# Patient Record
Sex: Female | Born: 1935 | Race: Black or African American | Hispanic: No | Smoking: Never smoker
Health system: Southern US, Community
[De-identification: ages and names within clinical notes are randomized; demographics above are authoritative.]

## PROBLEM LIST (undated history)

## (undated) DIAGNOSIS — I495 Sick sinus syndrome: Secondary | ICD-10-CM

## (undated) DIAGNOSIS — I2510 Atherosclerotic heart disease of native coronary artery without angina pectoris: Secondary | ICD-10-CM

## (undated) DIAGNOSIS — R7303 Prediabetes: Secondary | ICD-10-CM

## (undated) DIAGNOSIS — Z950 Presence of cardiac pacemaker: Secondary | ICD-10-CM

## (undated) DIAGNOSIS — K219 Gastro-esophageal reflux disease without esophagitis: Secondary | ICD-10-CM

## (undated) DIAGNOSIS — E785 Hyperlipidemia, unspecified: Secondary | ICD-10-CM

## (undated) DIAGNOSIS — K225 Diverticulum of esophagus, acquired: Secondary | ICD-10-CM

## (undated) DIAGNOSIS — I10 Essential (primary) hypertension: Secondary | ICD-10-CM

## (undated) DIAGNOSIS — F419 Anxiety disorder, unspecified: Secondary | ICD-10-CM

## (undated) DIAGNOSIS — M1990 Unspecified osteoarthritis, unspecified site: Secondary | ICD-10-CM

## (undated) HISTORY — DX: Sick sinus syndrome: I49.5

## (undated) HISTORY — DX: Essential (primary) hypertension: I10

## (undated) HISTORY — PX: CHOLECYSTECTOMY: SHX55

## (undated) HISTORY — DX: Anxiety disorder, unspecified: F41.9

## (undated) HISTORY — DX: Presence of cardiac pacemaker: Z95.0

## (undated) HISTORY — DX: Unspecified osteoarthritis, unspecified site: M19.90

## (undated) HISTORY — DX: Gastro-esophageal reflux disease without esophagitis: K21.9

## (undated) HISTORY — DX: Atherosclerotic heart disease of native coronary artery without angina pectoris: I25.10

## (undated) HISTORY — DX: Prediabetes: R73.03

## (undated) HISTORY — DX: Diverticulum of esophagus, acquired: K22.5

## (undated) HISTORY — PX: CYSTECTOMY: SUR359

## (undated) HISTORY — DX: Hyperlipidemia, unspecified: E78.5

---

## 2000-10-05 DIAGNOSIS — Z950 Presence of cardiac pacemaker: Secondary | ICD-10-CM

## 2000-10-05 HISTORY — PX: TOTAL ABDOMINAL HYSTERECTOMY: SHX209

## 2000-10-05 HISTORY — PX: PACEMAKER INSERTION: SHX728

## 2000-10-05 HISTORY — DX: Presence of cardiac pacemaker: Z95.0

## 2001-03-30 ENCOUNTER — Encounter: Payer: Self-pay | Admitting: Family Medicine

## 2001-03-30 ENCOUNTER — Ambulatory Visit (HOSPITAL_COMMUNITY): Admission: RE | Admit: 2001-03-30 | Discharge: 2001-03-30 | Payer: Self-pay | Admitting: Family Medicine

## 2001-04-01 ENCOUNTER — Ambulatory Visit (HOSPITAL_COMMUNITY): Admission: RE | Admit: 2001-04-01 | Discharge: 2001-04-01 | Payer: Self-pay | Admitting: Family Medicine

## 2001-04-01 ENCOUNTER — Encounter: Payer: Self-pay | Admitting: Family Medicine

## 2001-05-04 ENCOUNTER — Ambulatory Visit (HOSPITAL_COMMUNITY): Admission: RE | Admit: 2001-05-04 | Discharge: 2001-05-04 | Payer: Self-pay | Admitting: General Surgery

## 2001-06-16 ENCOUNTER — Ambulatory Visit (HOSPITAL_COMMUNITY): Admission: RE | Admit: 2001-06-16 | Discharge: 2001-06-16 | Payer: Self-pay | Admitting: Family Medicine

## 2001-06-16 ENCOUNTER — Encounter: Payer: Self-pay | Admitting: Family Medicine

## 2001-07-05 ENCOUNTER — Ambulatory Visit (HOSPITAL_COMMUNITY): Admission: RE | Admit: 2001-07-05 | Discharge: 2001-07-06 | Payer: Self-pay | Admitting: Family Medicine

## 2001-07-06 ENCOUNTER — Encounter: Payer: Self-pay | Admitting: Family Medicine

## 2001-08-09 ENCOUNTER — Other Ambulatory Visit: Admission: RE | Admit: 2001-08-09 | Discharge: 2001-08-09 | Payer: Self-pay | Admitting: Family Medicine

## 2001-10-05 HISTORY — PX: COLONOSCOPY: SHX174

## 2002-02-24 ENCOUNTER — Encounter: Payer: Self-pay | Admitting: Orthopaedic Surgery

## 2002-02-24 ENCOUNTER — Inpatient Hospital Stay (HOSPITAL_COMMUNITY): Admission: EM | Admit: 2002-02-24 | Discharge: 2002-02-28 | Payer: Self-pay | Admitting: Emergency Medicine

## 2002-02-24 ENCOUNTER — Encounter: Payer: Self-pay | Admitting: Emergency Medicine

## 2002-02-27 ENCOUNTER — Encounter: Payer: Self-pay | Admitting: Family Medicine

## 2002-07-18 ENCOUNTER — Encounter: Payer: Self-pay | Admitting: Family Medicine

## 2002-07-18 ENCOUNTER — Ambulatory Visit (HOSPITAL_COMMUNITY): Admission: RE | Admit: 2002-07-18 | Discharge: 2002-07-18 | Payer: Self-pay | Admitting: Family Medicine

## 2002-07-26 ENCOUNTER — Ambulatory Visit (HOSPITAL_COMMUNITY): Admission: RE | Admit: 2002-07-26 | Discharge: 2002-07-26 | Payer: Self-pay | Admitting: General Surgery

## 2003-02-06 ENCOUNTER — Encounter: Payer: Self-pay | Admitting: Family Medicine

## 2003-02-06 ENCOUNTER — Ambulatory Visit (HOSPITAL_COMMUNITY): Admission: RE | Admit: 2003-02-06 | Discharge: 2003-02-06 | Payer: Self-pay | Admitting: Family Medicine

## 2003-02-28 ENCOUNTER — Ambulatory Visit (HOSPITAL_COMMUNITY): Admission: RE | Admit: 2003-02-28 | Discharge: 2003-02-28 | Payer: Self-pay | Admitting: Family Medicine

## 2003-02-28 ENCOUNTER — Encounter: Payer: Self-pay | Admitting: Family Medicine

## 2003-07-20 ENCOUNTER — Encounter: Payer: Self-pay | Admitting: Family Medicine

## 2003-07-20 ENCOUNTER — Ambulatory Visit (HOSPITAL_COMMUNITY): Admission: RE | Admit: 2003-07-20 | Discharge: 2003-07-20 | Payer: Self-pay | Admitting: Family Medicine

## 2003-10-06 DIAGNOSIS — I2510 Atherosclerotic heart disease of native coronary artery without angina pectoris: Secondary | ICD-10-CM

## 2003-10-06 HISTORY — DX: Atherosclerotic heart disease of native coronary artery without angina pectoris: I25.10

## 2004-01-08 ENCOUNTER — Ambulatory Visit (HOSPITAL_COMMUNITY): Admission: RE | Admit: 2004-01-08 | Discharge: 2004-01-08 | Payer: Self-pay | Admitting: *Deleted

## 2004-06-24 ENCOUNTER — Ambulatory Visit (HOSPITAL_COMMUNITY): Admission: RE | Admit: 2004-06-24 | Discharge: 2004-06-24 | Payer: Self-pay | Admitting: Family Medicine

## 2004-08-13 ENCOUNTER — Inpatient Hospital Stay (HOSPITAL_COMMUNITY): Admission: EM | Admit: 2004-08-13 | Discharge: 2004-08-15 | Payer: Self-pay | Admitting: Emergency Medicine

## 2004-08-13 ENCOUNTER — Ambulatory Visit: Payer: Self-pay | Admitting: *Deleted

## 2004-08-14 ENCOUNTER — Ambulatory Visit: Payer: Self-pay | Admitting: *Deleted

## 2004-08-15 ENCOUNTER — Ambulatory Visit: Payer: Self-pay | Admitting: *Deleted

## 2004-08-15 ENCOUNTER — Ambulatory Visit: Payer: Self-pay | Admitting: Cardiology

## 2004-08-18 ENCOUNTER — Inpatient Hospital Stay (HOSPITAL_BASED_OUTPATIENT_CLINIC_OR_DEPARTMENT_OTHER): Admission: RE | Admit: 2004-08-18 | Discharge: 2004-08-18 | Payer: Self-pay | Admitting: Cardiology

## 2004-08-18 ENCOUNTER — Ambulatory Visit: Payer: Self-pay | Admitting: Cardiovascular Disease

## 2004-08-22 ENCOUNTER — Ambulatory Visit (HOSPITAL_COMMUNITY): Admission: RE | Admit: 2004-08-22 | Discharge: 2004-08-22 | Payer: Self-pay | Admitting: Family Medicine

## 2004-08-22 ENCOUNTER — Ambulatory Visit: Payer: Self-pay | Admitting: Cardiology

## 2004-10-17 ENCOUNTER — Ambulatory Visit: Payer: Self-pay | Admitting: Family Medicine

## 2004-10-25 ENCOUNTER — Ambulatory Visit: Payer: Self-pay | Admitting: Internal Medicine

## 2004-11-20 ENCOUNTER — Ambulatory Visit (HOSPITAL_COMMUNITY): Admission: RE | Admit: 2004-11-20 | Discharge: 2004-11-20 | Payer: Self-pay | Admitting: General Surgery

## 2004-11-20 LAB — HM COLONOSCOPY

## 2004-11-25 ENCOUNTER — Ambulatory Visit: Payer: Self-pay | Admitting: Internal Medicine

## 2004-12-29 ENCOUNTER — Ambulatory Visit: Payer: Self-pay | Admitting: Internal Medicine

## 2005-01-01 ENCOUNTER — Ambulatory Visit: Payer: Self-pay | Admitting: Family Medicine

## 2005-01-27 ENCOUNTER — Ambulatory Visit: Payer: Self-pay | Admitting: Internal Medicine

## 2005-03-06 ENCOUNTER — Ambulatory Visit: Payer: Self-pay | Admitting: Internal Medicine

## 2005-05-01 ENCOUNTER — Ambulatory Visit: Payer: Self-pay | Admitting: Family Medicine

## 2005-05-21 ENCOUNTER — Ambulatory Visit: Payer: Self-pay | Admitting: *Deleted

## 2005-06-24 ENCOUNTER — Ambulatory Visit: Payer: Self-pay | Admitting: Internal Medicine

## 2005-07-21 ENCOUNTER — Ambulatory Visit: Payer: Self-pay | Admitting: Family Medicine

## 2005-07-24 ENCOUNTER — Ambulatory Visit: Payer: Self-pay | Admitting: Internal Medicine

## 2005-08-10 ENCOUNTER — Ambulatory Visit: Payer: Self-pay | Admitting: Family Medicine

## 2005-08-11 ENCOUNTER — Ambulatory Visit (HOSPITAL_COMMUNITY): Admission: RE | Admit: 2005-08-11 | Discharge: 2005-08-11 | Payer: Self-pay | Admitting: Family Medicine

## 2005-08-28 ENCOUNTER — Ambulatory Visit (HOSPITAL_COMMUNITY): Admission: RE | Admit: 2005-08-28 | Discharge: 2005-08-28 | Payer: Self-pay | Admitting: Family Medicine

## 2005-09-01 ENCOUNTER — Ambulatory Visit: Payer: Self-pay | Admitting: Internal Medicine

## 2005-10-02 ENCOUNTER — Ambulatory Visit: Payer: Self-pay | Admitting: Internal Medicine

## 2005-10-05 HISTORY — PX: PACEMAKER PLACEMENT: SHX43

## 2005-11-09 ENCOUNTER — Ambulatory Visit: Payer: Self-pay | Admitting: Internal Medicine

## 2005-12-07 ENCOUNTER — Ambulatory Visit: Payer: Self-pay | Admitting: Internal Medicine

## 2005-12-08 ENCOUNTER — Ambulatory Visit: Payer: Self-pay | Admitting: Family Medicine

## 2005-12-17 ENCOUNTER — Encounter (HOSPITAL_COMMUNITY): Admission: RE | Admit: 2005-12-17 | Discharge: 2006-01-16 | Payer: Self-pay | Admitting: Family Medicine

## 2006-01-07 ENCOUNTER — Ambulatory Visit: Payer: Self-pay | Admitting: Internal Medicine

## 2006-02-17 ENCOUNTER — Ambulatory Visit: Payer: Self-pay | Admitting: Internal Medicine

## 2006-03-10 ENCOUNTER — Ambulatory Visit: Payer: Self-pay | Admitting: Family Medicine

## 2006-03-24 ENCOUNTER — Ambulatory Visit: Payer: Self-pay | Admitting: Internal Medicine

## 2006-03-25 ENCOUNTER — Ambulatory Visit: Payer: Self-pay | Admitting: Cardiology

## 2006-04-21 ENCOUNTER — Ambulatory Visit: Payer: Self-pay | Admitting: Internal Medicine

## 2006-06-10 ENCOUNTER — Ambulatory Visit: Payer: Self-pay | Admitting: Family Medicine

## 2006-06-17 ENCOUNTER — Ambulatory Visit: Payer: Self-pay | Admitting: Cardiology

## 2006-06-30 ENCOUNTER — Ambulatory Visit (HOSPITAL_COMMUNITY): Admission: RE | Admit: 2006-06-30 | Discharge: 2006-06-30 | Payer: Self-pay | Admitting: Internal Medicine

## 2006-06-30 ENCOUNTER — Ambulatory Visit: Payer: Self-pay | Admitting: Internal Medicine

## 2006-07-13 ENCOUNTER — Ambulatory Visit: Payer: Self-pay | Admitting: Family Medicine

## 2006-07-14 ENCOUNTER — Ambulatory Visit: Payer: Self-pay

## 2006-08-30 ENCOUNTER — Ambulatory Visit (HOSPITAL_COMMUNITY): Admission: RE | Admit: 2006-08-30 | Discharge: 2006-08-30 | Payer: Self-pay | Admitting: Family Medicine

## 2006-10-05 HISTORY — PX: ESOPHAGOGASTRODUODENOSCOPY: SHX1529

## 2006-10-06 ENCOUNTER — Ambulatory Visit: Payer: Self-pay | Admitting: Family Medicine

## 2006-11-17 ENCOUNTER — Ambulatory Visit: Payer: Self-pay | Admitting: Family Medicine

## 2006-11-24 ENCOUNTER — Ambulatory Visit: Payer: Self-pay | Admitting: Internal Medicine

## 2006-12-06 ENCOUNTER — Encounter: Payer: Self-pay | Admitting: Family Medicine

## 2006-12-06 LAB — CONVERTED CEMR LAB: Pap Smear: NORMAL

## 2006-12-23 ENCOUNTER — Other Ambulatory Visit: Admission: RE | Admit: 2006-12-23 | Discharge: 2006-12-23 | Payer: Self-pay | Admitting: Family Medicine

## 2006-12-23 ENCOUNTER — Encounter: Payer: Self-pay | Admitting: Family Medicine

## 2006-12-23 ENCOUNTER — Ambulatory Visit: Payer: Self-pay | Admitting: Family Medicine

## 2007-01-19 ENCOUNTER — Ambulatory Visit (HOSPITAL_COMMUNITY): Admission: RE | Admit: 2007-01-19 | Discharge: 2007-01-19 | Payer: Self-pay | Admitting: Family Medicine

## 2007-03-29 ENCOUNTER — Encounter: Payer: Self-pay | Admitting: Family Medicine

## 2007-03-29 LAB — CONVERTED CEMR LAB
AST: 24 units/L (ref 0–37)
Albumin: 4.2 g/dL (ref 3.5–5.2)
Alkaline Phosphatase: 62 units/L (ref 39–117)
BUN: 18 mg/dL (ref 6–23)
CO2: 27 meq/L (ref 19–32)
Calcium: 9.7 mg/dL (ref 8.4–10.5)
Chloride: 106 meq/L (ref 96–112)
Creatinine, Ser: 1 mg/dL (ref 0.40–1.20)
HDL: 75 mg/dL (ref >39)
Indirect Bilirubin: 0.4 mg/dL (ref 0.0–0.9)
LDL Cholesterol: 105 mg/dL — ABNORMAL HIGH (ref 0–99)
Total Bilirubin: 0.5 mg/dL (ref 0.3–1.2)

## 2007-04-07 ENCOUNTER — Ambulatory Visit: Payer: Self-pay | Admitting: Family Medicine

## 2007-04-14 ENCOUNTER — Ambulatory Visit: Payer: Self-pay | Admitting: Cardiology

## 2007-06-30 ENCOUNTER — Ambulatory Visit: Payer: Self-pay | Admitting: Family Medicine

## 2007-07-12 ENCOUNTER — Ambulatory Visit: Payer: Self-pay | Admitting: Family Medicine

## 2007-07-20 ENCOUNTER — Ambulatory Visit: Payer: Self-pay | Admitting: Gastroenterology

## 2007-08-03 ENCOUNTER — Encounter: Payer: Self-pay | Admitting: Family Medicine

## 2007-08-03 LAB — CONVERTED CEMR LAB
ALT: 17 units/L (ref 0–35)
AST: 23 units/L (ref 0–37)
Albumin: 4.3 g/dL (ref 3.5–5.2)
Alkaline Phosphatase: 64 units/L (ref 39–117)
Basophils Absolute: 0 10*3/uL (ref 0.0–0.1)
Basophils Relative: 0 % (ref 0–1)
CO2: 27 meq/L (ref 19–32)
Calcium: 10 mg/dL (ref 8.4–10.5)
Cholesterol: 223 mg/dL — ABNORMAL HIGH (ref 0–200)
Creatinine, Ser: 1 mg/dL (ref 0.40–1.20)
Eosinophils Absolute: 0.1 10*3/uL (ref 0.0–0.7)
Eosinophils Relative: 1 % (ref 0–5)
HCT: 39 % (ref 36.0–46.0)
HDL: 72 mg/dL (ref >39)
Hemoglobin: 12.8 g/dL (ref 12.0–15.0)
MCHC: 32.8 g/dL (ref 30.0–36.0)
MCV: 94.4 fL (ref 78.0–100.0)
Monocytes Absolute: 0.3 10*3/uL (ref 0.2–0.7)
Platelets: 198 10*3/uL (ref 150–400)
RDW: 13.9 % (ref 11.5–14.0)
Sodium: 144 meq/L (ref 135–145)
Total Bilirubin: 0.6 mg/dL (ref 0.3–1.2)
Total CHOL/HDL Ratio: 3.1
Triglycerides: 124 mg/dL (ref <150)

## 2007-08-04 ENCOUNTER — Ambulatory Visit (HOSPITAL_COMMUNITY): Admission: RE | Admit: 2007-08-04 | Discharge: 2007-08-04 | Payer: Self-pay | Admitting: Gastroenterology

## 2007-08-04 ENCOUNTER — Encounter: Payer: Self-pay | Admitting: Gastroenterology

## 2007-08-04 ENCOUNTER — Ambulatory Visit: Payer: Self-pay | Admitting: Gastroenterology

## 2007-08-05 ENCOUNTER — Ambulatory Visit (HOSPITAL_COMMUNITY): Admission: RE | Admit: 2007-08-05 | Discharge: 2007-08-05 | Payer: Self-pay | Admitting: Gastroenterology

## 2007-08-23 ENCOUNTER — Ambulatory Visit: Payer: Self-pay | Admitting: Gastroenterology

## 2007-11-30 ENCOUNTER — Ambulatory Visit: Payer: Self-pay | Admitting: Family Medicine

## 2007-11-30 LAB — CONVERTED CEMR LAB
AST: 21 units/L (ref 0–37)
Alkaline Phosphatase: 72 units/L (ref 39–117)
BUN: 18 mg/dL (ref 6–23)
CO2: 27 meq/L (ref 19–32)
Calcium: 10.4 mg/dL (ref 8.4–10.5)
Chloride: 104 meq/L (ref 96–112)
Creatinine, Ser: 0.98 mg/dL (ref 0.40–1.20)
Indirect Bilirubin: 0.4 mg/dL (ref 0.0–0.9)
LDL Cholesterol: 96 mg/dL (ref 0–99)
Total Bilirubin: 0.5 mg/dL (ref 0.3–1.2)
Triglycerides: 76 mg/dL (ref <150)

## 2008-01-24 ENCOUNTER — Ambulatory Visit (HOSPITAL_COMMUNITY): Admission: RE | Admit: 2008-01-24 | Discharge: 2008-01-24 | Payer: Self-pay | Admitting: Family Medicine

## 2008-01-31 ENCOUNTER — Ambulatory Visit: Payer: Self-pay | Admitting: Cardiology

## 2008-01-31 ENCOUNTER — Observation Stay (HOSPITAL_COMMUNITY): Admission: EM | Admit: 2008-01-31 | Discharge: 2008-02-02 | Payer: Self-pay | Admitting: Emergency Medicine

## 2008-02-09 ENCOUNTER — Ambulatory Visit: Payer: Self-pay | Admitting: Family Medicine

## 2008-02-15 ENCOUNTER — Encounter: Payer: Self-pay | Admitting: Family Medicine

## 2008-02-15 DIAGNOSIS — I10 Essential (primary) hypertension: Secondary | ICD-10-CM | POA: Insufficient documentation

## 2008-02-15 DIAGNOSIS — M1990 Unspecified osteoarthritis, unspecified site: Secondary | ICD-10-CM | POA: Insufficient documentation

## 2008-02-15 DIAGNOSIS — E785 Hyperlipidemia, unspecified: Secondary | ICD-10-CM | POA: Insufficient documentation

## 2008-02-15 DIAGNOSIS — F411 Generalized anxiety disorder: Secondary | ICD-10-CM | POA: Insufficient documentation

## 2008-02-16 ENCOUNTER — Ambulatory Visit: Payer: Self-pay | Admitting: Cardiovascular Disease

## 2008-03-14 ENCOUNTER — Ambulatory Visit: Payer: Self-pay | Admitting: Cardiology

## 2008-05-24 ENCOUNTER — Ambulatory Visit: Payer: Self-pay | Admitting: Family Medicine

## 2008-05-24 ENCOUNTER — Telehealth: Payer: Self-pay | Admitting: Family Medicine

## 2008-06-14 DIAGNOSIS — K219 Gastro-esophageal reflux disease without esophagitis: Secondary | ICD-10-CM | POA: Insufficient documentation

## 2008-06-14 DIAGNOSIS — R1310 Dysphagia, unspecified: Secondary | ICD-10-CM | POA: Insufficient documentation

## 2008-06-14 DIAGNOSIS — K225 Diverticulum of esophagus, acquired: Secondary | ICD-10-CM | POA: Insufficient documentation

## 2008-06-18 ENCOUNTER — Encounter: Payer: Self-pay | Admitting: Family Medicine

## 2008-06-19 ENCOUNTER — Encounter: Payer: Self-pay | Admitting: Family Medicine

## 2008-06-20 ENCOUNTER — Ambulatory Visit: Payer: Self-pay | Admitting: Family Medicine

## 2008-07-18 ENCOUNTER — Ambulatory Visit: Payer: Self-pay | Admitting: Family Medicine

## 2008-07-18 DIAGNOSIS — B379 Candidiasis, unspecified: Secondary | ICD-10-CM | POA: Insufficient documentation

## 2008-07-31 ENCOUNTER — Encounter: Payer: Self-pay | Admitting: Family Medicine

## 2008-08-23 ENCOUNTER — Ambulatory Visit: Payer: Self-pay | Admitting: Cardiology

## 2008-08-31 ENCOUNTER — Ambulatory Visit: Payer: Self-pay | Admitting: Cardiology

## 2008-08-31 ENCOUNTER — Encounter: Payer: Self-pay | Admitting: Family Medicine

## 2008-09-03 ENCOUNTER — Encounter: Payer: Self-pay | Admitting: Cardiology

## 2008-09-03 ENCOUNTER — Ambulatory Visit: Payer: Self-pay | Admitting: Cardiology

## 2008-09-03 ENCOUNTER — Ambulatory Visit (HOSPITAL_COMMUNITY): Admission: RE | Admit: 2008-09-03 | Discharge: 2008-09-03 | Payer: Self-pay | Admitting: Cardiology

## 2008-09-17 ENCOUNTER — Encounter: Payer: Self-pay | Admitting: Family Medicine

## 2008-09-18 LAB — CONVERTED CEMR LAB
Albumin: 4.5 g/dL (ref 3.5–5.2)
CO2: 26 meq/L (ref 19–32)
Chloride: 105 meq/L (ref 96–112)
HDL: 79 mg/dL (ref >39)
LDL Cholesterol: 124 mg/dL — ABNORMAL HIGH (ref 0–99)
Sodium: 141 meq/L (ref 135–145)
Total Bilirubin: 0.5 mg/dL (ref 0.3–1.2)
Total CHOL/HDL Ratio: 2.8
VLDL: 15 mg/dL (ref 0–40)

## 2008-09-20 ENCOUNTER — Ambulatory Visit: Payer: Self-pay | Admitting: Family Medicine

## 2008-09-20 DIAGNOSIS — M1610 Unilateral primary osteoarthritis, unspecified hip: Secondary | ICD-10-CM | POA: Insufficient documentation

## 2008-09-20 DIAGNOSIS — J309 Allergic rhinitis, unspecified: Secondary | ICD-10-CM | POA: Insufficient documentation

## 2008-09-25 ENCOUNTER — Ambulatory Visit: Payer: Self-pay | Admitting: Cardiology

## 2008-10-04 ENCOUNTER — Ambulatory Visit: Payer: Self-pay | Admitting: Cardiology

## 2008-11-01 ENCOUNTER — Ambulatory Visit: Payer: Self-pay | Admitting: Cardiology

## 2008-12-06 LAB — CONVERTED CEMR LAB
ALT: 15 units/L (ref 0–35)
AST: 19 units/L (ref 0–37)
Albumin: 4.4 g/dL (ref 3.5–5.2)
Bilirubin, Direct: 0.1 mg/dL (ref 0.0–0.3)
Cholesterol: 193 mg/dL (ref 0–200)
HDL: 88 mg/dL (ref >39)
Total CHOL/HDL Ratio: 2.2
Triglycerides: 83 mg/dL (ref <150)

## 2008-12-10 ENCOUNTER — Encounter: Payer: Self-pay | Admitting: Family Medicine

## 2008-12-10 ENCOUNTER — Ambulatory Visit: Payer: Self-pay | Admitting: Family Medicine

## 2008-12-10 ENCOUNTER — Other Ambulatory Visit: Admission: RE | Admit: 2008-12-10 | Discharge: 2008-12-10 | Payer: Self-pay | Admitting: Family Medicine

## 2008-12-10 DIAGNOSIS — E049 Nontoxic goiter, unspecified: Secondary | ICD-10-CM | POA: Insufficient documentation

## 2008-12-10 LAB — CONVERTED CEMR LAB: OCCULT 1: NEGATIVE

## 2008-12-12 ENCOUNTER — Ambulatory Visit (HOSPITAL_COMMUNITY): Admission: RE | Admit: 2008-12-12 | Discharge: 2008-12-12 | Payer: Self-pay | Admitting: Family Medicine

## 2008-12-13 ENCOUNTER — Encounter: Payer: Self-pay | Admitting: Family Medicine

## 2008-12-15 DIAGNOSIS — G3184 Mild cognitive impairment, so stated: Secondary | ICD-10-CM | POA: Insufficient documentation

## 2008-12-15 DIAGNOSIS — R413 Other amnesia: Secondary | ICD-10-CM | POA: Insufficient documentation

## 2009-01-18 ENCOUNTER — Encounter (INDEPENDENT_AMBULATORY_CARE_PROVIDER_SITE_OTHER): Payer: Self-pay | Admitting: *Deleted

## 2009-01-25 ENCOUNTER — Ambulatory Visit (HOSPITAL_COMMUNITY): Admission: RE | Admit: 2009-01-25 | Discharge: 2009-01-25 | Payer: Self-pay | Admitting: Family Medicine

## 2009-01-25 ENCOUNTER — Encounter: Payer: Self-pay | Admitting: Family Medicine

## 2009-01-28 ENCOUNTER — Encounter: Payer: Self-pay | Admitting: Family Medicine

## 2009-01-29 ENCOUNTER — Encounter: Payer: Self-pay | Admitting: Family Medicine

## 2009-01-30 ENCOUNTER — Encounter: Payer: Self-pay | Admitting: Family Medicine

## 2009-01-31 ENCOUNTER — Ambulatory Visit (HOSPITAL_COMMUNITY): Admission: RE | Admit: 2009-01-31 | Discharge: 2009-01-31 | Payer: Self-pay | Admitting: Family Medicine

## 2009-02-12 ENCOUNTER — Ambulatory Visit: Payer: Self-pay | Admitting: Family Medicine

## 2009-03-29 ENCOUNTER — Encounter: Payer: Self-pay | Admitting: Family Medicine

## 2009-04-02 ENCOUNTER — Ambulatory Visit: Payer: Self-pay | Admitting: Internal Medicine

## 2009-04-02 ENCOUNTER — Encounter: Payer: Self-pay | Admitting: Internal Medicine

## 2009-04-24 ENCOUNTER — Encounter: Payer: Self-pay | Admitting: Family Medicine

## 2009-04-24 ENCOUNTER — Observation Stay (HOSPITAL_COMMUNITY): Admission: EM | Admit: 2009-04-24 | Discharge: 2009-04-25 | Payer: Self-pay | Admitting: Emergency Medicine

## 2009-05-15 ENCOUNTER — Encounter (INDEPENDENT_AMBULATORY_CARE_PROVIDER_SITE_OTHER): Payer: Self-pay | Admitting: *Deleted

## 2009-05-21 ENCOUNTER — Ambulatory Visit: Payer: Self-pay | Admitting: Cardiology

## 2009-05-21 ENCOUNTER — Encounter (INDEPENDENT_AMBULATORY_CARE_PROVIDER_SITE_OTHER): Payer: Self-pay | Admitting: *Deleted

## 2009-05-21 LAB — CONVERTED CEMR LAB
ALT: 16 units/L
ALT: 16 units/L (ref 0–35)
Albumin: 4.4 g/dL
Alkaline Phosphatase: 58 units/L
CO2: 27 meq/L
CO2: 27 meq/L (ref 19–32)
Creatinine, Ser: 1.02 mg/dL (ref 0.40–1.20)
Magnesium: 2 mg/dL (ref 1.5–2.5)
Sodium: 142 meq/L
Total Bilirubin: 0.4 mg/dL (ref 0.3–1.2)
Total Protein: 7.6 g/dL

## 2009-06-18 ENCOUNTER — Telehealth (INDEPENDENT_AMBULATORY_CARE_PROVIDER_SITE_OTHER): Payer: Self-pay | Admitting: *Deleted

## 2009-07-03 ENCOUNTER — Ambulatory Visit: Payer: Self-pay | Admitting: Gastroenterology

## 2009-07-05 ENCOUNTER — Ambulatory Visit: Payer: Self-pay | Admitting: Family Medicine

## 2009-07-05 DIAGNOSIS — R5381 Other malaise: Secondary | ICD-10-CM | POA: Insufficient documentation

## 2009-07-05 DIAGNOSIS — R5383 Other fatigue: Secondary | ICD-10-CM

## 2009-07-05 DIAGNOSIS — M7580 Other shoulder lesions, unspecified shoulder: Secondary | ICD-10-CM

## 2009-07-05 DIAGNOSIS — M25819 Other specified joint disorders, unspecified shoulder: Secondary | ICD-10-CM | POA: Insufficient documentation

## 2009-07-05 DIAGNOSIS — H547 Unspecified visual loss: Secondary | ICD-10-CM | POA: Insufficient documentation

## 2009-07-05 DIAGNOSIS — M533 Sacrococcygeal disorders, not elsewhere classified: Secondary | ICD-10-CM | POA: Insufficient documentation

## 2009-07-10 ENCOUNTER — Encounter: Payer: Self-pay | Admitting: Family Medicine

## 2009-07-17 ENCOUNTER — Ambulatory Visit (HOSPITAL_COMMUNITY): Admission: RE | Admit: 2009-07-17 | Discharge: 2009-07-17 | Payer: Self-pay | Admitting: Gastroenterology

## 2009-07-18 ENCOUNTER — Encounter: Payer: Self-pay | Admitting: Gastroenterology

## 2009-09-12 ENCOUNTER — Ambulatory Visit: Payer: Self-pay | Admitting: Cardiology

## 2009-10-08 ENCOUNTER — Ambulatory Visit: Payer: Self-pay | Admitting: Family Medicine

## 2009-10-08 DIAGNOSIS — D179 Benign lipomatous neoplasm, unspecified: Secondary | ICD-10-CM | POA: Insufficient documentation

## 2009-10-10 LAB — CONVERTED CEMR LAB
Albumin: 4.3 g/dL (ref 3.5–5.2)
CO2: 28 meq/L (ref 19–32)
Chloride: 102 meq/L (ref 96–112)
Creatinine, Ser: 0.92 mg/dL (ref 0.40–1.20)
HDL: 71 mg/dL (ref >39)
LDL Cholesterol: 117 mg/dL — ABNORMAL HIGH (ref 0–99)
TSH: 2.917 microintl units/mL (ref 0.350–4.500)
Total Bilirubin: 0.4 mg/dL (ref 0.3–1.2)
Total CHOL/HDL Ratio: 2.8
VLDL: 14 mg/dL (ref 0–40)

## 2009-10-17 ENCOUNTER — Ambulatory Visit (HOSPITAL_COMMUNITY): Admission: RE | Admit: 2009-10-17 | Discharge: 2009-10-17 | Payer: Self-pay | Admitting: General Surgery

## 2009-10-17 ENCOUNTER — Telehealth: Payer: Self-pay | Admitting: Family Medicine

## 2009-10-17 ENCOUNTER — Encounter: Payer: Self-pay | Admitting: Family Medicine

## 2009-10-29 ENCOUNTER — Telehealth: Payer: Self-pay | Admitting: Family Medicine

## 2009-11-08 ENCOUNTER — Encounter: Payer: Self-pay | Admitting: Gastroenterology

## 2009-12-09 ENCOUNTER — Encounter: Payer: Self-pay | Admitting: Family Medicine

## 2010-01-02 ENCOUNTER — Ambulatory Visit: Payer: Self-pay | Admitting: Gastroenterology

## 2010-01-02 DIAGNOSIS — R197 Diarrhea, unspecified: Secondary | ICD-10-CM | POA: Insufficient documentation

## 2010-01-06 ENCOUNTER — Telehealth (INDEPENDENT_AMBULATORY_CARE_PROVIDER_SITE_OTHER): Payer: Self-pay

## 2010-01-08 ENCOUNTER — Encounter (INDEPENDENT_AMBULATORY_CARE_PROVIDER_SITE_OTHER): Payer: Self-pay

## 2010-02-03 ENCOUNTER — Ambulatory Visit (HOSPITAL_COMMUNITY): Admission: RE | Admit: 2010-02-03 | Discharge: 2010-02-03 | Payer: Self-pay | Admitting: Family Medicine

## 2010-02-05 ENCOUNTER — Ambulatory Visit: Payer: Self-pay | Admitting: Family Medicine

## 2010-02-05 DIAGNOSIS — M25519 Pain in unspecified shoulder: Secondary | ICD-10-CM | POA: Insufficient documentation

## 2010-02-19 ENCOUNTER — Encounter (INDEPENDENT_AMBULATORY_CARE_PROVIDER_SITE_OTHER): Payer: Self-pay | Admitting: *Deleted

## 2010-02-19 LAB — CONVERTED CEMR LAB
ALT: 14 units/L
AST: 18 units/L
BUN: 18 mg/dL
Basophils Absolute: 0 10*3/uL
Basophils Relative: 0 %
Bilirubin, Direct: 0.1 mg/dL
Calcium: 9.6 mg/dL
Cholesterol: 200 mg/dL
Creatinine, Ser: 1 mg/dL
HCT: 37.5 %
HDL: 99 mg/dL
LDL Cholesterol: 89 mg/dL
Lymphocytes Relative: 2.8 %
MCHC: 32 g/dL
MCV: 91.7 fL
Platelets: 216 10*3/uL
RDW: 14 %
Triglycerides: 62 mg/dL
WBC: 5.1 10*3/uL

## 2010-02-21 LAB — CONVERTED CEMR LAB
ALT: 14 units/L (ref 0–35)
Albumin: 4.5 g/dL (ref 3.5–5.2)
Bilirubin, Direct: 0.1 mg/dL (ref 0.0–0.3)
CO2: 28 meq/L (ref 19–32)
Cholesterol: 200 mg/dL (ref 0–200)
Glucose, Bld: 93 mg/dL (ref 70–99)
HDL: 99 mg/dL (ref >39)
Hemoglobin: 12 g/dL (ref 12.0–15.0)
Lymphocytes Relative: 38 % (ref 12–46)
Monocytes Absolute: 0.3 10*3/uL (ref 0.1–1.0)
Monocytes Relative: 5 % (ref 3–12)
Neutro Abs: 2.8 10*3/uL (ref 1.7–7.7)
Potassium: 3.9 meq/L (ref 3.5–5.3)
RBC: 4.09 M/uL (ref 3.87–5.11)
Sodium: 141 meq/L (ref 135–145)
Total Bilirubin: 0.5 mg/dL (ref 0.3–1.2)
Total CHOL/HDL Ratio: 2
VLDL: 12 mg/dL (ref 0–40)
Vit D, 25-Hydroxy: 37 ng/mL (ref 30–89)
WBC: 5.1 10*3/uL (ref 4.0–10.5)

## 2010-06-04 ENCOUNTER — Encounter (INDEPENDENT_AMBULATORY_CARE_PROVIDER_SITE_OTHER): Payer: Self-pay | Admitting: *Deleted

## 2010-06-11 ENCOUNTER — Ambulatory Visit: Payer: Self-pay | Admitting: Cardiology

## 2010-06-11 ENCOUNTER — Encounter: Payer: Self-pay | Admitting: Internal Medicine

## 2010-07-09 ENCOUNTER — Ambulatory Visit: Payer: Self-pay | Admitting: Family Medicine

## 2010-07-14 ENCOUNTER — Telehealth: Payer: Self-pay | Admitting: Cardiology

## 2010-07-14 ENCOUNTER — Observation Stay (HOSPITAL_COMMUNITY): Admission: EM | Admit: 2010-07-14 | Discharge: 2010-07-15 | Payer: Self-pay | Admitting: Emergency Medicine

## 2010-07-14 ENCOUNTER — Ambulatory Visit: Payer: Self-pay | Admitting: Cardiology

## 2010-07-14 ENCOUNTER — Encounter (INDEPENDENT_AMBULATORY_CARE_PROVIDER_SITE_OTHER): Payer: Self-pay | Admitting: *Deleted

## 2010-07-14 LAB — CONVERTED CEMR LAB
CO2: 32 meq/L (ref 19–32)
Calcium: 10.4 mg/dL (ref 8.4–10.5)
Chloride: 102 meq/L (ref 96–112)
Creatinine, Ser: 1.04 mg/dL (ref 0.40–1.20)
Glucose, Bld: 109 mg/dL — ABNORMAL HIGH (ref 70–99)

## 2010-07-25 ENCOUNTER — Ambulatory Visit: Payer: Self-pay | Admitting: Cardiology

## 2010-07-25 ENCOUNTER — Encounter (HOSPITAL_COMMUNITY)
Admission: RE | Admit: 2010-07-25 | Discharge: 2010-08-24 | Payer: Self-pay | Source: Home / Self Care | Admitting: Cardiology

## 2010-09-30 LAB — CONVERTED CEMR LAB
BUN: 19 mg/dL (ref 6–23)
CO2: 29 meq/L (ref 19–32)
Calcium: 9.5 mg/dL (ref 8.4–10.5)
Chloride: 107 meq/L (ref 96–112)
Creatinine, Ser: 0.96 mg/dL (ref 0.40–1.20)

## 2010-10-25 ENCOUNTER — Encounter: Payer: Self-pay | Admitting: Family Medicine

## 2010-10-25 ENCOUNTER — Encounter: Payer: Self-pay | Admitting: Cardiology

## 2010-10-26 ENCOUNTER — Encounter: Payer: Self-pay | Admitting: General Surgery

## 2010-11-04 NOTE — Progress Notes (Signed)
Summary: CALL  Phone Note Call from Patient   Summary of Call: WENT TO DR. Lovell Sheehan AND HE SEND HER TO DR Laural Benes AND THEN HE SENT HER TO DR Manson Passey AND HE ACTED LIKE HE DID NOT WANT TO TACKLE IT SO SHE HAS CALLED HERE AND GOT DR, Renette Butters # JUST WANTED YOU TO KNOW Initial call taken by: Lind Guest,  October 29, 2009 10:32 AM  Follow-up for Phone Call        noted, tell heri hope that she gets some help and relief, I believe she will if it is bothering her Follow-up by: [name] MD,  October 29, 2009 12:42 PM

## 2010-11-04 NOTE — Letter (Signed)
Summary: [facility], [facility] at [employer]  [address], [place] [address]   Phone: [phone]  Fax: [phone]     June 11, 2010 MRN: [id number]   Candace Cervantes [address] [place], [place]  [address]      YOUR LAB WORK IS DUE  July 14, 2010 _________________________________________  Please go to [facility], located across the street from [facility] on the second floor.  Hours are Monday - Friday 7am until 7:30pm         Saturday 8am until 12noon    __  DO NOT EAT OR DRINK AFTER MIDNIGHT EVENING PRIOR TO LABWORK  _X_ YOUR LABWORK IS NOT FASTING --YOU MAY EAT PRIOR TO LABWORK

## 2010-11-04 NOTE — Progress Notes (Signed)
Summary: phone note/per lab pt didnot complete order  Phone Note Other Incoming   Caller: Verlon Au @ [place] lab Summary of Call: Verlon Au called from the lab. Said pt dropped off stool specimens. But she got goen before Verlon Au could let her know there was no sample for Giardia. She tried to call pt and could not get her. I have tried, no answer. ( Per Verlon Au, pt can come by the lab for another container to do the specimen.) Initial call taken by: Cloria Spring LPN,  January 06, 2010 2:48 PM     Appended Document: phone note/per lab pt didnot complete order Pt informed. She will try to get specimen bottle from lab today or tomorrow so she can complete.

## 2010-11-04 NOTE — Progress Notes (Signed)
Summary: Pt. reports Chest Pain  Phone Note Call from Patient   Caller: Lawerance Bach Reason for Call: Talk to Doctor Summary of Call: Dorothyann Gibbs called to ask what the MD would like her to do if she was having active chest pains.  I instructed Vendea to go call 911 or go to the Ed.  She said she would go to the ED. Initial call taken by: Alexis Goodell  Follow-up for Phone Call        Noted. Follow-up by: Kathlen Brunswick, MD, [facility],  July 14, 2010 4:03 PM

## 2010-11-04 NOTE — Assessment & Plan Note (Signed)
Summary: DYSPHAGIA, LOOSE STOOLS   Visit Type:  Follow-up Visit Primary Care Provider:  Lodema Hong, M.D.  Chief Complaint:  difficulty swallowing and "DYSENTERY".  History of Present Illness: Here about her throat. Felt couldn't swallow food and she stopped her ASA and she got better. Has a knot behind left ear. Dx: arthritis in shoulders and jaw. Can't take big pills. One of the pills says don't crush it. Has a OTC pill and can't crush. Everything she is taking is OTC. Feels like pills get in esophagus and stops.   Having loose stools for a week. No blood in stool. No abd pain. No fever or chills. Well water. Eats ice cream every night for 6-8 mos. No travel. No NH or hospital visits.  Current Medications (verified): 1)  Klor-Con M10 10 Meq  Tbcr (Potassium Chloride Crys Cr) .... Take 1 Tablet By Mouth Once A Day 2)  Meclizine Hcl 25 Mg  Tabs (Meclizine Hcl) .... As Needed 3)  Oscal 500/200 D-3 500-200 Mg-Unit  Tabs (Calcium-Vitamin D) .... Take 1 Tablet By Mouth Three Times A Day 4)  Aspirin 81 Mg  Tbec (Aspirin) .... Once Weekly 5)  Centrum   Liqd (Multiple Vitamins-Minerals) .... One Tablespoon Daily 6)  Triamterene-Hctz 37.5-25 Mg  Caps (Triamterene-Hctz) .... Take One and One Half Tablet By Mouth Once Daily 7)  Omeprazole 20 Mg  Tbec (Omeprazole) .... Take 1 Tablet By Mouth Once A Day 8)  Lovastatin 40 Mg Tabs (Lovastatin) .... Take 1 Tab By Mouth At Bedtime 9)  Nitroglycerin 0.4 Mg  Subl (Nitroglycerin) .... One Tablet Under Tounge At Onset of Chest Pains, Repeat Every Five Min As Needed 10)  Alendronate Sodium 70 Mg Tabs (Alendronate Sodium) .... Take One Tab Once Weekly 11)  Exelon 9.5 Mg/24hr Pt24 (Rivastigmine) .... 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  Past Surgical History: Last updated: 05/21/2009 TAH 2002 Cholecystectomy in 1987  Cyst removed from back of the neck  Permanent pacemaker placement in 2002, replaced in 2007  Review of Systems       2008: 185 lbs  Vital Signs:  Patient profile:   77 year old female Menstrual status:  hysterectomy Height:      70 inches Weight:      184.50 pounds BMI:     26.57 Temp:     98.0 degrees F oral Pulse rate:   64 / minute BP sitting:   140 / 62  (left arm) Cuff size:   regular  Vitals Entered By: Cloria Spring LPN (January 02, 2010 8:55 AM)  Physical Exam  General:  Well developed, well nourished, no acute distress. Head:  Normocephalic and atraumatic. Eyes:  PERRLA, no icterus. Mouth:  No deformity or lesions. Neck:  Supple; no masses. Lungs:  Clear throughout to auscultation. Heart:  Regular rate and rhythm; no murmurs. Abdomen:  Soft, nontender and nondistended.  Normal bowel sounds. Extremities:  No edema or deformities noted. Neurologic:  Alert and  oriented x4;  grossly normal neurologically.  Impression & Recommendations:  Problem # 1:  DYSPHAGIA UNSPECIFIED (ICD-787.20) Assessment Unchanged  Pt having difficulty with pills only. Concerned about controlling head and neck  pain. TAKE iBUPROFEN TWICE DAILY FOR 14 DAYS then as needed. CRUSH AND PUT IN APPLE SAUCE OR ICE CREAM. Use REG Tylenol 2 three times a day for 14 days then as needed. USE HEATING PAD TO NECK THREE TIMES A DAY. Return visit in 3 months.  Orders: Est. Patient Level V (95188)  Problem # 2:  DIARRHEA (ICD-787.91) likely 2o to lactose intolerance. Differential diagnosis includes giardiasis, or CDIFF colitis. MINIMIZE YOUR DAIRY INTAKE FOR 7 DAYS. Get  stool studies. Use IMODIUM as needed loose stools. OPV in 3 mos.  cc: pcp AND DR. Lazarus Salines  Orders: T-Stool Giardia / Crypto- EIA (41660) T-Fecal WBC (63016-01093) T-Culture, C-Diff Toxin A/B (23557-32202) T-Culture, C-Diff Toxin A/B (54270-62376) T-Culture, C-Diff Toxin A/B (28315-17616) Est. Patient Level V ([address]371)  Patient Instructions: 1)  TAKE iBUPROFEN TWICE DAILY FOR 14 DAYS then as needed. 2)  Use REG Tylenol 2 three times a day for 14 days then as needed. 3)  USE HEATING PAD TO NECK THREE TIMES A DAY. 4)  MINIMIZE YOUR DAIRY INTAKE FOR 7 DAYS. 5)  Use IMODIUM as needed loose stools. 6)  Submit stool studies. 7)  Return visit in 3 months. 8)  The medication list was reviewed and reconciled.  All changed / newly prescribed medications were explained.  A complete medication list was provided to the patient / caregiver. Prescriptions: IBUPROFEN 600 MG TABS (IBUPROFEN) 1 by mouth two times a day for 14 days THEN PRN. CRUSH PILL AND PUT IN APPLE SAUCE OR ICE CREAM  #60 x 5   Entered and Authorized by:   West Bali MD   Signed by:   West Bali MD on 01/02/2010   Method used:   Electronically to        [facility] [phone]* (retail)       [address] [place], [place]  [address]       Ph: [phone]       Fax: [phone]   RxID:   [id number]       Appended Document: DYSPHAGIA, LOOSE STOOLS Seen and evaluated by Dr. Lazarus Salines FEB 2011 and Dentist-Cervical arthritis, fluid collection over L TMJ.

## 2010-11-04 NOTE — Cardiovascular Report (Signed)
Summary: Office Visit   Office Visit   Imported By: Roderic Ovens 07/01/2010 15:24:06  _____________________________________________________________________  External Attachment:    Type:   Image     Comment:   External Document

## 2010-11-04 NOTE — Letter (Signed)
Summary: [place] [facility], agricultural at [employer]  [address] [place] [address]   Phone: [phone]  Fax: [phone]     July 14, 2010 MRN: [id number]   Candace Cervantes [address] [place], [place]  [address]      YOUR LAB WORK IS DUE   September 12, 2010  Please go to [facility], located across the street from [facility] on the second floor.  Hours are Monday - Friday 7am until 7:30pm         Saturday 8am until 12noon      __ YOUR LABWORK IS NOT FASTING --YOU MAY EAT PRIOR TO LABWORK

## 2010-11-04 NOTE — Procedures (Signed)
Summary: Cardiology Device Clinic   Allergies: No Known Drug Allergies  PPM Specifications Following MD:  Lewayne Bunting, MD     PPM Vendor:  [employer]     PPM Model Number:  [phone]     PPM Serial Number:  [id number] DOI:  06/30/2006     PPM Implanting MD:  Lewayne Bunting, MD  Lead 1    Location: RA     DOI: 08/30/2001     Model #: 1342T     Serial #: [id number]     Status: active Lead 2    Location: RV     DOI: 08/30/2001     Model #: 1346T     Serial #: [id number]     Status: active   Indications:  BRADY WITH PAF    PPM Follow Up Remote Check?  No Battery Voltage:  2.81 V     Battery Est. Longevity:  6 years     Pacer Dependent:  No       PPM Device Measurements Atrium  Amplitude: 2.0 mV, Impedance: 405 ohms, Threshold: 0.5 V at 0.5 msec Right Ventricle  Amplitude: 6.8 mV, Impedance: 489 ohms, Threshold: 1.0 V at 0.5 msec  Episodes MS Episodes:  8     Percent Mode Switch:  <1%     Coumadin:  No Atrial Pacing:  62%     Ventricular Pacing:  <1%  Parameters Mode:  DDIR     Lower Rate Limit:  60     Upper Rate Limit:  105 Paced AV Delay:  350     Next Cardiology Appt Due:  12/11/2010 Tech Comments:  Ms. Waldo was checked today during her visit with Dr. Dietrich Pates.  No parameter changes.  Device function normal.  She has had minimal A-fib with the longest episode lasting 7 minutes.  ROV 6 months with [name] in [place]. Altha Harm, LPN  June 11, 2010 2:01 PM

## 2010-11-04 NOTE — Miscellaneous (Signed)
Summary: LABS CMP,MAGNESIUM,05/21/2009  Clinical Lists Changes  Observations: Added new observation of MAGNESIUM: 2.0 mg/dL (96/29/5284 13:24) Added new observation of CALCIUM: 10.3 mg/dL (40/07/2724 36:64) Added new observation of ALBUMIN: 4.4 g/dL (40/34/7425 95:63) Added new observation of PROTEIN, TOT: 7.6 g/dL (87/56/4332 95:18) Added new observation of SGPT (ALT): 16 units/L (05/21/2009 15:00) Added new observation of SGOT (AST): 23 units/L (05/21/2009 15:00) Added new observation of ALK PHOS: 58 units/L (05/21/2009 15:00) Added new observation of CREATININE: 1.02 mg/dL (84/16/6063 01:60) Added new observation of BUN: 17 mg/dL (10/93/2355 73:22) Added new observation of BG RANDOM: 98 mg/dL (02/54/2706 23:76) Added new observation of CO2 PLSM/SER: 27 meq/L (05/21/2009 15:00) Added new observation of CL SERUM: 104 meq/L (05/21/2009 15:00) Added new observation of K SERUM: 4.4 meq/L (05/21/2009 15:00) Added new observation of NA: 142 meq/L (05/21/2009 15:00)

## 2010-11-04 NOTE — Assessment & Plan Note (Signed)
Summary: f1y   Visit Type:  Follow-up Primary Candace Cervantes:  Candace Overman MD   History of Present Illness: [name] returns to the office as scheduled for continued assessment and treatment of conduction system disease, hypertension, hyperlipidemia and nonobstructive coronary disease in 2006 following stent placement to the circumflex a year earlier.  She is doing generally well with no chest discomfort, no dyspnea on exertion, no orthopnea and no PND.  She has had no significant edema.  Treatment has been initiated for dementia, but her functional status continues to be fairly good.  She is having difficulty swallowing her potassium tablets due to long-standing dysphagia.  She has had some decline in her sense of well-being and exercise tolerance, which she attributes to inadequate potassium replacement.      Current Medications (verified): 1)  Potassium Chloride 20 Meq/64ml (10%) Liqd (Potassium Chloride) .... Take 20 Meq. By Mouth Once Daily 2)  Meclizine Hcl 25 Mg  Tabs (Meclizine Hcl) .... As Needed 3)  Oscal 500/200 D-3 500-200 Mg-Unit  Tabs (Calcium-Vitamin D) .... Take 1 Tablet By Mouth Three Times A Day 4)  Centrum   Liqd (Multiple Vitamins-Minerals) .... One Tablespoon Daily 5)  Triamterene-Hctz 37.5-25 Mg  Caps (Triamterene-Hctz) .... Take One and One Half Tablet By Mouth Once Daily 6)  Omeprazole 20 Mg  Tbec (Omeprazole) .... Take 1 Tablet By Mouth Once A Day 7)  Lovastatin 40 Mg Tabs (Lovastatin) .... Take 1 Tab By Mouth At Bedtime 8)  Nitroglycerin 0.4 Mg  Subl (Nitroglycerin) .... One Tablet Under Tounge At Onset of Chest Pains, Repeat Every Five Min As Needed 9)  Alendronate Sodium 70 Mg Tabs (Alendronate Sodium) .... Take One Tab Once Weekly 10)  Exelon 9.5 Mg/24hr Pt24 (Rivastigmine) .... Apply One Patch Every Day  Allergies (verified): No Known Drug Allergies  Past History:  PMH, FH, and Social History reviewed and updated.  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 Osteoporosis ANXIETY DISORDER, GENERALIZED (ICD-300.02) OSTEOARTHRITIS (ICD-715.90)  Review of Systems       See history of present illness  Vital Signs:  Patient profile:   77 year old female Menstrual status:  hysterectomy Weight:      179 pounds BMI:     25.78 Pulse rate:   68 / minute BP sitting:   126 / 69  (right arm)  Vitals Entered By: Candace Saa, CNA (June 11, 2010 1:18 PM)  Physical Exam  General:  Proportionate weight and height; well developed; no acute distress:   Neck-No JVD; no carotid bruits: Lungs-No tachypnea, no rales; no rhonchi; no wheezes: Cardiovascular-normal PMI; normal S1 and S2; minimal systolic murmur: Abdomen-BS normal; soft and non-tender without masses or organomegaly:  Musculoskeletal-No deformities, no cyanosis or clubbing: Neurologic-Normal cranial nerves; symmetric strength and tone:  Skin-Warm, no significant lesions: Extremities-Nl distal pulses; trace edema:     PPM Specifications Following MD:  Candace Bunting, MD     PPM Vendor:  [employer]     PPM Model Number:  [id number]     PPM Serial Number:  [id number] DOI:  06/30/2006     PPM Implanting MD:  Candace Bunting, MD  Lead 1    Location: RA     DOI: 08/30/2001     Model #: 1342T     Serial #:  [id number]     Status: active Lead 2    Location: RV     DOI: 08/30/2001     Model #: 1346T     Serial #: [id number]     Status: active   Indications:  BRADY WITH PAF    PPM Follow Up Pacer Dependent:  No      Episodes Coumadin:  No  Parameters Mode:  DDIR     Lower Rate Limit:  60     Upper Rate Limit:  105 Paced AV Delay:  350     Impression & Recommendations:  Problem # 1:   ATHEROSCLEROTIC CARDIOVASCULAR DISEASE-NONOBSTRUCTIVE (ICD-429.2) No symptoms at present to suggest myocardial ischemia; continued optimal control of cardiovascular risk factors is our goal.  Problem # 2:  SICK SINUS SYNDROME-PERMANENT PACEMAKER (ICD-427.81) Patient is 3 months overdue for pacemaker reassessment.  This service will be performed today.  Problem # 3:  HYPERTENSION (ICD-401.9) Blood pressure control is excellent; current medications will be continued.  KCL will be provided in liquid form at a dose of 20 mEq q.d. with a repeat chemistry profile in one month.  An alternative approach would be to discontinue diuretics and treat hypertension with non-diuretic  agents.  If she remains focused on serum potassium, this might be the way to go.  Problem # 4:  HYPERLIPIDEMIA (ICD-272.4) Lipid profile was slightly suboptimal; however, patient has difficulty affording her medications and would need to move to a much more expensive agent.  Considering her advanced age and the absence of symptomatic cardiovascular disease, I do not think this is warranted.  Other Orders: Future Orders: T-Basic Metabolic Panel [id number]) ... 07/14/2010  Patient Instructions: 1)  Your physician recommends that you schedule a follow-up appointment in: 1 year 2)  Your physician recommends that you return for lab work in: 1 month 3)  Your physician has recommended you make the following change in your medication: your Potassium has been changed to the liquid formand has beenn called in to [facility] [place]. Prescriptions: POTASSIUM CHLORIDE 20 MEQ/15ML (10%) LIQD (POTASSIUM CHLORIDE) Take 20 meq. by mouth once daily  #1 bottle x 11   Entered by:   Candace Cervantes   Authorized by:   Candace Brunswick, MD, [facility]   Signed by:   Candace Cervantes on 52/84/1324   Method used:   Historical   RxID:   [id number] POTASSIUM CHLORIDE 20 MEQ/15ML (10%) LIQD (POTASSIUM CHLORIDE) take 20 meq. by mouth once daily  #1  bottle x 11   Entered by:   Candace Cervantes   Authorized by:   Candace Brunswick, MD, [facility]   Signed by:   Candace Cervantes on 74/25/9563   Method used:   Electronically to        [facility] [address] [phone]* (retail)       [address], [place]  [address]       Ph: [phone]       Fax: [phone]   RxID:   [id number]

## 2010-11-04 NOTE — Letter (Signed)
Summary: Normal Results Letter  [facility]  [address]   [place], [place] [address]   Phone: [phone]  Fax: [phone]    January 08, 2010  Candace Cervantes [address] [place], [place]  [address] 05/17/1925   Dear Ms. Yetta Barre,   Our office has been trying to contact you.  We just wanted to inform you that  your tests were normal and you did not have C-Diff colitis. If you have any questions, please call the office at [phone].   Thank you,    Hendricks Limes, LPN Cloria Spring, LPN  [facility] Ph: [phone]   Fax: [phone]

## 2010-11-04 NOTE — Assessment & Plan Note (Signed)
Summary: OFFICE VISIT   Vital Signs:  Patient profile:   77 year old female Menstrual status:  hysterectomy Height:      70 inches Weight:      183.50 pounds BMI:     26.42 O2 Sat:      98 % Pulse rate:   71 / minute Pulse rhythm:   regular Resp:     16 per minute BP sitting:   120 / 74  (right arm) Cuff size:   regular  Vitals Entered By: Everitt Amber (October 08, 2009 11:03 AM)  Nutrition Counseling: Patient's BMI is greater than 25 and therefore counseled on weight management options. CC: Follow up chronic problems, has found a knot on her left temple and it has grown some. Wants it checked. Has noticed in the past that small knots will come up on different parts of her head but they go away   Primary Care Provider:  Lodema Hong  CC:  Follow up chronic problems and has found a knot on her left temple and it has grown some. Wants it checked. Has noticed in the past that small knots will come up on different parts of her head but they go away.  History of Present Illness: Reports  that she has been  doing fairly  well. Denies recent fever or chills. Denies sinus pressure, nasal congestion , ear pain or sore throat. Denies chest congestion, or cough productive of sputum. Denies chest pain, palpitations, PND, orthopnea or leg swelling. Denies abdominal pain, nausea, vomitting, diarrhea or constipation. Denies change in bowel movements or bloody stool. Denies dysuria , frequency, incontinence or hesitancy.  Denies headaches, vertigo, seizures. Denies depression, anxiety or insomnia. Denies  rash,  or itch.     Current Medications (verified): 1)  Klor-Con M10 10 Meq  Tbcr (Potassium Chloride Crys Cr) .... Take 1 Tablet By Mouth Once A Day 2)  Meclizine Hcl 25 Mg  Tabs (Meclizine Hcl) .... As Needed 3)  Oscal 500/200 D-3 500-200 Mg-Unit  Tabs (Calcium-Vitamin D) .... Take 1 Tablet By Mouth Three Times A Day 4)  Aspirin 81 Mg  Tbec (Aspirin) .... Take 1 Tablet By Mouth Once A  Day 5)  Centrum   Liqd (Multiple Vitamins-Minerals) .... One Tablespoon Daily 6)  Triamterene-Hctz 37.5-25 Mg  Caps (Triamterene-Hctz) .... Take One and One Half Tablet By Mouth Once Daily 7)  Omeprazole 20 Mg  Tbec (Omeprazole) .... Take 1 Tablet By Mouth Once A Day 8)  Lovastatin 40 Mg Tabs (Lovastatin) .... Take 1 Tab By Mouth At Bedtime 9)  Nitroglycerin 0.4 Mg  Subl (Nitroglycerin) .... One Tablet Under Tounge At Onset of Chest Pains, Repeat Every Five Min As Needed 10)  Citalopram Hydrobromide 20 Mg Tabs (Citalopram Hydrobromide) .... Take 1 Tablet By Mouth Once A Day 11)  Alendronate Sodium 70 Mg Tabs (Alendronate Sodium) .... Take One Tab Once Weekly 12)  Exelon 9.5 Mg/24hr Pt24 (Rivastigmine) .... Apply One Patch Every Day  Allergies (verified): No Known Drug Allergies  Review of Systems      See HPI Eyes:  Denies blurring, discharge, and red eye. MS:  Complains of joint pain, low back pain, mid back pain, and stiffness; chronic and unchanged, not debilitating. Derm:  Complains of lesion(s); swelling in front of left ear since November which has increased in size. Neuro:  Complains of memory loss; denies headaches, poor balance, seizures, and sensation of room spinning. Psych:  Complains of anxiety and depression; denies easily angered, easily tearful,  irritability, mental problems, suicidal thoughts/plans, thoughts of violence, and unusual visions or sounds. Endo:  Denies cold intolerance, excessive hunger, excessive thirst, excessive urination, heat intolerance, polyuria, and weight change. Heme:  Denies abnormal bruising and bleeding. Allergy:  Complains of seasonal allergies; mild.  Physical Exam  General:  Well-developed,well-nourished,in no acute distress; alert,appropriate and cooperative throughout examination HEENT: No facial asymmetry,  EOMI, No sinus tenderness, TM's Clear, oropharynx  pink and moist.   Chest: Clear to auscultation bilaterally.  CVS: S1, S2, No  murmurs, No S3.   Abd: Soft, Nontender.  MS: Adequate ROM spine, hips, shoulders and knees.  Ext: No edema.   CNS: CN 2-12 intact, power tone and sensation normal throughout.   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  Memory intact, not anxious or depressed appearing.     Impression & Recommendations:  Problem # 1:  LIPOMA (ICD-214.9) Assessment Comment Only  Orders: Surgical Referral (Surgery)  Problem # 2:  DEMENTIA (ICD-294.8) Assessment: Improved pt to continue exelon patches  Problem # 3:  ANXIETY DISORDER, GENERALIZED (ICD-300.02) Assessment: Improved  Her updated medication list for this problem includes:    Citalopram Hydrobromide 20 Mg Tabs (Citalopram hydrobromide) .Marland Kitchen... Take 1 tablet by mouth once a day  Problem # 4:  HYPERTENSION (ICD-401.9) Assessment: Unchanged  Her updated medication list for this problem includes:    Triamterene-hctz 37.5-25 Mg Caps (Triamterene-hctz) .Marland Kitchen... Take one and one half tablet by mouth once daily  BP today: 120/74 Prior BP: 124/78 (07/05/2009)  Labs Reviewed: K+: 4.4 (05/21/2009) Creat: : 1.02 (05/21/2009)   Chol: 193 (12/06/2008)   HDL: 88 (12/06/2008)   LDL: 88 (12/06/2008)   TG: 83 (12/06/2008)  Problem # 5:  HYPERLIPIDEMIA (ICD-272.4) Assessment: Comment Only  Her updated medication list for this problem includes:    Lovastatin 40 Mg Tabs (Lovastatin) .Marland Kitchen... Take 1 tab by mouth at bedtime  Labs Reviewed: SGOT: 23 (05/21/2009)   SGPT: 16 (05/21/2009)   HDL:88 (12/06/2008), 79 (09/17/2008)  LDL:88 (12/06/2008), 124 (93/23/5573)  Chol:193 (12/06/2008), 218 (09/17/2008)  Trig:83 (12/06/2008), 75 (09/17/2008) needs rept lipid panel  Problem # 6:  GERD (ICD-530.81) Assessment: Improved  Her updated medication list for this problem includes:    Omeprazole 20 Mg Tbec (Omeprazole) .Marland Kitchen... Take 1 tablet by mouth once a day  Complete Medication List: 1)   Klor-con M10 10 Meq Tbcr (Potassium chloride crys cr) .... Take 1 tablet by mouth once a day 2)  Meclizine Hcl 25 Mg Tabs (Meclizine hcl) .... As needed 3)  Oscal 500/200 D-3 500-200 Mg-unit Tabs (Calcium-vitamin d) .... Take 1 tablet by mouth three times a day 4)  Aspirin 81 Mg Tbec (Aspirin) .... Take 1 tablet by mouth once a day 5)  Centrum Liqd (Multiple vitamins-minerals) .... One tablespoon daily 6)  Triamterene-hctz 37.5-25 Mg Caps (Triamterene-hctz) .... Take one and one half tablet by mouth once daily 7)  Omeprazole 20 Mg Tbec (Omeprazole) .... Take 1 tablet by mouth once a day 8)  Lovastatin 40 Mg Tabs (Lovastatin) .... Take 1 tab by mouth at bedtime 9)  Nitroglycerin 0.4 Mg Subl (Nitroglycerin) .... One tablet under tounge at onset of chest pains, repeat every five min as needed 10)  Citalopram Hydrobromide 20 Mg Tabs (Citalopram hydrobromide) .... Take 1 tablet by mouth once a day 11)  Alendronate Sodium 70 Mg Tabs (Alendronate sodium) .... Take one tab once weekly 12)  Exelon 9.5  Mg/24hr Pt24 (Rivastigmine) .... Apply one patch every day  Patient Instructions: 1)  CPe in 4 months. 2)  no med changes at this time. 3)  Continue to keep active and follow a healthy diet. 4)  You will be referrd to Dr Lovell Sheehan about the swelling on ypour left jaw. 5)  All the best for 2011!!

## 2010-11-04 NOTE — Progress Notes (Signed)
Summary: ENT  ENT   Imported By: Lind Guest 12/09/2009 15:06:08  _____________________________________________________________________  External Attachment:    Type:   Image     Comment:   External Document

## 2010-11-04 NOTE — Progress Notes (Signed)
  Phone Note Other Incoming   Caller: dr Lovell Sheehan Summary of Call: let pt know from the scan she had done , the swelling is from fluid around her left jaw, TMJ, she needs to see a dentist or oral surgeon about this, he cannot help her, I recommend in the interim, alleve 1 twice daily for 1 week and cool compresses thismay help Initial call taken by: [name] MD,  October 17, 2009 6:01 PM  Follow-up for Phone Call        patient states she wants to wait and see how it goes before she sees anyone else Follow-up by: Worthy Keeler LPN,  October 18, 2009 10:05 AM  Additional Follow-up for Phone Call Additional follow up Details #1::        noted and agree Additional Follow-up by: [name] MD,  October 18, 2009 1:18 PM

## 2010-11-04 NOTE — Assessment & Plan Note (Signed)
Summary: physical   Vital Signs:  Patient profile:   77 year old female Menstrual status:  hysterectomy Height:      70 inches Weight:      182 pounds BMI:     26.21 O2 Sat:      97 % Pulse rate:   91 / minute Pulse rhythm:   regular Resp:     16 per minute BP sitting:   130 / 78  (left arm) Cuff size:   regular  Vitals Entered By: Everitt Amber LPN (Feb 05, 453 9:50 AM)  Nutrition Counseling: Patient's BMI is greater than 25 and therefore counseled on weight management options. CC: CPE, follow up   Primary Care Provider:  Lodema Hong, M.D.  CC:  CPE and follow up.  History of Present Illness: Reports  that she has gnerally been doing well. Denies recent fever or chills. Denies sinus pressure, nasal congestion , ear pain or sore throat. Denies chest congestion, or cough productive of sputum. Denies chest pain, palpitations, PND, orthopnea or leg swelling. Denies abdominal pain, nausea, vomitting, diarrhea or constipation. Denies change in bowel movements or bloody stool. Denies dysuria , frequency, incontinence or hesitancy. She does have a few wetting incidents if she "waits too long" c/o left shoulder stiffness and reducedmobility , does not want to see ortho now but wantsshots in the office. Denies headaches, vertigo, seizures Denies depression, anxiety or insomnia.Meds have improved these symptoms significantly Denies  rash, lesions, or itch.     Allergies: No Known Drug Allergies  Review of Systems      See HPI General:  Complains of fatigue. ENT:  bilateral TMJ left worse than right, dentures ill fitting needs new ones over 5 yrs old. GI:  Complains of abdominal pain; difficulty swallowing solids iin the past 1 mth seeing Gi. GU:  Complains of incontinence; occasional . MS:  Complains of joint pain and stiffness; left shopulder pain and stiffnes, worse in the past 1 mth, asking for celebrex easier to swallow and effective. Psych:  Denies anxiety and  depression. Endo:  Denies cold intolerance, excessive hunger, excessive thirst, excessive urination, heat intolerance, polyuria, and weight change. Heme:  Denies abnormal bruising and bleeding. Allergy:  Denies hives or rash, itching eyes, and seasonal allergies.  Physical Exam  General:  Well-developed,well-nourished,in no acute distress; alert,appropriate and cooperative throughout examination HEENT: No facial asymmetry,  EOMI, No sinus tenderness, TM's Clear, oropharynx  pink and moist.   Chest: Clear to auscultation bilaterally.  CVS: S1, S2, No murmurs, No S3.   Abd: Soft, Nontender. no organomegaly or masses, rectal exam, no mass and guaic neg stool. MS: Adequate ROM spine, hips, knees. Narkedly reduced ROM left shoulder. Ext: No edema.   CNS: CN 2-12 intact, power tone and sensation normal throughout.   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  Memory intact, not anxious or depressed appearing.     Impression & Recommendations:  Problem # 1:  SHOULDER PAIN, LEFT (ICD-719.41) Assessment Deteriorated  The following medications were removed from the medication list:    Ibuprofen 600 Mg Tabs (Ibuprofen) .Marland Kitchen... 1 by mouth two times a day for 14 days then prn. crush pill and put in apple sauce or ice cream Her updated medication list for this problem includes:    Aspirin 81 Mg Tbec (Aspirin) ..... Once weekly    Celebrex 200 Mg Caps (Celecoxib) .Marland Kitchen... Take 1 capsule by  mouth once a day  Orders: Depo- Medrol 80mg  (J1040) Ketorolac-Toradol 15mg  (Z6109) Admin of Therapeutic Inj  intramuscular or subcutaneous (60454)  Problem # 2:  GERD (ICD-530.81) Assessment: Deteriorated  Her updated medication list for this problem includes:    Omeprazole 20 Mg Tbec (Omeprazole) .Marland Kitchen... Take 1 tablet by mouth once a day  Problem # 3:  ANXIETY DISORDER, GENERALIZED (ICD-300.02) Assessment: Improved  Problem # 4:   HYPERTENSION (ICD-401.9) Assessment: Improved  Her updated medication list for this problem includes:    Triamterene-hctz 37.5-25 Mg Caps (Triamterene-hctz) .Marland Kitchen... Take one and one half tablet by mouth once daily  Orders: T-Basic Metabolic Panel [id number])  BP today: 130/78 Prior BP: 140/62 (01/02/2010)  Labs Reviewed: K+: 4.4 (10/08/2009) Creat: : 0.92 (10/08/2009)   Chol: 202 (10/08/2009)   HDL: 71 (10/08/2009)   LDL: 117 (10/08/2009)   TG: 70 (10/08/2009)  Problem # 5:  HYPERLIPIDEMIA (ICD-272.4) Assessment: Comment Only  Her updated medication list for this problem includes:    Lovastatin 40 Mg Tabs (Lovastatin) .Marland Kitchen... Take 1 tab by mouth at bedtime  Orders: T-Hepatic Function [id number]) T-Lipid Profile [phone])  Labs Reviewed: SGOT: 21 (10/08/2009)   SGPT: 17 (10/08/2009), low fat diet discussed and encouraged   HDL:71 (10/08/2009), 88 (12/06/2008)  LDL:117 (10/08/2009), 88 (28/41/3244)  Chol:202 (10/08/2009), 193 (12/06/2008)  Trig:70 (10/08/2009), 83 (12/06/2008)  Complete Medication List: 1)  Klor-con M10 10 Meq Tbcr (Potassium chloride crys cr) .... Take 1 tablet by mouth once a day 2)  Meclizine Hcl 25 Mg Tabs (Meclizine hcl) .... As needed 3)  Oscal 500/200 D-3 500-200 Mg-unit Tabs (Calcium-vitamin d) .... Take 1 tablet by mouth three times a day 4)  Aspirin 81 Mg Tbec (Aspirin) .... Once weekly 5)  Centrum Liqd (Multiple vitamins-minerals) .... One tablespoon daily 6)  Triamterene-hctz 37.5-25 Mg Caps (Triamterene-hctz) .... Take one and one half tablet by mouth once daily 7)  Omeprazole 20 Mg Tbec (Omeprazole) .... Take 1 tablet by mouth once a day 8)  Lovastatin 40 Mg Tabs (Lovastatin) .... Take 1 tab by mouth at bedtime 9)  Nitroglycerin 0.4 Mg Subl (Nitroglycerin) .... One tablet under tounge at onset of chest pains, repeat every five min as needed 10)  Alendronate Sodium 70 Mg Tabs (Alendronate sodium) .... Take one tab once weekly 11)  Exelon 9.5  Mg/24hr Pt24 (Rivastigmine) .... Apply one patch every day 12)  Celebrex 200 Mg Caps (Celecoxib) .... Take 1 capsule by mouth once a day  Other Orders: T-CBC w/Diff ([id number]) T-Vitamin D (25-Hydroxy) ([id number])  Patient Instructions: 1)  Please schedule a follow-up appointment in 4.5 months. 2)  It is important that you exercise regularly at least 20 minutes 5 times a week. If you develop chest pain, have severe difficulty breathing, or feel very tired , stop exercising immediately and seek medical attention. 3)  BMP prior to visit, ICD-9: 4)  Hepatic Panel prior to visit, ICD-9: 5)  Lipid Panel prior to visit, ICD-9:   fasting  in 2 weeks or after 6)  CBC w/ Diff prior to visit, ICD-9: 7)  Vit D 8)  you will get injections today for your left shoulder. 9)  be careful, no too much strenuous potentially dangerous activity. 10)  New med a s discussed for your arthritis Prescriptions: CELEBREX 200 MG CAPS (CELECOXIB) Take 1 capsule by mouth once a day  #30 x 5   Entered and Authorized by:   [name] MD   Signed by:   [name] MD  on 02/05/2010   Method used:   Electronically to        [facility]. [phone]* (retail)       [address] [place], [place]  [address]       Ph: [phone]       Fax: [phone]   RxID:   [id number]     Medication Administration  Injection # 1:    Medication: Depo- Medrol 80mg     Diagnosis: SHOULDER PAIN, LEFT (ICD-719.41)    Route: IM    Site: RUOQ gluteus    Exp Date: 09/2010    Lot #: [id number]    Mfr: Pharmacia    Comments: 80mg  given     Patient tolerated injection without complications    Given by: Everitt Amber LPN (Feb 05, 4741 10:38 AM)  Injection # 2:    Medication: Ketorolac-Toradol 15mg     Diagnosis: SHOULDER PAIN, LEFT (ICD-719.41)    Route: IM    Site: LUOQ gluteus    Exp Date: 09/2011    Lot #: [id number]     Mfr: [email or web address]    Comments: 60mg  given     Patient tolerated injection without  complications    Given by: Everitt Amber LPN (Feb 06, 5955 10:39 AM)  Orders Added: 1)  Est. Patient Level IV [38756] 2)  T-Basic Metabolic Panel [[id number]] 3)  T-Hepatic Function [80076-22960] 4)  T-Lipid Profile [80061-22930] 5)  T-CBC w/Diff [16606-30160] 6)  T-Vitamin D (25-Hydroxy) [10932-35573] 7)  Depo- Medrol 80mg  [J1040] 8)  Ketorolac-Toradol 15mg  [J1885] 9)  Admin of Therapeutic Inj  intramuscular or subcutaneous [22025]

## 2010-11-04 NOTE — Letter (Signed)
Summary: [facility]   Imported By: Diana Eves 11/08/2009 12:31:14  _____________________________________________________________________  External Attachment:    Type:   Image     Comment:   External Document

## 2010-11-04 NOTE — Assessment & Plan Note (Signed)
Summary: office visit   Vital Signs:  Patient profile:   77 year old female Menstrual status:  hysterectomy Height:      70 inches Weight:      180 pounds Pulse (ortho):   72 / minute Pulse rhythm:   regular Resp:     16 per minute BP sitting:   130 / 80  (right arm)  Vitals Entered By: Everitt Amber LPN (July 09, 2010 11:07 AM)  Primary Care Provider:  [name] MD   History of Present Illness: Reports  that she is doing fairly well. She c/o fatigue, but most of the visit os spent discussing the deterioration in her spouse's health and memory. she is now getting help from [facility] , and is happy about this Denies recent fever or chills. Denies sinus pressure, nasal congestion , ear pain or sore throat. Denies chest congestion, or cough productive of sputum. Denies chest pain, palpitations, PND, orthopnea or leg swelling. Denies abdominal pain, nausea, vomitting, diarrhea or constipation. Denies change in bowel movements or bloody stool. Denies dysuria , frequency, incontinence or hesitancy. Occasional  joint pain, and  reduced mobility.affecting back and shoulders. Denies headaches, vertigo, seizures. Denies depression, anxiety or insomnia. Denies  rash, lesions, or itch.     Allergies: No Known Drug Allergies  Review of Systems      See HPI General:  Complains of fatigue. Eyes:  Complains of vision loss-both eyes; denies blurring and discharge. MS:  Complains of joint pain, low back pain, mid back pain, and stiffness. Neuro:  Complains of memory loss. Psych:  Complains of anxiety; denies depression. Endo:  Denies excessive hunger and excessive thirst. Heme:  Denies abnormal bruising and bleeding. Allergy:  Denies hives or rash and itching eyes.  Physical Exam  General:  Well-developed,well-nourished,in no acute distress; alert,appropriate and cooperative throughout examination HEENT: No facial asymmetry,  EOMI, No sinus tenderness, TM's Clear,  oropharynx  pink and moist.   Chest: Clear to auscultation bilaterally.  CVS: S1, S2, No murmurs, No S3.   Abd: Soft, Nontender. no organomegaly or masses,  MS: Adequate ROM spine, hips, knees and shoulders.  Ext: No edema.   CNS: CN 2-12 intact, power tone and sensation normal throughout.   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  Memory loss, not anxious or depressed appearing.     Impression & Recommendations:  Problem # 1:  SICK SINUS SYNDROME-PERMANENT PACEMAKER (ICD-427.81) Assessment Comment Only followed by cardiology on a regular basis  Problem # 2:  HYPERTENSION (ICD-401.9) Assessment: Unchanged  Her updated medication list for this problem includes:    Triamterene-hctz 37.5-25 Mg Caps (Triamterene-hctz) .Marland Kitchen... Take one and one half tablet by mouth once daily  BP today: 130/80 Prior BP: 126/69 (06/11/2010)  Labs Reviewed: K+: 3.9 (02/19/2010) Creat: : 1.00 (02/19/2010)   Chol: 200 (02/19/2010)   HDL: 99 (02/19/2010)   LDL: 89 (02/19/2010)   TG: 62 (02/19/2010)  Problem # 3:  DEMENTIA (ICD-294.8) Assessment: Unchanged continue exelon patch  Problem # 4:  HYPERLIPIDEMIA (ICD-272.4) Assessment: Comment Only  Her updated medication list for this problem includes:    Lovastatin 40 Mg Tabs (Lovastatin) .Marland Kitchen... Take 1 tab by mouth at bedtime Low fat diet discussed and encouraged, and literature also given  Labs Reviewed: SGOT: 18 (02/19/2010)   SGPT: 14 (02/19/2010)   HDL:99 (02/19/2010), 99 (02/19/2010)  LDL:89 (02/19/2010), 89 (02/19/2010)  Chol:200 (02/19/2010), 200 (  02/19/2010)  Trig:62 (02/19/2010), 62 (02/19/2010)  Complete Medication List: 1)  Potassium Chloride 20 Meq/46ml (10%) Liqd (Potassium chloride) .... Take 20 meq. by mouth once daily 2)  Meclizine Hcl 25 Mg Tabs (Meclizine hcl) .... As needed 3)  Oscal 500/200 D-3 500-200 Mg-unit Tabs (Calcium-vitamin d) .... Take 1 tablet by  mouth three times a day 4)  Centrum Liqd (Multiple vitamins-minerals) .... One tablespoon daily 5)  Triamterene-hctz 37.5-25 Mg Caps (Triamterene-hctz) .... Take one and one half tablet by mouth once daily 6)  Omeprazole 20 Mg Tbec (Omeprazole) .... Take 1 tablet by mouth once a day 7)  Lovastatin 40 Mg Tabs (Lovastatin) .... Take 1 tab by mouth at bedtime 8)  Nitroglycerin 0.4 Mg Subl (Nitroglycerin) .... One tablet under tounge at onset of chest pains, repeat every five min as needed 9)  Exelon 9.5 Mg/24hr Pt24 (Rivastigmine) .... Apply one patch every day  Other Orders: Influenza Vaccine NON MCR ([id number])  Patient Instructions: 1)  Please schedule a follow-up appointment in 4 months. 2)  No med changes at this time.Except you need to stop alendronate. 3)  The medication list was reviewed and reconciled..All changed/newly prescribed medications were explained. A complete medication list was provided to the patient/caregiver.  4)   Pls call for additional help or if you have any problems Prescriptions: LOVASTATIN 40 MG TABS (LOVASTATIN) Take 1 tab by mouth at bedtime  #30.0 Each x 3   Entered by:   Adella Hare LPN   Authorized by:   [name] MD   Signed by:   Adella Hare LPN on 81/19/1478   Method used:   Electronically to        [facility] [address] [phone]* (retail)       [address] [place]  [address]       Ph: [phone]       Fax: [phone]   RxID:   [id number] OMEPRAZOLE 20 MG  TBEC (OMEPRAZOLE) Take 1 tablet by mouth once a day  #30 Each x 3   Entered by:   Adella Hare LPN   Authorized by:   [name] MD   Signed by:   Adella Hare LPN on 59/56/3875   Method used:   Electronically to        [facility] [address] [phone]* (retail)       [address] [place], [place]  [address]       Ph: [phone]       Fax: [phone]   RxID:   [id number]    Influenza Vaccine    Vaccine Type: Fluvax Non-MCR    Site: right  deltoid    Mfr: novartis    Dose: 0.5 ml    Route: IM    Given by: Mauricia Area    Exp. Date: 02/2011    Lot #: [id number]    VIS given: 04/29/10 version given July 09, 2010.

## 2010-11-04 NOTE — Miscellaneous (Signed)
Summary: CBCD,BMP,LIPIDS,LIVER,02/19/2010  Clinical Lists Changes  Observations: Added new observation of CALCIUM: 9.6 mg/dL (60/63/0160 10:93) Added new observation of ALBUMIN: 4.5 g/dL (23/55/7322 02:54) Added new observation of PROTEIN, TOT: 7.2 g/dL (27/03/2375 28:31) Added new observation of SGPT (ALT): 14 units/L (02/19/2010 15:04) Added new observation of SGOT (AST): 18 units/L (02/19/2010 15:04) Added new observation of ALK PHOS: 49 units/L (02/19/2010 15:04) Added new observation of BILI DIRECT: 0.1 mg/dL (51/76/1607 37:10) Added new observation of CREATININE: 1.00 mg/dL (62/69/4854 62:70) Added new observation of BUN: 18 mg/dL (35/00/9381 82:99) Added new observation of BG RANDOM: 93 mg/dL (37/16/9678 93:81) Added new observation of CO2 PLSM/SER: 28 meq/L (02/19/2010 15:04) Added new observation of CL SERUM: 103 meq/L (02/19/2010 15:04) Added new observation of K SERUM: 3.9 meq/L (02/19/2010 15:04) Added new observation of NA: 141 meq/L (02/19/2010 15:04) Added new observation of LDL: 89 mg/dL (01/75/1025 85:27) Added new observation of HDL: 99 mg/dL (78/24/2353 61:44) Added new observation of TRIGLYC TOT: 62 mg/dL (31/54/0086 76:19) Added new observation of CHOLESTEROL: 200 mg/dL (50/93/2671 24:58) Added new observation of ABSOLUTE BAS: 0.0 K/uL (02/19/2010 15:04) Added new observation of BASOPHIL %: 0 % (02/19/2010 15:04) Added new observation of EOS ABSLT: 0.1 K/uL (02/19/2010 15:04) Added new observation of % EOS AUTO: 1 % (02/19/2010 15:04) Added new observation of ABSOLUTE MON: 0.3 K/uL (02/19/2010 15:04) Added new observation of MONOCYTE %: 5 % (02/19/2010 15:04) Added new observation of ABS LYMPHOCY: 1.9 K/uL (02/19/2010 15:04) Added new observation of LYMPHS %: 2.8 % (02/19/2010 15:04) Added new observation of PLATELETK/UL: 216 K/uL (02/19/2010 15:04) Added new observation of RDW: 14.0 % (02/19/2010 15:04) Added new observation of MCHC RBC: 32.0 g/dL (09/98/3382  50:53) Added new observation of MCV: 91.7 fL (02/19/2010 15:04) Added new observation of HCT: 37.5 % (02/19/2010 15:04) Added new observation of HGB: 12.0 g/dL (97/67/3419 37:90) Added new observation of RBC M/UL: 4.09 M/uL (02/19/2010 15:04) Added new observation of WBC COUNT: 5.1 10*3/microliter (02/19/2010 15:04)

## 2010-11-11 ENCOUNTER — Ambulatory Visit: Payer: Self-pay | Admitting: Family Medicine

## 2010-11-26 ENCOUNTER — Encounter: Payer: Self-pay | Admitting: Family Medicine

## 2010-11-26 ENCOUNTER — Ambulatory Visit (INDEPENDENT_AMBULATORY_CARE_PROVIDER_SITE_OTHER): Payer: MEDICARE | Admitting: Family Medicine

## 2010-11-26 DIAGNOSIS — E785 Hyperlipidemia, unspecified: Secondary | ICD-10-CM

## 2010-11-26 DIAGNOSIS — F411 Generalized anxiety disorder: Secondary | ICD-10-CM

## 2010-11-26 DIAGNOSIS — I10 Essential (primary) hypertension: Secondary | ICD-10-CM

## 2010-12-02 NOTE — Assessment & Plan Note (Signed)
Summary: office visit   Vital Signs:  Patient profile:   77 year old female Menstrual status:  hysterectomy Height:      70 inches Weight:      180.75 pounds BMI:     26.03 O2 Sat:      98 % Pulse rate:   71 / minute Pulse rhythm:   regular Resp:     16 per minute BP sitting:   120 / 70  (left arm) Cuff size:   regular  Vitals Entered By: Everitt Amber LPN (November 26, 2010 9:22 AM) CC: Follow up chronic problems Comments didn't bring meds   Primary Care Provider:  [name] MD  CC:  Follow up chronic problems.  History of Present Illness: Reports  that she has been fair. Her spouse was recently dx with malignant melanoma, and she has been busy going back and forth to [facility], so is a bit exhauted. Denies recent fever or chills. Denies sinus pressure, nasal congestion , ear pain or sore throat. Denies chest congestion, or cough productive of sputum. Denies chest pain, palpitations, PND, orthopnea or leg swelling. Denies abdominal pain, nausea, vomitting, diarrhea or constipation. Denies change in bowel movements or bloody stool. Denies dysuria , frequency, incontinence or hesitancy.  Denies headaches, vertigo, seizures. Denies depression, anxiety or insomnia. Denies  rash, lesions, or itch.     Allergies: No Known Drug Allergies  Review of Systems      See HPI General:  Complains of fatigue and weakness. Eyes:  Denies discharge and eye pain. MS:  Complains of joint pain and stiffness; intermittent. Neuro:  Complains of memory loss. Psych:  Complains of mental problems; denies anxiety and depression. Endo:  Denies cold intolerance, excessive hunger, excessive thirst, and excessive urination. Heme:  Denies abnormal bruising and bleeding. Allergy:  Denies hives or rash and itching eyes.  Physical Exam  General:  Well-developed,well-nourished,in no acute distress; alert,appropriate and cooperative throughout examination HEENT: No facial asymmetry,    EOMI, No sinus tenderness, TM's Clear, oropharynx  pink and moist.   Chest: Clear to auscultation bilaterally.  CVS: S1, S2, No murmurs, No S3.   Abd: Soft, Nontender. no organomegaly or masses,  MS: Adequate ROM spine, hips, knees and shoulders.  Ext: No edema.   CNS: CN 2-12 intact, power tone and sensation normal throughout.   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  Memory loss, not anxious or depressed appearing.     Impression & Recommendations:  Problem # 1:  HYPERTENSION (ICD-401.9) Assessment Improved  Her updated medication list for this problem includes:    Triamterene-hctz 37.5-25 Mg Caps (Triamterene-hctz) .Marland Kitchen... Take one and one half tablet by mouth once daily  Orders: T-Basic Metabolic Panel [phone] [id number])  BP today: 120/70 Prior BP: 130/80 (07/09/2010)  Labs Reviewed: K+: 4.1 (09/30/2010) Creat: : 0.96 (09/30/2010)   Chol: 200 (02/19/2010)   HDL: 99 (02/19/2010)   LDL: 89 (02/19/2010)   TG: 62 (02/19/2010)  Problem # 2:  ANXIETY DISORDER, GENERALIZED (ICD-300.02) Assessment: Deteriorated  Her updated medication list for this problem includes:    Alprazolam 0.25 Mg Tabs (Alprazolam) ..... One tablet as needed for anxiety. 10 tablets to last 3 months  Orders: Medicare Electronic Prescription [id number])  Discussed medication use and relaxation techniques.   Problem # 3:  DEMENTIA (ICD-294.8) Assessment: Unchanged  Problem # 4:  HYPERLIPIDEMIA (ICD-272.4) Assessment: Comment Only  Her updated medication list for  this problem includes:    Lovastatin 40 Mg Tabs (Lovastatin) .Marland Kitchen... Take 1 tab by mouth at bedtime .Low fat dietdiscussed and encouraged  Orders: T-Hepatic Function [id number]) T-Lipid Profile [id number])  Labs Reviewed: SGOT: 18 (02/19/2010)   SGPT: 14 (02/19/2010)   HDL:99 (02/19/2010), 99 (02/19/2010)  LDL:89 (02/19/2010), 89 (02/19/2010)  Chol:200 (02/19/2010),  200 (02/19/2010)  Trig:62 (02/19/2010), 62 (02/19/2010)  Complete Medication List: 1)  Potassium Chloride 20 Meq/89ml (10%) Liqd (Potassium chloride) .... Take 20 meq. by mouth once daily 2)  Meclizine Hcl 25 Mg Tabs (Meclizine hcl) .... As needed 3)  Oscal 500/200 D-3 500-200 Mg-unit Tabs (Calcium-vitamin d) .... Take 1 tablet by mouth three times a day 4)  Centrum Liqd (Multiple vitamins-minerals) .... One tablespoon daily 5)  Triamterene-hctz 37.5-25 Mg Caps (Triamterene-hctz) .... Take one and one half tablet by mouth once daily 6)  Omeprazole 20 Mg Tbec (Omeprazole) .... Take 1 tablet by mouth once a day 7)  Lovastatin 40 Mg Tabs (Lovastatin) .... Take 1 tab by mouth at bedtime 8)  Nitroglycerin 0.4 Mg Subl (Nitroglycerin) .... One tablet under tounge at onset of chest pains, repeat every five min as needed 9)  Exelon 9.5 Mg/24hr Pt24 (Rivastigmine) .... Apply one patch every day 10)  Alprazolam 0.25 Mg Tabs (Alprazolam) .... One tablet as needed for anxiety. 10 tablets to last 3 months  Other Orders: T-CBC w/Diff ([id number]) T-TSH [id number])  Patient Instructions: 1)  CPE in may. 2)  BMP prior to visit, ICD-9: 3)  Hepatic Panel prior to visit, ICD-9: 4)  Lipid Panel prior to visit, ICD-9:  fasting in May 5)  TSH prior to visit, ICD-9: 6)  CBC w/ Diff prior to visit, ICD-9: 7)  your mamo is due may 2 or after, pls schedule in April. 8)  new med for anxiety. Prescriptions: ALPRAZOLAM 0.25 MG TABS (ALPRAZOLAM) one tablet as needed for anxiety. 10 tablets to last 3 months  #10 x 0   Entered and Authorized by:   [name] MD   Signed by:   [name] MD on 11/26/2010   Method used:   Printed then faxed to ...       [facility] [address] [phone]* (retail)       [address] [place], [place]  [address]       Ph: [phone]       Fax: [phone]   RxID:   [id number]    Orders Added: 1)  Est. Patient Level IV [66063] 2)  Medicare Electronic  Prescription [G8553] 3)  T-Basic Metabolic Panel [phone] 4)  T-Hepatic Function [[id number]] 5)  T-Lipid Profile [[id number]] 6)  T-CBC w/Diff [[id number]] 7)  T-TSH [[id number]]

## 2010-12-11 ENCOUNTER — Encounter (INDEPENDENT_AMBULATORY_CARE_PROVIDER_SITE_OTHER): Payer: Self-pay | Admitting: *Deleted

## 2010-12-16 NOTE — Letter (Signed)
Summary: Device-Delinquent Check  [facility], Main Office  [address]   [place], [place] [address]   Phone: [phone]  Fax: [phone]     December 11, 2010 MRN: [id number]   Candace Cervantes [address] [place], [place]  [address]   Dear Ms. KUCHERA,  According to our records, you have not had your implanted device checked in the recommended period of time.  We are unable to determine appropriate device function without checking your device on a regular basis.  Please call our office to schedule an appointment as soon as possible.  If you are having your device checked by another physician, please call us so that we may update our records.  Thank you,  Letta Moynahan, EMT  December 11, 2010 2:56 PM  [facility]

## 2010-12-17 LAB — TSH: TSH: 1.925 u[IU]/mL (ref 0.350–4.500)

## 2010-12-17 LAB — CBC
HCT: 33.7 % — ABNORMAL LOW (ref 36.0–46.0)
MCH: 31.8 pg (ref 26.0–34.0)
MCHC: 33.8 g/dL (ref 30.0–36.0)
MCHC: 34.3 g/dL (ref 30.0–36.0)
MCV: 92.6 fL (ref 78.0–100.0)
Platelets: 181 10*3/uL (ref 150–400)
RDW: 13.6 % (ref 11.5–15.5)
RDW: 13.7 % (ref 11.5–15.5)
WBC: 4.2 10*3/uL (ref 4.0–10.5)

## 2010-12-17 LAB — DIFFERENTIAL
Basophils Absolute: 0 10*3/uL (ref 0.0–0.1)
Basophils Absolute: 0 10*3/uL (ref 0.0–0.1)
Basophils Relative: 0 % (ref 0–1)
Eosinophils Absolute: 0.1 10*3/uL (ref 0.0–0.7)
Eosinophils Absolute: 0.1 10*3/uL (ref 0.0–0.7)
Eosinophils Relative: 1 % (ref 0–5)
Eosinophils Relative: 2 % (ref 0–5)
Lymphocytes Relative: 51 % — ABNORMAL HIGH (ref 12–46)
Monocytes Absolute: 0.3 10*3/uL (ref 0.1–1.0)
Monocytes Absolute: 0.4 10*3/uL (ref 0.1–1.0)

## 2010-12-17 LAB — TROPONIN I: Troponin I: 0.01 ng/mL (ref 0.00–0.06)

## 2010-12-17 LAB — CARDIAC PANEL(CRET KIN+CKTOT+MB+TROPI)
CK, MB: 3.6 ng/mL (ref 0.3–4.0)
Relative Index: 1.9 (ref 0.0–2.5)
Relative Index: 1.9 (ref 0.0–2.5)
Relative Index: 2 (ref 0.0–2.5)
Total CK: 183 U/L — ABNORMAL HIGH (ref 7–177)
Troponin I: 0.01 ng/mL (ref 0.00–0.06)
Troponin I: 0.02 ng/mL (ref 0.00–0.06)
Troponin I: <0.01 ng/mL (ref 0.00–0.06)

## 2010-12-17 LAB — BASIC METABOLIC PANEL
BUN: 14 mg/dL (ref 6–23)
BUN: 16 mg/dL (ref 6–23)
CO2: 30 mEq/L (ref 19–32)
Calcium: 9.8 mg/dL (ref 8.4–10.5)
Chloride: 104 mEq/L (ref 96–112)
Creatinine, Ser: 1.14 mg/dL (ref 0.4–1.2)
GFR calc non Af Amer: 50 mL/min — ABNORMAL LOW (ref >60)
Glucose, Bld: 102 mg/dL — ABNORMAL HIGH (ref 70–99)
Glucose, Bld: 104 mg/dL — ABNORMAL HIGH (ref 70–99)
Potassium: 3.4 mEq/L — ABNORMAL LOW (ref 3.5–5.1)
Potassium: 3.8 mEq/L (ref 3.5–5.1)
Sodium: 141 mEq/L (ref 135–145)

## 2010-12-17 LAB — PROTIME-INR: Prothrombin Time: 13.5 seconds (ref 11.6–15.2)

## 2010-12-17 LAB — POCT CARDIAC MARKERS
CKMB, poc: 3.2 ng/mL (ref 1.0–8.0)
Troponin i, poc: <0.05 ng/mL (ref 0.00–0.09)

## 2010-12-17 LAB — CK TOTAL AND CKMB (NOT AT ARMC)
CK, MB: 4.2 ng/mL — ABNORMAL HIGH (ref 0.3–4.0)
Relative Index: 2 (ref 0.0–2.5)
Total CK: 208 U/L — ABNORMAL HIGH (ref 7–177)

## 2011-01-11 LAB — CARDIAC PANEL(CRET KIN+CKTOT+MB+TROPI)
CK, MB: 4 ng/mL (ref 0.3–4.0)
CK, MB: 5.6 ng/mL — ABNORMAL HIGH (ref 0.3–4.0)
Relative Index: 2.1 (ref 0.0–2.5)
Relative Index: 2.3 (ref 0.0–2.5)
Total CK: 190 U/L — ABNORMAL HIGH (ref 7–177)
Total CK: 243 U/L — ABNORMAL HIGH (ref 7–177)
Troponin I: 0.02 ng/mL (ref 0.00–0.06)
Troponin I: 0.03 ng/mL (ref 0.00–0.06)

## 2011-01-11 LAB — LIPID PANEL
Cholesterol: 184 mg/dL (ref 0–200)
LDL Cholesterol: 92 mg/dL (ref 0–99)

## 2011-01-11 LAB — CBC
MCHC: 34.4 g/dL (ref 30.0–36.0)
MCV: 93.7 fL (ref 78.0–100.0)
Platelets: 182 10*3/uL (ref 150–400)
RBC: 3.8 MIL/uL — ABNORMAL LOW (ref 3.87–5.11)
WBC: 4.9 10*3/uL (ref 4.0–10.5)

## 2011-01-11 LAB — BASIC METABOLIC PANEL
BUN: 18 mg/dL (ref 6–23)
CO2: 30 mEq/L (ref 19–32)
CO2: 31 mEq/L (ref 19–32)
Calcium: 9.3 mg/dL (ref 8.4–10.5)
Calcium: 9.8 mg/dL (ref 8.4–10.5)
Chloride: 103 mEq/L (ref 96–112)
Creatinine, Ser: 0.98 mg/dL (ref 0.4–1.2)
Creatinine, Ser: 1.07 mg/dL (ref 0.4–1.2)
GFR calc Af Amer: 59 mL/min — ABNORMAL LOW (ref >60)
GFR calc Af Amer: >60 mL/min (ref >60)
GFR calc non Af Amer: 54 mL/min — ABNORMAL LOW (ref >60)

## 2011-01-11 LAB — POCT CARDIAC MARKERS: Myoglobin, poc: 156 ng/mL (ref 12–200)

## 2011-01-11 LAB — DIFFERENTIAL
Basophils Relative: 0 % (ref 0–1)
Eosinophils Absolute: 0 10*3/uL (ref 0.0–0.7)
Neutro Abs: 2.6 10*3/uL (ref 1.7–7.7)
Neutrophils Relative %: 53 % (ref 43–77)

## 2011-01-11 LAB — MAGNESIUM: Magnesium: 2 mg/dL (ref 1.5–2.5)

## 2011-01-16 ENCOUNTER — Other Ambulatory Visit: Payer: Self-pay | Admitting: Family Medicine

## 2011-01-16 DIAGNOSIS — F0390 Unspecified dementia without behavioral disturbance: Secondary | ICD-10-CM

## 2011-02-03 ENCOUNTER — Other Ambulatory Visit: Payer: Self-pay | Admitting: Family Medicine

## 2011-02-03 DIAGNOSIS — Z139 Encounter for screening, unspecified: Secondary | ICD-10-CM

## 2011-02-11 ENCOUNTER — Other Ambulatory Visit: Payer: Self-pay | Admitting: Family Medicine

## 2011-02-17 NOTE — Assessment & Plan Note (Signed)
[facility]                       [place] [facility] OFFICE NOTE   NAME:JONESAngeletta, Cervantes                       MRN:          [id number]  DATE:08/31/2008                            DOB:          1924-12-21    CARDIOLOGIST:  Candace Friends. Dietrich Pates, MD, [facility]   PRIMARY CARE PHYSICIAN:  Candace Cervantes. Candace Cervantes, M.D.   REASON FOR VISIT:  Routine followup.   HISTORY OF PRESENT ILLNESS:  Candace Cervantes is an 77 year old female patient  with a history of minimal nonobstructive coronary artery disease by  catheterization November 2005 and a recent nonischemic nuclear study in  April 2009.  She is status post pacemaker implantation secondary to  symptomatic bradycardia.  In the office today, she notes that she is  overall doing well without chest pain.  She does note some episodes of  lightheadedness from time to time.  She almost describes near syncopal  episodes.  She denies any frank syncope.  She also notes problems with  balance.  She denies any significant changes in shortness of breath with  exertion.  She describes NYHA class IIB symptoms.  She denies orthopnea  or PND.  She denies pedal edema.  She did see one of the EP nurses about  a week ago and was told that there was some type of abnormal rhythm  noted on interrogation of her device.   CURRENT MEDICATIONS:  Aspirin 81 mg daily, Vitamin daily, Os-Cal plus  vitamin D, Potassium 10 mEq daily, Lovastatin 20 mg daily,  Maxzide 37.5/25 mg one and half tablet daily, Meclizine p.r.n.,  Omeprazole 20 mg daily p.r.n., Nitroglycerin p.r.n.   PHYSICAL EXAMINATION:  GENERAL:  She is a well-nourished, well-developed  female, in no distress.  VITAL SIGNS:  Blood pressure is 149/76, pulse 71, and weight 183 pounds.  Orthostatic vital signs, blood pressure lying 150/80 with pulse of 68,  sitting 148/82 with pulse of 68, standing 142/80 with a pulse of 68  after 10 minutes 150/80 with pulse of 72, after 5 minutes 148/82  with  pulse of 64.  HEENT:  Normal.  NECK:  Without JVD.  CARDIAC:  Normal S1 and S2.  Regular rate and rhythm.  LUNGS:  Clear to auscultation bilaterally.  ABDOMEN:  Soft and nontender.  EXTREMITIES:  No edema.  NEUROLOGIC:  She is alert and oriented x3.  Cranial II through XII are  grossly intact.   ASSESSMENT AND PLAN:  1. Dizziness.  The patient is status post permanent pacemaker      implantation secondary to symptomatic bradycardia.  I did speak to      one of the Electrophysiology nurses from our [facility] and      there were high atrial rates noted on her device.  These were less      than 1% of the time.  It is uncertain whether or not this was      atrial fibrillation or just premature atrial contractions.  The      patient is certainly at risk for atrial fibrillation with a long      history of hypertension.  She  also has a CHADS2 score of 2 and      would need Coumadin therapy, if she did have atrial fibrillation.      Therefore, I have recommended proceeding with a CardioNet event      monitor.  I have also recommended proceeding with an      echocardiogram.  This has not be done since 2005.  She will also      have blood work to include a CBC, BMET, and a TSH.  She will      continue on aspirin for now and I will see her back in several      weeks to follow up on the above testing.  2. Difficulty with balance.  I have asked her to follow up Dr. Lodema Cervantes      to discuss this further.  She may need referral to Physical Therapy      to help with improving her balance.  3. Minimal nonobstructive coronary artery disease by catheterization      in the past and recent nonischemic nuclear study.  She has      preserved left ventricular function by her recent nuclear study.      She will continue on aspirin and no further workup is indicated at      this time.  4. Hypertension.  The patient would benefit from improved blood      pressure control.  I think a  beta-blocker would be a good choice as      she does have a pacemaker now.  However, I will wait until her      monitor is complete before we start her on any new medications  5. Dyslipidemia.  She will continue on lovastatin.   DISPOSITION:  The patient will follow up with Candace Cervantes or myself in  the next 4-6 weeks or sooner p.r.n.      Candace Newcomer, PA-C  Electronically Signed      Candace Friends. Dietrich Pates, MD, [facility]  Electronically Signed   SW/MedQ  DD: 08/31/2008  DT: 08/31/2008  Job #: [id number]   cc:   Candace Cervantes. Candace Cervantes, M.D.

## 2011-02-17 NOTE — H&P (Signed)
Candace Cervantes, Candace Cervantes                ACCOUNT NO.:  [id number]   MEDICAL RECORD NO.:  [id number]          PATIENT TYPE:  OBV   LOCATION:  A217                          FACILITY:  [facility]   PHYSICIAN:  Thomasenia Bottoms, MDDATE OF BIRTH:  [birth year]   DATE OF ADMISSION:  01/31/2008  DATE OF DISCHARGE:  LH                              HISTORY & PHYSICAL   CHIEF COMPLAINT:  Chest pain.   HISTORY OF PRESENT ILLNESS:  Candace Cervantes is a very independent functional  77 year old woman who says that she developed severe midsternal chest  pain when she was sitting out in her carport.  The patient had just  walked out to get the mail.  She was relaxing under the carport when  this severe pain hit her.  The pain was so severe, she said she felt  very weak.  Her legs felt weak and it really took something out of her.  She definitely was short of breath.  There was no nausea or radiation of  the pain.  She says the pain resolved in about 20 minutes or so, and she  went into house.  She also states that her husband had to help her in  the house but she felt fine and went about her business but later she  developed soreness that came into her chest and that worried her.  She  thought maybe the severe pain was coming back, so she had her husband  bring her into the hospital.  The severe pain has not come back, but she  did have still have the soreness when she presented into the emergency  department.  The patient says that over the last several weeks, she has  been having what she describes as weights  in the middle of her chest  but these pains go away with Rolaids.  She has had to take Rolaids  several times a week, sometimes several times a day, over the last  several weeks, and she describes the weights as indigestion but says  this is different from the heartburn that she had last year.   PAST MEDICAL HISTORY:  Significant for hypertension.  She had a  pacemaker placed in 2003.  She has  hyperlipidemia, history of Zenker  diverticulum, gastric polyps, and hiatal hernia.  The patient's last  cardiac catheterization that I see on the computer was November 2005,  and at that time, she had normal coronary artery.   SOCIAL HISTORY:  The patient does not smoke cigarettes or drink alcohol  nor does she use any illicit drugs.  She lives with her husband.   FAMILY HISTORY:  Significant for colon cancer and coronary artery  disease.   REVIEW OF SYSTEMS:  CONSTITUTIONAL:  No weight loss.  No night sweats.  No fever.  HEENT:  No significant headaches.  She has occasional mild headaches.  She says her blood pressure is up but that is not a chronic problem.  No  sore throat.  CARDIOVASCULAR:  Chest pain as mentioned above.  She occasionally will  have some lower extremity edema but she does  not have any at this time.  No orthopnea.  She has been sleeping on 2 pillows just because someone  told her she is supposed to and she says it does make her indigestion  feel better.  So, she will continue it.  RESPIRATORY:  No wheezing.  No hemoptysis.  She had a little bit of  shortness of breath with the chest pain earlier.  GASTROINTESTINAL:  Tells that she is having indigestion though she  denies that it is heartburn.  She does not have chronic constipation  though she was constipated today.  MUSCULOSKELETAL:  She has some occasional mild knee pain.  All other systems reviewed and are negative.   PHYSICAL EXAMINATION:  VITAL SIGNS:  In the emergency department on  arrival, the patient's blood pressure was 145/67, pulse 68, respiratory  rate 16, O2 sat 98% on room air, and temperature 98.9.  GENERAL:  The patient is no acute distress.  She is well nourished and  well developed.  HEENT:  Normocephalic and atraumatic.  Her pupils are equal and round.  Sclerae nonicteric.  Oral mucosa moist.  NECK:  Supple.  She has some fullness in the base of her left neck.  The  patient says she had a  fatty tumor removed from the back of her neck,  and she says this feels just exactly like the fatty tumor from before  though it is in a slightly different location.  No discrete lymph nodes  are palpable, yet the area is nontender.  CARDIOVASCULAR:  She is slightly bradycardic.  No murmurs are  appreciated.  LUNGS:  Clear to auscultation bilaterally.  No wheezes, rhonchi, or  rales.  EXTREMITY:  She has no lower extremity edema.  She has excellent DP  pulses.  SKIN:  Warm and dry with no open lesions or rashes.  Abdominal: nontender.  non distended.  normoactive bowel sounds.  no  hepatosplenomegaly  NEUROLOGIC:  She is alert and cooperative.  She is appropriate.  Her  cranial nerves II through XII are intact grossly.  She has 5/5 strength  in her upper and lower extremities.  Sensory exam is intact grossly in  her upper and lower extremity.  MUSCULOSKELETAL:  Reveals no evidence of effusion of her joints.  No  synovitis.   MEDICATIONS ON ARRIVAL:  1. Maxzide 37.5/25 one and a half tablets daily.  2. Potassium 10 mEq daily.  3. Lovastatin 20  mg at bedtime.  4. Omeprazole 20 mg daily, which she reports she only takes it as      needed and has not taken it recently.  5. Os-Cal with vitamin D 3 times a day.  6. Aspirin 81 mg daily.  7. Multivitamin 1 tablet daily.   ASSESSMENT AND PLAN:  1. Chest pain.  This patient did have a cardiac catheterization which      was normal just 4 years ago.  However, this pain was exertional,      and she has some other chest pains which are very atypical.  So, we      certainly will admit her to the hospital, and we will rule her out      for myocardial infarction.  The patient says she had a chemical      stress test in the past, and she absolutely refuses to have one      again, and also says, she does not want to go to [place] to have      any test,  which means that cardiac catheterization may be out of      the question.  So, we will just  rule her out for now, and we will      see if there are any other options for stress testing here if that      is the case or have her follow up with cardiology as an outpatient.      She does follow with [facility].  2. Gastroesophageal reflux disease.  It was recommended that the      patient take omeprazole twice a day by gastroenterology during her      last visit in November 2008, and she has not been taking it at all,      but is taking Rolaids.  Therefore, we will encourage her to take      either b.i.d. omeprazole or bump her up to a proton pump      inhibitor like Protonix.  3. Hypertension.  We will continue her Maxzide.  4. Hyperlipidemia.  We will continue her Lipitor.      Thomasenia Bottoms, MD  Electronically Signed     CVC/MEDQ  D:  01/31/2008  T:  02/01/2008  Job:  [id number]   cc:   Milus Mallick. Lodema Hong, M.D.  Fax: [phone]

## 2011-02-17 NOTE — Letter (Signed)
March 14, 2008    Milus Mallick. Lodema Hong, M.D.  [address]  [place], [place] [address]   RE:  Candace, Cervantes  MRN:  [id number]  /  DOB:  1925/08/29   Dear Claris Che,   Candace Cervantes returns to the office for continued assessment and treatment  of sick sinus syndrome, hypertension, and hyperlipidemia.  Since her  last visit, she has done generally well.  She reports some decline in  her exercise tolerance, but she attributes this to age.  She has had no  dyspnea.  She has had no recurrent chest discomfort.  Her pacemaker has  not been assessed by [name] for more than year by our records.   CURRENT MEDICATIONS:  1. Aspirin 81 mg daily.  2. Os-Cal with vitamin D.  3. Potassium chloride 10 mEq daily.  4. Lovastatin 40 mg daily.  5. Prilosec 20 mg b.i.d.  6. Maxzide one and one-half daily.   On exam, very pleasant woman in no acute distress.  The weight is 187,  unchanged.  Blood pressure 120/70, heart rate 65 and regular, and  respirations 16.  NECK:  No jugular venous distention.  Normal carotid upstrokes without  bruits.  LUNGS:  Clear.  CARDIAC:  Normal first heart sounds.  Increased intensity of second  heart sounds; fourth heart sound versus split first heart sound.  ABDOMEN:  Soft and nontender; no masses nor organomegaly.  EXTREMITIES:  Trace edema; distal pulses intact.   Recent laboratory indicates a normal CBC, a normal TSH, and a potassium  of 3.5 with normal renal function.   IMPRESSION:  Candace Cervantes is doing quite well overall.  We will have her  pacemaker evaluated by [name].  Her dose of potassium will be  increased to 20 mEq daily with a repeat chemistry profile and a lipid  profile in 1 month.   I will plan to see this nice woman again in 9 months.    Sincerely,      Gerrit Friends. Dietrich Pates, MD, [facility]  Electronically Signed    RMR/MedQ  DD: 03/14/2008  DT: 03/15/2008  Job #: [id number]

## 2011-02-17 NOTE — Consult Note (Signed)
Candace Cervantes, Candace Cervantes                ACCOUNT NO.:  [id number]   MEDICAL RECORD NO.:  [id number]          PATIENT TYPE:  AMB   LOCATION:  DAY                           FACILITY:  [facility]   PHYSICIAN:  Kassie Mends, M.D.      DATE OF BIRTH:  10-28-1924   DATE OF CONSULTATION:  DATE OF DISCHARGE:                                 CONSULTATION   REQUESTING PHYSICIAN:  Milus Mallick. Lodema Hong, M.D.   REASON FOR CONSULTATION:  Dysphagia. She tells me sometimes she has to  think hard, in order to initiate a swallow.  She denies any nausea,  vomiting, anorexia   HISTORY OF PRESENT ILLNESS:  Candace Cervantes is a 77 year old African-American  female with a 41-month history of dysphagia.  She says that this began  mostly with pills.  She has been dilated at least twice before by Dr.  Katrinka Blazing.  She believes her last dilatation was about 7 years ago.  She  feels as though solid food gets stuck in her upper to mid-esophagus.  She denies any odynophagia, denies any problems with liquids.  She does  have occasional indigestion about three times a week, for which she  takes Rolaids.  She has also been on Reglan 5 mg a.c. and h.s., which  seems to help with her acid reflux as well.  Her symptoms are worse  nocturnally, but since she has been on the Reglan,  they have improved.  She tells me sometimes she has to think hard in order to initiate a  swallow.  She denies any nausea, vomiting, anorexia or early satiety.  Her weight has remained stable.  She denies any abdominal pain, and her  bowel movements are normal, soft and brown, without rectal bleeding or  melena.   PAST MEDICAL AND SURGICAL HISTORY:  History of esophageal dilatations by  Dr. Katrinka Blazing.  Last one was about 7 years ago.  She tells me she had a  normal colonoscopy about 4 to 5 years and status post hysterectomy.  She  has a pacemaker.  She has history of hypertension, hypercholesterolemia,  GERD, status post cholecystectomy   CURRENT MEDICATIONS:  1.  Reglan 5 mg a.c. and h.s.  2. Lovastatin 10 mg q.h.s.  3. Triamterene.  4. HCTZ 37/25 mg, 1-1/2 half daily.  5. Aspirin 81 mg daily.  6. Multivitamin daily.  7. Os-Cal plus vitamin D t.i.d.   ALLERGIES:  NO KNOWN DRUG ALLERGIES.   FAMILY HISTORY:  There is a family history of a brother with colon  cancer in his 9s.   SOCIAL HISTORY:  Candace Cervantes is married.  She is retired from [employer].  She denies any tobacco, alcohol or drug use.   REVIEW OF SYSTEMS:  See HPI, otherwise negative.   PHYSICAL EXAM:  VITAL SIGNS:  Weight 108 pounds, height 70 inches, temp  97.6, blood pressure 130/78, pulse 60.  GENERAL:  Candace Cervantes is a well-developed, well-nourished elderly African-  American female in no acute distress.  HEENT:  Sclerae non-icteric, conjunctivae pink.  Oropharynx pink and  moist without lesions.  NECK:  Supple without mass or thyromegaly.  CHEST:  Heart regular rhythm, normal S1, S2.  No murmur, clicks, rubs or  gallops.  LUNGS:  Clear to auscultation bilaterally.  ABDOMEN:  Positive bowel sounds x4.  No bruits auscultated.  Soft,  nontender, nondistended without palpable mass or hepatosplenomegaly.  No  rebound tenderness or guarding.  EXTREMITIES:  Without clubbing or edema bilaterally.  SKIN:  Warm, dry without rash or jaundice.   IMPRESSION:  Candace Cervantes is an 77 year old female with recurrent  dysphagia, mostly with pills and occasionally with solid foods.  She  also has a history of chronic third a couple of times a week has just  been taking p.r.n. Rolaids and Reglan p.r.n.  Her symptoms are worse  nocturnally.  She is going to need further evaluation via EGD with  possible esophageal dilatation, if she has developed web ring or  stricture.   There is a family history of colon cancer.  She believes the last colon  endoscopy was approximately 4 to 5 years ago and will request records to  be sure she is not due for colonoscopy at this time.   PLAN:  1.  Begin omeprazole 20 mg daily.  I have given her a prescription for      31 with two refills as well as 10 days worth of Prilosec, 20 mg be      taken in the morning.  2. EGD with Dr. Cira Servant the near future.  I have discussed the      procedure, risks, benefits included but not limited to bleeding,      perforation.  She expressed understanding.  Consent will be      obtained.  Will request colonoscopy report to determine if Ms.      Cervantes is due for colonoscopy.  She does have a family history of      colon cancer.   We would like to thank Dr. Lodema Hong for allowing [place] to participate in the  care of Candace Cervantes.      Lorenza Burton, N.P.      Kassie Mends, M.D.  Electronically Signed    KJ/MEDQ  D:  07/20/2007  T:  07/21/2007  Job:  [id number]   cc:   Milus Mallick. Lodema Hong, M.D.  Fax: [phone]

## 2011-02-17 NOTE — Group Therapy Note (Signed)
NAMEMARKETA, Candace Cervantes                ACCOUNT NO.:  [id number]   MEDICAL RECORD NO.:  [id number]          PATIENT TYPE:  OBV   LOCATION:  A217                          FACILITY:  [facility]   PHYSICIAN:  Dorris Singh, DO    DATE OF BIRTH:  1924-12-12   DATE OF PROCEDURE:  02/02/2008  DATE OF DISCHARGE:                                 PROGRESS NOTE   The patient was seen today after stress test, await results from  cardiology.  She states this morning she had had an episode of chest  pain of which I was notified.  She had her stress test done this  afternoon. We will see what they recommended and we will possibly  discharge her tonight.   VITAL SIGNS:  Temperature 98.3, pulse 66, respirations 18, blood  pressure 121/68.  GENERAL:  The patient is a well-developed, well-nourished woman who is  in no acute distress.  HEART:  Heart is regular rate and rhythm.  LUNGS:  Clear to auscultation bilaterally.  ABDOMEN:  Soft, nontender, nondistended.  EXTREMITIES:  Positive pulses.  No edema, ecchymosis or cyanosis.   LABS:  Today her troponin was 0.4.  Her CK-MB was 4.1 and her CK was  196.   ASSESSMENT AND PLAN:  1. Chest pain.  Await further recommendations from stress test.  2. Hypertension.  Will continue to monitor which is controlled.  3. Increased cholesterol.  4. GERD (gastroesophageal reflux disease).   PLAN:  Will be as soon as cardiology lets [place] know if she can go home we  will go ahead and institute discharge planning.      Dorris Singh, DO  Electronically Signed     CB/MEDQ  D:  02/02/2008  T:  02/02/2008  Job:  [id number]

## 2011-02-17 NOTE — Letter (Signed)
April 14, 2007    Milus Mallick. Lodema Hong, M.D.  [address]  [place], [place] [address]   RE:  JISSELL, TRAFTON  MRN:  [id number]  /  DOB:  May 16, 1925   Dear Claris Che:   Ms. Lesage returns to the office as scheduled for continued assessment  and treatment of sinus node disease, hypertension, and hyperlipidemia.  Since her last visit nearly 1 year ago she has done quite well.  She was  seen in device clinic in February 2008 with normal parameters on her new  generator.  She reports increased energy in recent months with no  significant medical issues.  Blood pressure control has been good.  Laboratory studies done in June were excellent.   CURRENT MEDICATIONS:  1. Atorvastatin 10 mg daily.  2. Maxzide 37.5/25 mg daily.  3. Lisinopril 25 mg daily.  4. Aspirin 81 mg daily.  5. Metoclopramide 5 mg q.i.d.  6. KCl 10 mEq daily.   PHYSICAL EXAMINATION:  A pleasant woman in no acute distress.  The weight is 187, 1 pound less than in February.  Heart rate is 65 and  regular, blood pressure 120/70, respirations 18.  NECK:  No jugular venous distension, normal carotid upstrokes without  bruits.  LUNGS:  Clear.  CARDIAC:  Normal 1st and 2nd heart sounds, 4th heart sound present.  ABDOMEN:  Soft and nontender, no bruits, no organomegaly.  EXTREMITIES:  1/2+ ankle edema, normal distal pulses.   IMPRESSION:  Ms. Hentges is doing quite well.  Current medications are  appropriate and effective and will be continued.  I will reassess this  nice woman again in 1 year.  Vaccinations are up to date.    Sincerely,      Gerrit Friends. Dietrich Pates, MD, [facility]  Electronically Signed    RMR/MedQ  DD: 04/14/2007  DT: 04/15/2007  Job #: [id number]

## 2011-02-17 NOTE — Consult Note (Signed)
Candace Cervantes, Candace Cervantes                ACCOUNT NO.:  [id number]   MEDICAL RECORD NO.:  [id number]          PATIENT TYPE:  OBV   LOCATION:  A217                          FACILITY:  [facility]   PHYSICIAN:  Gerrit Friends. Dietrich Pates, MD, FACCDATE OF BIRTH:   DATE OF CONSULTATION:  02/01/2008  DATE OF DISCHARGE:  02/02/2008                                 CONSULTATION   REFERRING PHYSICIAN:  Dr. Rito Ehrlich.   PRIMARY CARE PHYSICIAN:  Dr. Lodema Hong.   PRIMARY CARDIOLOGIST:  Gerrit Friends. Dietrich Pates, MD, [facility].   HISTORY OF PRESENT ILLNESS:  An 77 year old woman without known  significant coronary disease referred for an evaluation of chest  discomfort.  Candace Cervantes' cardiac history dates to approximately 8 years  ago when she underwent a pacemaker placement for sick sinus syndrome  performed elsewhere.  She was subsequently followed by Dr. Dorethea Clan for  management of her conduction system disease as well as hypertension and  hyperlipidemia.  She experienced some chest discomfort leading to an  adenosine nuclear study in 2005 that was nonspecifically abnormal with  some decreased apical perfusion.  She subsequently underwent a stress  echocardiogram that suggested ischemia in the distribution of the right  coronary artery.  A cardiac catheterization was then performed, which  revealed no significant coronary disease.  She subsequently has done  well and has been seen in our office only for reevaluation of her  pacemaker.  On the day of admission, she developed moderate to severe  mid substernal chest pressure while seated at home.  This immediately  followed a walk to her mailbox that itself did not bother her.  She  experienced associated weakness without palpitations.  She noted  dyspnea, but no nausea nor lightheadedness.  There was no radiation of  the discomfort.  It persisted for a number of minutes and then resolved  spontaneously.  She was left with some residual soreness that worried  her enough that she  decided to come to the emergency department from  whence she was admitted.   She does have a history of GERD, but has not had pain of this type in  the past.  Those symptoms are typically relieved with antacids.  Hypertension and hyperlipidemia have been under good control.  Pacemaker  function was last assessed in 2008 and was normal.  Prior surgeries  include cholecystectomy, hysterectomy, a cosmetic procedure to tighten  the skin around her neck.   SOCIAL HISTORY:  Lives locally with her husband; no use of tobacco  products or alcohol.   FAMILY HISTORY:  Positive for neoplastic disease and coronary artery  disease.   REVIEW OF SYSTEMS:  Notable for intermittent headaches, no dietary  restrictions, use of corrective lenses for near and far vision, upper  and lower dentures.  All other systems reviewed and are negative.   PHYSICAL EXAMINATION:  GENERAL:  A pleasant and well-appearing woman in  no acute distress.  VITAL SIGNS:  The temperature is 98.2, heart rate 70 and regular, blood  pressure 125/85, respirations 16, and O2 saturation 95% on room air.  HEENT:  Anicteric sclerae; normal  lids and conjunctivae.  Normal oral  mucosa.  NECK:  No jugular venous distention; no carotid bruits.  LUNGS:  Minimal bibasilar rales.  CARDIAC:  Normal first and second heart sounds.  ABDOMEN:  Soft and nontender; no organomegaly.  EXTREMITIES:  Trace edema.  NEUROLOGIC:  Symmetric strength and tone; normal cranial nerves.  PSYCHIATRIC:  Alert and oriented; normal affect.  SKIN:  No significant lesions.   DIAGNOSTIC IMAGING:  EKG:  Left anterior fascicular block; delayed R-  wave progression; nonspecific ST-T wave abnormalities.   LABORATORY DATA:  Otherwise notable for hemoglobin 11.2 with a normal  MCV, a normal BMET except for mild hypokalemia.  Cardiac markers show a  slight elevation of total CK with a negative MB and negative troponin.   IMPRESSION:  Candace Cervantes presents with symptoms  that are quite consistent  with myocardial ischemia; however, she is unlikely to have developed  significant coronary disease between the ages of 17 and 77 when none was  present in 2005.  A stress test should be adequate to exclude high-risk  coronary disease.  Candace Cervantes has had an adverse experienced in the past  with adenosine.  As long as she does not require activation of her  pacemaker, an exercise stress test or dobutamine stress test should be  feasible.  We will attempt to proceed with one of those in the morning  after myocardial infarction is ruled out.      Gerrit Friends. Dietrich Pates, MD, [facility]  Electronically Signed     RMR/MEDQ  D:  02/02/2008  T:  02/03/2008  Job:  [id number]

## 2011-02-17 NOTE — Letter (Signed)
October 04, 2008    Milus Mallick. Lodema Hong, MD  [address]  [place], [place] [address]   RE:  COZY, VEALE  MRN:  [id number]  /  DOB:  1924-12-19   Dear Claris Che,   Ms. Babson returns to the office for continued assessment and treatment  of dizziness.  She was evaluated by Mr. Alben Spittle 6 weeks ago and was  complaining of lightheadedness.  Orthostatic vital signs were  unremarkable.  We were concerned about a possible arrhythmia.  She has  carried an event recorder for 3 weeks without any notable abnormalities.  Most of the record shows atrial pacing.  There is occasional ventricular  pacing and sinus rhythm.  One 6-beat run of a ventricular tachycardia  was detected.  Ms. Back reported no symptoms throughout that interval.  She indicates that she is taking meclizine with good resolution of her  problem.   MEDICATIONS:  Unchanged from her last visit.   PHYSICAL EXAMINATION:  GENERAL:  Very pleasant woman, in no acute  distress.  VITAL SIGNS:  The weight is 184, unchanged.  Blood pressure 160/90,  heart rate 70 and regular, and respirations 14.  NECK:  No jugular venous distention; no carotid bruits.  CARDIAC:  Normal first and second heart sounds; fourth heart sound  present.  LUNGS:  Clear.  ABDOMEN:  Soft and nontender.  EXTREMITIES:  No edema.   LABORATORY DATA:  Laboratory obtained 2 weeks ago includes a normal  chemistry profile, normal hepatic profile, and somewhat suboptimal lipid  profile with total cholesterol of 218, triglycerides of 75, HDL of 79,  and LDL of 124.   IMPRESSION:  Ms. Bowe appears to be stable from a cardiovascular  standpoint except for suboptimal control of hypertension.  She reports  that she did not take her blood pressure medicine this morning and has  been somewhat anxious related to doing errands, the holiday season and  her event recorder.  We have given her a card to record blood pressures  at home.  She will return in 1 month for a blood  pressure check by the  cardiology nurses.  I will plan to see her again in 1 year.  Her  pacemaker was assessed by the Electrophysiology Service in November and  was functioning normally.    Sincerely,      Gerrit Friends. Dietrich Pates, MD, [facility]  Electronically Signed    RMR/MedQ  DD: 10/04/2008  DT: 10/05/2008  Job #: [id number]

## 2011-02-17 NOTE — Assessment & Plan Note (Signed)
Candace Cervantes, Candace Cervantes                 CHART#:  [id number]   DATE:  08/23/2007                       DOB:  1925-02-05   Follow-up of procedure.   SUBJECTIVE:  Ms. Howald is here for a follow-up visit.  She is an 77-year-  old [place] American female with 5 month history of dysphagia.  She has  required diltation in the past.  She recently had an EGD by Dr. Kassie Mends which revealed difficulty passing the gastroscope through the  upper esophageal sphincter due to suspected Zenker's  diverticulum. This  was confirmed on a barium swallow.  She had a small Zenker's  diverticulum posteriorly at the cervical esophagus above a hypertrophied  cricopharyngeus muscle.  She was sent to see Dr.  Lazarus Salines.  She states  that there is no intervention being done at this time.  However, her  omeprazole was increased to b.i.d. and she is to follow-up with him next  month.  She was not clear on her prescription and is still taking  omeprazole once daily. She also had on EGD an incomplete fibrous ring at  the GE junction. A 2 cm hiatal hernia and multiple gastric polyps which  were negative and were benign.  She also had a negative stain for h.  pylori.  The patient states that she continues to have problems  swallowing mostly pills, although is not really that bothersome at this  time.  She does have some breakthrough heartburn especially in the  mornings before she takes her medications.  Denies any nausea, vomiting,  abdominal pain, constipation, diarrhea.  She has a family history of  colorectal cancer in a brother in his 78s.  We received records from the  hospital where she had a prior colonoscopy with Dr. Katrinka Blazing and that was  in 07/2002.  Therefore, she is due for her 5 year follow-up at this  time.   CURRENT MEDICATIONS:  See updated list.   ALLERGIES:  No known drug allergies.   PHYSICAL EXAMINATION:  VITALS:  Weight 185, temperature 98.2, blood  pressure 126/78, pulse 76  GENERAL:  Pleasant,  well-nourished, well-developed female in no acute  distress.  SKIN:  Warm and dry, no jaundice.  EXAM:  Limited.   IMPRESSION:  Dysphagia with small Zenker's diverticulum being followed  by Dr. Lazarus Salines .  She did have an incomplete Schatzki ring on EGD but  this was not seen on recent esophagram.  Dysphagia is stable.  She has  some breakthrough GERD symptoms and is going to increase her omeprazole  to 20 mg b.i.d. which should help.  She is due for colonoscopy given  family history of colorectal cancer but she would like to postpone this  to after the first of the year.   PLAN:  1. Omeprazole 20 mg b.i.d.  2. Follow-up with Dr. Lazarus Salines as planned.  3. Will send her a reminder letter in February to schedule      colonoscopy.  4. Office visit on a p.r.n. basis.   ADDENDUM:  Unless Ms. Rouillard has a personal history of polyps. she does  not needs a colonoscopy until 2013. Her first degree relative was  diagnosed with colon cancer after the age of 36.       Tana Coast, P.A.  Electronically Signed     Duncan Dull  Cira Servant, M.D.  Electronically Signed    LL/MEDQ  D:  08/23/2007  T:  08/24/2007  Job:  [id number]   cc:   Milus Mallick. Lodema Hong, M.D.

## 2011-02-17 NOTE — Op Note (Signed)
Candace Cervantes, Candace Cervantes                ACCOUNT NO.:  [id number]   MEDICAL RECORD NO.:  [id number]          PATIENT TYPE:  AMB   LOCATION:  DAY                           FACILITY:  [facility]   PHYSICIAN:  Kassie Mends, M.D.      DATE OF BIRTH:  01-31-1925   DATE OF PROCEDURE:  08/04/2007  DATE OF DISCHARGE:                               OPERATIVE REPORT   PROCEDURE:  Esophagogastroduodenoscopy with cold forceps biopsy.   INDICATION FOR EXAM:  Candace Cervantes is an 77 year old female who has had  difficulty swallowing her pills.   FINDINGS:  1. Difficulty passing the diagnostic gastroscope through the upper      esophageal sphincter due to probable Zenker's diverticulum.  2. The esophagus was successively intubated and was normal in      appearance except an incomplete fibrous ring at the GE junction.      No evidence of Barrett's, mass or stricture.  3. 2 cm hiatal hernia.  Multiple gastric polyps seen in the body of      the stomach.  Biopsies obtained via cold forceps to evaluate for      fundal polyps.  The biopsies will also evaluate other etiologies      for the gastric polyps.  4. Normal duodenal bulb and second portion of the duodenum.   DIAGNOSIS:  1. Candace Cervantes' difficulty swallowing is likely secondary to the      Zenker's diverticulum.  2. Hiatal hernia, small.  3. Incomplete Schatzki's ring.  4. Gastric polyps, biopsies pending.   RECOMMENDATIONS:  1. Schedule barium swallow as soon as possible to confirm the presence      of a Zenker's diverticulum.  2. No aspirin, NSAIDs or anticoagulation for five days.  3. The patient should crush all pills.  4. Follow up appointment in two weeks with Lorenza Burton, N.P., and if      the presence of a Zenker's diverticulum is confirmed and the      patient wishes to pursue further options to definitively address      it, then she needs a referral to ENT.   MEDICATIONS:  1. Demerol 50 mg IV.  2. Versed 2 mg IV.   PROCEDURE TECHNIQUE:   Physical exam was performed.  Informed consent was  obtained from the patient after explaining the benefits, risks and  alternatives to the procedure.  The patient was connected to the monitor  and placed in the left lateral position.  Continuous oxygen was provided  by nasal cannula and IV medicines administered through an indwelling  cannula.  After administration of sedation, the patient's esophagus was  intubated.  The scope  was advanced under direct visualization to the second portion of the  duodenum.  The scope was removed slowly by carefully examining the  color, texture, anatomy and integrity of the mucosa on the way out.  The  patient was recovered in endoscopy and discharged home in satisfactory  condition.      Kassie Mends, M.D.  Electronically Signed     SM/MEDQ  D:  08/04/2007  T:  08/04/2007  Job:  [id number]   cc:   Milus Mallick. Lodema Hong, M.D.  Fax: [phone]

## 2011-02-17 NOTE — Assessment & Plan Note (Signed)
[facility]  HEALTHCARE                       [place] CARDIOLOGY OFFICE NOTE   NAME:[name], Candace                       MRN:          [id number]  DATE:02/16/2008                            DOB:          June 24, 1925    CARDIOLOGIST:  Dr. Lewayne Bunting.   PRIMARY CARE PHYSICIAN:  Dr. [name].   REASON FOR VISIT:  Post hospitalization followup.   HISTORY OF PRESENT ILLNESS:  [name] is an 77 year old female patient  with a history of minimal nonobstructive coronary disease by cardiac  catheterization, November 2005, who recently was evaluated at [facility] for chest pain.  She is also status post permanent pacemaker  implantation secondary to sick sinus syndrome.  She ruled out for  myocardial infarction by enzymes.  She underwent a stress Myoview study  that revealed no significant stress-induced EKG abnormalities.  Her EF  was normal.  She had no evidence of myocardial ischemia.  There was a  question of breast attenuation versus mild inferolateral scarring.  It  was recommended that the patient be discharged to home on a proton pump  inhibitor and follow up in the office today.   The patient notes continued chest discomfort since discharge from the  hospital.  However, she is not taking her proton pump inhibitor as  scheduled.  She was taking it on a regular basis initially and she  noticed her symptoms were resolving.  She notes chest pressure with  eating at times.  This is relieved by antacids p.r.n.  She also notes  discomfort with emotional stress.  She also sometimes notes discomfort  with exertion.  She notes associated shortness of breath.  She also  notes some discomfort in her neck.  She denies syncope, near-syncope,  orthopnea, PND or pedal edema.   CURRENT MEDICATIONS:  1. Maxzide 37.5/25 mg daily.  2. Aspirin mg 81 mg daily.  3. Vitamins daily.  4. Os-Cal plus vitamin D.  5. Klor-Con 10 mEq daily.  6. Lovastatin 20 mg  daily.  7. Prilosec 20 mg daily.  8. Nitroglycerin p.r.n. chest pain.   PHYSICAL EXAM:  She is a well-nourished well-developed female.  Blood pressure is 117/70, pulse 67, weight 187 pounds.  HEENT:  Normal.  NECK: Without JVD.  CARDIAC:  S1 and S2.  Regular rate and rhythm without murmur.  LUNGS:  Clear to auscultation bilaterally.  ABDOMEN:  Soft and nontender.  EXTREMITIES:  Without edema.  Calves soft, nontender.  SKIN: Warm and dry.  NEUROLOGIC:  She is alert and oriented x3.  Cranial II through XII  grossly intact.  VASCULAR:  No carotid bruits noted bilaterally.   IMPRESSION:  1. Chest pain.  2. Minimal nonobstructive coronary disease by cardiac catheterization,      November 2005 (obtuse marginal #1 proximal 30% to 40%).      a.     Stress Myoview study without ischemia, February 02, 2008.  3. Good left ventricular function.  4. Sick sinus syndrome, status post permanent pacemaker implantation.  5. Hypertension.  6. Hyperlipidemia.  7. Gastroesophageal reflux disease.   PLAN:  1. The  patient presents for followup post hospitalization.  She      continues to have symptoms of chest discomfort.  However, these      seem to be more consistent with gastroesophageal reflux disease      than coronary ischemia.  She does have some exertional chest pain,      but most of her chest discomfort comes with meals as well as      emotional stress.  Her symptoms seem to be improving while on      proton pump inhibitor therapy on a regular basis.  She has now      gotten away from taking this on a regular basis.  2. I have recommended she get back on Prilosec 20 mg b.i.d.  3. We will check a CBC.  She complains of some fatigue.  However, this      seems to be mild and chronic without much change.  As she is having      symptoms of gastroesophageal reflux esophagus, I think it is      important to rule out significant anemia.  4. I do not think that she requires cardiac catheterization  at this      point in time.  She had fairly normal looking coronaries at age 62.      It has only been 4 years since that time and she had a negative      Myoview recently.  I discussed her case further with Dr. Eden Emms      today, who agreed.  5. We will see her back in 4 weeks in followup.  I have explained to      her to take her Prilosec on      a daily basis, even if she has no symptoms.  We will reassess her      symptoms when she returns in followup with Dr. Dietrich Pates.     Candace Newcomer, PA-C  Electronically Signed      Candace Cervantes. Eden Emms, MD, [facility]  Electronically Signed   SW/MedQ  DD: 02/16/2008  DT: 02/16/2008  Job #: [id number]   cc:   Milus Mallick. Lodema Hong, M.D.

## 2011-02-17 NOTE — H&P (Signed)
NAMEMILITZA, DEVERY NO.:  [id number]   MEDICAL RECORD NO.:  [id number]          PATIENT TYPE:  EMS   LOCATION:  ED                            FACILITY:  [facility]   PHYSICIAN:  Osvaldo Shipper, MD     DATE OF BIRTH:  [birth year]   DATE OF ADMISSION:  04/24/2009  DATE OF DISCHARGE:  LH                              HISTORY & PHYSICAL   PRIMARY MEDICAL DOCTOR:  Dr. [name].   CARDIOLOGIST:  Gerrit Friends. Dietrich Pates, MD, [facility].   ADMITTING DIAGNOSES:  1. Chest pain, likely angina.  2. History of pacemaker placement for tachybrady syndrome.  3. Hypertension.  4. Dementia.  5. Dyslipidemia.   CHIEF COMPLAINT:  Chest pain since yesterday.   HISTORY OF PRESENT ILLNESS:  The patient is an 77 year old African  American female who was in her usual state of health until yesterday  morning when she woke up and experienced heaviness in the center of her  chest.  She took 1 nitroglycerin.  The pain improved and then after a  couple of hours she took another nitroglycerin and the pain kind of  resolved.  Then this morning, again, she woke up and experienced  heaviness.  She took nitroglycerin, felt better, however, decided to  come back into the hospital for further evaluation.  She also felt weak  and tired.  She denies any leg swelling.  The heaviness in the chest was  8/10 in intensity, improving to 2/10 after the nitroglycerin.  Experienced shortness of breath, dizziness, nausea, but no vomiting.  Denies any cough or fever or chills.  She does say that lifting heavy  weights causes chest pain, though it was not the case today.  She had  some dysentery yesterday that she describes but has not had any such  symptoms today.   She admits to having had stress testing recently and cardiac  catheterization within the last 5 years.   MEDICATIONS AT HOME:  Include  Klor-Con 10 mEq daily,  meclizine 25 mg daily,  aspirin 81 mg daily,  triamterene HCTZ 37.5/25 one and a half  tablets daily,  omeprazole 20 mg daily,  lovastatin 40 mg at bedtime,  citalopram 20 mg daily but she has not been taking this for 6 months,  Reglan 5 mg q.a.c. and h.s.  She is not sure if she has taken this  medication.  Exelon patch 9.5 mg every day,  Os-Cal plus D t.i.d.,  Centrum Silver 1 tablet daily.   ALLERGIES:  NO KNOWN DRUG ALLERGIES.   PAST MEDICAL HISTORY:  Positive for hypertension, dyslipidemia,  pacemaker placement 8 years ago in [place], [place] for tachybrady  syndrome.  She has had cholecystectomy and a hysterectomy.  She has  never had coronary artery disease that required intervention.   She actually had a cardiac cath in it looks like in November of 2005, 4-  1/2 years ago, which did not show any significant coronary artery  disease.  She did have 30-40% proximal branch of the first marginal.  The rest of the arteries are pretty unremarkable.  Then  she had a  nuclear stress test in April of 2009 which was read as a low-risk test,  although the possibility of mild inferolateral scarring could not be  unequivocally excluded.  EF was 65%.   SOCIAL HISTORY:  Lives in [place] with her husband.  No smoking  alcohol or illicit drug use.  Is independent with daily activities,  however, does sometimes need to use a cane.   FAMILY HISTORY:  Positive for colon cancer and diabetes.   REVIEW OF SYSTEMS:  GENERAL SYSTEM:  Positive for weakness, malaise.  HEENT:  Unremarkable.  CARDIOVASCULAR:  Unremarkable.  RESPIRATORY:  Unremarkable except as in HPI.  GI: Unremarkable.  GI:  Positive for  occasional difficulty swallowing.  She tells that she has had stretching  of her esophagus in the past.  GU: Unremarkable.  NEUROLOGIC:  Unremarkable.  PSYCHIATRIC:  Unremarkable.   PHYSICAL EXAMINATION:  VITAL SIGNS:  Temperature 97.8, blood pressure  128/69, heart rate 65, respiratory rate 16, saturating 100% on room air.  GENERAL EXAM:  This is an elderly African American female  in no  distress.  Occasionally appears to be slightly confused.  HEENT:  There is no pallor, no icterus.  Head is normocephalic,  atraumatic.  Oral mucous membranes are moist.  No oral lesions are  noted.  NECK:  Soft and supple.  No thyromegaly is appreciated.  No  lymphadenopathy is present.  LUNGS:  Reveal a few crackles at the bases which clear with deep  inspiration.  Otherwise no wheezing or crackles are present.  No  dullness to percussion.  CARDIOVASCULAR:  S1, S2 normal, regular.  No S3, S4.  No rubs.  No  bruits.  No pedal edema.  Peripheral pulses are palpable.  No JVD.  ABDOMEN:  Soft, nontender, nondistended.  Bowel sounds are present.  No  masses or organomegaly are present.  GU:  Examination deferred.  MUSCULOSKELETAL:  Exam unremarkable.  NEUROLOGICALLY:  She is alert and oriented x3.  No focal neurological  deficits are present.   LABS:  CBC is unremarkable.  Potassium is 3.4, glucose 126.  Renal  function normal, GFR is 59.  Cardiac enzymes negative x1.  Chest x-ray  shows no definite cardiopulmonary process.   EKG was done, which shows a sinus rhythm with a rate of 65.  These are  paced, so further interpretation is limited.   ASSESSMENT:  This is an 77 year old African American female who presents  with heaviness across her chest which actually radiated down both her  arms and she had a repeat episode today and she got concerned and came  into the hospital.  She does have risk factors for coronary artery  disease which include hypertension and dyslipidemia, however, she had a  negative catheterization 4-1/2 years ago and what appears to be a low-  risk stress test in April of 2009.  It is quite unlikely that she has  developed significant enough coronary artery disease in this duration.  However, she does merit observation in the hospital to rule out for  acute coronary syndrome.   PLAN:  1. Chest pain, possible angina.  We will admit her to telemetry,       actually observe her, and rule her out for acute coronary syndrome      by serial cardiac enzymes.  Electrocardiogram will be repeated.      Aspirin will be continued.  Unless she her troponins become      positive, I will withhold beta-blockers.  Her heart rate is already      in the 60s and could further drop.  Only deep vein thrombosis      prophylactic doses of Lovenox will be given at this time.  Lipid      panel will be rechecked.  Continue with lovastatin.  2. Hypertension.  Continue with triamterene HCTZ.  3. Occasional dysphagia.  I have encouraged the patient to discuss      this with her primary medical doctor and maybe she needs to go back      to see Dr. Cira Servant.  Reviewing previous esophagogastroduodenoscopies,      it appears that the patient has a Zenker's diverticulum which could      be causing dysphagia and she was actually referred to an ear, nose      and throat specialist.  4. Dementia.  Continue with Exelon patch.  5. Deep vein thrombosis prophylaxis.  6. She is a full code.   The patient reports feeling unsteady at times and so we will obtain a  physical therapy evaluation.  She does not have any focal neurological  deficits.   Further management decisions will depend on the results of further tests  and the patient's response to treatment.      Osvaldo Shipper, MD  Electronically Signed     GK/MEDQ  D:  04/24/2009  T:  04/24/2009  Job:  [id number]   cc:   Milus Mallick. Lodema Hong, M.D.  Fax: [phone]   Gerrit Friends. Dietrich Pates, MD, [facility]  [address]  [place], [place] [address]

## 2011-02-17 NOTE — Discharge Summary (Signed)
Candace Cervantes, Candace Cervantes                ACCOUNT NO.:  [id number]   MEDICAL RECORD NO.:  [id number]          PATIENT TYPE:  OBV   LOCATION:  A334                          FACILITY:  [facility]   PHYSICIAN:  Osvaldo Shipper, MD     DATE OF BIRTH:  1924/12/11   DATE OF ADMISSION:  04/24/2009  DATE OF DISCHARGE:  07/22/2010LH                               DISCHARGE SUMMARY   The patient's PMD is Dr. [name].   CARDIOLOGIST:  Gerrit Friends. Dietrich Pates, MD, [facility].  She has been seen by Dr.  Kassie Mends as an outpatient.   Please review H and P dictated at the time of admission for details  regarding the patient's presenting illness.   DISCHARGE DIAGNOSES:  1. Chest pain resolved, etiology unclear.  2. History of hypertension.  3. History of dyslipidemia.  4. Early dementia.  5. Pacemaker placement in the past for tachybrady syndrome.  6. Dysphagia.   BRIEF HOSPITAL COURSE:  Briefly, this is an 77 year old, African  American female, who presented to the hospital with a 2-day history of  chest pressure and heaviness.  This heaviness was relieved with  nitroglycerin.  The patient was admitted to the hospital.  EKG showed  paced rhythm.  She has ruled out for acute coronary syndrome by serial  cardiac enzymes.  She has remained chest pain-free overnight and this  morning is feeling quite well.  She denies any shortness of breath,  dizziness, nausea, vomiting.  Since, she has ruled out and since she has  had a negative stress test within the last year and half, I feel  comfortable in sending her home with close followup with Cardiology.  Of  note, she also had an unremarkable cardiac catheterization 4-1/2 years  ago.  The etiology for her pain is not very clear.  Obviously angina is  on the top of the list.  However, I also feel that this could have been  esophageal spasms.   The patient is also complaining some dysphagia on and off for the past  many months.  She has been having these symptoms  chronically and has  seen GI in the past.  She was found to have a Zenker's diverticulum and  actually was referred to ENT, but it is unclear what transpired.  I have  requested the PA with GI, Mr. Melvyn Neth to schedule an appointment for this  patient with Dr. Cira Servant.   Rest of her medical issues remained stable.  She was asked to continue  with all of her home medications as will be described below.   On the day of discharge, otherwise, she feels well.  No other complaints  offered.   PHYSICAL EXAMINATION:  VITAL SIGNS:  Show temperature is 98.3, heart  rate is 66, respiratory rate 20, blood pressure 122/66, saturation 97%  on room air.  LUNGS:  Clear to auscultation.  CARDIOVASCULAR:  S1 and S2 is normal regular.  No S3, S4, no rubs, no  bruits.  ABDOMEN:  Soft, nontender, nondistended.  LOWER EXTREMITIES:  Edema is present.   Overall, she is stable for discharge.  DISCHARGE MEDICATIONS:  1. Klor-Con 10 mEq once daily.  2. Meclizine 25 mg once daily.  3. Aspirin 81 mg daily.  4. Triamterene HCTZ 37.5 x 25 one and a half tablets daily.  5. Omeprazole 20 mg daily.  6. Lovastatin 40 mg at bedtime.  7. Exelon patch 9.5 mg once daily.  8. Os-Cal Plus D 3 times a day.  9. Centrum Silver once daily.  10.Citalopram, she has not taken this in 6 months, so she needs to      talk to her doctor about this.  11.Reglan 5 mg before meals and at bedtime.  She is also not sure if      she is taking this as well and again she needs to talk to her      doctor about this.   1. Follow up with Dr. Kassie Mends appointment made for May 17, 2009      at 11:15.  2. With Dr. Dietrich Pates in 1 week.  3. PMD as needed.   DIET:  Heart-healthy.   PHYSICAL ACTIVITY:  As before.   No consultations during this admission.  Imaging studies includes chest  x-ray which did not show any active disease.   This morning her potassium is 3.2 which will be repleted.  Her BNP was  less than 30.   Total time on  this encounter 35 minutes.      Osvaldo Shipper, MD  Electronically Signed     GK/MEDQ  D:  04/25/2009  T:  04/25/2009  Job:  [id number]   cc:   Gerrit Friends. Dietrich Pates, MD, [facility]  [address]  [place], [place] [address]   Milus Mallick. Lodema Hong, M.D.  Fax: [phone]   Kassie Mends, M.D.  [address]  [place] , [place] [address]

## 2011-02-17 NOTE — Discharge Summary (Signed)
NAMEBRENLYN, Candace Cervantes                ACCOUNT NO.:  [id number]   MEDICAL RECORD NO.:  [id number]          PATIENT TYPE:  OBV   LOCATION:  A217                          FACILITY:  [facility]   PHYSICIAN:  Dorris Singh, DO    DATE OF BIRTH:  1925/02/13   DATE OF ADMISSION:  01/31/2008  DATE OF DISCHARGE:  04/30/2009LH                               DISCHARGE SUMMARY   ADMISSION DIAGNOSES:  1. Chest pain.  2. Gastroesophageal reflux.   DISCHARGE DIAGNOSES:  1. Atypical chest pain.  2. Gastroesophageal reflux.   Her H&P was done by Dr. Gasper Sells, please refer to that.  Her primary  care physician is Dr. [name].   HOSPITAL COURSE:  Patient is an 77 year old woman who presented to the  emergency room with mid substernal chest pain.  While she was here her  enzymes were examined, they were negative, but due to her increased risk  factors she was admitted to the hospital.  She also admits to having  cardiac catheterization about 4 years ago which was normal.  She was  seen by Dr. Dietrich Pates, of [facility], who scheduled her for a  stress test on April 30th.  She had a stress test today which was  concluded to be normal.  Her follow up instructions with him are to  follow up with nitroglycerin sublingual to take as needed and PPI and  she is requested to follow up in his office in 2 weeks.  Also requested  patient follows up with Dr. Lodema Hong in 1 week and there are changes to  her medication.  She should take her PPI twice a day, her omeprazole  should be b.i.d. and her potassium will be increased to 2 tablets daily  and she will get nitroglycerin sublingual 0.4 mg, take one at the onset  of chest pain, repeat every 5 minutes with a max of 3.   CONDITION:  Stable.   DISPOSITION:  To home with the above recommendations.      Dorris Singh, DO  Electronically Signed     CB/MEDQ  D:  02/02/2008  T:  02/02/2008  Job:  [id number]   cc:   Milus Mallick. Lodema Hong, M.D.  Fax:  [phone]

## 2011-02-19 ENCOUNTER — Encounter: Payer: Self-pay | Admitting: Family Medicine
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
## 2011-02-20 NOTE — Procedures (Signed)
Candace Cervantes, Candace Cervantes                ACCOUNT NO.:  [id number]   MEDICAL RECORD NO.:  [id number]          PATIENT TYPE:  INP   LOCATION:  A204                          FACILITY:  [facility]   PHYSICIAN:  [place] Bing, M.D.  DATE OF BIRTH:  1925/09/19   DATE OF PROCEDURE:  08/14/2004  DATE OF DISCHARGE:                                  ECHOCARDIOGRAM   REFERRING PHYSICIAN:  1.  Dr. Orvan Falconer.  2.  Dr. Dietrich Pates.   CLINICAL DATA:  A 77 year old woman with acute coronary syndrome and prior  pacemaker insertion.   M-MODE:  Aorta 2.5, left atrium 3.6, septum 1.3, posterior wall 1.2, LV  diastole 3.5, LV systole 2.1.   RESULTS:  1.  Technically adequate echocardiographic study.  2.  Normal left atrium, right atrium, and right ventricle.  Pacing wire in      the right heart.  3.  Mild mitral valve thickening;  mild to moderate annular calcification.  4.  Normal tricuspid valve;  mild regurgitation;  normal RV pressure.  5.  Pulmonic valve not well seen, probably normal.  6.  Minimal AV sclerosis;  mild calcification of the ascending aorta;      trivial AI.  7.  Normal left ventricular size;  mild left ventricular hypertrophy.      Normal left ventricular systolic function.     Robe   RR/MEDQ  D:  08/14/2004  T:  08/14/2004  Job:  [id number]
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
## 2011-02-20 NOTE — Op Note (Signed)
Candace Cervantes, Candace Cervantes NO.:  [id number]   MEDICAL RECORD NO.:  [id number]          PATIENT TYPE:  OIB   LOCATION:  2899                         FACILITY:  [facility]   PHYSICIAN:  Doylene Canning. Ladona Ridgel, MD    DATE OF BIRTH:  [birth year]   DATE OF PROCEDURE:  06/30/2006  DATE OF DISCHARGE:                                 OPERATIVE REPORT   PROCEDURE PERFORMED:  Removal of a previously implanted dual-chamber  pacemaker and insertion of a new dual-chamber pacemaker.   SURGEON:  Doylene Canning. Ladona Ridgel, MD   INDICATIONS:  Symptomatic bradycardia and paroxysmal atrial fibrillation.   I. INTRODUCTION:  The patient is very pleasant elderly woman with a history  of symptomatic bradycardia and she was subsequently found to be at pacemaker  generator ERI.   II. PROCEDURE:  After informed consent was obtained, the patient was taken  to the diagnostic EP lab in the fasting state.  After the usual preparation  and draping, intravenous fentanyl and midazolam were given for sedation.  Thirty milliliters of lidocaine were infiltrated over the old  infraclavicular pacemaker insertion site and a 5-cm incision was carried out  over this region and electrocautery utilized to dissect down to the fascial  plane.  The generator was removed with gentle traction.  The pocket was  inspected and irrigated with kanamycin.  Electrocautery was utilized to  assure hemostasis.  The pocket was expanded to accommodate the new larger  pacemaker.  The old St. Jude dual-chamber pacemaker was removed and a new  St. Jude Victory XL DR model 5816 dual-chamber pacemaker, serial number  [id number], was connected to the atrial (model 1342, serial number [id number])  pacing lead and the old ventricular pacing lead (St. Jude model 1346, serial  number [id number]).  Device was placed back in the subcutaneous pocket.  Additional kanamycin was utilized to irrigate the pocket and the incision  was then closed with a layer of 2-0 Vicryl  followed by a layer of 3-0  Vicryl, followed by a layer of 4-0 Vicryl.  Of note, the patient, at the  time of her interrogation, was in atrial fibrillation.  This was of unclear  clinical significance.   III. COMPLICATIONS:  There were no immediate procedure complications.   IV. RESULTS:  This demonstrated successful removal of a previous implanted  dual-chamber pacemaker at [facility] and insertion of a new dual-chamber pacemaker  (St. Jude Victory XL DR) without immediate procedure complications.           ______________________________  Doylene Canning. Ladona Ridgel, MD     GWT/MEDQ  D:  06/30/2006  T:  07/02/2006  Job:  [id number]   cc:   Gerrit Friends. Dietrich Pates, MD, [facility]  Milus Mallick. Lodema Hong, M.D.

## 2011-02-20 NOTE — Procedures (Signed)
Candace Cervantes, RUDDEN NO.:  [id number]   MEDICAL RECORD NO.:  [id number]          PATIENT TYPE:  INP   LOCATION:  A204                          FACILITY:  [facility]   PHYSICIAN:  Vida Roller, M.D.   DATE OF BIRTH:  11/15/1924   DATE OF PROCEDURE:  DATE OF DISCHARGE:                                  ECHOCARDIOGRAM   PRIMARY CARE PHYSICIAN:  Dr. Vania Rea.   TAPE NUMBER:  [id number], tape count [id number].   INDICATION:  A 77 year old woman who was admitted for chest discomfort,  ruled out by myocardial infarction, who was evaluated with an echocardiogram  that showed normal left ventricular systolic function.  This is an  evaluation for ischemia. Of note, she had a Cardiolite in April of this year  which showed no evidence of ischemia and normal ejection fraction.  She has  a pacemaker in place, and is atrially paced.   DETAILS OF PROCEDURE:  The patient was brought to the echocardiographic  laboratory where images were obtained in the apical 4, apical 2, parasternal  long, and parasternal short axis.  Then the patient was exercised on Bruce  protocol.  Then immediately after the completion of that when she had  reached at least 85% of her maximum predicted heart rate for her age, she  was immediately moved back to the echocardiographic laboratory where she was  evaluated in the same views.  These views were used as a comparison with the  rest views to assess whether there was any stress induced wall motion  abnormality.   STRESS TEST:  The patient exercised 5 minutes and 18 seconds over Bruce  protocol, attaining 5 mets of exercise.  Her heart rate increased from 78  beats a minute to 122 beats a minute which is 85% of her maximum predicted  heart rate for her age.  Her blood pressure increased from 140/62, to  178/72, giving a double product of 21.7 thousand.  During that time, she had  no symptoms other than significant fatigue.  Her electrocardiogram  revealed  an atrially paced rhythm.  She did have some anterolateral ST segment  depression which occurred in recovery, and resolved approximately 6 minutes  out.  Rest images revealed normal systolic function with no significant  ventricular wall motion abnormalities.  The patient is atrially paced, but  ventricularly, she uses her normal conduction system. Her ejection fraction  is estimated at about 65% with no wall-motion abnormalities.   Stress images reveal appropriate augmentation of left ventricular systolic  function.  There is generally significant augmentation to systolic function.  There is, however, some evidence of inferior apical hypokinesis seen most  easily seen in the apical two-chamber view.   INTERPRETATION:  This is an evaluation concerning for ischemia both  electrically and echocardiographically, and invasive assessment is  recommended.    [name]  JH/MEDQ  D:  08/15/2004  T:  08/15/2004  Job:  [id number]

## 2011-02-20 NOTE — H&P (Signed)
   NAMEDONA, WALBY                          ACCOUNT NO.:  [id number]   MEDICAL RECORD NO.:  [id number]                   PATIENT TYPE:  AMB   LOCATION:  DAY                                  FACILITY:  [facility]   PHYSICIAN:  Jerolyn Shin C. Katrinka Blazing, M.D.                DATE OF BIRTH:  June 01, 1925   DATE OF ADMISSION:  DATE OF DISCHARGE:                                HISTORY & PHYSICAL   HISTORY OF PRESENT ILLNESS:  [age]-year-old female referred for  screening colonoscopy.  She has had recent onset of fecal incontinence.  She  has not had abdominal pain.  She denies diarrhea.  She has a brother with a  history of colon cancer.  She is scheduled for colonoscopy.   PAST MEDICAL HISTORY:  Past history positive for hypertension,  hyperlipidemia, symptomatic bradycardia, status post permanent pacemaker  placement, gastroesophageal reflux disease, depression with anxiety.   PAST SURGICAL HISTORY:  Cholecystectomy, hysterectomy, bilateral salpingo-  oophorectomy.   MEDICATIONS:  1. Kay Ciel 10 mEq every day.  2. Norvasc 5 mg every day.  3. Xanax 0.25 mg q.h.s. p.r.n.  4. Lipitor 20 mg q.h.s.  5. Os-Cal one every day.  6. Dyazide 37.5/25 mg every day.   PHYSICAL EXAMINATION:  GENERAL:  On examination, she is a pleasant, elderly  female in no acute distress.  VITAL SIGNS:  Blood pressure 118/76, pulse 80, respirations 20.  Weight 185  pounds.  HEENT:  Unremarkable.  NECK:  Neck is supple without JVD or bruit.  CHEST:  Chest is clear to auscultation.  No rales, rubs, rhonchi or wheezes.  HEART:  Regular rate and rhythm without murmur, gallop or rub.  ABDOMEN:  Abdomen is soft, nontender.  No masses.  EXTREMITIES:  No cyanosis, clubbing or edema.  NEUROLOGIC:  No focal neurologic deficits.   IMPRESSION:  1. Need for screening colonoscopy.  2. Family history of colon cancer.  3. Fecal incontinence.  4. Hypertension.  5.     Gastroesophageal reflux disease.  6. Depression with  anxiety.   PLAN:  Screening colonoscopy.                                               Dirk Dress. Katrinka Blazing, M.D.    LCS/MEDQ  D:  07/25/2002  T:  07/26/2002  Job:  [id number]

## 2011-02-20 NOTE — Discharge Summary (Signed)
NAMEJAIAH, WEIGEL NO.:  [id number]   MEDICAL RECORD NO.:  [id number]          PATIENT TYPE:  OIB   LOCATION:  2899                         FACILITY:  [facility]   PHYSICIAN:  Doylene Canning. Ladona Ridgel, MD    DATE OF BIRTH:  1925-04-06   DATE OF ADMISSION:  06/30/2006  DATE OF DISCHARGE:  06/30/2006                                 DISCHARGE SUMMARY   PRINCIPAL DIAGNOSIS:  1. 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  2. Explant of existing generator with implant of St. Jude Victory XL DR      dual-chamber generator for device at elective replacement indicator.   SECONDARY DIAGNOSES:  1. History of atrial fibrillation.  2. Symptomatic bradycardia.  3. Hypertension.  4. Dyslipidemia.   PROCEDURE:  June 30, 2006 explant of existing St. Jude permanent  pacemaker with implant of St. Jude Victory XL DR dual-chamber pacemaker  generator by Dr. Lewayne Bunting.   BRIEF HISTORY:  Mr. Sprecher is a 77 year old female.  She has a history of  symptomatic tachybrady syndrome.  She is status post permanent pacemaker  insertion 2002.  She is now admitted for pacemaker generator change. The  patient on admission denies chest pain, shortness of breath. She has a  history of underlying atrial fibrillation currently in sinus rhythm. She has  hypertension and dyslipidemia.   HOSPITAL COURSE:  The patient presented electively June 30, 2006.  She  underwent explantation of her existing pacemaker generator with implantation  of a new [employer].  There is no hematoma at the pacer  pocket at that time of discharge. She has only mild discomfort at the  generator site.  She is asked to remove the bandage in the morning,  Thursday, September 27, and leave the incision open to the air.  She is not  to drive for the next week.  She is not to lift anything heavier than 10  pounds for the next 4 weeks.  She is to keep her incision dry for next 7  days to sponge bathe until Wednesday, October 3.  She has follow-up with  [facility] [address] at the [facility] Wednesday,  October 10 at 10:20 the morning.   DISCHARGE MEDICATIONS:  New medication antibiotic Keflex 500 mg 1 tablet 1/2  hour before breakfast, lunch, dinner, and bedtime.  She will also resume her  regular medications which are:  1. Lipitor 10 mg daily at bedtime.  2. Maxzide 37.5/25 1 tablet daily.  3. Lisinopril 20 mg daily.  4. K-Dur for potassium 10 mEq daily.  5. Enteric-coated aspirin 81 mg daily.  6. Metoclopramide 5 mg four times daily.  7. Multivitamin daily.   LABS PERTINENT TO THIS ADMISSION:  Were drawn on June 08, 2006. CBC was  white cells 4.6, hemoglobin 12.4, hematocrit 38.1, platelets 214. Serum  electrolytes; sodium 143, potassium 4.1, chloride 105,  carbonate 26, glucose is 96, BUN 16, creatinine 0.98.  Cholesterol was 189,  triglycerides 75, HDL cholesterol 76, LDL cholesterol 98, alkaline  phosphatase 60, SGOT was 20,  SGPT 15.   20-minute dictation.     ______________________________  Maple Mirza, PA    ______________________________  Doylene Canning. Ladona Ridgel, MD    GM/MEDQ  D:  06/30/2006  T:  07/02/2006  Job:  [id number]   cc:   Milus Mallick. Lodema Hong, M.D.  Gerrit Friends. Dietrich Pates, MD, [facility]  Doylene Canning. Ladona Ridgel, MD
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
## 2011-02-20 NOTE — Assessment & Plan Note (Signed)
[facility]                         ELECTROPHYSIOLOGY OFFICE NOTE   NAME:JONESZehava, Candace Cervantes                       MRN:          [id number]  DATE:11/24/2006                            DOB:          [birth year]    Candace Cervantes returns today for follow-up.   She is a very pleasant elderly woman with hypertension and symptomatic  bradycardia as well as paroxysmal atrial fibrillation, who is status  post insertion of a St. Jude Victory dual chamber pacemaker. She returns  today for follow-up. She does note some itching at the site of her  pacemaker incision but otherwise had no complaints and has no syncope.  She notes that if she ambulates for long periods of time she becomes  dyspneic but otherwise has been stable.   PHYSICAL EXAMINATION:  GENERAL: On exam today she is a pleasant elderly  woman in no distress. Blood pressure was 120/70, pulse was 70 and  regular, respirations 18 and weight 188 pounds.  NECK: Revealed no jugular venous distension.  LUNGS: Clear bilaterally to auscultation without wheezes, rales or  rhonchi.  CARDIOVASCULAR EXAM: Revealed a regular rate and rhythm with normal S1  and S2.  EXTREMITIES: Demonstrated no cyanosis, clubbing or edema.   MEDICATIONS:  1. Lipitor.  2. Maxzide.  3. Lisinopril.  4. Potassium.  5. Aspirin.   INTERROGATION OF PACEMAKER:  Demonstrates a St. Jude Victory with P  waves of 3 and R waves of 60 and impedance 400 in the atrium, 468 in the  ventricle, the threshold is 0.5 with 0.5 in the atrium, 1.2 at 0.5 in  the right ventricle. Battery voltage 2.8 volts. The patient's programmed  DVIR with an AV delay of 350 milliseconds.   IMPRESSION:  1. Symptomatic bradycardia.  2. Paroxysmal atrial fibrillation.  3. Status post pacemaker insertion.   DISCUSSION:  Overall, Candace Cervantes is stable. She has had no atrial  fibrillation since her pacemaker was placed. I gave her some  hydrocortisone cream for her  pacemaker incision.  We will see her back  in 9 months.    Candace Cervantes. Ladona Ridgel, MD  Electronically Signed   GWT/MedQ  DD: 11/24/2006  DT: 11/24/2006  Job #: [id number]

## 2011-02-20 NOTE — H&P (Signed)
NAMEROBEN, TATSCH                ACCOUNT NO.:  [id number]   MEDICAL RECORD NO.:  [id number]          PATIENT TYPE:  AMB   LOCATION:  DAY                           FACILITY:  [facility]   PHYSICIAN:  Jerolyn Shin C. Katrinka Blazing, M.D.   DATE OF BIRTH:  November 18, 1924   DATE OF ADMISSION:  DATE OF DISCHARGE:  LH                                HISTORY & PHYSICAL   HISTORY OF PRESENT ILLNESS:  A 77 year old female with a prior history of  rectal bleeding.  She had a colonoscopy in 2003 with no abnormal findings.  She has had stool guaiacs that were positive x2, and she has had 3 separate  fecal occult blood tests that were positive x2.  She denies constipation or  diarrhea.  There is a family history of colon cancer.  She denies pain with  bowel movements.  There has been no weight loss, and no change in appetite.  Because of the recurrent episodes of diarrhea, she is scheduled for  colonoscopy and upper endoscopy.  The patient does have a history of mild  dysphagia.   PAST HISTORY:  1.  She has a history of gastroesophageal reflux.  2.  Hypertension.  3.  Hyperlipidemia.  4.  Depression with anxiety.   SURGICAL HISTORY:  1.  Cholecystectomy.  2.  Hysterectomy.  3.  Bilateral salpingo-oophorectomy.  4.  Permanent pacemaker placement.   MEDICATIONS:  1.  Reglan 5 mg a.c. and h.s.  2.  Lipitor 10 mg q.h.s.  3.  Maxzide 37.5/25 daily.  4.  Xanax 0.25 mg q.h.s.  5.  Os-Cal plus D one daily.  6.  Klor-Con 10 mEq daily.  7.  Centrum liquid daily.   PHYSICAL EXAMINATION:  VITAL SIGNS:  Blood pressure 145/90, pulse 72,  respirations 18, weight 180 pounds.  HEENT:  Unremarkable.  NECK:  Supple.  No JVD, bruit, adenopathy, or thyromegaly.  CHEST:  Clear to auscultation.  No rales, rubs, rhonchi, or wheezes.  HEART:  Regular rate and rhythm without murmur, gallop, or rub.  ABDOMEN:  Soft, nontender.  No masses.  Normoactive bowel sounds.  EXTREMITIES:  No deformity.  No cyanosis, clubbing, or edema.   Good  peripheral pulses.  NEUROLOGIC:  No motor, sensory, or cerebellar deficits.   IMPRESSION:  1.  Recurrent rectal bleeding.  2.  Progressive dysphagia.  3.  Family history of colon cancer.  4.  Hypertension.  5.  Depression.  6.  Gastroesophageal reflux disease.   PLAN:  Upper endoscopy and total colonoscopy.      LCS/MEDQ  D:  11/19/2004  T:  11/20/2004  Job:  [id number]

## 2011-02-20 NOTE — Assessment & Plan Note (Signed)
[facility]                           ELECTROPHYSIOLOGY OFFICE NOTE   NAME:JONESBrigida, Scotti                       MRN:          [id number]  DATE:07/14/2006                            DOB:          03-05-1925    Ms. Konecny is seen today in the clinic on 14 July 2006 for a wound check  of her newly changed out St. Jude, [employer] Number [phone] [id number].  Date of  implant was June 30, 2006, for bradycardia with PAF.  On interrogation  of her device today, her batter voltage was 2.79.  P waves measured 3.4-3.7  mV with an atrial capture threshold 0.5 volts at 0.5 msec and atrial lead  impedance of 397 ohms.  R waves measured 7.8-8.2 mV with a ventricular  pacing threshold 1 volt at 0.5 msec and ventricular lead impedance of 505  ohms.  No changes were made in her parameters today.  Her Steri-Strips are  removed.  Her wound is without redness or edema, and she will follow up with  [name] in our [place] office in 3 months.      ______________________________  Altha Harm, LPN    ______________________________  Doylene Canning. Ladona Ridgel, MD    PO/MedQ  DD:  07/14/2006  DT:  07/15/2006  Job #:  [id number]

## 2011-02-20 NOTE — Procedures (Signed)
NAMEALFRETTA, PINCH NO.:  [id number]   MEDICAL RECORD NO.:  [id number]          PATIENT TYPE:  INP   LOCATION:  A204                          FACILITY:  [facility]   PHYSICIAN:  Vida Roller, M.D.   DATE OF BIRTH:  [birth year]   DATE OF PROCEDURE:  DATE OF DISCHARGE:                                    STRESS TEST   INDICATION:  Ms. Hurlock is a 77 year old female who was admitted to [facility] with complaints of chest discomfort.  She has no known  coronary artery disease.  She had an adenosine Cardiolite in April of 2005  which revealed no ischemia and an EF of 73%.  She has had three sets of  cardiac enzymes which are not consistent with acute myocardial infarction.   BASELINE DATA:  EKG revealed sinus rhythm at 60 beats per minute with some  nonspecific ST abnormalities and poor R wave progression.  Blood pressure  140/62.   DESCRIPTION OF PROCEDURE:  The patient exercised for a total of 5 minutes  and 18 seconds to 5.7 mets.  The second stage of Bruce protocol grade was  decreased secondary to fatigue.  The patient denied any chest discomfort or  shortness of breath with exercise.   EKG revealed an occasional paced rhythm.  She had some mildly-depressed ST  segments in the lateral leads and recovery.  These were very minimal and  nondiagnostic.   Final results and echocardiographic images are pending M.D. review.     Amy   AB/MEDQ  D:  08/15/2004  T:  08/15/2004  Job:  [id number]
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
## 2011-02-20 NOTE — Cardiovascular Report (Signed)
NAMENAFISA, OLDS                ACCOUNT NO.:  [id number]   MEDICAL RECORD NO.:  [id number]          PATIENT TYPE:  OIB   LOCATION:  6501                         FACILITY:  [facility]   PHYSICIAN:  Charlton Haws, M.D.     DATE OF BIRTH:  [birth year]   DATE OF PROCEDURE:  08/18/2004  DATE OF DISCHARGE:                              CARDIAC CATHETERIZATION   PROCEDURE:  Coronary arteriography.   INDICATION:  Recurrent chest pain.   CINE CATHETERIZATION:  Cine catheterization was done with 4-French catheters  from right femoral artery.   Left main coronary artery was normal.   Left anterior descending artery was normal.   Intermediate branch and the first 2 diagonal branches were normal.   Circumflex coronary artery was normal.  There were 2 serpiginous diffuse  marginal branches.  There was a 30% to 40% stenosis in the proximal branch  of the first marginal; the distal marginal was small without significant  disease.   The right coronary artery was dominant.  There was a single PDA and 1  posterolateral branch, but it was otherwise normal.   RIGHT ANTERIOR OBLIQUE VENTRICULOGRAPHY:  RAO ventriculography was normal.  EF was 65%.  There was no gradient across the aortic valve and no MR.  LV  pressure was 149/13.  Aortic pressure was 148/95.   IMPRESSION:  Patient has no significant coronary artery disease.  Her chest  pain would appear to be non-cardiac in etiology.  She will follow up with  Vida Roller for further risk factor modification.  She apparently sees  one of our pacemaker doctors for followup of her pacemaker which was placed  3 years ago in [place].  She tolerated the procedure well.       PN/MEDQ  D:  08/18/2004  T:  08/18/2004  Job:  [id number]   cc:   Vida Roller, M.D.  Fax: [phone]

## 2011-02-20 NOTE — Group Therapy Note (Signed)
NAMEROSILYN, COACHMAN NO.:  [id number]   MEDICAL RECORD NO.:  [id number]          PATIENT TYPE:  INP   LOCATION:  A204                          FACILITY:  [facility]   PHYSICIAN:  Vania Rea, M.D. DATE OF BIRTH:  1925-09-29   DATE OF PROCEDURE:  08/14/2004  DATE OF DISCHARGE:                                   PROGRESS NOTE   SUBJECTIVE:  She feels fine. Feels as if she could get up and about.   OBJECTIVE:  VITAL SIGNS:  Temperature 97.1, pulse 61, respiratory rate 20,  blood pressure 113/73.  CHEST:  Clear to auscultation bilaterally.  CARDIOVASCULAR:  Regular rhythm.  ABDOMEN:  Obese. Soft and non-tender.  EXTREMITIES:  Are without edema and 2+ pulses bilaterally.   LABORATORY DATA:  CBC has remained without significant change since  admission though a mild anemia. Her serum chemistry is entirely normal with  a creatinine of 0.9. Her hemoglobin A1C was 6.1. Her 3 sets of cardiac  enzymes are completely negative with no evidence of acute myocardial injury.  Her TSH is 2.3. Urinalysis showed no evidence of infection.   The patient has been seen by the cardiologist, who has recommended a cardiac  catheterization as the best option to further evaluate, however, the patient  and her husband have reportedly declined at this time. As a secondary  recommendation, the patient will be assessed for the value of a Cardiolite  study after reviewing her 2-D echocardiogram.   ASSESSMENT:  1.  Unstable angina by history. No evidence of myocardial infarction.  2.  Hypertension, controlled.  3.  History of hyperlipidemia, fasting lipid panel pending.   PLAN:  Await the results of the patient's 2-D echocardiogram and plan for a  stress Cardiolite in the morning.     Leop   LC/MEDQ  D:  08/14/2004  T:  08/14/2004  Job:  [id number]

## 2011-02-20 NOTE — Assessment & Plan Note (Signed)
[facility]                           ELECTROPHYSIOLOGY OFFICE NOTE   NAME:JONESRendi, Mapel                       MRN:          [id number]  DATE:06/17/2006                            DOB:          1925-09-21    Ms. Brazzel was seen today in the [place] clinic on June 17, 2006 for  followup of her [place]. Jude, model #1914, Identity.  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   On interrogation of her device today, her battery voltage is 2.48 which is  elective replacement.  P waves measured 3 millivolts with an atrial pacing  threshold of 0.75 volts at 0.4 milliseconds and an atrial lead impedance of  425 ohms.  R wave measured 7 millivolts with a ventricular pacing threshold  of 1 volt at 0.6 milliseconds and a ventricular lead impedance of 359.  No  changes were made in her parameters today.  She will be set up for a change  out.                                   Altha Harm, LPN                                Doylene Canning. Ladona Ridgel, MD   PO/MedQ  DD:  06/17/2006  DT:  06/18/2006  Job #:  [id number]

## 2011-02-24 ENCOUNTER — Other Ambulatory Visit: Payer: Self-pay | Admitting: Family Medicine

## 2011-02-24 ENCOUNTER — Encounter: Payer: MEDICARE | Admitting: Internal Medicine

## 2011-02-24 ENCOUNTER — Ambulatory Visit (HOSPITAL_COMMUNITY)
Admission: RE | Admit: 2011-02-24 | Discharge: 2011-02-24 | Disposition: A | Discharge status: Home / Self Care | Payer: Medicare Other | Source: Ambulatory Visit | Attending: Family Medicine | Admitting: Family Medicine

## 2011-02-24 DIAGNOSIS — Z139 Encounter for screening, unspecified: Secondary | ICD-10-CM

## 2011-02-24 DIAGNOSIS — Z1231 Encounter for screening mammogram for malignant neoplasm of breast: Secondary | ICD-10-CM | POA: Insufficient documentation

## 2011-02-24 LAB — CBC WITH DIFFERENTIAL/PLATELET
Basophils Relative: 0 % (ref 0–1)
HCT: 37 % (ref 36.0–46.0)
Hemoglobin: 12.5 g/dL (ref 12.0–15.0)
Lymphocytes Relative: 43 % (ref 12–46)
MCHC: 33.8 g/dL (ref 30.0–36.0)
Monocytes Relative: 7 % (ref 3–12)
Neutro Abs: 2.3 10*3/uL (ref 1.7–7.7)
Neutrophils Relative %: 49 % (ref 43–77)
RBC: 3.97 MIL/uL (ref 3.87–5.11)
WBC: 4.6 10*3/uL (ref 4.0–10.5)

## 2011-02-24 LAB — LIPID PANEL
Cholesterol: 177 mg/dL (ref 0–200)
LDL Cholesterol: 88 mg/dL (ref 0–99)
Total CHOL/HDL Ratio: 2.4 Ratio
Triglycerides: 73 mg/dL (ref <150)
VLDL: 15 mg/dL (ref 0–40)

## 2011-02-24 LAB — BASIC METABOLIC PANEL
Chloride: 104 mEq/L (ref 96–112)
Glucose, Bld: 97 mg/dL (ref 70–99)
Potassium: 3.5 mEq/L (ref 3.5–5.3)
Sodium: 140 mEq/L (ref 135–145)

## 2011-02-24 LAB — HEPATIC FUNCTION PANEL
ALT: 19 U/L (ref 0–35)
Total Protein: 7.2 g/dL (ref 6.0–8.3)

## 2011-02-24 LAB — TSH: TSH: 2.832 u[IU]/mL (ref 0.350–4.500)

## 2011-02-25 ENCOUNTER — Other Ambulatory Visit: Payer: Self-pay | Admitting: Family Medicine

## 2011-02-26 ENCOUNTER — Encounter: Payer: Self-pay | Admitting: Family Medicine

## 2011-02-26 ENCOUNTER — Other Ambulatory Visit (HOSPITAL_COMMUNITY)
Admission: RE | Admit: 2011-02-26 | Discharge: 2011-02-26 | Disposition: A | Discharge status: Home / Self Care | Payer: Medicare Other | Source: Ambulatory Visit | Attending: Family Medicine | Admitting: Family Medicine

## 2011-02-26 ENCOUNTER — Ambulatory Visit (INDEPENDENT_AMBULATORY_CARE_PROVIDER_SITE_OTHER): Payer: Medicare Other | Admitting: Family Medicine

## 2011-02-26 VITALS — BP 140/80 | HR 65 | Resp 16 | Ht 69.0 in | Wt 177.0 lb

## 2011-02-26 DIAGNOSIS — F068 Other specified mental disorders due to known physiological condition: Secondary | ICD-10-CM

## 2011-02-26 DIAGNOSIS — Z124 Encounter for screening for malignant neoplasm of cervix: Secondary | ICD-10-CM | POA: Insufficient documentation

## 2011-02-26 DIAGNOSIS — K219 Gastro-esophageal reflux disease without esophagitis: Secondary | ICD-10-CM

## 2011-02-26 DIAGNOSIS — I10 Essential (primary) hypertension: Secondary | ICD-10-CM

## 2011-02-26 DIAGNOSIS — Z0000 Encounter for general adult medical examination without abnormal findings: Secondary | ICD-10-CM

## 2011-02-26 DIAGNOSIS — Z1211 Encounter for screening for malignant neoplasm of colon: Secondary | ICD-10-CM

## 2011-02-26 DIAGNOSIS — E785 Hyperlipidemia, unspecified: Secondary | ICD-10-CM

## 2011-02-26 DIAGNOSIS — Z01419 Encounter for gynecological examination (general) (routine) without abnormal findings: Secondary | ICD-10-CM

## 2011-02-26 LAB — HEMOCCULT GUIAC POC 1CARD (OFFICE)

## 2011-02-26 NOTE — Patient Instructions (Signed)
F/u in 4 months.  Fasting labs are past due, we e cannot find them but will check the lab.  No med changes at this time  Labs, fasting chem7, lipids, hepatic

## 2011-03-03 ENCOUNTER — Encounter: Payer: Self-pay | Admitting: Family Medicine

## 2011-03-03 NOTE — Assessment & Plan Note (Signed)
Controlled, no change in medication  

## 2011-03-03 NOTE — Assessment & Plan Note (Signed)
Controlled on med at this time
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
## 2011-03-03 NOTE — Assessment & Plan Note (Signed)
Stable, no change in medication at this time

## 2011-03-06 ENCOUNTER — Encounter: Payer: Self-pay | Admitting: *Deleted

## 2011-03-13 ENCOUNTER — Other Ambulatory Visit: Payer: Self-pay | Admitting: Family Medicine

## 2011-03-24 ENCOUNTER — Ambulatory Visit (INDEPENDENT_AMBULATORY_CARE_PROVIDER_SITE_OTHER): Payer: Medicare Other | Admitting: Internal Medicine

## 2011-03-24 ENCOUNTER — Encounter: Payer: Self-pay | Admitting: Internal Medicine

## 2011-03-24 DIAGNOSIS — Z950 Presence of cardiac pacemaker: Secondary | ICD-10-CM

## 2011-03-24 DIAGNOSIS — I10 Essential (primary) hypertension: Secondary | ICD-10-CM

## 2011-03-24 DIAGNOSIS — I495 Sick sinus syndrome: Secondary | ICD-10-CM

## 2011-03-24 NOTE — Patient Instructions (Signed)
Your physician recommends that you schedule a follow-up appointment in: 1 year  

## 2011-03-25 ENCOUNTER — Encounter: Payer: Self-pay | Admitting: Internal Medicine

## 2011-03-25 DIAGNOSIS — Z950 Presence of cardiac pacemaker: Secondary | ICD-10-CM | POA: Insufficient documentation

## 2011-03-25 DIAGNOSIS — I10 Essential (primary) hypertension: Secondary | ICD-10-CM | POA: Insufficient documentation

## 2011-03-25 NOTE — Progress Notes (Signed)
HPI Candace Cervantes returns today for followup. She is a very pleasant 77 year old woman with a history of hypertension, symptomatic bradycardia, dyslipidemia, status post pacemaker insertion. She has nonobstructive coronary disease and preserved left ventricular systolic function. The patient denies chest pain, shortness of breath, or peripheral edema. She complains of mild arthritis in her shoulder and arm. No relationship to exertion. No syncope. No Known Allergies   Current Outpatient Prescriptions  Medication Sig Dispense Refill  . calcium-vitamin D (OSCAL WITH D 500-200) 500-200 MG-UNIT per tablet Take 1 tablet by mouth daily.        . CELEBREX 200 MG capsule TAKE ONE CAPSULE BY MOUTH EVERY DAY  30 capsule  1  . EXELON 9.5 MG/24HR APPLY 1 PATCH TO SKIN EVERY DAY  30 each  1  . lovastatin (MEVACOR) 40 MG tablet TAKE 1 TABLET BY MOUTH EVERY NIGHT AT BEDTIME  30 tablet  2  . meclizine (ANTIVERT) 25 MG tablet Take 25 mg by mouth as needed.        . nitroGLYCERIN (NITROSTAT) 0.4 MG SL tablet Place 0.4 mg under the tongue. One tablet under the tongue at onset of chest pains, repeat every five minutes as needed       . omeprazole (PRILOSEC) 20 MG capsule TAKE 1 CAPSULE BY MOUTH EVERY DAY  30 capsule  2  . potassium chloride SA (K-DUR,KLOR-CON) 20 MEQ tablet Take 20 mEq by mouth daily.        Marland Kitchen triamterene-hydrochlorothiazide (DYAZIDE) 37.5-25 MG per capsule Take 1 capsule by mouth daily. Take one and one half tablet by mouth once daily          Past Medical History  Diagnosis Date  . Pacemaker 2002    Permanent Placement  . SSS (sick sinus syndrome)   . Pacemaker 2007    Generator Changed   . Hyperlipidemia   . Hypertension   . CAD (coronary artery disease) 2005    Nonobstructive 40% RCA lesion and normal ejection fraction at cath   . Gastroesophageal reflux disease   . Osteoporosis   . Anxiety disorder   . Osteoarthritis     ROS:   All systems reviewed and negative except as noted in  the HPI.   Past Surgical History  Procedure Date  . Total abdominal hysterectomy 2002  . Cholecystectomy   . Cystectomy     from back of the neck  . Pacemaker insertion 2002  . Pacemaker placement 2007    replacement      Family History  Problem Relation Age of Onset  . Lung disease Sister   . Cancer Brother     prostate  . Heart disease Mother      History   Social History  . Marital Status: Married    Spouse Name: N/A    Number of Children: 2  . Years of Education: N/A   Occupational History  . Not on file.   Social History Main Topics  . Smoking status: Never Smoker   . Smokeless tobacco: Not on file  . Alcohol Use: No  . Drug Use: No  . Sexually Active: Not on file   Other Topics Concern  . Not on file   Social History Narrative  . No narrative on file     BP 131/75  Pulse 80  Wt 177 lb (80.287 kg)  Physical Exam:  Elderly, Well appearing NAD HEENT: Unremarkable Neck:  No JVD, no thyromegally Lymphatics:  No adenopathy Back:  No CVA  tenderness Lungs:  Clear. Well-healed pacemaker incision. HEART:  Regular rate rhythm, no murmurs, no rubs, no clicks Abd:  Flat, positive bowel sounds, no organomegally, no rebound, no guarding Ext:  2 plus pulses, no edema, no cyanosis, no clubbing Skin:  No rashes no nodules Neuro:  CN II through XII intact, motor grossly intact  DEVICE  Normal device function.  See PaceArt for details.   Assess/Plan:

## 2011-03-25 NOTE — Assessment & Plan Note (Signed)
Her blood pressure is well-controlled. I have asked the patient to continue a low-sodium diet and her current medications.

## 2011-03-25 NOTE — Assessment & Plan Note (Signed)
Her device is working normally. We will recheck the device in several months.

## 2011-04-07 ENCOUNTER — Telehealth: Payer: Self-pay | Admitting: Family Medicine

## 2011-04-07 NOTE — Telephone Encounter (Signed)
Results given.

## 2011-04-18 ENCOUNTER — Other Ambulatory Visit: Payer: Self-pay | Admitting: Family Medicine

## 2011-06-03 ENCOUNTER — Other Ambulatory Visit: Payer: Self-pay | Admitting: Family Medicine

## 2011-06-20 ENCOUNTER — Other Ambulatory Visit: Payer: Self-pay | Admitting: Cardiology

## 2011-06-20 ENCOUNTER — Other Ambulatory Visit: Payer: Self-pay | Admitting: Family Medicine

## 2011-06-22 ENCOUNTER — Other Ambulatory Visit: Payer: Self-pay

## 2011-06-22 MED ORDER — POTASSIUM CHLORIDE CRYS ER 20 MEQ PO TBCR: 20.0000 meq | EXTENDED_RELEASE_TABLET | Freq: Every day | ORAL | Status: DC

## 2011-06-24 ENCOUNTER — Other Ambulatory Visit: Payer: Self-pay

## 2011-06-24 ENCOUNTER — Telehealth: Payer: Self-pay | Admitting: Cardiology

## 2011-06-24 MED ORDER — POTASSIUM CHLORIDE 20 MEQ/15ML (10%) PO LIQD: 20.0000 meq | Freq: Every day | ORAL | Status: DC

## 2011-06-24 NOTE — Telephone Encounter (Signed)
PT NEEDS TO BE ON LIQUID FORM OF POTASSIUM SHE CAN NOT TAKE THE PILLS SHE STATES SHE HAS BEEN ON LIQUID FOR OVER A YEAR. PLEASE CALL NEW RX IN TO [facility]

## 2011-06-29 ENCOUNTER — Encounter: Payer: Self-pay | Admitting: Family Medicine

## 2011-06-30 ENCOUNTER — Ambulatory Visit: Payer: Medicare Other | Admitting: Family Medicine

## 2011-06-30 LAB — CBC
HCT: 32.4 — ABNORMAL LOW
Hemoglobin: 11.2 — ABNORMAL LOW
MCHC: 34.5
MCV: 92.7
Platelets: 177
Platelets: 186
RBC: 3.5 — ABNORMAL LOW
RDW: 14
WBC: 5.4
WBC: 6.8

## 2011-06-30 LAB — BASIC METABOLIC PANEL
BUN: 13
CO2: 28
Calcium: 9.4
Chloride: 107
Creatinine, Ser: 1.03
GFR calc Af Amer: >60
GFR calc non Af Amer: 52 — ABNORMAL LOW
Glucose, Bld: 109 — ABNORMAL HIGH
Potassium: 3.5
Sodium: 141

## 2011-06-30 LAB — DIFFERENTIAL
Basophils Absolute: 0
Basophils Absolute: 0
Basophils Relative: 0
Basophils Relative: 0
Eosinophils Absolute: 0.1
Eosinophils Absolute: 0.1
Eosinophils Relative: 1
Eosinophils Relative: 1
Lymphocytes Relative: 45
Lymphocytes Relative: 46
Lymphs Abs: 2.5
Monocytes Absolute: 0.4
Monocytes Absolute: 0.5
Monocytes Relative: 7
Neutro Abs: 2.5
Neutrophils Relative %: 46

## 2011-06-30 LAB — CARDIAC PANEL(CRET KIN+CKTOT+MB+TROPI)
CK, MB: 4.1 — ABNORMAL HIGH
Relative Index: 2.1

## 2011-06-30 LAB — COMPREHENSIVE METABOLIC PANEL
ALT: 20
AST: 25
Albumin: 3.9
Alkaline Phosphatase: 55
Chloride: 102
GFR calc Af Amer: 55 — ABNORMAL LOW
Potassium: 3.3 — ABNORMAL LOW
Sodium: 137
Total Bilirubin: 0.1 — ABNORMAL LOW

## 2011-06-30 LAB — CK TOTAL AND CKMB (NOT AT ARMC)
CK, MB: 4.3 — ABNORMAL HIGH
CK, MB: 4.4 — ABNORMAL HIGH
CK, MB: 5.5 — ABNORMAL HIGH
Relative Index: 1.9
Relative Index: 2.1
Relative Index: 2.2
Total CK: 202 — ABNORMAL HIGH
Total CK: 223 — ABNORMAL HIGH
Total CK: 257 — ABNORMAL HIGH

## 2011-06-30 LAB — POCT CARDIAC MARKERS
CKMB, poc: 2.6
Troponin i, poc: <0.05
Troponin i, poc: <0.05

## 2011-06-30 LAB — TSH: TSH: 3.337

## 2011-06-30 LAB — MAGNESIUM: Magnesium: 2.2

## 2011-07-13 ENCOUNTER — Telehealth: Payer: Self-pay

## 2011-07-13 NOTE — Telephone Encounter (Signed)
Check with pharmacy pls , if she has been getting this continuaklll;y, refill x 3 pls and let her know

## 2011-07-14 NOTE — Telephone Encounter (Signed)
The med was filled on 10/4, patient has not picked up yet

## 2011-07-16 ENCOUNTER — Other Ambulatory Visit: Payer: Self-pay | Admitting: Family Medicine

## 2011-07-16 MED ORDER — CITALOPRAM HYDROBROMIDE 10 MG PO TABS: 10.0000 mg | ORAL_TABLET | Freq: Every day | ORAL | Status: DC

## 2011-08-10 ENCOUNTER — Encounter: Payer: Self-pay | Admitting: Adult Health

## 2011-08-10 ENCOUNTER — Ambulatory Visit (INDEPENDENT_AMBULATORY_CARE_PROVIDER_SITE_OTHER): Payer: Medicare Other | Admitting: Adult Health

## 2011-08-10 DIAGNOSIS — I10 Essential (primary) hypertension: Secondary | ICD-10-CM

## 2011-08-10 DIAGNOSIS — I2510 Atherosclerotic heart disease of native coronary artery without angina pectoris: Secondary | ICD-10-CM

## 2011-08-10 DIAGNOSIS — R0789 Other chest pain: Secondary | ICD-10-CM

## 2011-08-10 DIAGNOSIS — Z950 Presence of cardiac pacemaker: Secondary | ICD-10-CM

## 2011-08-10 DIAGNOSIS — F411 Generalized anxiety disorder: Secondary | ICD-10-CM

## 2011-08-10 MED ORDER — NITROGLYCERIN 0.4 MG SL SUBL: 0.4000 mg | SUBLINGUAL_TABLET | SUBLINGUAL | Status: DC | PRN

## 2011-08-10 MED ORDER — ALPRAZOLAM 0.25 MG PO TABS: 0.2500 mg | ORAL_TABLET | Freq: Three times a day (TID) | ORAL | Status: DC | PRN

## 2011-08-10 NOTE — Patient Instructions (Addendum)
**Note De-identified  Obfuscation** Your physician recommends that you continue on your current medications as directed. Please refer to the Current Medication list given to you today.  Your physician recommends that you schedule a follow-up appointment in: 1 year  

## 2011-08-10 NOTE — Progress Notes (Signed)
HPI: Candace Cervantes is an anxious 77 y/o patient of doctors, Candace Cervantes and Candace Cervantes we are following for continued assessment and treatment of chest discomfort, symptomatic conduction disease s/p pacemaker St. Jude Victory XL DR model 5816 dual-chamber pacemaker, serial number [id number], placed by Candace Cervantes. He saw her last in June of 2012 for pacemaker check.  She comes today with recurrent chest discomfort which she relates to stress and anxiety in the care of her husband with dementia and family issues. She uses NTG prn and has, in the past, used Xanax which has also been helpful.  She missed her appointment with Dr. Lodema Cervantes last month but has a follow-up in a few weeks.    She denies DOE, diaphoresis, dizziness or weakness associated with the chest discomfort. Pain occurs with emotional upset and frustration only.   No Known Allergies  Current Outpatient Prescriptions  Medication Sig Dispense Refill  . ALPRAZolam (XANAX) 0.25 MG tablet Take 1 tablet (0.25 mg total) by mouth 3 (three) times daily as needed.  20 tablet  0  . calcium-vitamin D (OSCAL WITH D 500-200) 500-200 MG-UNIT per tablet Take 1 tablet by mouth daily.        . CELEBREX 200 MG capsule TAKE ONE CAPSULE BY MOUTH EVERY DAY  30 capsule  1  . EXELON 9.5 MG/24HR APPLY 1 PATCH TO SKIN EVERY DAY  30 each  2  . lovastatin (MEVACOR) 40 MG tablet TAKE 1 TABLET BY MOUTH EVERY NIGHT AT BEDTIME  30 tablet  2  . meclizine (ANTIVERT) 25 MG tablet TAKE 1 TABLET BY MOUTH TWICE DAILY AS NEEDED  40 tablet  2  . nitroGLYCERIN (NITROSTAT) 0.4 MG SL tablet Place 1 tablet (0.4 mg total) under the tongue every 5 (five) minutes as needed for chest pain. One tablet under the tongue at onset of chest pains, repeat every five minutes as needed  25 tablet  3  . omeprazole (PRILOSEC) 20 MG capsule TAKE 1 CAPSULE BY MOUTH EVERY DAY  30 capsule  2  . potassium chloride SA (K-DUR,KLOR-CON) 20 MEQ tablet Take 1 tablet (20 mEq total) by mouth daily.  30 tablet  6  .  triamterene-hydrochlorothiazide (DYAZIDE) 37.5-25 MG per capsule Take 1 capsule by mouth daily. Take one and one half tablet by mouth once daily       . triamterene-hydrochlorothiazide (MAXZIDE-25) 37.5-25 MG per tablet TAKE 1 1/2 TABLETS BY MOUTH EVERY DAY  45 tablet  2  . DISCONTD: nitroGLYCERIN (NITROSTAT) 0.4 MG SL tablet Place 0.4 mg under the tongue. One tablet under the tongue at onset of chest pains, repeat every five minutes as needed       . citalopram (CELEXA) 10 MG tablet Take 1 tablet (10 mg total) by mouth daily.  30 tablet  3  . potassium chloride 20 MEQ/15ML (10%) solution Take 15 mLs (20 mEq total) by mouth daily.  450 mL  11    Past Medical History  Diagnosis Date  . Pacemaker 2002    Permanent Placement  . SSS (sick sinus syndrome)   . Pacemaker 2007    Generator Changed   . Hyperlipidemia   . Hypertension   . CAD (coronary artery disease) 2005    Nonobstructive 40% RCA lesion and normal ejection fraction at cath   . Gastroesophageal reflux disease   . Osteoporosis   . Anxiety disorder   . Osteoarthritis     Past Surgical History  Procedure Date  . Total abdominal hysterectomy 2002  . Cholecystectomy   .  Cystectomy     from back of the neck  . Pacemaker insertion 2002  . Pacemaker placement 2007    replacement     JXB:JYNWGN of systems complete and found to be negative unless listed above PHYSICAL EXAM BP 154/79  Pulse 79  Wt 179 lb (81.194 kg) General: Well developed, well nourished, in no acute distress Head: Eyes PERRLA, No xanthomas.   Normal cephalic and atramatic  Lungs: Clear bilaterally to auscultation and percussion. Heart: HRRR S1 S2, without MRG.  Pulses are 2+ & equal.            No carotid bruit. No JVD.  No abdominal bruits. No femoral bruits. Abdomen: Bowel sounds are positive, abdomen soft and non-tender without masses or                  Hernia's noted. Msk:  Back normal, normal gait. Normal strength and tone for age. Extremities: No  clubbing, cyanosis or edema.  DP +1 Neuro: Alert and oriented X 3. Psych:  Good affect, responds appropriately  FAO:ZHYQM rhythm.  ASSESSMENT AND PLAN

## 2011-08-10 NOTE — Assessment & Plan Note (Signed)
Do not think her chest discomfort is related to CAD with cath with minimal CAD,  and stress test negative one year ago. No further testing is planned for her. She will see [place] in one year, and keep appointments with [name] as scheduled for pacemaker checks.

## 2011-08-10 NOTE — Assessment & Plan Note (Signed)
I have given her a Rx for xanax 0.25 mg with no refills, # 20.  She is to see Dr. Lodema Hong for more refills if she feels this will be helpful to her. This anxiety is contributing to chest discomfort and I think it would be helpful to her to have prn use of this.  However, Dr.Simpson will make final decision about continuation.

## 2011-08-11 ENCOUNTER — Ambulatory Visit (INDEPENDENT_AMBULATORY_CARE_PROVIDER_SITE_OTHER): Payer: Medicare Other

## 2011-08-11 DIAGNOSIS — Z23 Encounter for immunization: Secondary | ICD-10-CM

## 2011-08-17 ENCOUNTER — Telehealth: Payer: Self-pay | Admitting: Family Medicine

## 2011-08-17 ENCOUNTER — Ambulatory Visit (INDEPENDENT_AMBULATORY_CARE_PROVIDER_SITE_OTHER): Payer: Medicare Other | Admitting: Family Medicine

## 2011-08-17 ENCOUNTER — Other Ambulatory Visit: Payer: Self-pay | Admitting: Family Medicine

## 2011-08-17 VITALS — BP 134/82 | Wt 174.0 lb

## 2011-08-17 DIAGNOSIS — M25569 Pain in unspecified knee: Secondary | ICD-10-CM

## 2011-08-17 DIAGNOSIS — M25561 Pain in right knee: Secondary | ICD-10-CM

## 2011-08-17 DIAGNOSIS — I10 Essential (primary) hypertension: Secondary | ICD-10-CM

## 2011-08-17 DIAGNOSIS — Z23 Encounter for immunization: Secondary | ICD-10-CM

## 2011-08-17 MED ORDER — MELOXICAM 15 MG PO TABS: 15.0000 mg | ORAL_TABLET | Freq: Every day | ORAL | Status: DC

## 2011-08-17 NOTE — Telephone Encounter (Signed)
Also tdap per dr Lodema Hong

## 2011-08-17 NOTE — Telephone Encounter (Signed)
Pt seen in office for NV

## 2011-08-17 NOTE — Telephone Encounter (Signed)
Administer toradol 60 mg In, and I will send in an anti inflammatory as well, pls ensure she can weight bear on the foot

## 2011-08-17 NOTE — Progress Notes (Signed)
  Subjective:    Patient ID: Candace Cervantes, female    DOB: November 07, 1924, 77 y.o.   MRN: [id number]  HPI    Review of Systems     Objective:   Physical Exam  Swollen  And red great toe, no visible skin breakdown, no point tenderness, needs TdaP      Assessment & Plan:

## 2011-08-17 NOTE — Telephone Encounter (Signed)
Noted, thank you

## 2011-08-18 MED ORDER — KETOROLAC TROMETHAMINE 60 MG/2ML IM SOLN
60.0000 mg | Freq: Once | INTRAMUSCULAR | Status: AC
  Administered 2011-08-18: 60 mg via INTRAMUSCULAR

## 2011-09-01 ENCOUNTER — Other Ambulatory Visit: Payer: Self-pay | Admitting: Family Medicine

## 2011-09-07 ENCOUNTER — Encounter: Payer: Self-pay | Admitting: Internal Medicine

## 2011-09-07 ENCOUNTER — Ambulatory Visit (INDEPENDENT_AMBULATORY_CARE_PROVIDER_SITE_OTHER): Payer: Medicare Other | Admitting: *Deleted

## 2011-09-07 DIAGNOSIS — I495 Sick sinus syndrome: Secondary | ICD-10-CM

## 2011-09-07 LAB — PACEMAKER DEVICE OBSERVATION
AL THRESHOLD: 0.5 V
BAMS-0001: 180 {beats}/min
DEVICE MODEL PM: 1794510
RV LEAD THRESHOLD: 0.75 V

## 2011-10-02 ENCOUNTER — Other Ambulatory Visit: Payer: Self-pay | Admitting: Family Medicine

## 2011-10-31 ENCOUNTER — Other Ambulatory Visit: Payer: Self-pay | Admitting: Family Medicine

## 2011-11-18 ENCOUNTER — Ambulatory Visit (INDEPENDENT_AMBULATORY_CARE_PROVIDER_SITE_OTHER): Payer: Medicare Other | Admitting: Family Medicine

## 2011-11-18 VITALS — BP 128/74 | HR 70 | Resp 16 | Ht 69.0 in | Wt 180.0 lb

## 2011-11-18 DIAGNOSIS — I10 Essential (primary) hypertension: Secondary | ICD-10-CM

## 2011-11-18 DIAGNOSIS — M899 Disorder of bone, unspecified: Secondary | ICD-10-CM

## 2011-11-18 DIAGNOSIS — G44209 Tension-type headache, unspecified, not intractable: Secondary | ICD-10-CM | POA: Insufficient documentation

## 2011-11-18 DIAGNOSIS — E785 Hyperlipidemia, unspecified: Secondary | ICD-10-CM

## 2011-11-18 DIAGNOSIS — Z0100 Encounter for examination of eyes and vision without abnormal findings: Secondary | ICD-10-CM

## 2011-11-18 DIAGNOSIS — R519 Headache, unspecified: Secondary | ICD-10-CM | POA: Insufficient documentation

## 2011-11-18 DIAGNOSIS — H6120 Impacted cerumen, unspecified ear: Secondary | ICD-10-CM

## 2011-11-18 DIAGNOSIS — F068 Other specified mental disorders due to known physiological condition: Secondary | ICD-10-CM

## 2011-11-18 DIAGNOSIS — M949 Disorder of cartilage, unspecified: Secondary | ICD-10-CM

## 2011-11-18 DIAGNOSIS — R0989 Other specified symptoms and signs involving the circulatory and respiratory systems: Secondary | ICD-10-CM

## 2011-11-18 DIAGNOSIS — R51 Headache: Secondary | ICD-10-CM

## 2011-11-18 MED ORDER — LOVASTATIN 40 MG PO TABS: ORAL_TABLET | ORAL | Status: DC

## 2011-11-18 MED ORDER — TRIAMTERENE-HCTZ 37.5-25 MG PO TABS: ORAL_TABLET | ORAL | Status: DC

## 2011-11-18 NOTE — Patient Instructions (Addendum)
F/u in 4 month   You will be referred for a brain scan, since you report heaviness in the head and dizziness and poor vision.  You are being referred to dr Dione Booze for eye exam  You are referred for an ultrasound of your neck arteries

## 2011-11-18 NOTE — Progress Notes (Signed)
Bilateral ear flush. No sign of trauma or complications. Was rechecked by Dr before leaving

## 2011-11-23 ENCOUNTER — Ambulatory Visit (HOSPITAL_COMMUNITY)
Admission: RE | Admit: 2011-11-23 | Discharge: 2011-11-23 | Disposition: A | Discharge status: Home / Self Care | Payer: Medicare Other | Source: Ambulatory Visit | Attending: Family Medicine | Admitting: Family Medicine

## 2011-11-23 DIAGNOSIS — H538 Other visual disturbances: Secondary | ICD-10-CM | POA: Insufficient documentation

## 2011-11-23 DIAGNOSIS — R42 Dizziness and giddiness: Secondary | ICD-10-CM | POA: Insufficient documentation

## 2011-11-23 DIAGNOSIS — I10 Essential (primary) hypertension: Secondary | ICD-10-CM | POA: Insufficient documentation

## 2011-11-23 DIAGNOSIS — R0989 Other specified symptoms and signs involving the circulatory and respiratory systems: Secondary | ICD-10-CM | POA: Insufficient documentation

## 2011-11-23 DIAGNOSIS — R51 Headache: Secondary | ICD-10-CM

## 2011-11-24 ENCOUNTER — Encounter: Payer: Self-pay | Admitting: Family Medicine

## 2011-11-24 LAB — COMPLETE METABOLIC PANEL WITH GFR
ALT: 16 U/L (ref 0–35)
BUN: 19 mg/dL (ref 6–23)
CO2: 29 mEq/L (ref 19–32)
Calcium: 10 mg/dL (ref 8.4–10.5)
Chloride: 100 mEq/L (ref 96–112)
Creat: 1.06 mg/dL (ref 0.50–1.10)
GFR, Est African American: 56 mL/min — ABNORMAL LOW
GFR, Est Non African American: 48 mL/min — ABNORMAL LOW
Glucose, Bld: 102 mg/dL — ABNORMAL HIGH (ref 70–99)

## 2011-11-24 LAB — VITAMIN D 25 HYDROXY (VIT D DEFICIENCY, FRACTURES): Vit D, 25-Hydroxy: 43 ng/mL (ref 30–89)

## 2011-11-24 LAB — LIPID PANEL
Cholesterol: 189 mg/dL (ref 0–200)
Triglycerides: 66 mg/dL (ref <150)

## 2011-11-24 NOTE — Assessment & Plan Note (Signed)
Increased headache and disturbance in balance following fall, will refer for a scan

## 2011-11-24 NOTE — Assessment & Plan Note (Addendum)
Stable, but pt becoming increasingly challenged with caring for herself and aging spouse. Has had help from dementia program in the past

## 2011-11-24 NOTE — Assessment & Plan Note (Signed)
reports neurologic symptoms and has bruit, needs doppler

## 2011-11-24 NOTE — Progress Notes (Signed)
  Subjective:    Patient ID: Candace Cervantes, female    DOB: 11/08/1924, 78 y.o.   MRN: [id number]  HPI 2 month h/o heaviness in the eye with gait disturbance and imbalance following a fall. Experiencing recurrent lightheadeness, just does not feel right. Pt also here for rioutine follow up of chronic conditions and update of immunization and cancer screening   Review of Systems See HPI Denies recent fever or chills. Denies sinus pressure, nasal congestion, ear pain or sore throat. Denies chest congestion, productive cough or wheezing. Denies chest pains, palpitations and leg swelling Denies abdominal pain, nausea, vomiting,diarrhea or constipation.   Denies dysuria, frequency, hesitancy or incontinence. Denies joint pain, swelling and limitation in mobility. Denies depression,does have mild anxiety and  insomnia. Denies skin break down or rash.        Objective:   Physical Exam Patient alert and oriented and in no cardiopulmonary distress.  HEENT: No facial asymmetry, EOMI, no sinus tenderness,  oropharynx pink and moist.  Neck supple no adenopathy.Carotid bruit bilateral  Chest: Clear to auscultation bilaterally.  CVS: S1, S2 no murmurs, no S3.  ABD: Soft non tender. Bowel sounds normal.  Ext: No edema  MS: decreased  ROM spine, shoulders, hips and knees.  Skin: Intact, no ulcerations or rash noted.  Psych: Good eye contact, normal affect. Memory mildly impaired, mildly  anxious not depressed appearing.  CNS: CN 2-12 intact, power, tone and sensation normal throughout.        Assessment & Plan:

## 2011-11-24 NOTE — Assessment & Plan Note (Signed)
Hyperlipidemia:Low fat diet discussed and encouraged.  Updated lab shows slight elevation in LDL, counseled to reduce fatty food

## 2011-12-01 ENCOUNTER — Other Ambulatory Visit: Payer: Self-pay | Admitting: Family Medicine

## 2011-12-10 ENCOUNTER — Other Ambulatory Visit: Payer: Self-pay | Admitting: Adult Health

## 2012-03-16 ENCOUNTER — Other Ambulatory Visit: Payer: Self-pay | Admitting: Family Medicine

## 2012-03-16 DIAGNOSIS — Z139 Encounter for screening, unspecified: Secondary | ICD-10-CM

## 2012-03-17 ENCOUNTER — Ambulatory Visit (INDEPENDENT_AMBULATORY_CARE_PROVIDER_SITE_OTHER): Payer: Medicare Other | Admitting: Family Medicine

## 2012-03-17 ENCOUNTER — Encounter: Payer: Self-pay | Admitting: Family Medicine

## 2012-03-17 VITALS — BP 130/74 | HR 78 | Resp 18 | Ht 69.0 in | Wt 174.1 lb

## 2012-03-17 DIAGNOSIS — K219 Gastro-esophageal reflux disease without esophagitis: Secondary | ICD-10-CM

## 2012-03-17 DIAGNOSIS — I10 Essential (primary) hypertension: Secondary | ICD-10-CM

## 2012-03-17 DIAGNOSIS — E049 Nontoxic goiter, unspecified: Secondary | ICD-10-CM

## 2012-03-17 DIAGNOSIS — E785 Hyperlipidemia, unspecified: Secondary | ICD-10-CM

## 2012-03-17 DIAGNOSIS — F068 Other specified mental disorders due to known physiological condition: Secondary | ICD-10-CM

## 2012-03-17 MED ORDER — TRIAMTERENE-HCTZ 37.5-25 MG PO TABS: ORAL_TABLET | ORAL | Status: DC

## 2012-03-17 NOTE — Progress Notes (Signed)
  Subjective:    Patient ID: Candace Cervantes, female    DOB: [birth year], 78 y.o.   MRN: [id number]  HPI The PT is here for follow up and re-evaluation of chronic medical conditions, medication management and review of any available recent lab and radiology data.  Preventive health is updated, specifically  Cancer screening and Immunization.   Questions or concerns regarding consultations or procedures which the PT has had in the interim are  addressed. The PT denies any adverse reactions to current medications since the last visit.  There are no new concerns.States overall she is doing much better, using citalopram only "as needed", not as fatigued, still getting a bit aggravated at her spouse's forgetfullness  There are no specific complaints       Review of Systems See HPI Denies recent fever or chills. Denies sinus pressure, nasal congestion, ear pain or sore throat. Denies chest congestion, productive cough or wheezing. Denies chest pains, palpitations and leg swelling Denies abdominal pain, nausea, vomiting,diarrhea or constipation.   Denies dysuria, frequency, hesitancy or incontinence. Denies uncontrolled  joint pain, swelling and limitation in mobility. Denies headaches, seizures, numbness, or tingling. Denies uncontrolled  depression, anxiety or insomnia. Denies skin break down or rash.        Objective:   Physical Exam Patient alert and oriented and in no cardiopulmonary distress.  HEENT: No facial asymmetry, EOMI, no sinus tenderness,  oropharynx pink and moist.  Neck supple no adenopathy.  Chest: Clear to auscultation bilaterally.  CVS: S1, S2 no murmurs, no S3.  ABD: Soft non tender. Bowel sounds normal.  Ext: No edema  WU:[id number] ROM spine, shoulders, hips and knees.  Skin: Intact, no ulcerations or rash noted.  Psych: Good eye contact, normal affect. Memory mildly impaired not anxious or depressed appearing.  CNS: CN 2-12 intact, power, tone and  sensation normal throughout.        Assessment & Plan:

## 2012-03-17 NOTE — Patient Instructions (Addendum)
F/u  In 3 month, I am glad that you are doing better   Stop the citalopram.  No xanax   Fasting lipid,cmp, cbc, tsh

## 2012-03-20 NOTE — Assessment & Plan Note (Signed)
Controlled, no change in medication  

## 2012-03-20 NOTE — Assessment & Plan Note (Signed)
Hyperlipidemia:Low fat diet discussed and encouraged.  Controlled, no change in medication   

## 2012-03-20 NOTE — Assessment & Plan Note (Signed)
Stable on current medication , continue same 

## 2012-03-22 ENCOUNTER — Ambulatory Visit (HOSPITAL_COMMUNITY)
Admission: RE | Admit: 2012-03-22 | Discharge: 2012-03-22 | Disposition: A | Discharge status: Home / Self Care | Payer: Medicare Other | Source: Ambulatory Visit | Attending: Family Medicine | Admitting: Family Medicine

## 2012-03-22 DIAGNOSIS — Z1231 Encounter for screening mammogram for malignant neoplasm of breast: Secondary | ICD-10-CM | POA: Insufficient documentation

## 2012-03-22 DIAGNOSIS — Z139 Encounter for screening, unspecified: Secondary | ICD-10-CM

## 2012-04-11 ENCOUNTER — Encounter: Payer: Self-pay | Admitting: Internal Medicine

## 2012-04-11 ENCOUNTER — Ambulatory Visit (INDEPENDENT_AMBULATORY_CARE_PROVIDER_SITE_OTHER): Payer: Medicare Other | Admitting: Internal Medicine

## 2012-04-11 VITALS — BP 129/72 | HR 76 | Ht 70.0 in | Wt 176.4 lb

## 2012-04-11 DIAGNOSIS — I495 Sick sinus syndrome: Secondary | ICD-10-CM | POA: Insufficient documentation

## 2012-04-11 DIAGNOSIS — Z950 Presence of cardiac pacemaker: Secondary | ICD-10-CM

## 2012-04-11 DIAGNOSIS — I10 Essential (primary) hypertension: Secondary | ICD-10-CM

## 2012-04-11 DIAGNOSIS — I2510 Atherosclerotic heart disease of native coronary artery without angina pectoris: Secondary | ICD-10-CM

## 2012-04-11 LAB — PACEMAKER DEVICE OBSERVATION
AL AMPLITUDE: 1.2 mv
AL IMPEDENCE PM: 379 Ohm
AL THRESHOLD: 0.5 V
RV LEAD AMPLITUDE: 6.2 mv
RV LEAD IMPEDENCE PM: 479 Ohm

## 2012-04-11 NOTE — Assessment & Plan Note (Signed)
She has had some symptoms which are not related to exertion. I have asked her to call me if she has exertional chest pain.

## 2012-04-11 NOTE — Assessment & Plan Note (Signed)
Her blood pressure is well controlled. Will continue her current meds.  

## 2012-04-11 NOTE — Assessment & Plan Note (Signed)
Her device is working normally. Will recheck in several months. 

## 2012-04-11 NOTE — Progress Notes (Signed)
HPI Mrs. Slaven returns today for followup. She is a pleasant 78 yo woman with a h/o symptomatic bradycardia, s/p PPM, HTN, and dyslipidemia. The patient has done well in the interim. She has a new problem of panic attacks. These can occur at any time, are not associated with chest pressure and are associated with dyspnea. No edema. No syncope. She has minimal palpitations, none with her anxiety spells. No Known Allergies   Current Outpatient Prescriptions  Medication Sig Dispense Refill  . calcium-vitamin D (OSCAL WITH D 500-200) 500-200 MG-UNIT per tablet Take 1 tablet by mouth 3 (three) times daily.       . CELEBREX 200 MG capsule TAKE ONE CAPSULE BY MOUTH EVERY DAY  30 capsule  1  . citalopram (CELEXA) 10 MG tablet Take 10 mg by mouth daily.      . EXELON 9.5 MG/24HR APPLY 1 PATCH TOPICALLY TO SKIN EVERY DAY  30 each  6  . lovastatin (MEVACOR) 40 MG tablet TAKE 1 TABLET BY MOUTH EVERY NIGHT AT BEDTIME  30 tablet  3  . meclizine (ANTIVERT) 25 MG tablet TAKE 1 TABLET BY MOUTH TWICE DAILY AS NEEDED  40 tablet  2  . Multiple Vitamin (MULTIVITAMIN) LIQD Take 5 mLs by mouth daily.      . nitroGLYCERIN (NITROSTAT) 0.4 MG SL tablet Place 1 tablet (0.4 mg total) under the tongue every 5 (five) minutes as needed for chest pain. One tablet under the tongue at onset of chest pains, repeat every five minutes as needed  25 tablet  3  . omeprazole (PRILOSEC) 20 MG capsule TAKE 1 CAPSULE BY MOUTH EVERY DAY  30 capsule  3  . potassium chloride 20 MEQ/15ML (10%) solution Take 20 mEq by mouth daily.      Marland Kitchen triamterene-hydrochlorothiazide (MAXZIDE-25) 37.5-25 MG per tablet TAKE 1 1/2 TABLETS BY MOUTH EVERY DAY  45 tablet  3     Past Medical History  Diagnosis Date  . Pacemaker 2002    Permanent Placement  . SSS (sick sinus syndrome)   . Pacemaker 2007    Generator Changed   . Hyperlipidemia   . Hypertension   . CAD (coronary artery disease) 2005    Nonobstructive 40% RCA lesion and normal ejection  fraction at cath   . Gastroesophageal reflux disease   . Osteoporosis   . Anxiety disorder   . Osteoarthritis   . Sinoatrial node dysfunction     ROS:   All systems reviewed and negative except as noted in the HPI.   Past Surgical History  Procedure Date  . Total abdominal hysterectomy 2002  . Cholecystectomy   . Cystectomy     from back of the neck  . Pacemaker insertion 2002  . Pacemaker placement 2007    replacement      Family History  Problem Relation Age of Onset  . Lung disease Sister   . Cancer Brother     prostate  . Heart disease Mother      History   Social History  . Marital Status: Married    Spouse Name: N/A    Number of Children: 2  . Years of Education: N/A   Occupational History  . Not on file.   Social History Main Topics  . Smoking status: Never Smoker   . Smokeless tobacco: Not on file  . Alcohol Use: No  . Drug Use: No  . Sexually Active: Not on file   Other Topics Concern  . Not on file  Social History Narrative  . No narrative on file     BP 129/72  Pulse 76  Ht 5\' 10"  (1.778 m)  Wt 176 lb 6.4 oz (80.015 kg)  BMI 25.31 kg/m2  Physical Exam:  Well appearing elderly woman, NAD HEENT: Unremarkable Neck:  No JVD, no thyromegally Lungs:  Clear with no wheezes, rales, or rhonchi. Well healed PPM incision. HEART:  Regular rate rhythm, no murmurs, no rubs, no clicks Abd:  soft, positive bowel sounds, no organomegally, no rebound, no guarding Ext:  2 plus pulses, no edema, no cyanosis, no clubbing Skin:  No rashes no nodules Neuro:  CN II through XII intact, motor grossly intact  DEVICE  Normal device function.  See PaceArt for details.   Assess/Plan:

## 2012-04-11 NOTE — Patient Instructions (Addendum)
Your physician recommends that you continue on your current medications as directed. Please refer to the Current Medication list given to you today.  Your physician recommends that you schedule a follow-up appointment in: 6 months for device check and in 1 year with Dr. Taylor  

## 2012-04-13 ENCOUNTER — Encounter: Payer: Self-pay | Admitting: Internal Medicine

## 2012-05-18 ENCOUNTER — Encounter: Payer: Self-pay | Admitting: Gastroenterology

## 2012-06-07 ENCOUNTER — Other Ambulatory Visit: Payer: Self-pay | Admitting: Family Medicine

## 2012-06-14 ENCOUNTER — Encounter: Payer: Self-pay | Admitting: *Deleted

## 2012-06-17 ENCOUNTER — Encounter: Payer: Self-pay | Admitting: Family Medicine

## 2012-06-17 ENCOUNTER — Ambulatory Visit: Payer: Medicare Other | Admitting: Family Medicine

## 2012-06-20 ENCOUNTER — Other Ambulatory Visit: Payer: Self-pay | Admitting: Family Medicine

## 2012-06-20 LAB — CBC
Hemoglobin: 12.6 g/dL (ref 12.0–15.0)
MCH: 30.7 pg (ref 26.0–34.0)
RBC: 4.11 MIL/uL (ref 3.87–5.11)

## 2012-06-20 LAB — COMPREHENSIVE METABOLIC PANEL
Albumin: 4.3 g/dL (ref 3.5–5.2)
CO2: 32 mEq/L (ref 19–32)
Glucose, Bld: 103 mg/dL — ABNORMAL HIGH (ref 70–99)
Sodium: 141 mEq/L (ref 135–145)
Total Bilirubin: 0.5 mg/dL (ref 0.3–1.2)
Total Protein: 7.2 g/dL (ref 6.0–8.3)

## 2012-06-20 LAB — TSH: TSH: 2.942 u[IU]/mL (ref 0.350–4.500)

## 2012-06-20 LAB — LIPID PANEL
Cholesterol: 199 mg/dL (ref 0–200)
VLDL: 13 mg/dL (ref 0–40)

## 2012-06-22 ENCOUNTER — Ambulatory Visit (INDEPENDENT_AMBULATORY_CARE_PROVIDER_SITE_OTHER): Payer: Medicare Other | Admitting: Family Medicine

## 2012-06-22 ENCOUNTER — Encounter: Payer: Self-pay | Admitting: Family Medicine

## 2012-06-22 VITALS — BP 138/78 | HR 77 | Resp 18 | Ht 69.0 in | Wt 175.1 lb

## 2012-06-22 DIAGNOSIS — Z23 Encounter for immunization: Secondary | ICD-10-CM

## 2012-06-22 DIAGNOSIS — K219 Gastro-esophageal reflux disease without esophagitis: Secondary | ICD-10-CM

## 2012-06-22 DIAGNOSIS — R7301 Impaired fasting glucose: Secondary | ICD-10-CM

## 2012-06-22 DIAGNOSIS — I10 Essential (primary) hypertension: Secondary | ICD-10-CM

## 2012-06-22 DIAGNOSIS — M1610 Unilateral primary osteoarthritis, unspecified hip: Secondary | ICD-10-CM

## 2012-06-22 DIAGNOSIS — E785 Hyperlipidemia, unspecified: Secondary | ICD-10-CM

## 2012-06-22 DIAGNOSIS — F068 Other specified mental disorders due to known physiological condition: Secondary | ICD-10-CM

## 2012-06-22 NOTE — Progress Notes (Signed)
  Subjective:    Patient ID: Candace Cervantes, female    DOB: 1925-06-16, 78 y.o.   MRN: [id number]  HPI The PT is here for follow up and re-evaluation of chronic medical conditions, medication management and review of any available recent lab and radiology data.  Preventive health is updated, specifically  Cancer screening and Immunization.   Questions or concerns regarding consultations or procedures which the PT has had in the interim are  addressed. The PT denies any adverse reactions to current medications since the last visit.  There are no new concerns.  There are no specific complaints       Review of Systems See HPI Denies recent fever or chills. Denies sinus pressure, nasal congestion, ear pain or sore throat. Denies chest congestion, productive cough or wheezing. Denies chest pains, palpitations and leg swelling Denies abdominal pain, nausea, vomiting,diarrhea or constipation.   Denies dysuria, frequency, hesitancy or incontinence. Denies uncontrolled  joint pain, swelling and at times she has  limitation in mobility. Denies headaches, seizures, numbness, or tingling. Denies depression, anxiety or insomnia. Denies skin break down or rash.        Objective:   Physical Exam  Patient alert and oriented and in no cardiopulmonary distress.  HEENT: No facial asymmetry, EOMI, no sinus tenderness,  oropharynx pink and moist.  Neck supple no adenopathy.  Chest: Clear to auscultation bilaterally.  CVS: S1, S2 no murmurs, no S3.  ABD: Soft non tender. Bowel sounds normal.  Ext: No edema  MS:  Though reduced ROM spine, shoulders, hips and knees.  Skin: Intact, no ulcerations or rash noted.  Psych: Good eye contact, normal affect. Memory intact not anxious or depressed appearing.  CNS: CN 2-12 intact, power, tone and sensation normal throughout.       Assessment & Plan:

## 2012-06-22 NOTE — Patient Instructions (Addendum)
Annual wellness in 4.month, please call if you need me before   Please cut back on bread, and sweet drinks since you do not want to be diabetic  Rectal exam today.  Flu vaccine today

## 2012-06-26 NOTE — Assessment & Plan Note (Signed)
Adequate pain management with anti inflammatory

## 2012-06-26 NOTE — Assessment & Plan Note (Addendum)
Hyperlipidemia:Low fat diet discussed and encouraged.  Uncontrolled, elevated LDL, no change in medication

## 2012-06-26 NOTE — Assessment & Plan Note (Signed)
Controlled, no change in medication  

## 2012-06-26 NOTE — Assessment & Plan Note (Signed)
Encouraged increased community involvement, continue exelon

## 2012-06-26 NOTE — Assessment & Plan Note (Signed)
Controlled, no change in medication DASH diet and commitment to daily physical activity for a minimum of 30 minutes discussed and encouraged, as a part of hypertension management. The importance of attaining a healthy weight is also discussed.  

## 2012-06-28 ENCOUNTER — Other Ambulatory Visit: Payer: Self-pay | Admitting: Cardiology

## 2012-07-09 ENCOUNTER — Other Ambulatory Visit: Payer: Self-pay | Admitting: Family Medicine

## 2012-07-16 ENCOUNTER — Emergency Department (HOSPITAL_COMMUNITY): Payer: Medicare Other

## 2012-07-16 ENCOUNTER — Observation Stay (HOSPITAL_COMMUNITY)
Admission: EM | Admit: 2012-07-16 | Discharge: 2012-07-17 | Disposition: A | Discharge status: Home / Self Care | Payer: Medicare Other | Attending: Internal Medicine | Admitting: Internal Medicine

## 2012-07-16 ENCOUNTER — Encounter (HOSPITAL_COMMUNITY): Payer: Self-pay | Admitting: *Deleted

## 2012-07-16 DIAGNOSIS — D179 Benign lipomatous neoplasm, unspecified: Secondary | ICD-10-CM

## 2012-07-16 DIAGNOSIS — Z79899 Other long term (current) drug therapy: Secondary | ICD-10-CM | POA: Insufficient documentation

## 2012-07-16 DIAGNOSIS — K225 Diverticulum of esophagus, acquired: Secondary | ICD-10-CM

## 2012-07-16 DIAGNOSIS — B379 Candidiasis, unspecified: Secondary | ICD-10-CM

## 2012-07-16 DIAGNOSIS — R0602 Shortness of breath: Secondary | ICD-10-CM | POA: Insufficient documentation

## 2012-07-16 DIAGNOSIS — R0989 Other specified symptoms and signs involving the circulatory and respiratory systems: Secondary | ICD-10-CM

## 2012-07-16 DIAGNOSIS — G3184 Mild cognitive impairment, so stated: Secondary | ICD-10-CM | POA: Diagnosis present

## 2012-07-16 DIAGNOSIS — M25519 Pain in unspecified shoulder: Secondary | ICD-10-CM

## 2012-07-16 DIAGNOSIS — Z950 Presence of cardiac pacemaker: Secondary | ICD-10-CM

## 2012-07-16 DIAGNOSIS — H547 Unspecified visual loss: Secondary | ICD-10-CM

## 2012-07-16 DIAGNOSIS — R51 Headache: Secondary | ICD-10-CM

## 2012-07-16 DIAGNOSIS — M1990 Unspecified osteoarthritis, unspecified site: Secondary | ICD-10-CM

## 2012-07-16 DIAGNOSIS — R9431 Abnormal electrocardiogram [ECG] [EKG]: Secondary | ICD-10-CM

## 2012-07-16 DIAGNOSIS — R197 Diarrhea, unspecified: Secondary | ICD-10-CM

## 2012-07-16 DIAGNOSIS — I10 Essential (primary) hypertension: Secondary | ICD-10-CM

## 2012-07-16 DIAGNOSIS — R5383 Other fatigue: Secondary | ICD-10-CM

## 2012-07-16 DIAGNOSIS — M533 Sacrococcygeal disorders, not elsewhere classified: Secondary | ICD-10-CM

## 2012-07-16 DIAGNOSIS — F411 Generalized anxiety disorder: Secondary | ICD-10-CM

## 2012-07-16 DIAGNOSIS — E876 Hypokalemia: Secondary | ICD-10-CM

## 2012-07-16 DIAGNOSIS — K219 Gastro-esophageal reflux disease without esophagitis: Secondary | ICD-10-CM

## 2012-07-16 DIAGNOSIS — E049 Nontoxic goiter, unspecified: Secondary | ICD-10-CM

## 2012-07-16 DIAGNOSIS — E785 Hyperlipidemia, unspecified: Secondary | ICD-10-CM

## 2012-07-16 DIAGNOSIS — M1610 Unilateral primary osteoarthritis, unspecified hip: Secondary | ICD-10-CM

## 2012-07-16 DIAGNOSIS — R1310 Dysphagia, unspecified: Secondary | ICD-10-CM

## 2012-07-16 DIAGNOSIS — M25819 Other specified joint disorders, unspecified shoulder: Secondary | ICD-10-CM

## 2012-07-16 DIAGNOSIS — R079 Chest pain, unspecified: Principal | ICD-10-CM

## 2012-07-16 DIAGNOSIS — F068 Other specified mental disorders due to known physiological condition: Secondary | ICD-10-CM

## 2012-07-16 DIAGNOSIS — I495 Sick sinus syndrome: Secondary | ICD-10-CM

## 2012-07-16 DIAGNOSIS — D649 Anemia, unspecified: Secondary | ICD-10-CM | POA: Insufficient documentation

## 2012-07-16 DIAGNOSIS — R5381 Other malaise: Secondary | ICD-10-CM

## 2012-07-16 DIAGNOSIS — R413 Other amnesia: Secondary | ICD-10-CM | POA: Diagnosis present

## 2012-07-16 DIAGNOSIS — I2510 Atherosclerotic heart disease of native coronary artery without angina pectoris: Secondary | ICD-10-CM

## 2012-07-16 DIAGNOSIS — J309 Allergic rhinitis, unspecified: Secondary | ICD-10-CM

## 2012-07-16 LAB — COMPREHENSIVE METABOLIC PANEL
ALT: 13 U/L (ref 0–35)
Albumin: 3.9 g/dL (ref 3.5–5.2)
Alkaline Phosphatase: 57 U/L (ref 39–117)
Chloride: 101 mEq/L (ref 96–112)
Glucose, Bld: 112 mg/dL — ABNORMAL HIGH (ref 70–99)
Potassium: 3.6 mEq/L (ref 3.5–5.1)
Sodium: 138 mEq/L (ref 135–145)
Total Protein: 7.2 g/dL (ref 6.0–8.3)

## 2012-07-16 LAB — LIPID PANEL
HDL: 95 mg/dL (ref >39)
LDL Cholesterol: 68 mg/dL (ref 0–99)
Total CHOL/HDL Ratio: 1.9 RATIO
Triglycerides: 70 mg/dL (ref <150)

## 2012-07-16 LAB — CBC WITH DIFFERENTIAL/PLATELET
Basophils Relative: 0 % (ref 0–1)
Eosinophils Absolute: 0.1 10*3/uL (ref 0.0–0.7)
Lymphs Abs: 1.8 10*3/uL (ref 0.7–4.0)
MCH: 31.3 pg (ref 26.0–34.0)
Neutro Abs: 2.4 10*3/uL (ref 1.7–7.7)
Neutrophils Relative %: 53 % (ref 43–77)
Platelets: 176 10*3/uL (ref 150–400)
RBC: 3.77 MIL/uL — ABNORMAL LOW (ref 3.87–5.11)

## 2012-07-16 LAB — PRO B NATRIURETIC PEPTIDE: Pro B Natriuretic peptide (BNP): 161.8 pg/mL (ref 0–450)

## 2012-07-16 LAB — TROPONIN I: Troponin I: <0.3 ng/mL (ref <0.30)

## 2012-07-16 MED ORDER — ALPRAZOLAM 0.25 MG PO TABS: 0.2500 mg | ORAL_TABLET | Freq: Three times a day (TID) | ORAL | Status: DC | PRN

## 2012-07-16 MED ORDER — SODIUM CHLORIDE 0.9 % IJ SOLN
3.0000 mL | Freq: Two times a day (BID) | INTRAMUSCULAR | Status: DC
  Administered 2012-07-16 (×2): 3 mL via INTRAVENOUS
  Filled 2012-07-16 (×2): qty 3

## 2012-07-16 MED ORDER — RIVASTIGMINE 9.5 MG/24HR TD PT24
9.5000 mg | MEDICATED_PATCH | Freq: Every day | TRANSDERMAL | Status: DC
  Administered 2012-07-16 – 2012-07-17 (×2): 9.5 mg via TRANSDERMAL
  Filled 2012-07-16 (×3): qty 1

## 2012-07-16 MED ORDER — ISOSORBIDE MONONITRATE ER 60 MG PO TB24
30.0000 mg | ORAL_TABLET | Freq: Every day | ORAL | Status: DC
  Administered 2012-07-16 – 2012-07-17 (×2): 30 mg via ORAL
  Filled 2012-07-16 (×2): qty 1

## 2012-07-16 MED ORDER — ASPIRIN 81 MG PO CHEW
324.0000 mg | CHEWABLE_TABLET | Freq: Once | ORAL | Status: AC
  Administered 2012-07-16: 324 mg via ORAL
  Filled 2012-07-16: qty 4

## 2012-07-16 MED ORDER — ONDANSETRON HCL 4 MG/2ML IJ SOLN: 4.0000 mg | Freq: Four times a day (QID) | INTRAMUSCULAR | Status: DC | PRN

## 2012-07-16 MED ORDER — POTASSIUM CHLORIDE IN NACL 20-0.45 MEQ/L-% IV SOLN
INTRAVENOUS | Status: DC
  Administered 2012-07-16 (×2): via INTRAVENOUS
  Filled 2012-07-16 (×4): qty 1000

## 2012-07-16 MED ORDER — ONDANSETRON HCL 4 MG PO TABS: 4.0000 mg | ORAL_TABLET | Freq: Four times a day (QID) | ORAL | Status: DC | PRN

## 2012-07-16 MED ORDER — ACETAMINOPHEN 650 MG RE SUPP: 650.0000 mg | Freq: Four times a day (QID) | RECTAL | Status: DC | PRN

## 2012-07-16 MED ORDER — ENOXAPARIN SODIUM 40 MG/0.4ML ~~LOC~~ SOLN
40.0000 mg | SUBCUTANEOUS | Status: DC
  Administered 2012-07-16 – 2012-07-17 (×2): 40 mg via SUBCUTANEOUS
  Filled 2012-07-16 (×2): qty 0.4

## 2012-07-16 MED ORDER — ASPIRIN EC 325 MG PO TBEC
325.0000 mg | DELAYED_RELEASE_TABLET | Freq: Every day | ORAL | Status: DC
  Administered 2012-07-17: 325 mg via ORAL
  Filled 2012-07-16: qty 1

## 2012-07-16 MED ORDER — HYDROCODONE-ACETAMINOPHEN 5-325 MG PO TABS: 1.0000 | ORAL_TABLET | ORAL | Status: DC | PRN

## 2012-07-16 MED ORDER — NITROGLYCERIN 0.4 MG SL SUBL: 0.4000 mg | SUBLINGUAL_TABLET | SUBLINGUAL | Status: DC | PRN

## 2012-07-16 MED ORDER — PANTOPRAZOLE SODIUM 40 MG PO TBEC
40.0000 mg | DELAYED_RELEASE_TABLET | Freq: Every day | ORAL | Status: DC
  Administered 2012-07-16 – 2012-07-17 (×2): 40 mg via ORAL
  Filled 2012-07-16 (×2): qty 1

## 2012-07-16 MED ORDER — SIMVASTATIN 20 MG PO TABS
20.0000 mg | ORAL_TABLET | Freq: Every day | ORAL | Status: DC
  Administered 2012-07-16: 20 mg via ORAL
  Filled 2012-07-16: qty 1

## 2012-07-16 MED ORDER — LISINOPRIL 10 MG PO TABS
10.0000 mg | ORAL_TABLET | Freq: Every day | ORAL | Status: DC
  Administered 2012-07-16 – 2012-07-17 (×2): 10 mg via ORAL
  Filled 2012-07-16 (×2): qty 1

## 2012-07-16 MED ORDER — ACETAMINOPHEN 325 MG PO TABS
650.0000 mg | ORAL_TABLET | Freq: Four times a day (QID) | ORAL | Status: DC | PRN
  Administered 2012-07-17: 650 mg via ORAL
  Filled 2012-07-16: qty 2

## 2012-07-16 NOTE — H&P (Signed)
[facility] History and Physical  TRICHELLE LEHAN ZOX:[id number] DOB: [birth year] DOA: 07/16/2012  Referring physician: Dr. Bernette Mayers PCP: [name], MD  Specialists: Dr. Dietrich Pates, cardiology [name], electrophysiology  Chief Complaint: chest pain  HPI: Candace Cervantes is a 78 y.o. female with history of nonobstructive coronary artery disease, hypertension, hyperlipidemia, acid reflux, anxiety disorder. Patient was in her usual state of health when she woke up this morning at around 5:30 AM with substernal chest pain. She describes as a pressure type pain, nonradiating, occurring at rest. She had associated diaphoresis, felt like she may be short of breath. She says it was worse when she was laying on her back. She also had associated nausea and had one episode of vomiting. She took some sublingual nitroglycerin which initially helped her pain. She presents to the ER for evaluation where she had another episode of pain which resolves well. Currently she is chest pain-free. She reports taking nitroglycerin intermittently over the past few weeks for discomfort which has resolved. She feels that her pain may be related to anxiety since she's had multiple stresses in her life lately, including caring for her husband who has dementia. She also has a history of GERD and felt that this may also be a possible etiology. She is unsure if her pain is worsened by any exertion. She is evaluated in the emergency room her EKG was found to be nonacute, cardiac markers were also found to be negative. Due to her significant risk factors, history of coronary artery disease and advanced age, the patient for further workup of chest pain was requested.   Review of Systems: The patient denies anorexia, fever, weight loss,, vision loss, decreased hearing, hoarseness,  syncope, dyspnea on exertion, peripheral edema, balance deficits, hemoptysis, abdominal pain, melena, hematochezia, severe indigestion/heartburn,  hematuria, incontinence, genital sores, muscle weakness, suspicious skin lesions, transient blindness, difficulty walking, depression, unusual weight change, abnormal bleeding, enlarged lymph nodes, angioedema, and breast masses.    Past Medical History  Diagnosis Date  . Pacemaker 2002    Permanent Placement  . SSS (sick sinus syndrome)   . Pacemaker 2007    Generator Changed   . Hyperlipidemia   . Hypertension   . CAD (coronary artery disease) 2005    Nonobstructive 40% RCA lesion and normal ejection fraction at cath   . Gastroesophageal reflux disease   . Osteoporosis   . Anxiety disorder   . Osteoarthritis   . Sinoatrial node dysfunction    Past Surgical History  Procedure Date  . Total abdominal hysterectomy 2002  . Cholecystectomy   . Cystectomy     from back of the neck  . Pacemaker insertion 2002  . Pacemaker placement 2007    replacement    Social History:  reports that she has never smoked. She does not have any smokeless tobacco history on file. She reports that she does not drink alcohol or use illicit drugs. Ambulates independently  No Known Allergies  Family History  Problem Relation Age of Onset  . Lung disease Sister   . Cancer Brother     prostate  . Heart disease Mother      Prior to Admission medications   Medication Sig Start Date End Date Taking? Authorizing Provider  calcium-vitamin D (OSCAL WITH D 500-200) 500-200 MG-UNIT per tablet Take 1 tablet by mouth 3 (three) times daily.     Historical Provider, MD  CELEBREX 200 MG capsule TAKE ONE CAPSULE BY MOUTH EVERY DAY 03/13/11   Claris Che  Diana Eves, MD  citalopram (CELEXA) 10 MG tablet TAKE 1 TABLET BY MOUTH EVERY DAY 06/07/12   Kerri Perches, MD  EXELON 9.5 MG/24HR APPLY 1 PATCH TOPICALLY TO SKIN EVERY DAY 12/01/11   Kerri Perches, MD  lovastatin (MEVACOR) 40 MG tablet TAKE 1 TABLET BY MOUTH EVERY NIGHT AT BEDTIME 11/18/11   Kerri Perches, MD  meclizine (ANTIVERT) 25 MG tablet TAKE 1  TABLET BY MOUTH TWICE DAILY AS NEEDED 10/31/11   Kerri Perches, MD  Multiple Vitamin (MULTIVITAMIN) LIQD Take 5 mLs by mouth daily.    Historical Provider, MD  nitroGLYCERIN (NITROSTAT) 0.4 MG SL tablet Place 1 tablet (0.4 mg total) under the tongue every 5 (five) minutes as needed for chest pain. One tablet under the tongue at onset of chest pains, repeat every five minutes as needed 08/10/11   Jodelle Gross, NP  omeprazole (PRILOSEC) 20 MG capsule TAKE 1 CAPSULE BY MOUTH EVERY DAY 07/09/12   Kerri Perches, MD  potassium chloride 20 MEQ/15ML (10%) solution Take 20 mEq by mouth daily. 06/24/11   Kathlen Brunswick, MD  potassium chloride 20 MEQ/15ML (10%) solution TAKE BY MOUTH EVERY DAY 06/28/12   Kathlen Brunswick, MD  triamterene-hydrochlorothiazide [facility]) 37.5-25 MG per tablet TAKE 1 1/2 TABLETS BY MOUTH EVERY DAY 03/17/12   Kerri Perches, MD   Physical Exam: Filed Vitals:   07/16/12 5409 07/16/12 0659 07/16/12 0859  BP: 182/85  150/64  Pulse: 64  61  Temp:  98.6 F (37 C)   Resp: 20  16  Height: 5\' 11"  (1.803 m)    Weight: 81.194 kg (179 lb)    SpO2: 98%  99%     General:  No acute distress  Eyes: Pupils are equal round react to light  ENT: Mucous membranes are moist, no pharyngeal erythema  Neck: Supple, no palpable adenopathy  Cardiovascular: S1, S2, regular rate and rhythm  Respiratory: Some crackles at bases  Abdomen: Soft, nontender, nondistended, bowel sounds are active  Skin: Normal  Musculoskeletal: Deferred  Psychiatric: Normal affect, cooperative with exam  Neurologic: Grossly intact, nonfocal  Labs on Admission:  Basic Metabolic Panel:  Lab 07/16/12 8119  NA 138  K 3.6  CL 101  CO2 28  GLUCOSE 112*  BUN 16  CREATININE 1.07  CALCIUM 10.2  MG --  PHOS --   Liver Function Tests:  Lab 07/16/12 0720  AST 19  ALT 13  ALKPHOS 57  BILITOT 0.3  PROT 7.2  ALBUMIN 3.9   No results found for this basename:  LIPASE:5,AMYLASE:5 in the last 168 hours No results found for this basename: AMMONIA:5 in the last 168 hours CBC:  Lab 07/16/12 0720  WBC 4.5  NEUTROABS 2.4  HGB 11.8*  HCT 34.8*  MCV 92.3  PLT 176   Cardiac Enzymes:  Lab 07/16/12 0720  CKTOTAL --  CKMB --  CKMBINDEX --  TROPONINI <0.30    BNP (last 3 results) No results found for this basename: PROBNP:3 in the last 8760 hours CBG: No results found for this basename: GLUCAP:5 in the last 168 hours  Radiological Exams on Admission: Dg Chest Port 1 View  07/16/2012  *RADIOLOGY REPORT*  Clinical Data: Pacemaker  PORTABLE CHEST - 1 VIEW  Comparison: 07/14/2010  Findings: Dual lead left subclavian pacemaker device stable.  Mild cardiomegaly.  Increased linear markings at the lung bases are chronic.  Upper lungs clear.  No pneumothorax.  No pleural effusion.  IMPRESSION:  Cardiomegaly without CHF.  Stable pacemaker device.   Original Report Authenticated By: Donavan Burnet, M.D.     EKG: Independently reviewed. Atrial paced, nonacute  Assessment/Plan Principal Problem:  *Chest pain Active Problems:  HYPERLIPIDEMIA  DEMENTIA  ANXIETY DISORDER, GENERALIZED  HYPERTENSION  Pacemaker  CAD (coronary artery disease)  Sinoatrial node dysfunction   1. Chest pain. Etiology remains unclear. With her multiple risk factors, history of nonobstructive coronary disease, it is possible that her pain is cardiac in origin. She'll be admitted for observation to a telemetry bed. We'll cycle her cardiac markers and repeat EKG in the morning. She'll be provided aspirin and as needed sublingual nitroglycerin. I will also start her on Imdur. To further stratify her we will check hemoglobin A1c and lipid panel. Will have to avoid beta blocker for now, since her heart rate is already in the 60s. We will start her on low-dose ACE inhibitor. We'll also check a d-dimer, if this is positive we may pursue a CT angio of the chest. Other possible etiologies  include GERD versus anxiety related. She will be provided Protonix as well as needed Xanax. If the patient does not have any recurrence of pain, it may be reasonable to discharge her tomorrow and have her followup with the cardiology service. 2. Hypertension. She'll be provided with low dose ACE inhibitor. Hold triamterene for now since she appears mildly dehydrated 3. Hyperlipidemia. Continue statin and check lipid panel 4. Coronary artery disease, patient had cardiac catheterization in 2005 which showed nonobstructive disease. Please see plan as above 5. Dementia. This appears to be mild. Continue with Exelon patch  Code Status: Full code, confirmed the patient Family Communication: Discussed with patient and her daughter at the bedside Disposition Plan: Patient will be admitted for observation, likely discharge home once improved  Time spent:   MEMON,JEHANZEB [facility] Pager [phone]  If 7PM-7AM, please contact night-coverage [email or web address] Password [email or web address] 07/16/2012, 9:07 AM

## 2012-07-16 NOTE — ED Provider Notes (Signed)
History  This chart was scribed for Charles B. Bernette Mayers, MD by Shari Heritage. The patient was seen in room APA04/APA04. Patient's care was started at 0659.     CSN: [id number]  Arrival date & time 07/16/12  3086   First MD Initiated Contact with Patient 07/16/12 [phone]      Chief Complaint  Patient presents with  . Chest Pain    The history is provided by the patient. No language interpreter was used.    Candace Cervantes is a 78 y.o. female who presents to the Emergency Department complaining of intermittent, pressure-like, moderate mid-chest pain onset 1 hour ago at 6:00am. Patient states that she is not currently experiencing pain, but feels weak. At time of chest pain episodes there was associated diaphoresis, emesis (x1) and SOB. Patient denies leg swelling and nausea. Patient has a pacemaker, but denies history of major heart blockages. Patient's last heart screening was about 1 year ago. Other medical history includes anxiety disorder with panic attacks, nonobstructive coronary artery disease, hyperlipidemia, HTN, GERD, osteoporosis, osteoarthritis and sinoatrial node dysfunction.  PCP - Lodema Hong Cardiologist - [facility]  Past Medical History  Diagnosis Date  . Pacemaker 2002    Permanent Placement  . SSS (sick sinus syndrome)   . Pacemaker 2007    Generator Changed   . Hyperlipidemia   . Hypertension   . CAD (coronary artery disease) 2005    Nonobstructive 40% RCA lesion and normal ejection fraction at cath   . Gastroesophageal reflux disease   . Osteoporosis   . Anxiety disorder   . Osteoarthritis   . Sinoatrial node dysfunction     Past Surgical History  Procedure Date  . Total abdominal hysterectomy 2002  . Cholecystectomy   . Cystectomy     from back of the neck  . Pacemaker insertion 2002  . Pacemaker placement 2007    replacement     Family History  Problem Relation Age of Onset  . Lung disease Sister   . Cancer Brother     prostate  . Heart disease  Mother     History  Substance Use Topics  . Smoking status: Never Smoker   . Smokeless tobacco: Not on file  . Alcohol Use: No    OB History    Grav Para Term Preterm Abortions TAB SAB Ect Mult Living                  Review of Systems A complete 10 system review of systems was obtained and all systems are negative except as noted in the HPI and PMH.   Allergies  Review of patient's allergies indicates no known allergies.  Home Medications   Current Outpatient Rx  Name Route Sig Dispense Refill  . CALCIUM CARBONATE-VITAMIN D 500-200 MG-UNIT PO TABS Oral Take 1 tablet by mouth 3 (three) times daily.     . CELEBREX 200 MG PO CAPS  TAKE ONE CAPSULE BY MOUTH EVERY DAY 30 capsule 1  . CITALOPRAM HYDROBROMIDE 10 MG PO TABS  TAKE 1 TABLET BY MOUTH EVERY DAY 30 tablet 2  . EXELON 9.5 MG/24HR TD PT24  APPLY 1 PATCH TOPICALLY TO SKIN EVERY DAY 30 each 6  . LOVASTATIN 40 MG PO TABS  TAKE 1 TABLET BY MOUTH EVERY NIGHT AT BEDTIME 30 tablet 3  . MECLIZINE HCL 25 MG PO TABS  TAKE 1 TABLET BY MOUTH TWICE DAILY AS NEEDED 40 tablet 2  . ADULT MULTIVITAMIN LIQUID CH Oral Take 5 mLs  by mouth daily.    Marland Kitchen NITROGLYCERIN 0.4 MG SL SUBL Sublingual Place 1 tablet (0.4 mg total) under the tongue every 5 (five) minutes as needed for chest pain. One tablet under the tongue at onset of chest pains, repeat every five minutes as needed 25 tablet 3  . OMEPRAZOLE 20 MG PO CPDR  TAKE 1 CAPSULE BY MOUTH EVERY DAY 30 capsule 3  . POTASSIUM CHLORIDE 20 MEQ/15ML (10%) PO LIQD Oral Take 20 mEq by mouth daily.    Marland Kitchen POTASSIUM CHLORIDE 20 MEQ/15ML (10%) PO LIQD  TAKE BY MOUTH EVERY DAY 450 mL 2  . TRIAMTERENE-HCTZ 37.5-25 MG PO TABS  TAKE 1 1/2 TABLETS BY MOUTH EVERY DAY 45 tablet 3    BP 182/85  Pulse 64  Temp 98.6 F (37 C)  Resp 20  Ht 5\' 11"  (1.803 m)  Wt 179 lb (81.194 kg)  BMI 24.97 kg/m2  SpO2 98%  Physical Exam  Nursing note and vitals reviewed. Constitutional: She is oriented to person, place,  and time. She appears well-developed and well-nourished.  HENT:  Head: Normocephalic and atraumatic.  Eyes: EOM are normal. Pupils are equal, round, and reactive to light.  Neck: Normal range of motion. Neck supple.  Cardiovascular: Normal rate, normal heart sounds and intact distal pulses.   Pulmonary/Chest: Effort normal and breath sounds normal.  Abdominal: Bowel sounds are normal. She exhibits no distension. There is no tenderness.  Musculoskeletal: Normal range of motion. She exhibits no edema and no tenderness.  Neurological: She is alert and oriented to person, place, and time. She has normal strength. No cranial nerve deficit or sensory deficit.  Skin: Skin is warm and dry. No rash noted.  Psychiatric: She has a normal mood and affect.    ED Course  Procedures (including critical care time) DIAGNOSTIC STUDIES: Oxygen Saturation is 98% on room air, normal by my interpretation.    COORDINATION OF CARE: 7:15am- Patient informed of current plan for treatment and evaluation and agrees with plan at this time.   8:12am- Updated patient on current lab results. They are unremarkable, but recommended to patient that she stay in hospital overnight for further tests and monitoring based on her medical history. Patient agrees. Patient states that she hasn't experienced any more pain.  8:37am- Consult with Crista Curb at [facility]. Patient to be admitted.  Results for orders placed during the hospital encounter of 07/16/12  CBC WITH DIFFERENTIAL      Component Value Range   WBC 4.5  4.0 - 10.5 K/uL   RBC 3.77 (*) 3.87 - 5.11 MIL/uL   Hemoglobin 11.8 (*) 12.0 - 15.0 g/dL   HCT 40.9 (*) 81.1 - 91.4 %   MCV 92.3  78.0 - 100.0 fL   MCH 31.3  26.0 - 34.0 pg   MCHC 33.9  30.0 - 36.0 g/dL   RDW 78.2  95.6 - 21.3 %   Platelets 176  150 - 400 K/uL   Neutrophils Relative 53  43 - 77 %   Neutro Abs 2.4  1.7 - 7.7 K/uL   Lymphocytes Relative 40  12 - 46 %   Lymphs Abs 1.8  0.7 -  4.0 K/uL   Monocytes Relative 6  3 - 12 %   Monocytes Absolute 0.3  0.1 - 1.0 K/uL   Eosinophils Relative 1  0 - 5 %   Eosinophils Absolute 0.1  0.0 - 0.7 K/uL   Basophils Relative 0  0 - 1 %  Basophils Absolute 0.0  0.0 - 0.1 K/uL  COMPREHENSIVE METABOLIC PANEL      Component Value Range   Sodium 138  135 - 145 mEq/L   Potassium 3.6  3.5 - 5.1 mEq/L   Chloride 101  96 - 112 mEq/L   CO2 28  19 - 32 mEq/L   Glucose, Bld 112 (*) 70 - 99 mg/dL   BUN 16  6 - 23 mg/dL   Creatinine, Ser 1.47  0.50 - 1.10 mg/dL   Calcium 82.9  8.4 - 56.2 mg/dL   Total Protein 7.2  6.0 - 8.3 g/dL   Albumin 3.9  3.5 - 5.2 g/dL   AST 19  0 - 37 U/L   ALT 13  0 - 35 U/L   Alkaline Phosphatase 57  39 - 117 U/L   Total Bilirubin 0.3  0.3 - 1.2 mg/dL   GFR calc non Af Amer 46 (*) >90 mL/min   GFR calc Af Amer 53 (*) >90 mL/min  TROPONIN I      Component Value Range   Troponin I <0.30  <0.30 ng/mL     Dg Chest Port 1 View  07/16/2012  *RADIOLOGY REPORT*  Clinical Data: Pacemaker  PORTABLE CHEST - 1 VIEW  Comparison: 07/14/2010  Findings: Dual lead left subclavian pacemaker device stable.  Mild cardiomegaly.  Increased linear markings at the lung bases are chronic.  Upper lungs clear.  No pneumothorax.  No pleural effusion.  IMPRESSION: Cardiomegaly without CHF.  Stable pacemaker device.   Original Report Authenticated By: Donavan Burnet, M.D.      1. Chest pain       MDM  Pt has remained pain free in the ED. Last cath was in 2005 showed non-obstructive disease. Will plan for admission for rule out MI. Pt and family amenable.    Date: 07/16/2012  Rate: 63  Rhythm: atrially paced  QRS Axis: left  Intervals: normal  ST/T Wave abnormalities: nonspecific T wave changes  Conduction Disutrbances:first-degree A-V block   Narrative Interpretation:   Old EKG Reviewed: unchanged     I personally performed the services described in the documentation, which were scribed in my presence. The recorded  information has been reviewed and considered.    Charles B. Bernette Mayers, MD 07/16/12 1308

## 2012-07-16 NOTE — ED Notes (Signed)
Pt reports waking up about an hour ago, states she has pressure in middle of chest.  Denies radiation of pain anywhere.  Reports vomiting x1

## 2012-07-17 ENCOUNTER — Observation Stay (HOSPITAL_COMMUNITY): Payer: Medicare Other

## 2012-07-17 DIAGNOSIS — R9431 Abnormal electrocardiogram [ECG] [EKG]: Secondary | ICD-10-CM | POA: Diagnosis present

## 2012-07-17 DIAGNOSIS — F411 Generalized anxiety disorder: Secondary | ICD-10-CM

## 2012-07-17 DIAGNOSIS — E876 Hypokalemia: Secondary | ICD-10-CM | POA: Diagnosis present

## 2012-07-17 LAB — CBC
HCT: 32.8 % — ABNORMAL LOW (ref 36.0–46.0)
Hemoglobin: 10.9 g/dL — ABNORMAL LOW (ref 12.0–15.0)
MCH: 30.9 pg (ref 26.0–34.0)
MCHC: 33.2 g/dL (ref 30.0–36.0)
RDW: 13.8 % (ref 11.5–15.5)

## 2012-07-17 LAB — BASIC METABOLIC PANEL
BUN: 18 mg/dL (ref 6–23)
CO2: 26 mEq/L (ref 19–32)
Calcium: 9.6 mg/dL (ref 8.4–10.5)
Chloride: 102 mEq/L (ref 96–112)
Creatinine, Ser: 1.1 mg/dL (ref 0.50–1.10)
GFR calc Af Amer: 52 mL/min — ABNORMAL LOW (ref >90)
GFR calc non Af Amer: 44 mL/min — ABNORMAL LOW (ref >90)
Glucose, Bld: 107 mg/dL — ABNORMAL HIGH (ref 70–99)
Potassium: 3.4 mEq/L — ABNORMAL LOW (ref 3.5–5.1)
Sodium: 136 mEq/L (ref 135–145)

## 2012-07-17 LAB — HEMOGLOBIN A1C
Hgb A1c MFr Bld: 6 % — ABNORMAL HIGH (ref <5.7)
Mean Plasma Glucose: 126 mg/dL — ABNORMAL HIGH (ref <117)

## 2012-07-17 MED ORDER — POTASSIUM CHLORIDE IN NACL 20-0.9 MEQ/L-% IV SOLN
INTRAVENOUS | Status: DC
  Administered 2012-07-17: 10:00:00 via INTRAVENOUS

## 2012-07-17 MED ORDER — NITROGLYCERIN 0.4 MG SL SUBL: 0.4000 mg | SUBLINGUAL_TABLET | SUBLINGUAL | Status: DC | PRN

## 2012-07-17 MED ORDER — ALPRAZOLAM 0.25 MG PO TABS: 0.2500 mg | ORAL_TABLET | Freq: Two times a day (BID) | ORAL | Status: DC | PRN

## 2012-07-17 MED ORDER — ISOSORBIDE MONONITRATE ER 30 MG PO TB24: 30.0000 mg | ORAL_TABLET | Freq: Every day | ORAL | Status: DC

## 2012-07-17 MED ORDER — POTASSIUM CHLORIDE 20 MEQ/15ML (10%) PO LIQD: 20.0000 meq | Freq: Every day | ORAL | Status: DC

## 2012-07-17 MED ORDER — IOHEXOL 350 MG/ML SOLN
100.0000 mL | Freq: Once | INTRAVENOUS | Status: AC | PRN
  Administered 2012-07-17: 100 mL via INTRAVENOUS

## 2012-07-17 MED ORDER — POTASSIUM CHLORIDE CRYS ER 20 MEQ PO TBCR: 20.0000 meq | EXTENDED_RELEASE_TABLET | Freq: Two times a day (BID) | ORAL | Status: DC

## 2012-07-17 NOTE — Progress Notes (Signed)
Subjective: The patient is sitting up in bed. She has no complaints of chest pain. She states that overnight, while she was laying flat, she did have some central chest pressure. This is now resolved. She was given Tylenol which relieved her pain. She denies pleurisy or shortness of breath.  Objective: Vital signs in last 24 hours: Filed Vitals:   07/16/12 1500 07/16/12 2037 07/17/12 0437 07/17/12 0542  BP: 106/22 100/53 105/47 119/57  Pulse: 61 64 61 61  Temp: 97.9 F (36.6 C) 98.2 F (36.8 C) 98.1 F (36.7 C) 97.2 F (36.2 C)  TempSrc:  Oral Oral Oral  Resp: 16 18 18 18   Height:      Weight:      SpO2: 98% 97% 98% 97%    Intake/Output Summary (Last 24 hours) at 07/17/12 0946 Last data filed at 07/17/12 1610  Gross per 24 hour  Intake 2669.75 ml  Output   1200 ml  Net 1469.75 ml    Weight change: -1.394 kg (-3 lb 1.2 oz)  Physical exam: General: Pleasant 78 year old African-American woman sitting in bed, in no acute distress. Lungs: Clear to auscultation bilaterally. Heart: S1, S2, with no murmurs rubs or gallops. Abdomen: Positive bowel sounds, soft, nontender, nondistended. Extremities: Trace of pedal edema, nonpitting.  Lab Results: Basic Metabolic Panel:  Basename 07/17/12 0833 07/17/12 0511 07/16/12 0720  NA -- 136 138  K -- 3.4* 3.6  CL -- 102 101  CO2 -- 26 28  GLUCOSE -- 107* 112*  BUN -- 18 16  CREATININE -- 1.10 1.07  CALCIUM -- 9.6 10.2  MG 2.0 -- --  PHOS -- -- --   Liver Function Tests:  Basename 07/16/12 0720  AST 19  ALT 13  ALKPHOS 57  BILITOT 0.3  PROT 7.2  ALBUMIN 3.9   No results found for this basename: LIPASE:2,AMYLASE:2 in the last 72 hours No results found for this basename: AMMONIA:2 in the last 72 hours CBC:  Basename 07/17/12 0511 07/16/12 0720  WBC 5.2 4.5  NEUTROABS -- 2.4  HGB 10.9* 11.8*  HCT 32.8* 34.8*  MCV 92.9 92.3  PLT 183 176   Cardiac Enzymes:  Basename 07/17/12 0511 07/16/12 1929 07/16/12 1327    CKTOTAL -- -- --  CKMB -- -- --  CKMBINDEX -- -- --  TROPONINI <0.30 <0.30 <0.30   BNP:  Basename 07/16/12 1050  PROBNP 161.8   D-Dimer:  Alvira Philips 07/16/12 1104  DDIMER 2.49*   CBG: No results found for this basename: GLUCAP:6 in the last 72 hours Hemoglobin A1C: No results found for this basename: HGBA1C in the last 72 hours Fasting Lipid Panel:  Basename 07/16/12 1104  CHOL 177  HDL 95  LDLCALC 68  TRIG 70  CHOLHDL 1.9  LDLDIRECT --   Thyroid Function Tests: No results found for this basename: TSH,T4TOTAL,FREET4,T3FREE,THYROIDAB in the last 72 hours Anemia Panel: No results found for this basename: VITAMINB12,FOLATE,FERRITIN,TIBC,IRON,RETICCTPCT in the last 72 hours Coagulation: No results found for this basename: LABPROT:2,INR:2 in the last 72 hours Urine Drug Screen: Drugs of Abuse  No results found for this basename: labopia,  cocainscrnur,  labbenz,  amphetmu,  thcu,  labbarb    Alcohol Level: No results found for this basename: ETH:2 in the last 72 hours Urinalysis: No results found for this basename: COLORURINE:2,APPERANCEUR:2,LABSPEC:2,PHURINE:2,GLUCOSEU:2,HGBUR:2,BILIRUBINUR:2,KETONESUR:2,PROTEINUR:2,UROBILINOGEN:2,NITRITE:2,LEUKOCYTESUR:2 in the last 72 hours Misc. Labs:   Micro: No results found for this or any previous visit (from the past 240 hour(s)).  Studies/Results: Dg Chest Port 1 [address]  07/16/2012  *RADIOLOGY REPORT*  Clinical Data: Pacemaker  PORTABLE CHEST - 1 VIEW  Comparison: 07/14/2010  Findings: Dual lead left subclavian pacemaker device stable.  Mild cardiomegaly.  Increased linear markings at the lung bases are chronic.  Upper lungs clear.  No pneumothorax.  No pleural effusion.  IMPRESSION: Cardiomegaly without CHF.  Stable pacemaker device.   Original Report Authenticated By: Donavan Burnet, M.D.     Medications:  Scheduled:   . aspirin EC  325 mg Oral Daily  . enoxaparin (LOVENOX) injection  40 mg Subcutaneous Q24H  .  isosorbide mononitrate  30 mg Oral Daily  . lisinopril  10 mg Oral Daily  . pantoprazole  40 mg Oral Q1200  . rivastigmine  9.5 mg Transdermal Daily  . simvastatin  20 mg Oral q1800  . sodium chloride  3 mL Intravenous Q12H   Continuous:   . 0.45 % NaCl with KCl 20 mEq / L 75 mL/hr at 07/16/12 2143   NWG:[id number], acetaminophen, ALPRAZolam, HYDROcodone-acetaminophen, nitroGLYCERIN, ondansetron (ZOFRAN) IV, ondansetron  Assessment: Principal Problem:  *Chest pain Active Problems:  HYPERLIPIDEMIA  DEMENTIA  ANXIETY DISORDER, GENERALIZED  HYPERTENSION  GERD  Pacemaker  CAD (coronary artery disease)  Sinoatrial node dysfunction  Prolonged Q-T interval on ECG  Hypokalemia   1. Chest pain. History of coronary artery disease. Myocardial infarction ruled out with negative troponin I's x3. However, given her elevated d-dimer, will assess for pulmonary embolus. We'll also consider GERD as a potential cause of chest pain. We'll continue Protonix.  Prolonged QT interval on EKG. Her EKG early this morning revealed a prolonged QT interval of 716. I do not have calipers to check this closely. EKG also reveals atrial paced rhythm with prolonged AV conduction. We'll order another EKG and check her magnesium level.  History of sinoatrial node dysfunction, status post pacemaker.  Hypertension. Currently stable and controlled.  Hyperlipidemia. Stable on lovastatin. Fasting lipid panel noted and the results are excellent.  Hypokalemia. The magnesium level just came back and it is within normal limits. It is likely that her hypokalemia secondary to hypotonic IV fluids. Next  Anemia. In part, her anemia is dilutional from IV fluids. Her hemoglobin was 11.8 on admission and 10.9 today. Further workup will be deferred to outpatient setting unless the patient needs to stay another day.   Plan: 1. In the setting of elevated d-dimer, we'll order a CT angiogram of the chest to assess for  pulmonary embolus. 2. We'll order another EKG for further evaluation. 3. Supplement potassium chloride. 4. If CT angiogram of the chest is negative, we'll discharge home today with close followup with cardiology.    LOS: 1 day   Candace Cervantes 07/17/2012, 9:46 AM

## 2012-07-17 NOTE — Discharge Summary (Signed)
Physician Discharge Summary  Candace Cervantes MRN: [id number] DOB/AGE: October 20, 1924 78 y.o.  PCP: [name], MD   Admit date: 07/16/2012 Discharge date: 07/17/2012  Discharge Diagnoses:  1. Chest pain. Myocardial infarction ruled out. CT angiogram negative for pulmonary embolus. 2. Coronary artery disease. 3. Sinoatrial node dysfunction, status post pacemaker in the past. 4. Hypertension. 5. Hyperlipidemia. The patient's fasting lipid profile revealed total cholesterol 177, triglycerides of 70, HDL cholesterol of 95, and LDL cholesterol 68. 6. Hypokalemia. 7. Generalized anxiety disorder. 8. Gastroesophageal reflux disease. 9. Mild anemia. Further evaluation will be deferred to primary care.     Medication List     As of 07/17/2012  1:16 PM    TAKE these medications         ALPRAZolam 0.25 MG tablet   Commonly known as: XANAX   Take 1 tablet (0.25 mg total) by mouth 2 (two) times daily as needed for anxiety.      aspirin EC 81 MG tablet   Take 81 mg by mouth every other day.      calcium-vitamin D 500-200 MG-UNIT per tablet   Commonly known as: OSCAL WITH D   Take 1 tablet by mouth 2 (two) times daily.      CELEBREX 200 MG capsule   Generic drug: celecoxib      citalopram 10 MG tablet   Commonly known as: CELEXA   TAKE 1 TABLET BY MOUTH EVERY DAY      EXELON 9.5 mg/24hr   Generic drug: rivastigmine   APPLY 1 PATCH TOPICALLY TO SKIN EVERY DAY      isosorbide mononitrate 30 MG 24 hr tablet   Commonly known as: IMDUR   Take 1 tablet (30 mg total) by mouth daily. Long acting nitroglycerin pill taken once a day for chest pain.      lovastatin 40 MG tablet   Commonly known as: MEVACOR   TAKE 1 TABLET BY MOUTH EVERY NIGHT AT BEDTIME      meclizine 25 MG tablet   Commonly known as: ANTIVERT      multivitamin Liqd   Take 5 mLs by mouth daily.      nitroGLYCERIN 0.4 MG SL tablet   Commonly known as: NITROSTAT   Place 1 tablet (0.4 mg total) under the  tongue every 5 (five) minutes as needed for chest pain. One tablet under the tongue at onset of chest pains, repeat every five minutes as needed      omeprazole 20 MG capsule   Commonly known as: PRILOSEC   TAKE 1 CAPSULE BY MOUTH EVERY DAY      potassium chloride 20 MEQ/15ML (10%) solution   Take 15 mLs (20 mEq total) by mouth daily.      triamterene-hydrochlorothiazide 37.5-25 MG per tablet   Commonly known as: MAXZIDE-25   Take 1.5 tablets by mouth every morning. TAKE 1 1/2 TABLETS BY MOUTH EVERY DAY        Discharge Condition: Improved.  Disposition:  The patient was discharged to home in improved condition. She was instructed to call Dr. Lodema Hong and Dr. Dietrich Pates for followup appointment in one to 2 weeks. This was discussed with her daughter and husband.   Consults: None   Significant Diagnostic Studies: Ct Angio Chest Pe W/cm &/or Wo Cm  07/17/2012  *RADIOLOGY REPORT*  Clinical Data: Chest pain.  Elevated D-dimer.  Evaluate for pulmonary embolism.  CT ANGIOGRAPHY CHEST  Technique:  Multidetector CT imaging of the chest using the standard  protocol during bolus administration of intravenous contrast. Multiplanar reconstructed images including MIPs were obtained and reviewed to evaluate the vascular anatomy.  Contrast: OMNIPAQUE IOHEXOL 350 MG/ML SOLN  Comparison: No priors.  Findings:  Mediastinum: There are no filling defects within the pulmonary arterial tree to suggest underlying pulmonary embolism. Heart size is mildly enlarged. There is no significant pericardial fluid, thickening or pericardial calcification. There is atherosclerosis of the thoracic aorta, the great vessels of the mediastinum and the coronary arteries, including calcified atherosclerotic plaque in the left anterior descending coronary artery. No pathologically enlarged mediastinal or hilar lymph nodes. There is a small hiatal hernia. Left-sided pacemaker device in place with lead tips terminating in the  right atrial appendage and right ventricle.  Lungs/Pleura: Dependent atelectasis in the lower lobes of the lungs bilaterally.  No acute consolidative airspace disease.  No pleural effusions.  No suspicious appearing pulmonary nodules or masses are identified.  Upper Abdomen: Unremarkable.  Musculoskeletal: There are no aggressive appearing lytic or blastic lesions noted in the visualized portions of the skeleton.  IMPRESSION: 1.  No evidence of pulmonary embolism. 2.  No acute findings in the thorax to account for patient's symptoms. 3.  Mild cardiomegaly. 4.  Small hiatal hernia. 5. Atherosclerosis, including left anterior descending coronary artery disease.   Original Report Authenticated By: Florencia Reasons, M.D.    Dg Chest Port 1 View  07/16/2012  *RADIOLOGY REPORT*  Clinical Data: Pacemaker  PORTABLE CHEST - 1 VIEW  Comparison: 07/14/2010  Findings: Dual lead left subclavian pacemaker device stable.  Mild cardiomegaly.  Increased linear markings at the lung bases are chronic.  Upper lungs clear.  No pneumothorax.  No pleural effusion.  IMPRESSION: Cardiomegaly without CHF.  Stable pacemaker device.   Original Report Authenticated By: Donavan Burnet, M.D.      Microbiology: No results found for this or any previous visit (from the past 240 hour(s)).   Labs: Results for orders placed during the hospital encounter of 07/16/12 (from the past 48 hour(s))  CBC WITH DIFFERENTIAL     Status: Abnormal   Collection Time   07/16/12  7:20 AM      Component Value Range Comment   WBC 4.5  4.0 - 10.5 K/uL    RBC 3.77 (*) 3.87 - 5.11 MIL/uL    Hemoglobin 11.8 (*) 12.0 - 15.0 g/dL    HCT 69.6 (*) 29.5 - 46.0 %    MCV 92.3  78.0 - 100.0 fL    MCH 31.3  26.0 - 34.0 pg    MCHC 33.9  30.0 - 36.0 g/dL    RDW 28.4  13.2 - 44.0 %    Platelets 176  150 - 400 K/uL    Neutrophils Relative 53  43 - 77 %    Neutro Abs 2.4  1.7 - 7.7 K/uL    Lymphocytes Relative 40  12 - 46 %    Lymphs Abs 1.8  0.7 - 4.0  K/uL    Monocytes Relative 6  3 - 12 %    Monocytes Absolute 0.3  0.1 - 1.0 K/uL    Eosinophils Relative 1  0 - 5 %    Eosinophils Absolute 0.1  0.0 - 0.7 K/uL    Basophils Relative 0  0 - 1 %    Basophils Absolute 0.0  0.0 - 0.1 K/uL   COMPREHENSIVE METABOLIC PANEL     Status: Abnormal   Collection Time   07/16/12  7:20 AM  Component Value Range Comment   Sodium 138  135 - 145 mEq/L    Potassium 3.6  3.5 - 5.1 mEq/L    Chloride 101  96 - 112 mEq/L    CO2 28  19 - 32 mEq/L    Glucose, Bld 112 (*) 70 - 99 mg/dL    BUN 16  6 - 23 mg/dL    Creatinine, Ser 3.08  0.50 - 1.10 mg/dL    Calcium 65.7  8.4 - 10.5 mg/dL    Total Protein 7.2  6.0 - 8.3 g/dL    Albumin 3.9  3.5 - 5.2 g/dL    AST 19  0 - 37 U/L    ALT 13  0 - 35 U/L    Alkaline Phosphatase 57  39 - 117 U/L    Total Bilirubin 0.3  0.3 - 1.2 mg/dL    GFR calc non Af Amer 46 (*) >90 mL/min    GFR calc Af Amer 53 (*) >90 mL/min   TROPONIN I     Status: Normal   Collection Time   07/16/12  7:20 AM      Component Value Range Comment   Troponin I <0.30  <0.30 ng/mL   PRO B NATRIURETIC PEPTIDE     Status: Normal   Collection Time   07/16/12 10:50 AM      Component Value Range Comment   Pro B Natriuretic peptide (BNP) 161.8  0 - 450 pg/mL   TSH     Status: Normal   Collection Time   07/16/12 10:50 AM      Component Value Range Comment   TSH 4.288  0.350 - 4.500 uIU/mL   HEMOGLOBIN A1C     Status: Abnormal   Collection Time   07/16/12 11:04 AM      Component Value Range Comment   Hemoglobin A1C 6.0 (*) <5.7 %    Mean Plasma Glucose 126 (*) <117 mg/dL   LIPID PANEL     Status: Normal   Collection Time   07/16/12 11:04 AM      Component Value Range Comment   Cholesterol 177  0 - 200 mg/dL    Triglycerides 70  <846 mg/dL    HDL 95  >96 mg/dL    Total CHOL/HDL Ratio 1.9      VLDL 14  0 - 40 mg/dL    LDL Cholesterol 68  0 - 99 mg/dL   D-DIMER, QUANTITATIVE     Status: Abnormal   Collection Time   07/16/12 11:04 AM        Component Value Range Comment   D-Dimer, Quant 2.49 (*) 0.00 - 0.48 ug/mL-FEU   TROPONIN I     Status: Normal   Collection Time   07/16/12  1:27 PM      Component Value Range Comment   Troponin I <0.30  <0.30 ng/mL   TROPONIN I     Status: Normal   Collection Time   07/16/12  7:29 PM      Component Value Range Comment   Troponin I <0.30  <0.30 ng/mL   BASIC METABOLIC PANEL     Status: Abnormal   Collection Time   07/17/12  5:11 AM      Component Value Range Comment   Sodium 136  135 - 145 mEq/L    Potassium 3.4 (*) 3.5 - 5.1 mEq/L    Chloride 102  96 - 112 mEq/L    CO2 26  19 - 32 mEq/L  Glucose, Bld 107 (*) 70 - 99 mg/dL    BUN 18  6 - 23 mg/dL    Creatinine, Ser 1.47  0.50 - 1.10 mg/dL    Calcium 9.6  8.4 - 82.9 mg/dL    GFR calc non Af Amer 44 (*) >90 mL/min    GFR calc Af Amer 52 (*) >90 mL/min   CBC     Status: Abnormal   Collection Time   07/17/12  5:11 AM      Component Value Range Comment   WBC 5.2  4.0 - 10.5 K/uL    RBC 3.53 (*) 3.87 - 5.11 MIL/uL    Hemoglobin 10.9 (*) 12.0 - 15.0 g/dL    HCT 56.2 (*) 13.0 - 46.0 %    MCV 92.9  78.0 - 100.0 fL    MCH 30.9  26.0 - 34.0 pg    MCHC 33.2  30.0 - 36.0 g/dL    RDW 86.5  78.4 - 69.6 %    Platelets 183  150 - 400 K/uL   TROPONIN I     Status: Normal   Collection Time   07/17/12  5:11 AM      Component Value Range Comment   Troponin I <0.30  <0.30 ng/mL   MAGNESIUM     Status: Normal   Collection Time   07/17/12  8:33 AM      Component Value Range Comment   Magnesium 2.0  1.5 - 2.5 mg/dL      HPI : The patient is an 77 year old woman with a history significant for coronary artery disease, hypertension, status post pacemaker, and generalized anxiety, who presented to the emergency department on 07/16/2012 with a chief complaint of chest pain. In the emergency department, she was afebrile and hemodynamically stable, though hypertensive with a blood pressure 182/85. Her troponin I was negative. Her EKG  revealed atrial paced rhythm, prolonged AV conduction, nonspecific ST abnormalities, and a heart rate of 63 beats per minute. Her chest x-ray revealed cardiomegaly. She was admitted for further evaluation and management.  HOSPITAL COURSE: The patient was continued on all of her chronic medications with exception of triamterene/hydrochlorothiazide. This was temporarily withheld and she was being hydrated. Lisinopril was started for treatment of hypertension. Sublingual nitroglycerin was continued as needed. Aspirin was continued. Imdur was started daily for treatment of chest pain in the setting of known coronary artery disease. As needed analgesics were added as well for pain. Proton pump inhibitor therapy was restarted with Protonix. For further evaluation, a number of studies were ordered. Her followup EKG revealed prolonged QT interval, but this was apparently read as a mistake. The subsequent EKG revealed an atrial paced rhythm with prolonged AV conduction and a heart rate of 63 beats per minute. Her troponin I. was negative x4. Her pro BNP was within normal limits at 162. The results of her fasting lipid profile were dictated above. Her potassium chloride fell to 3.4 following IV fluids. She was supplemented with potassium chloride orally. She was continued on long-term potassium chloride treatment. Her magnesium level was within normal limits at 2.0. Her hemoglobin fell to 10.9, owing to the dilutional effects of the IV fluids. Further evaluation of her anemia will be deferred to her primary care physician Dr. Lodema Hong. Her TSH was within normal limits at 4.28. Her hemoglobin A1c was within normal limits at 6.0. Her d-dimer was elevated at 2.49. Because of the elevation in her d-dimer, a CT angiogram of her chest was ordered. It  was negative for pulmonary embolism.  The patient became chest pain-free and remained chest pain-free. The etiology of her pain was not clear. Her blood pressure improved.  Lisinopril was ultimately discontinued in favor of continued treatment with triamterene/HCTZ. She was instructed to take omeprazole daily. She was instructed to take Celebrex only as needed. She was instructed to followup with her cardiologist in one to 2 weeks. She voiced understanding. This was discussed with her husband and daughter.    Discharge Exam: See physical exam dictated earlier on progress note. Blood pressure 119/57, pulse 61, temperature 97.2 F (36.2 C), temperature source Oral, resp. rate 18, height 5\' 11"  (1.803 m), weight 79.8 kg (175 lb 14.8 oz), SpO2 97.00%.         Discharge Orders    Future Appointments: Provider: Department: Dept Phone: Center:   10/24/2012 10:15 AM Kerri Perches, MD [facility] [phone] [facility]     Future Orders Please Complete By Expires   Diet - low sodium heart healthy      Increase activity slowly      Discharge instructions      Comments:   Followup with your primary care doctor Dr. Lodema Hong in your cardiologist Dr. Dietrich Pates in one to 2 weeks for hospital followup.      Follow-up Information    Follow up with [name], MD. Schedule an appointment as soon as possible for a visit in 1 week.   Contact information:   [address] [place] [address] [phone]       Follow up with [place] Bing, MD. Schedule an appointment as soon as possible for a visit in 2 weeks.   Contact information:   [address] [place] [address] [phone]          Total time for discharge: Less than 30 minutes.   Signed: Dannel Rafter 07/17/2012, 1:16 PM

## 2012-07-17 NOTE — Progress Notes (Signed)
Patient with orders to be discharge home. Discharge instructions given, patient verbalized understanding with teach back method. Patient in stable condition upon discharge. Patient left with spouse via private vehicle.

## 2012-07-20 ENCOUNTER — Encounter: Payer: Self-pay | Admitting: Family Medicine

## 2012-07-20 ENCOUNTER — Ambulatory Visit (INDEPENDENT_AMBULATORY_CARE_PROVIDER_SITE_OTHER): Payer: Medicare Other | Admitting: Family Medicine

## 2012-07-20 VITALS — BP 138/78 | HR 82 | Resp 16 | Ht 69.0 in | Wt 178.1 lb

## 2012-07-20 DIAGNOSIS — F068 Other specified mental disorders due to known physiological condition: Secondary | ICD-10-CM

## 2012-07-20 DIAGNOSIS — I10 Essential (primary) hypertension: Secondary | ICD-10-CM

## 2012-07-20 DIAGNOSIS — E785 Hyperlipidemia, unspecified: Secondary | ICD-10-CM

## 2012-07-20 DIAGNOSIS — R079 Chest pain, unspecified: Secondary | ICD-10-CM

## 2012-07-20 DIAGNOSIS — F411 Generalized anxiety disorder: Secondary | ICD-10-CM

## 2012-07-20 DIAGNOSIS — K219 Gastro-esophageal reflux disease without esophagitis: Secondary | ICD-10-CM

## 2012-07-20 MED ORDER — ALPRAZOLAM 0.25 MG PO TABS: ORAL_TABLET | ORAL | Status: DC

## 2012-07-20 NOTE — Progress Notes (Signed)
  Subjective:    Patient ID: Candace Cervantes, female    DOB: 1925-02-07, 78 y.o.   MRN: [id number]  HPI Pt had acute episode of chest pain, nausea, and light headedness and sweating this past Sunday she was hospitalized with this negative for acute mI, needs card f/u however. Aslo 3 weeks ago experienced light headedness and fell in the bathroom  Generally feels stressed and overwhelmed as her spouse's health deteriorates and she feels this is a large bit of her . Also has questions aout her medications that she should be taking   Review of Systems See HPI Denies recent fever or chills. Denies sinus pressure, nasal congestion, ear pain or sore throat. Denies chest congestion, productive cough or wheezing. Denies abdominal pain, nausea, vomiting,diarrhea or constipation.   Denies dysuria, frequency, hesitancy or incontinence.  Denies headaches, seizures, numbness, or tingling.  Denies skin break down or rash.        Objective:   Physical Exam Patient alert and oriented and in no cardiopulmonary distress.  HEENT: No facial asymmetry, EOMI, no sinus tenderness,  oropharynx pink and moist.  Neck supple no adenopathy.  Chest: Clear to auscultation bilaterally.  CVS: S1, S2 no murmurs, no S3.  ABD: Soft non tender. Bowel sounds normal.  Ext: No edema  MS: Adequate though reduced  ROM spine, shoulders, hips and knees.  Skin: Intact, no ulcerations or rash noted.  Psych: Good eye contact, normal affect. Memory mildly impaired , mildly  anxious not  depressed appearing.  CNS: CN 2-12 intact, power, tone and sensation normal throughout.         Assessment & Plan:

## 2012-07-20 NOTE — Patient Instructions (Addendum)
Annual wellness in early December.please call if you need me   Reduce anxiety medication , alprazolam to once daily at bedtime  Take isosorbide mononitrate ER  Once daily for chest pain    We will set an appointment with Dr. Dietrich Pates forhospital f/u before you leave

## 2012-07-22 NOTE — Assessment & Plan Note (Signed)
Continue current med, no significant change noted

## 2012-07-22 NOTE — Assessment & Plan Note (Signed)
Hyperlipidemia:Low fat diet discussed and encouraged.  Controlled, no change in medication   

## 2012-07-22 NOTE — Assessment & Plan Note (Signed)
Controlled, no change in medication  

## 2012-07-22 NOTE — Assessment & Plan Note (Signed)
Recently hospitalized with concerning symptoms, negative work up, has pacemaker but no significant CAD based on current knowledge, neeeds further cardiology eval baed on recent presentation

## 2012-07-22 NOTE — Assessment & Plan Note (Signed)
Reduce dose of xanax

## 2012-08-09 ENCOUNTER — Encounter: Payer: Self-pay | Admitting: Adult Health

## 2012-08-09 ENCOUNTER — Other Ambulatory Visit: Payer: Self-pay | Admitting: Family Medicine

## 2012-08-09 ENCOUNTER — Ambulatory Visit (INDEPENDENT_AMBULATORY_CARE_PROVIDER_SITE_OTHER): Payer: Medicare Other | Admitting: Adult Health

## 2012-08-09 ENCOUNTER — Telehealth: Payer: Self-pay

## 2012-08-09 VITALS — BP 140/76 | HR 66 | Wt 178.0 lb

## 2012-08-09 DIAGNOSIS — I10 Essential (primary) hypertension: Secondary | ICD-10-CM

## 2012-08-09 DIAGNOSIS — Z950 Presence of cardiac pacemaker: Secondary | ICD-10-CM

## 2012-08-09 MED ORDER — ALPRAZOLAM 0.25 MG PO TABS: ORAL_TABLET | ORAL | Status: DC

## 2012-08-09 NOTE — Progress Notes (Signed)
HPI: Candace Cervantes is a very pleasant 78 year old female patient of [name] who he follows for symptomatic bradycardia status post pacemaker implantation (last checked in January 2013), history of hypertension and dyslipidemia. The patient was recently admitted to [facility] on 07/17/2012 for complaints of recurrent chest pain. The patient was ruled out for myocardial infarction, the CT angiogram was negative for pulmonary embolus. The patient was placed on isosorbide mono 30 mg daily and continued on other medication prior to admission. She comes today without recurrent chest discomfort, but does feel woozy and lightheaded at times. She also states that on occasion she is very wobbly. She denies any syncope, or presyncope.  No Known Allergies  Current Outpatient Prescriptions  Medication Sig Dispense Refill  . ALPRAZolam (XANAX) 0.25 MG tablet One tablet once daily at bedtime for anxiety.  Dose reduction effective 07/20/2012  30 tablet  1  . aspirin EC 81 MG tablet Take 81 mg by mouth every other day.      . calcium-vitamin D (OSCAL WITH D 500-200) 500-200 MG-UNIT per tablet Take 1 tablet by mouth 2 (two) times daily.       . celecoxib (CELEBREX) 200 MG capsule       . citalopram (CELEXA) 10 MG tablet TAKE 1 TABLET BY MOUTH EVERY DAY  30 tablet  2  . EXELON 9.5 MG/24HR APPLY 1 PATCH TOPICALLY TO SKIN EVERY DAY  30 each  6  . isosorbide mononitrate (IMDUR) 30 MG 24 hr tablet Take 1 tablet (30 mg total) by mouth daily. Long acting nitroglycerin pill taken once a day for chest pain.  30 tablet  2  . lovastatin (MEVACOR) 40 MG tablet TAKE 1 TABLET BY MOUTH EVERY NIGHT AT BEDTIME  30 tablet  3  . meclizine (ANTIVERT) 25 MG tablet       . Multiple Vitamin (MULTIVITAMIN) LIQD Take 5 mLs by mouth daily.      . nitroGLYCERIN (NITROSTAT) 0.4 MG SL tablet Place 1 tablet (0.4 mg total) under the tongue every 5 (five) minutes as needed for chest pain. One tablet under the tongue at onset of chest  pains, repeat every five minutes as needed  25 tablet  3  . omeprazole (PRILOSEC) 20 MG capsule TAKE 1 CAPSULE BY MOUTH EVERY DAY  30 capsule  3  . potassium chloride 20 MEQ/15ML (10%) solution Take 15 mLs (20 mEq total) by mouth daily.  500 mL  2  . triamterene-hydrochlorothiazide (MAXZIDE-25) 37.5-25 MG per tablet Take 1.5 tablets by mouth every morning. TAKE 1 1/2 TABLETS BY MOUTH EVERY DAY        Past Medical History  Diagnosis Date  . Pacemaker 2002    Permanent Placement  . SSS (sick sinus syndrome)   . Pacemaker 2007    Generator Changed   . Hyperlipidemia   . Hypertension   . CAD (coronary artery disease) 2005    Nonobstructive 40% RCA lesion and normal ejection fraction at cath   . Gastroesophageal reflux disease   . Osteoporosis   . Anxiety disorder   . Osteoarthritis   . Sinoatrial node dysfunction     Past Surgical History  Procedure Date  . Total abdominal hysterectomy 2002  . Cholecystectomy   . Cystectomy     from back of the neck  . Pacemaker insertion 2002  . Pacemaker placement 2007    replacement     YNW:GNFAOZ of systems complete and found to be negative unless listed above  PHYSICAL  EXAM BP 100/48  Pulse 68  Wt 178 lb (80.74 kg)  General: Well developed, well nourished, in no acute distress Head: Eyes PERRLA, No xanthomas.   Normal cephalic and atramatic  Lungs: Clear bilaterally to auscultation and percussion. Heart: HRRR S1 S2, without MRG.  Pulses are 2+ & equal.            No carotid bruit. No JVD.  No abdominal bruits. No femoral bruits. Abdomen: Bowel sounds are positive, abdomen soft and non-tender without masses or                  Hernia's noted. Msk:  Back normal, normal gait. Normal strength and tone for age. Extremities: No clubbing, cyanosis or edema.  DP +1 Neuro: Alert and oriented X 3. Psych:  Good affect, responds appropriately  EKG: Atrial paced rhythm. With LAFB.:  ASSESSMENT AND PLAN

## 2012-08-09 NOTE — Telephone Encounter (Signed)
Called patient with no answer.  No option to leave message.  Rx faxed to [facility] as requested.

## 2012-08-09 NOTE — Telephone Encounter (Signed)
rx printed, pls fax and let her know

## 2012-08-09 NOTE — Patient Instructions (Addendum)
Your physician recommends that you schedule a follow-up appointment in: As planned with Dr Ladona Ridgel

## 2012-08-09 NOTE — Assessment & Plan Note (Signed)
Blood pressure is low today in the office. I had orthostatic blood pressures completed to evaluate for hypotension with use of isosorbide and HCTZ. She is only on triamterene-hydrochlorothiazide 37.5/25 mg daily for blood pressure. Blood pressures were actually better than initial assessment blood pressure of 100/40. Currently her blood pressure is 134/70 lying, sitting blood pressure 132/70, standing blood pressure 140/76. I will make no changes in her medication regimen at this time. She is to followup with Dr. Lodema Hong for ongoing assessment and treatment of other etiology for generalized fatigue. It is noted during hospitalization she was found to be mildly anemic with a hemoglobin of 11.. This will be followed up by her primary care physician.

## 2012-08-09 NOTE — Telephone Encounter (Signed)
Got rx for xanax 0.25 one at bedtime from Elliot Cousin at the hospital and wants a refill on it sent to [facility]

## 2012-08-09 NOTE — Assessment & Plan Note (Signed)
She was last checked by [name] in January of 2013. She will followup with him on a previously scheduled appointment for ongoing assessment of her cardiac needs.

## 2012-08-10 NOTE — Addendum Note (Signed)
Addended by: Reather Laurence A on: 08/10/2012 03:45 PM   Modules accepted: Orders

## 2012-09-22 ENCOUNTER — Other Ambulatory Visit: Payer: Self-pay | Admitting: Family Medicine

## 2012-10-03 NOTE — Progress Notes (Signed)
UR Chart Review Completed  

## 2012-10-21 ENCOUNTER — Other Ambulatory Visit: Payer: Self-pay | Admitting: Family Medicine

## 2012-10-22 LAB — LIPID PANEL
HDL: 75 mg/dL (ref >39)
Total CHOL/HDL Ratio: 2.8 Ratio
VLDL: 14 mg/dL (ref 0–40)

## 2012-10-22 LAB — COMPREHENSIVE METABOLIC PANEL
ALT: 12 U/L (ref 0–35)
AST: 19 U/L (ref 0–37)
Alkaline Phosphatase: 59 U/L (ref 39–117)
BUN: 16 mg/dL (ref 6–23)
Calcium: 10 mg/dL (ref 8.4–10.5)
Chloride: 104 mEq/L (ref 96–112)
Creat: 0.99 mg/dL (ref 0.50–1.10)
Total Bilirubin: 0.4 mg/dL (ref 0.3–1.2)

## 2012-10-22 LAB — HEMOGLOBIN A1C: Hgb A1c MFr Bld: 6.4 % — ABNORMAL HIGH (ref <5.7)

## 2012-10-24 ENCOUNTER — Ambulatory Visit (INDEPENDENT_AMBULATORY_CARE_PROVIDER_SITE_OTHER): Payer: Medicare Other | Admitting: Family Medicine

## 2012-10-24 ENCOUNTER — Encounter: Payer: Self-pay | Admitting: Family Medicine

## 2012-10-24 VITALS — BP 134/68 | HR 64 | Resp 18 | Ht 69.0 in | Wt 179.1 lb

## 2012-10-24 DIAGNOSIS — R7301 Impaired fasting glucose: Secondary | ICD-10-CM

## 2012-10-24 DIAGNOSIS — Z0000 Encounter for general adult medical examination without abnormal findings: Secondary | ICD-10-CM | POA: Insufficient documentation

## 2012-10-24 DIAGNOSIS — I10 Essential (primary) hypertension: Secondary | ICD-10-CM

## 2012-10-24 NOTE — Progress Notes (Signed)
Subjective:    Patient ID: Candace Cervantes, female    DOB: 08-24-1925, 79 y.o.   MRN: [id number]  HPI  Preventive Screening-Counseling & Management   Patient present here today for a Medicare annual wellness visit.   Current Problems (verified)   Medications Prior to Visit Allergies (verified)   PAST HISTORY  Family History  Social History Married twice , initial was 45 yrs , current is 16 years  Mom of 2 adult  Children ihoinvolved n her care. No tobacco or street drug use, no alcohol  Risk Factors  Current exercise habits:  Walking but limited dsue to weather , will start stationary bike  Dietary issues discussed:yes  Cardiac risk factors: Pacemeker inserted  8 years ago  Depression Screen  (Note: if answer to either of the following is "Yes", a more complete depression screening is indicated)   Over the past two weeks, have you felt down, depressed or hopeless? Yes, unable to keep house clean, unable to garden Over the past two weeks, have you felt little interest or pleasure in doing things? No  Have you lost interest or pleasure in daily life? No  Do you often feel hopeless? No  Do you cry easily over simple problems? No   Activities of Daily Living  In your present state of health, do you have any difficulty performing the following activities?  Driving?: No Managing money?: No Feeding yourself?:No Getting from bed to chair?:No Climbing a flight of stairs?:No Preparing food and eating?:No Bathing or showering?:No, difficulty getting out of the bath tub Getting dressed?:No Getting to the toilet?:No Using the toilet?:No Moving around from place to place?: No  Fall Risk Assessment In the past year have you fallen or had a near fall?:No, feels unsteady often most of the time, no vertigo Are you currently taking any medications that make you dizziness?:No   Hearing Difficulties: No Do you often ask people to speak up or repeat themselves?:No Do you experience  ringing or noises in your ears?:No Do you have difficulty understanding soft or whispered voices?:No  Cognitive Testing  Alert? Yes Normal Appearance?Yes  Oriented to person? Yes Place? Yes  Time? Yes  Displays appropriate judgment?Yes  Can read the correct time from a watch face? yes Are you having problems remembering things?at times , but improved with medication  Advanced Directives have been discussed with the patient?Yes    List the Names of Other Physician/Practitioners you currently use: dr Donnella Bi and Dr Susa Simmonds any recent Medical Services you may have received from other than [facility] providers in the past year (date may be approximate).   Assessment:    Annual Wellness Exam   Plan:    During the course of the visit the patient was educated and counseled about appropriate screening and preventive services including:  A healthy diet is rich in fruit, vegetables and whole grains. Poultry fish, nuts and beans are a healthy choice for protein rather then red meat. A low sodium diet and drinking 64 ounces of water daily is generally recommended. Oils and sweet should be limited. Carbohydrates especially for those who are diabetic or overweight, should be limited to 30-45 gram per meal. It is important to eat on a regular schedule, at least 3 times daily. Snacks should be primarily fruits, vegetables or nuts. It is important that you exercise regularly at least 30 minutes 5 times a week. If you develop chest pain, have severe difficulty breathing, or feel very tired, stop exercising immediately and  seek medical attention  Immunization reviewed and updated. Cancer screening reviewed and updated    Patient Instructions (the written plan) was given to the patient.  Medicare Attestation  I have personally reviewed:  The patient's medical and social history  Their use of alcohol, tobacco or illicit drugs  Their current medications and supplements  The patient's functional  ability including ADLs,fall risks, home safety risks, cognitive, and hearing and visual impairment  Diet and physical activities  Evidence for depression or mood disorders  The patient's weight, height, BMI, and visual acuity have been recorded in the chart. I have made referrals, counseling, and provided education to the patient based on review of the above and I have provided the patient with a written personalized care plan for preventive services.     Review of Systems     Objective:   Physical Exam        Assessment & Plan:

## 2012-10-24 NOTE — Assessment & Plan Note (Signed)
Annual wellness exam completed as documented Pt is experiencing some loneliness and loss as she is no longer able to do as she has in the past. She is encouraged to interact more with her children , who are involved in her care, as well as to accept the fact that  With aging limitations occur. nd of life issues need  To be documented and discussed with her family, she is a full code.

## 2012-10-24 NOTE — Patient Instructions (Addendum)
F/u in 4 month  Please cut back on carbs and sweet  Cholesterol is a bit high , blood sugar has increased, you need to be careful so you do not become diabetic.  No need for another pneumonia vacine  Vision screen is normal, call if you want to see eye specialist  Let your children know end of life wishes

## 2012-10-28 ENCOUNTER — Other Ambulatory Visit: Payer: Self-pay

## 2012-10-28 MED ORDER — LOVASTATIN 40 MG PO TABS: ORAL_TABLET | ORAL | Status: DC

## 2012-10-28 MED ORDER — ISOSORBIDE MONONITRATE ER 30 MG PO TB24: 30.0000 mg | ORAL_TABLET | Freq: Every day | ORAL | Status: DC

## 2012-10-28 MED ORDER — CITALOPRAM HYDROBROMIDE 10 MG PO TABS: 10.0000 mg | ORAL_TABLET | Freq: Every day | ORAL | Status: DC

## 2012-10-28 MED ORDER — TRIAMTERENE-HCTZ 37.5-25 MG PO TABS: 1.5000 | ORAL_TABLET | Freq: Every morning | ORAL | Status: DC

## 2012-11-02 ENCOUNTER — Telehealth: Payer: Self-pay | Admitting: Family Medicine

## 2012-11-02 NOTE — Telephone Encounter (Signed)
Needs PA. Waiting on form to be faxed

## 2012-11-04 ENCOUNTER — Telehealth: Payer: Self-pay | Admitting: Family Medicine

## 2012-11-04 ENCOUNTER — Other Ambulatory Visit: Payer: Self-pay | Admitting: Family Medicine

## 2012-11-04 NOTE — Telephone Encounter (Signed)
Called patient and left message for them to return call at the office   

## 2012-11-04 NOTE — Telephone Encounter (Signed)
Please let pt know she has to try/use tablet, aricept for her memory in place of exelon patch due to formulary coverage If that does not work well for her we can attempt to change her back to the patch Script entered historically, only 30 day supplies written if a mail order for 90 days pls change the quantity before faxing

## 2012-11-07 ENCOUNTER — Other Ambulatory Visit: Payer: Self-pay

## 2012-11-07 MED ORDER — DONEPEZIL HCL 10 MG PO TABS: 10.0000 mg | ORAL_TABLET | Freq: Every day | ORAL | Status: DC

## 2012-11-07 NOTE — Telephone Encounter (Signed)
Called pt no answer °

## 2012-11-07 NOTE — Telephone Encounter (Signed)
Patient aware.

## 2012-11-28 ENCOUNTER — Other Ambulatory Visit: Payer: Self-pay | Admitting: Internal Medicine

## 2012-11-28 ENCOUNTER — Ambulatory Visit (INDEPENDENT_AMBULATORY_CARE_PROVIDER_SITE_OTHER): Payer: Medicare Other | Admitting: *Deleted

## 2012-11-28 DIAGNOSIS — I495 Sick sinus syndrome: Secondary | ICD-10-CM

## 2012-11-28 LAB — PACEMAKER DEVICE OBSERVATION
AL IMPEDENCE PM: 397 Ohm
ATRIAL PACING PM: 80
BATTERY VOLTAGE: 2.8 V
RV LEAD IMPEDENCE PM: 474 Ohm

## 2012-11-28 NOTE — Progress Notes (Signed)
PPM check 

## 2012-12-14 ENCOUNTER — Encounter: Payer: Self-pay | Admitting: Internal Medicine

## 2012-12-26 ENCOUNTER — Telehealth: Payer: Self-pay | Admitting: Family Medicine

## 2013-01-02 ENCOUNTER — Other Ambulatory Visit: Payer: Self-pay | Admitting: Family Medicine

## 2013-01-02 ENCOUNTER — Other Ambulatory Visit: Payer: Self-pay

## 2013-01-02 MED ORDER — FENOFIBRATE 145 MG PO TABS: 145.0000 mg | ORAL_TABLET | Freq: Every day | ORAL | Status: DC

## 2013-01-02 NOTE — Telephone Encounter (Signed)
msg left for pt. Med sent

## 2013-01-02 NOTE — Telephone Encounter (Signed)
tricor script is written, pls send in after you spk to her, aloso notify pharmacy of med change

## 2013-01-10 ENCOUNTER — Other Ambulatory Visit: Payer: Self-pay | Admitting: Family Medicine

## 2013-01-16 ENCOUNTER — Other Ambulatory Visit: Payer: Self-pay | Admitting: Family Medicine

## 2013-01-17 ENCOUNTER — Other Ambulatory Visit: Payer: Self-pay

## 2013-01-17 MED ORDER — MECLIZINE HCL 25 MG PO TABS: ORAL_TABLET | ORAL | Status: DC

## 2013-02-17 LAB — BASIC METABOLIC PANEL
BUN: 18 mg/dL (ref 6–23)
CO2: 31 mEq/L (ref 19–32)
Calcium: 10.5 mg/dL (ref 8.4–10.5)
Chloride: 105 mEq/L (ref 96–112)
Creat: 1.17 mg/dL — ABNORMAL HIGH (ref 0.50–1.10)

## 2013-02-17 LAB — HEMOGLOBIN A1C: Mean Plasma Glucose: 117 mg/dL — ABNORMAL HIGH (ref <117)

## 2013-02-21 ENCOUNTER — Encounter: Payer: Self-pay | Admitting: Family Medicine

## 2013-02-21 ENCOUNTER — Ambulatory Visit (INDEPENDENT_AMBULATORY_CARE_PROVIDER_SITE_OTHER): Payer: Medicare Other | Admitting: Family Medicine

## 2013-02-21 VITALS — BP 140/72 | HR 64 | Resp 16 | Ht 69.0 in | Wt 176.0 lb

## 2013-02-21 DIAGNOSIS — F068 Other specified mental disorders due to known physiological condition: Secondary | ICD-10-CM

## 2013-02-21 DIAGNOSIS — I10 Essential (primary) hypertension: Secondary | ICD-10-CM

## 2013-02-21 DIAGNOSIS — F411 Generalized anxiety disorder: Secondary | ICD-10-CM

## 2013-02-21 DIAGNOSIS — E785 Hyperlipidemia, unspecified: Secondary | ICD-10-CM

## 2013-02-21 DIAGNOSIS — R51 Headache: Secondary | ICD-10-CM

## 2013-02-21 DIAGNOSIS — R5381 Other malaise: Secondary | ICD-10-CM

## 2013-02-21 DIAGNOSIS — D179 Benign lipomatous neoplasm, unspecified: Secondary | ICD-10-CM

## 2013-02-21 NOTE — Progress Notes (Signed)
  Subjective:    Patient ID: Candace Cervantes, female    DOB: December 21, 1924, 79 y.o.   MRN: [id number]  HPI The PT is here for follow up and re-evaluation of chronic medical conditions, medication management and review of any available recent lab and radiology data.  Preventive health is updated, specifically  Cancer screening and Immunization.   Questions or concerns regarding consultations or procedures which the PT has had in the interim are  addressed. The PT denies any adverse reactions to current medications since the last visit.  There are no new concerns.  There are no specific complaints , except for intermittent headaches, with no change in frequency otrr severity. Denies localized weakness or numbness, no associated aura      Review of Systems See HPI Denies recent fever or chills. Denies sinus pressure, nasal congestion, ear pain or sore throat. Denies chest congestion, productive cough or wheezing. Denies chest pains, palpitations and leg swelling Denies abdominal pain, nausea, vomiting,diarrhea or constipation.   Denies dysuria, frequency, hesitancy or incontinence. Chronic  joint pain,and limitation in mobility. Denies  seizures, numbness, or tingling. Denies uncontrolled  depression, anxiety or insomnia. Denies skin break down or rash.        Objective:   Physical Exam  Patient alert and oriented and in no cardiopulmonary distress.  HEENT: No facial asymmetry, EOMI, no sinus tenderness,  oropharynx pink and moist.  Neck supple no adenopathy.  Chest: Clear to auscultation bilaterally.  CVS: S1, S2 no murmurs, no S3.  ABD: Soft non tender. Bowel sounds normal.  Ext: No edema  MS: Adequate though reduced  ROM spine, shoulders, hips and knees.  Skin: Intact, no ulcerations or rash noted.  Psych: Good eye contact, normal affect. Memory mildly impaired not anxious or depressed appearing.  CNS: CN 2-12 intact, power, tone and sensation normal  throughout.       Assessment & Plan:

## 2013-02-21 NOTE — Patient Instructions (Addendum)
F/u in 5 month with rectal, call if you need me before.  Blood sugar is almost normal, congrats, keep it up!   No changes in medication .Marland Kitchen Use tylenol one for headache if needed, call back if headaches worsen  Fasting lipid, cmp , HBA1C, tSH and CBC in October before visit.   Follow low fat diet please

## 2013-02-27 NOTE — Assessment & Plan Note (Signed)
Unchanged, continue medication as before 

## 2013-02-27 NOTE — Assessment & Plan Note (Signed)
Intermittent, no change in frequency or severity, no red flag symptoms, normal CNS exam. Pt to use tylenol as needed

## 2013-02-27 NOTE — Assessment & Plan Note (Signed)
Hyperlipidemia:Low fat diet discussed and encouraged.  Updated lab for next visit 

## 2013-02-27 NOTE — Assessment & Plan Note (Signed)
Controlled, no change in medication  

## 2013-02-27 NOTE — Assessment & Plan Note (Signed)
Controlled, no change in medication DASH diet and commitment to daily physical activity for a minimum of 30 minutes discussed and encouraged, as a part of hypertension management. The importance of attaining a healthy weight is also discussed.  

## 2013-03-02 ENCOUNTER — Other Ambulatory Visit: Payer: Self-pay | Admitting: Family Medicine

## 2013-03-28 ENCOUNTER — Other Ambulatory Visit: Payer: Self-pay | Admitting: Family Medicine

## 2013-03-28 DIAGNOSIS — Z139 Encounter for screening, unspecified: Secondary | ICD-10-CM

## 2013-04-10 ENCOUNTER — Other Ambulatory Visit: Payer: Self-pay | Admitting: Family Medicine

## 2013-04-17 ENCOUNTER — Other Ambulatory Visit: Payer: Self-pay | Admitting: Family Medicine

## 2013-04-17 ENCOUNTER — Ambulatory Visit (HOSPITAL_COMMUNITY)
Admission: RE | Admit: 2013-04-17 | Discharge: 2013-04-17 | Disposition: A | Discharge status: Home / Self Care | Payer: Medicare Other | Source: Ambulatory Visit | Attending: Family Medicine | Admitting: Family Medicine

## 2013-04-17 DIAGNOSIS — Z1231 Encounter for screening mammogram for malignant neoplasm of breast: Secondary | ICD-10-CM | POA: Insufficient documentation

## 2013-04-17 DIAGNOSIS — Z139 Encounter for screening, unspecified: Secondary | ICD-10-CM

## 2013-05-01 ENCOUNTER — Other Ambulatory Visit: Payer: Self-pay | Admitting: Family Medicine

## 2013-05-16 ENCOUNTER — Ambulatory Visit (INDEPENDENT_AMBULATORY_CARE_PROVIDER_SITE_OTHER): Payer: Medicare Other | Admitting: Internal Medicine

## 2013-05-16 VITALS — BP 128/75 | HR 64 | Ht 69.0 in | Wt 171.0 lb

## 2013-05-16 DIAGNOSIS — I4891 Unspecified atrial fibrillation: Secondary | ICD-10-CM

## 2013-05-16 DIAGNOSIS — I495 Sick sinus syndrome: Secondary | ICD-10-CM

## 2013-05-16 DIAGNOSIS — I10 Essential (primary) hypertension: Secondary | ICD-10-CM

## 2013-05-16 DIAGNOSIS — Z950 Presence of cardiac pacemaker: Secondary | ICD-10-CM

## 2013-05-16 LAB — PACEMAKER DEVICE OBSERVATION
AL AMPLITUDE: 1.3 mv
AL THRESHOLD: 0.5 V
BAMS-0001: 180 {beats}/min
DEVICE MODEL PM: 1794510
RV LEAD AMPLITUDE: 6.4 mv
RV LEAD IMPEDENCE PM: 484 Ohm
RV LEAD THRESHOLD: 1 V

## 2013-05-16 NOTE — Assessment & Plan Note (Signed)
Her PPM is working normally. Will recheck in several months. 

## 2013-05-16 NOTE — Patient Instructions (Signed)
Your physician recommends that you schedule a follow-up appointment in: 6 months with Candace Cervantes for a device check  Your physician wants you to follow-up in: 1 year with [name].  You will receive a reminder letter in the mail two months in advance. If you don't receive a letter, please call our office to schedule the follow-up appointment.

## 2013-05-16 NOTE — Progress Notes (Signed)
HPI Candace Cervantes returns today for followup. She is a pleasant 79 yo woman with a h/o symptomatic bradycardia, s/p PPM, HTN, and CAD. In the interim, she has been stable. She admits to occaisional falls. No syncope or chest pain. No sob. No Known Allergies   Current Outpatient Prescriptions  Medication Sig Dispense Refill  . ALPRAZolam (XANAX) 0.25 MG tablet TAKE 1 TABLET BY MOUTH AT BEDTIME AS NEEDED FOR ANXIETY  30 tablet  1  . aspirin EC 81 MG tablet Take 81 mg by mouth every other day.      . calcium-vitamin D (OSCAL WITH D 500-200) 500-200 MG-UNIT per tablet Take 1 tablet by mouth 2 (two) times daily.       . celecoxib (CELEBREX) 200 MG capsule       . citalopram (CELEXA) 10 MG tablet TAKE 1 TABLET BY MOUTH EVERY DAY  30 tablet  2  . fenofibrate (TRICOR) 145 MG tablet Take 1 tablet (145 mg total) by mouth daily.  30 tablet  5  . meclizine (ANTIVERT) 25 MG tablet TAKE 1 TABLET BY MOUTH TWICE DAILY AS NEEDED  40 tablet  0  . Multiple Vitamin (MULTIVITAMIN) LIQD Take 5 mLs by mouth daily.      . nitroGLYCERIN (NITROSTAT) 0.4 MG SL tablet Place 1 tablet (0.4 mg total) under the tongue every 5 (five) minutes as needed for chest pain. One tablet under the tongue at onset of chest pains, repeat every five minutes as needed  25 tablet  3  . omeprazole (PRILOSEC) 20 MG capsule TAKE 1 CAPSULE BY MOUTH EVERY DAY  30 capsule  5  . potassium chloride 20 MEQ/15ML (10%) solution Take 15 mLs (20 mEq total) by mouth daily.  500 mL  2  . triamterene-hydrochlorothiazide (MAXZIDE-25) 37.5-25 MG per tablet TAKE 1 AND 1/2 TABLET BY MOUTH EVERY MORNING  45 tablet  2  . donepezil (ARICEPT) 10 MG tablet Take 1 tablet (10 mg total) by mouth at bedtime.  30 tablet  5  . isosorbide mononitrate (IMDUR) 30 MG 24 hr tablet TAKE 1 TABLET BY MOUTH EVERY DAY  30 tablet  2   No current facility-administered medications for this visit.     Past Medical History  Diagnosis Date  . Pacemaker 2002    Permanent Placement  .  SSS (sick sinus syndrome)   . Pacemaker 2007    Generator Changed   . Hyperlipidemia   . Hypertension   . CAD (coronary artery disease) 2005    Nonobstructive 40% RCA lesion and normal ejection fraction at cath   . Gastroesophageal reflux disease   . Osteoporosis   . Anxiety disorder   . Osteoarthritis   . Sinoatrial node dysfunction     ROS:   All systems reviewed and negative except as noted in the HPI.   Past Surgical History  Procedure Laterality Date  . Total abdominal hysterectomy  2002  . Cholecystectomy    . Cystectomy      from back of the neck  . Pacemaker insertion  2002  . Pacemaker placement  2007    replacement      Family History  Problem Relation Age of Onset  . Lung disease Sister   . Cancer Brother     prostate  . Heart disease Mother      History   Social History  . Marital Status: Married    Spouse Name: N/A    Number of Children: 2  . Years of Education:  N/A   Occupational History  . Not on file.   Social History Main Topics  . Smoking status: Never Smoker   . Smokeless tobacco: Not on file  . Alcohol Use: No  . Drug Use: No  . Sexually Active: Not on file   Other Topics Concern  . Not on file   Social History Narrative  . No narrative on file     BP 128/75  Pulse 64  Ht 5\' 9"  (1.753 m)  Wt 171 lb (77.565 kg)  BMI 25.24 kg/m2  Physical Exam:  Well appearing 79 yo woman, NAD HEENT: Unremarkable Neck:  7 cm JVD, no thyromegally Back:  No CVA tenderness Lungs:  Clear with no wheezes, rales, or rhonchi HEART:  Regular rate rhythm, no murmurs, no rubs, no clicks Abd:  soft, positive bowel sounds, no organomegally, no rebound, no guarding Ext:  2 plus pulses, no edema, no cyanosis, no clubbing Skin:  No rashes no nodules Neuro:  CN II through XII intact, motor grossly intact   DEVICE  Normal device function.  See PaceArt for details.   Assess/Plan:

## 2013-05-16 NOTE — Assessment & Plan Note (Signed)
She has had a few episodes of atrial fibrillation for which she is asymptomatic. She is not a candidate for systemic anti-coagulation. Will follow.

## 2013-05-16 NOTE — Assessment & Plan Note (Signed)
Her blood pressure is well controlled. Continue current meds and maintain a low sodium diet. 

## 2013-05-19 ENCOUNTER — Encounter: Payer: Medicare Other | Admitting: Internal Medicine

## 2013-05-19 ENCOUNTER — Encounter: Payer: Self-pay | Admitting: Internal Medicine

## 2013-06-20 ENCOUNTER — Ambulatory Visit (INDEPENDENT_AMBULATORY_CARE_PROVIDER_SITE_OTHER): Payer: Medicare Other

## 2013-06-20 DIAGNOSIS — Z23 Encounter for immunization: Secondary | ICD-10-CM

## 2013-07-06 ENCOUNTER — Other Ambulatory Visit: Payer: Self-pay | Admitting: Family Medicine

## 2013-07-21 ENCOUNTER — Other Ambulatory Visit: Payer: Self-pay | Admitting: Family Medicine

## 2013-07-22 LAB — COMPREHENSIVE METABOLIC PANEL
ALT: 13 U/L (ref 0–35)
AST: 22 U/L (ref 0–37)
Albumin: 4.2 g/dL (ref 3.5–5.2)
BUN: 15 mg/dL (ref 6–23)
CO2: 31 mEq/L (ref 19–32)
Calcium: 10.1 mg/dL (ref 8.4–10.5)
Chloride: 101 mEq/L (ref 96–112)
Creat: 1.02 mg/dL (ref 0.50–1.10)
Potassium: 4.1 mEq/L (ref 3.5–5.3)
Total Protein: 6.9 g/dL (ref 6.0–8.3)

## 2013-07-22 LAB — CBC
HCT: 33.2 % — ABNORMAL LOW (ref 36.0–46.0)
MCV: 88.5 fL (ref 78.0–100.0)
Platelets: 232 10*3/uL (ref 150–400)
RBC: 3.75 MIL/uL — ABNORMAL LOW (ref 3.87–5.11)
WBC: 4.2 10*3/uL (ref 4.0–10.5)

## 2013-07-22 LAB — TSH: TSH: 2.595 u[IU]/mL (ref 0.350–4.500)

## 2013-07-22 LAB — HEMOGLOBIN A1C
Hgb A1c MFr Bld: 6.1 % — ABNORMAL HIGH (ref <5.7)
Mean Plasma Glucose: 128 mg/dL — ABNORMAL HIGH (ref <117)

## 2013-07-26 ENCOUNTER — Ambulatory Visit (INDEPENDENT_AMBULATORY_CARE_PROVIDER_SITE_OTHER): Payer: Medicare Other | Admitting: Family Medicine

## 2013-07-26 ENCOUNTER — Encounter: Payer: Self-pay | Admitting: Family Medicine

## 2013-07-26 ENCOUNTER — Encounter (INDEPENDENT_AMBULATORY_CARE_PROVIDER_SITE_OTHER): Payer: Self-pay

## 2013-07-26 VITALS — BP 114/72 | HR 69 | Resp 16 | Ht 69.0 in | Wt 167.0 lb

## 2013-07-26 DIAGNOSIS — F411 Generalized anxiety disorder: Secondary | ICD-10-CM

## 2013-07-26 DIAGNOSIS — E785 Hyperlipidemia, unspecified: Secondary | ICD-10-CM

## 2013-07-26 DIAGNOSIS — Z139 Encounter for screening, unspecified: Secondary | ICD-10-CM

## 2013-07-26 DIAGNOSIS — F068 Other specified mental disorders due to known physiological condition: Secondary | ICD-10-CM

## 2013-07-26 DIAGNOSIS — I10 Essential (primary) hypertension: Secondary | ICD-10-CM

## 2013-07-26 DIAGNOSIS — R1310 Dysphagia, unspecified: Secondary | ICD-10-CM

## 2013-07-26 DIAGNOSIS — M1990 Unspecified osteoarthritis, unspecified site: Secondary | ICD-10-CM

## 2013-07-26 MED ORDER — POTASSIUM CHLORIDE 20 MEQ/15ML (10%) PO LIQD: 20.0000 meq | Freq: Every day | ORAL | Status: DC

## 2013-07-26 MED ORDER — OMEPRAZOLE 20 MG PO CPDR: DELAYED_RELEASE_CAPSULE | ORAL | Status: DC

## 2013-07-26 MED ORDER — TRIAMTERENE-HCTZ 37.5-25 MG PO TABS: ORAL_TABLET | ORAL | Status: DC

## 2013-07-26 MED ORDER — ISOSORBIDE MONONITRATE ER 30 MG PO TB24: ORAL_TABLET | ORAL | Status: DC

## 2013-07-26 MED ORDER — ALPRAZOLAM 0.25 MG PO TABS: ORAL_TABLET | ORAL | Status: DC
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
## 2013-07-26 NOTE — Assessment & Plan Note (Signed)
Worsening in dysphagia in past  3 month, refer back to Dr Darrick Penna for re eval, she has seen her in the past. During visit pt alluded to Dr Karilyn Cota, however dr Darrick Penna has seen her in the past for this

## 2013-07-26 NOTE — Assessment & Plan Note (Signed)
Updated lab needed Hyperlipidemia:Low fat diet discussed and encouraged.   
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
## 2013-07-26 NOTE — Assessment & Plan Note (Signed)
Controlled, no change in medication  

## 2013-07-26 NOTE — Assessment & Plan Note (Signed)
Needs objective re assesment next visit

## 2013-07-26 NOTE — Assessment & Plan Note (Signed)
Continue celebrex.  

## 2013-07-26 NOTE — Patient Instructions (Addendum)
F/u in 3 month with rectal, call if you need me before  You are referred to Dr Darrick Penna For difficulty swallowing.  No changes in medication   Fasting lipid, cmp, TSH and vit D in 3 month

## 2013-07-27 ENCOUNTER — Telehealth: Payer: Self-pay | Admitting: Family Medicine

## 2013-07-27 NOTE — Telephone Encounter (Signed)
We already have her scheduled for an office visit on Nov 26, do we need to cancel?

## 2013-07-28 LAB — FERRITIN: Ferritin: 110 ng/mL (ref 10–291)

## 2013-07-31 NOTE — Telephone Encounter (Signed)
pls cancel appt for pt, she has seen Dr Darrick Penna in the past, and I was unaware, till I reviewed the chart further, so I am referring her back to Dr Darrick Penna for follow up, sorry about that

## 2013-08-01 NOTE — Telephone Encounter (Signed)
appt has been canceled

## 2013-08-14 ENCOUNTER — Encounter: Payer: Self-pay | Admitting: Gastroenterology

## 2013-08-15 ENCOUNTER — Other Ambulatory Visit: Payer: Self-pay | Admitting: Family Medicine

## 2013-08-15 ENCOUNTER — Telehealth: Payer: Self-pay | Admitting: Family Medicine

## 2013-08-15 DIAGNOSIS — R1310 Dysphagia, unspecified: Secondary | ICD-10-CM

## 2013-08-15 DIAGNOSIS — K219 Gastro-esophageal reflux disease without esophagitis: Secondary | ICD-10-CM

## 2013-08-15 NOTE — Telephone Encounter (Signed)
pls refer to Dr Karilyn Cota referral entered

## 2013-08-28 ENCOUNTER — Other Ambulatory Visit: Payer: Self-pay | Admitting: Family Medicine

## 2013-08-30 ENCOUNTER — Ambulatory Visit (INDEPENDENT_AMBULATORY_CARE_PROVIDER_SITE_OTHER): Payer: Medicare Other | Admitting: Internal Medicine

## 2013-09-05 ENCOUNTER — Ambulatory Visit: Payer: Medicare Other | Admitting: Gastroenterology

## 2013-09-15 ENCOUNTER — Encounter: Payer: Self-pay | Admitting: Gastroenterology

## 2013-09-15 ENCOUNTER — Ambulatory Visit (INDEPENDENT_AMBULATORY_CARE_PROVIDER_SITE_OTHER): Payer: Medicare Other | Admitting: Gastroenterology

## 2013-09-15 VITALS — BP 140/75 | HR 74 | Temp 97.1°F | Ht 69.0 in | Wt 168.4 lb

## 2013-09-15 DIAGNOSIS — R1314 Dysphagia, pharyngoesophageal phase: Secondary | ICD-10-CM

## 2013-09-15 DIAGNOSIS — R1319 Other dysphagia: Secondary | ICD-10-CM | POA: Insufficient documentation

## 2013-09-15 DIAGNOSIS — K225 Diverticulum of esophagus, acquired: Secondary | ICD-10-CM

## 2013-09-15 DIAGNOSIS — D638 Anemia in other chronic diseases classified elsewhere: Secondary | ICD-10-CM

## 2013-09-15 DIAGNOSIS — R1310 Dysphagia, unspecified: Secondary | ICD-10-CM

## 2013-09-15 NOTE — Progress Notes (Signed)
Primary Care Physician:  [name], MD  Primary Gastroenterologist:  Jonette Eva, MD   Chief Complaint  Patient presents with  . Dysphagia    HPI:  Candace Cervantes is a 79 y.o. female here with concerns her swallowing is getting worse. She has h/o small Zenker's diverticulum discovered in 2008. Seen by Dr. Lazarus Salines (ENT) at that time but it was determined she did not need surgery due to insignificant symptoms. She is concerned that she still has difficulty swallowing large pills. Typically coats with margarine and then they go right down. Has always had difficulty swallowing large pills since she was young. No problems swallowing meat or bread. No problems with with liquids. No heartburn. No abdominal pain. No constipation, diarrhea. No melena, brbpr. Patient does not want to have a TCS. Has lost 5 pounds in the last few months. Relates to stress with husband with Alzheimer's. No N/V. Appetite ok.   Current Outpatient Prescriptions  Medication Sig Dispense Refill  . ALPRAZolam (XANAX) 0.25 MG tablet TAKE 1 TABLET BY MOUTH AT BEDTIME AS NEEDED FOR ANXIETY  30 tablet  2  . aspirin EC 81 MG tablet Take 81 mg by mouth every other day.      . calcium-vitamin D (OSCAL WITH D 500-200) 500-200 MG-UNIT per tablet Take 1 tablet by mouth 2 (two) times daily.       . celecoxib (CELEBREX) 200 MG capsule       . citalopram (CELEXA) 10 MG tablet TAKE 1 TABLET BY MOUTH EVERY DAY  30 tablet  2  . fenofibrate (TRICOR) 145 MG tablet Take 1 tablet (145 mg total) by mouth daily.  30 tablet  5  . isosorbide mononitrate (IMDUR) 30 MG 24 hr tablet TAKE 1 TABLET BY MOUTH EVERY DAY  30 tablet  4  . meclizine (ANTIVERT) 25 MG tablet TAKE 1 TABLET BY MOUTH TWICE DAILY AS NEEDED  40 tablet  0  . Multiple Vitamin (MULTIVITAMIN) LIQD Take 5 mLs by mouth daily.      . nitroGLYCERIN (NITROSTAT) 0.4 MG SL tablet Place 1 tablet (0.4 mg total) under the tongue every 5 (five) minutes as needed for chest pain. One  tablet under the tongue at onset of chest pains, repeat every five minutes as needed  25 tablet  3  . omeprazole (PRILOSEC) 20 MG capsule TAKE 1 CAPSULE BY MOUTH EVERY DAY  30 capsule  4  . potassium chloride 20 MEQ/15ML (10%) solution Take 15 mLs (20 mEq total) by mouth daily.  500 mL  4  . triamterene-hydrochlorothiazide (MAXZIDE-25) 37.5-25 MG per tablet TAKE 1 AND 1/2 TABLET BY MOUTH EVERY MORNING  45 tablet  4  . donepezil (ARICEPT) 10 MG tablet Take 1 tablet (10 mg total) by mouth at bedtime.  30 tablet  5   No current facility-administered medications for this visit.    Allergies as of 09/15/2013  . (No Known Allergies)    Past Medical History  Diagnosis Date  . Pacemaker 2002    Permanent Placement  . SSS (sick sinus syndrome)   . Pacemaker 2007    Generator Changed   . Hyperlipidemia   . Hypertension   . CAD (coronary artery disease) 2005    Nonobstructive 40% RCA lesion and normal ejection fraction at cath   . Gastroesophageal reflux disease   . Osteoporosis   . Anxiety disorder   . Osteoarthritis   . Sinoatrial node dysfunction     Past Surgical History  Procedure Laterality Date  . Total abdominal hysterectomy  2002  . Cholecystectomy    . Cystectomy      from back of the neck  . Pacemaker insertion  2002  . Pacemaker placement  2007    replacement     Family History  Problem Relation Age of Onset  . Lung disease Sister   . Cancer Brother     prostate  . Heart disease Mother     History   Social History  . Marital Status: Married    Spouse Name: N/A    Number of Children: 2  . Years of Education: N/A   Occupational History  . Not on file.   Social History Main Topics  . Smoking status: Never Smoker   . Smokeless tobacco: Not on file  . Alcohol Use: No  . Drug Use: No  . Sexual Activity: Not on file   Other Topics Concern  . Not on file   Social History Narrative  . No narrative on file      ROS:  General: Negative for anorexia,  weight loss, fever, chills, fatigue, weakness. Eyes: Negative for vision changes.  ENT: Negative for hoarseness, difficulty swallowing , nasal congestion. CV: Negative for chest pain, angina, palpitations, dyspnea on exertion, peripheral edema.  Respiratory: Negative for dyspnea at rest, dyspnea on exertion, cough, sputum, wheezing.  GI: See history of present illness. GU:  Negative for dysuria, hematuria, urinary incontinence, urinary frequency, nocturnal urination.  MS: Negative for joint pain, low back pain.  Derm: Negative for rash or itching.  Neuro: Negative for weakness, abnormal sensation, seizure, frequent headaches, memory loss, confusion.  Psych: Negative for anxiety, depression, suicidal ideation, hallucinations.  Endo: Negative for unusual weight change.  Heme: Negative for bruising or bleeding. Allergy: Negative for rash or hives.    Physical Examination:  BP 140/75  Pulse 74  Temp(Src) 97.1 F (36.2 C) (Oral)  Ht 5\' 9"  (1.753 m)  Wt 168 lb 6.4 oz (76.386 kg)  BMI 24.86 kg/m2   General: Well-nourished, well-developed in no acute distress.  Head: Normocephalic, atraumatic.   Eyes: Conjunctiva pink, no icterus. Mouth: Oropharyngeal mucosa moist and pink , no lesions erythema or exudate. Neck: Supple without thyromegaly, masses, or lymphadenopathy.  Lungs: Clear to auscultation bilaterally.  Heart: Regular rate and rhythm, no murmurs rubs or gallops.  Abdomen: Bowel sounds are normal, nontender, nondistended, no hepatosplenomegaly or masses, no abdominal bruits or    hernia , no rebound or guarding.   Rectal: not perfomred Extremities: No lower extremity edema. No clubbing or deformities.  Neuro: Alert and oriented x 4 , grossly normal neurologically.  Skin: Warm and dry, no rash or jaundice.   Psych: Alert and cooperative, normal mood and affect.  Labs: Lab Results  Component Value Date   WBC 4.2 07/21/2013   HGB 11.3* 07/21/2013   HCT 33.2* 07/21/2013   MCV  88.5 07/21/2013   PLT 232 07/21/2013   Component     Latest Ref Rng 07/14/2010 07/15/2010 02/24/2011 03/17/2012             Hemoglobin     12.0 - 15.0 g/dL 46.9 (L) 62.9 (L) 52.8 12.6  HCT     36.0 - 46.0 % 34.6 (L) 33.7 (L) 37.0 37.2  MCV     78.0 - 100.0 fL 93.6 92.6 93.2 90.5   Component     Latest Ref Rng 07/16/2012 07/17/2012 07/21/2013  Hemoglobin     12.0 - 15.0 g/dL 16.1 (L) 09.6 (L) 04.5 (L)  HCT     36.0 - 46.0 % 34.8 (L) 32.8 (L) 33.2 (L)  MCV     78.0 - 100.0 fL 92.3 92.9 88.5   Lab Results  Component Value Date   IRON 54 07/21/2013   FERRITIN 110 07/21/2013   Lab Results  Component Value Date   CREATININE 1.02 07/21/2013   BUN 15 07/21/2013   NA 138 07/21/2013   K 4.1 07/21/2013   CL 101 07/21/2013   CO2 31 07/21/2013   No results found for this basename: VITAMINB12   No results found for this basename: FOLATE   Lab Results  Component Value Date   ALT 13 07/21/2013   AST 22 07/21/2013   ALKPHOS 44 07/21/2013   BILITOT 0.3 07/21/2013   Lab Results  Component Value Date   TSH 2.595 07/21/2013     Imaging Studies: No results found.

## 2013-09-15 NOTE — Assessment & Plan Note (Signed)
Chronic anemia with normal MCV and anemia panel c/w anemia of chronic disease. Patient declines colonoscopy. Offer ifobt.

## 2013-09-15 NOTE — Progress Notes (Signed)
Dr. Darrick Penna, ?[name] vs EGD? Doris, please ask patient to collect ifobt for anemia.

## 2013-09-15 NOTE — Assessment & Plan Note (Signed)
Mild dysphagia to predominantly pills only. Denies difficulty with breads/meats. 5-7 pound weight loss likely unrelated, likely more stress related. She has had several dilations in the past with good clinical response. Will discuss further with Dr. Darrick Penna. ?EGD versus [name].

## 2013-09-15 NOTE — Patient Instructions (Signed)
1. I will discuss next step with Dr. Darrick Penna. She may prefer you having esophagus xray prior to an upper endoscopy.

## 2013-09-18 NOTE — Progress Notes (Signed)
cc'd to pcp 

## 2013-09-18 NOTE — Progress Notes (Signed)
Called and informed pt. She asked me to mail it to her and she will try to bring to the office later this week.

## 2013-09-22 NOTE — Progress Notes (Signed)
REVIEWED. DYSPHAGIA MOST LIKELY DUE TO ZENKER'S. [name] 2010 REVEALED A 13 MM BARIUM TABLET LODGED IN ORIFICE. [name] TO RE-ASSESS ESOPHAGUS.

## 2013-10-02 NOTE — Progress Notes (Signed)
Please schedule [name].

## 2013-10-03 ENCOUNTER — Other Ambulatory Visit: Payer: Self-pay | Admitting: Gastroenterology

## 2013-10-03 DIAGNOSIS — R1319 Other dysphagia: Secondary | ICD-10-CM

## 2013-10-03 NOTE — Progress Notes (Signed)
[name] Is scheduled for Friday Jan 2nd at 8:30 and Candace Cervantes is aware

## 2013-10-03 NOTE — Progress Notes (Signed)
Pt is aware that Soledad Gerlach will schedule [name]. She said she has not done the iFOBT yet, but will try to get it soon.

## 2013-10-06 ENCOUNTER — Ambulatory Visit (HOSPITAL_COMMUNITY)
Admission: RE | Admit: 2013-10-06 | Discharge: 2013-10-06 | Disposition: A | Discharge status: Home / Self Care | Payer: Medicare Other | Source: Ambulatory Visit | Attending: Gastroenterology | Admitting: Gastroenterology

## 2013-10-06 DIAGNOSIS — K222 Esophageal obstruction: Secondary | ICD-10-CM | POA: Insufficient documentation

## 2013-10-06 DIAGNOSIS — K449 Diaphragmatic hernia without obstruction or gangrene: Secondary | ICD-10-CM | POA: Insufficient documentation

## 2013-10-06 DIAGNOSIS — R1310 Dysphagia, unspecified: Secondary | ICD-10-CM | POA: Insufficient documentation

## 2013-10-06 DIAGNOSIS — R1319 Other dysphagia: Secondary | ICD-10-CM

## 2013-10-12 NOTE — Progress Notes (Signed)
REVIEWED [name]. EGD/?DILATION WITH PEDS GASTROSCOPE ASAP.

## 2013-10-12 NOTE — Progress Notes (Signed)
Please see [facility] recommendations. Patient needs EGD/possible dilation with PEDS gastroscope as soon as possible. See result note from [name].

## 2013-10-13 ENCOUNTER — Other Ambulatory Visit: Payer: Self-pay | Admitting: Gastroenterology

## 2013-10-13 ENCOUNTER — Encounter (HOSPITAL_COMMUNITY): Payer: Self-pay | Admitting: Pharmacy Technician

## 2013-10-13 DIAGNOSIS — R1319 Other dysphagia: Secondary | ICD-10-CM

## 2013-10-13 NOTE — Progress Notes (Signed)
EGD W/POSS ED Is scheduled w/[facility] Tuesday Jan 13th at 10:45 and Mrs. Neu is aware I have mailed her instructions

## 2013-10-13 NOTE — Progress Notes (Signed)
Quick Note:  Routing to [employer]. ______

## 2013-10-17 ENCOUNTER — Other Ambulatory Visit: Payer: Self-pay | Admitting: Gastroenterology

## 2013-10-17 ENCOUNTER — Encounter (HOSPITAL_COMMUNITY): Payer: Self-pay | Admitting: *Deleted

## 2013-10-17 ENCOUNTER — Ambulatory Visit (HOSPITAL_COMMUNITY)
Admission: RE | Admit: 2013-10-17 | Discharge: 2013-10-17 | Disposition: A | Discharge status: Home / Self Care | Payer: Medicare Other | Source: Ambulatory Visit | Attending: Gastroenterology | Admitting: Gastroenterology

## 2013-10-17 ENCOUNTER — Encounter (HOSPITAL_COMMUNITY)
Admission: RE | Disposition: A | Discharge status: Home / Self Care | Payer: Self-pay | Source: Ambulatory Visit | Attending: Gastroenterology

## 2013-10-17 DIAGNOSIS — R1310 Dysphagia, unspecified: Secondary | ICD-10-CM | POA: Insufficient documentation

## 2013-10-17 DIAGNOSIS — K2990 Gastroduodenitis, unspecified, without bleeding: Secondary | ICD-10-CM

## 2013-10-17 DIAGNOSIS — K319 Disease of stomach and duodenum, unspecified: Secondary | ICD-10-CM

## 2013-10-17 DIAGNOSIS — D131 Benign neoplasm of stomach: Secondary | ICD-10-CM | POA: Insufficient documentation

## 2013-10-17 DIAGNOSIS — K222 Esophageal obstruction: Secondary | ICD-10-CM

## 2013-10-17 DIAGNOSIS — I10 Essential (primary) hypertension: Secondary | ICD-10-CM | POA: Insufficient documentation

## 2013-10-17 DIAGNOSIS — K225 Diverticulum of esophagus, acquired: Secondary | ICD-10-CM

## 2013-10-17 DIAGNOSIS — R1319 Other dysphagia: Secondary | ICD-10-CM

## 2013-10-17 DIAGNOSIS — K2970 Gastritis, unspecified, without bleeding: Secondary | ICD-10-CM | POA: Insufficient documentation

## 2013-10-17 DIAGNOSIS — K2940 Chronic atrophic gastritis without bleeding: Secondary | ICD-10-CM | POA: Insufficient documentation

## 2013-10-17 HISTORY — PX: MALONEY DILATION: SHX5535

## 2013-10-17 HISTORY — PX: ESOPHAGOGASTRODUODENOSCOPY: SHX5428

## 2013-10-17 HISTORY — PX: SAVORY DILATION: SHX5439

## 2013-10-17 SURGERY — EGD (ESOPHAGOGASTRODUODENOSCOPY)
Anesthesia: Moderate Sedation

## 2013-10-17 MED ORDER — MIDAZOLAM HCL 5 MG/5ML IJ SOLN
INTRAMUSCULAR | Status: AC
  Filled 2013-10-17: qty 10

## 2013-10-17 MED ORDER — MIDAZOLAM HCL 5 MG/5ML IJ SOLN
INTRAMUSCULAR | Status: DC | PRN
  Administered 2013-10-17: 2 mg via INTRAVENOUS

## 2013-10-17 MED ORDER — SODIUM CHLORIDE 0.9 % IV SOLN
INTRAVENOUS | Status: DC
  Administered 2013-10-17: 1000 mL via INTRAVENOUS

## 2013-10-17 MED ORDER — MEPERIDINE HCL 100 MG/ML IJ SOLN
INTRAMUSCULAR | Status: AC
  Filled 2013-10-17: qty 2

## 2013-10-17 MED ORDER — STERILE WATER FOR IRRIGATION IR SOLN
Status: DC | PRN
  Administered 2013-10-17: 12:00:00

## 2013-10-17 MED ORDER — MEPERIDINE HCL 100 MG/ML IJ SOLN
INTRAMUSCULAR | Status: DC | PRN
  Administered 2013-10-17: 25 mg via INTRAVENOUS

## 2013-10-17 NOTE — Discharge Instructions (Signed)
YOU HAVE A POCKET AT THE TOP OF YOUR ESOPHAGUS. You have a stricture near the TOP of your esophagus. I dilated your esophagus. You have gastritis & GASTRIC POLYPS. I biopsied your stomach & POLYPS.   CRUSH MEDS IF POSSIBLE. I REVIEWED HER CURRENT MED LIST, SHE CAN CRUSH EVERYTHING EXCEPT CELEBREX AND IMDUR.  CONTINUE OMEPRAZOLE. TAKE EVERY MORNING.  FOLLOW A SOFT MECHANICAL DIET.  MEATS SHOULD BE CHOPPED OR GROUND ONLY. DO NOT EAT CHUNKS OF ANYTHING. SEE INFO BELOW.  YOUR BIOPSY WILL BE BACK IN 7 DAYS.  REPEAT ENDOSCOPY IN 2 WEEKS.  FOLLOW UP IN 4 MOS.   UPPER ENDOSCOPY AFTER CARE Read the instructions outlined below and refer to this sheet in the next week. These discharge instructions provide you with general information on caring for yourself after you leave the hospital. While your treatment has been planned according to the most current medical practices available, unavoidable complications occasionally occur. If you have any problems or questions after discharge, call DR. Anala Whisenant, [phone].  ACTIVITY  You may resume your regular activity, but move at a slower pace for the next 24 hours.   Take frequent rest periods for the next 24 hours.   Walking will help get rid of the air and reduce the bloated feeling in your belly (abdomen).   No driving for 24 hours (because of the medicine (anesthesia) used during the test).   You may shower.   Do not sign any important legal documents or operate any machinery for 24 hours (because of the anesthesia used during the test).    NUTRITION  Drink plenty of fluids.   You may resume your normal diet as instructed by your doctor.   Begin with a light meal and progress to your normal diet. Heavy or fried foods are harder to digest and may make you feel sick to your stomach (nauseated).   Avoid alcoholic beverages for 24 hours or as instructed.    MEDICATIONS  You may resume your normal medications.   WHAT YOU CAN EXPECT  TODAY  Some feelings of bloating in the abdomen.   Passage of more gas than usual.    IF YOU HAD A BIOPSY TAKEN DURING THE UPPER ENDOSCOPY:  Eat a soft diet IF YOU HAVE NAUSEA, BLOATING, ABDOMINAL PAIN, OR VOMITING.    FINDING OUT THE RESULTS OF YOUR TEST Not all test results are available during your visit. DR. Darrick PennaFIELDS WILL CALL YOU WITHIN 7 DAYS OF YOUR PROCEDUE WITH YOUR RESULTS. Do not assume everything is normal if you have not heard from DR. Joelie Schou IN ONE WEEK, CALL HER OFFICE AT [phone].  SEEK IMMEDIATE MEDICAL ATTENTION AND CALL THE OFFICE: [phone] IF:  You have more than a spotting of blood in your stool.   Your belly is swollen (abdominal distention).   You are nauseated or vomiting.   You have a temperature over 101F.   You have abdominal pain or discomfort that is severe or gets worse throughout the day.    SOFT MECHANICAL DIET This SOFT MECHANICAL DIET is restricted to:  Foods that are moist, soft-textured, and easy to chew and swallow.   Meats that are ground or are minced no larger than one-quarter inch pieces. Meats are moist with gravy or sauce added.   Foods that do not include bread or bread-like textures except soft pancakes, well-moistened with syrup or sauce.   Textures with some chewing ability required.   Casseroles without rice.   Cooked vegetables that are less  than half an inch in size and easily mashed with a fork. No cooked corn, peas, broccoli, cauliflower, cabbage, Brussels sprouts, asparagus, or other fibrous, non-tender or rubbery cooked vegetables.   Canned fruit except for pineapple. Fruit must be cut into pieces no larger than half an inch in size.   Foods that do not include nuts, seeds, coconut, or sticky textures.   FOOD TEXTURES FOR DYSPHAGIA DIET LEVEL 2 -SOFT MECHANICAL DIET (includes all foods on Dysphagia Diet Level 1 - Pureed, in addition to the foods listed below)  FOOD GROUP: Breads. RECOMMENDED: Soft  pancakes, well-moistened with syrup or sauce.  AVOID: All others.  FOOD GROUP: Cereals.  RECOMMENDED: Cooked cereals with little texture, including oatmeal. Unprocessed wheat bran stirred into cereals for bulk. Note: If thin liquids are restricted, it is important that all of the liquid is absorbed into the cereal.  AVOID: All dry cereals and any cooked cereals that may contain flax seeds or other seeds or nuts. Whole-grain, dry, or coarse cereals. Cereals with nuts, seeds, dried fruit, and/or coconut.  FOOD GROUP: Desserts. RECOMMENDED: Pudding, custard. Soft fruit pies with bottom crust only. Canned fruit (excluding pineapple). Soft, moist cakes with icing.Frozen malts, milk shakes, frozen yogurt, eggnog, nutritional supplements, ice cream, sherbet, regular or sugar-free gelatin, or any foods that become thin liquid at either room (70 F) or body temperature (98 F).  AVOID: Dry, coarse cakes and cookies. Anything with nuts, seeds, coconut, pineapple, or dried fruit. Breakfast yogurt with nuts. Rice or bread pudding.  FOOD GROUP: Fats. RECOMMENDED: Butter, margarine, cream for cereal (depending on liquid consistency recommendations), gravy, cream sauces, sour cream, sour cream dips with soft additives, mayonnaise, salad dressings, cream cheese, cream cheese spreads with soft additives, whipped toppings.  AVOID: All fats with coarse or chunky additives.  FOOD GROUP: Fruits. RECOMMENDED: Soft drained, canned, or cooked fruits without seeds or skin. Fresh soft and ripe banana. Fruit juices with a small amount of pulp. If thin liquids are restricted, fruit juices should be thickened to appropriate consistency.  AVOID: Fresh or frozen fruits. Cooked fruit with skin or seeds. Dried fruits. Fresh, canned, or cooked pineapple.  FOOD GROUP: Meats and Meat Substitutes. (Meat pieces should not exceed 1/4 of an inch cube and should be tender.) RECOMMENDED: Moistened ground or cooked meat, poultry,  or fish. Moist ground or tender meat may be served with gravy or sauce. Casseroles without rice. Moist macaroni and cheese, well-cooked pasta with meat sauce, tuna noodle casserole, soft, moist lasagna. Moist meatballs, meatloaf, or fish loaf. Protein salads, such as tuna or egg without large chunks, celery, or onion. Cottage cheese, smooth quiche without large chunks. Poached, scrambled, or soft-cooked eggs (egg yolks should not be runny but should be moist and able to be mashed with butter, margarine, or other moisture added to them). (Cook eggs to 160 F or use pasteurized eggs for safety.) Souffls may have small, soft chunks. Tofu. Well-cooked, slightly mashed, moist legumes, such as baked beans. All meats or protein substitutes should be served with sauces or moistened to help maintain cohesiveness in the oral cavity.  AVOID: Dry meats, tough meats (such as bacon, sausage, hot dogs, bratwurst). Dry casseroles or casseroles with rice or large chunks. Peanut butter. Cheese slices and cubes. Hard-cooked or crisp fried eggs. Sandwiches.Pizza.  FOOD GROUP: Potatoes and Starches. RECOMMENDED: Well-cooked, moistened, boiled, baked, or mashed potatoes. Well-cooked shredded hash brown potatoes that are not crisp. (All potatoes need to be moist and in sauces.)Well-cooked noodles in  sauce. Spaetzel or soft dumplings that have been moistened with butter or gravy.  AVOID: Potato skins and chips. Fried or French-fried potatoes. Rice.  FOOD GROUP: Soups. RECOMMENDED: Soups with easy-to-chew or easy-to-swallow meats or vegetables: Particle sizes in soups should be less than 1/2 inch. Soups will need to be thickened to appropriate consistency if soup is thinner than prescribed liquid consistency.  AVOID: Soups with large chunks of meat and vegetables. Soups with rice, corn, peas.  FOOD GROUP: Vegetables. RECOMMENDED: All soft, well-cooked vegetables. Vegetables should be less than a half inch. Should be  easily mashed with a fork.  AVOID: Cooked corn and peas. Broccoli, cabbage, Brussels sprouts, asparagus, or other fibrous, non-tender or rubbery cooked vegetables.  FOOD GROUP: Miscellaneous. RECOMMENDED: Jams and preserves without seeds, jelly. Sauces, salsas, etc., that may have small tender chunks less than 1/2 inch. Soft, smooth chocolate bars that are easily chewed.  AVOID: Seeds, nuts, coconut, or sticky foods. Chewy candies such as caramels or licorice.  Gastritis  Gastritis is an inflammation (the body's way of reacting to injury and/or infection) of the stomach. It is often caused by viral or bacterial (germ) infections. It can also be caused BY ASPIRIN, BC/GOODY POWDER'S, (IBUPROFEN) MOTRIN, OR ALEVE (NAPROXEN), chemicals (including alcohol), SPICY FOODS, and medications. This illness may be associated with generalized malaise (feeling tired, not well), UPPER ABDOMINAL STOMACH cramps, and fever. One common bacterial cause of gastritis is an organism known as H. Pylori. This can be treated with antibiotics.    ESOPHAGEAL STRICTURE  Esophageal strictures can be caused by stomach acid backing up into the tube that carries food from the mouth down to the stomach (lower esophagus).  TREATMENT There are a number of non-prescription medicines used to treat reflux/stricture, including: Antacids.  Proton-pump inhibitors: OMEPRAZOLE  HOME CARE INSTRUCTIONS Eat 2-3 hours before going to bed.  Try to reach and maintain a healthy weight.  Do not eat just a few very large meals. Instead, eat 4 TO 6 smaller meals throughout the day.  Try to identify foods and beverages that make your symptoms worse, and avoid these.  Avoid tight clothing.  Do not exercise right after eating.

## 2013-10-17 NOTE — Op Note (Signed)
[place], [address]   ENDOSCOPY PROCEDURE REPORT  PATIENT: Candace Cervantes, Candace Cervantes  MR#: [id number] BIRTHDATE: 11-14-1924 , 88  yrs. old GENDER: Female  ENDOSCOPIST: Barney Drain, MD REFFERED CL:EXNTZGYF Moshe Cipro, M.D.  PROCEDURE DATE:  10/17/2013 PROCEDURE:   EGD with biopsy and EGD with dilatation over guidewire  INDICATIONS:1.  dysphagia. LAST EGD 2008-ZENKERS DIVERTIRTICULUM. MEDICATIONS: Demerol 25 mg IV and Versed 3 mg IV TOPICAL ANESTHETIC: Cetacaine Spray  DESCRIPTION OF PROCEDURE:   After the risks benefits and alternatives of the procedure were thoroughly explained, informed consent was obtained.  The [id number] ([id number]) and [id number] ([id number])  endoscope was introduced through the mouth and advanced to the second portion of the duodenum. The instrument was slowly withdrawn as the mucosa was carefully examined.  Prior to withdrawal of the scope, the guidwire was placed.  The esophagus was dilated successfully.  The patient was recovered in endoscopy and discharged home in satisfactory condition.   ESOPHAGUS: A Zenker's diverticulum with a large opening was found at the cricopharyngeus.   A stricture was found at the cricopharyngeus.  The stenosis was traversable with the PEDIATRIC GASTROSCOPE.   STOMACH: Moderate non-erosive gastritis (inflammation) was found in the gastric antrum.  Multiple biopsies were performed.   An innumerable number of small medium sized smooth sessile polyps were found in the gastric body and gastric fundus.  Multiple biopsies was performed.   DUODENUM: The duodenal mucosa showed no abnormalities in the bulb and second portion of the duodenum.   Dilation was then performed at the proximal esophagus Dilator: Savary over guidewire Size(s): 10-12.8 MM Resistance: minimal TO MODERATE . FOLLOWING DILATION THE ADULT GASTROSCCOPE WAS PASSED AND A MUCOSAL TEAR IN PROXIMAL ESOPHAGUS WAS SEEN..  COMPLICATIONS: There  were no complications.  ENDOSCOPIC IMPRESSION: 1.   Zenker's diverticulum with a large opening at the cricopharyngeus 2.   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   MODERATE Non-erosive gastritis 4.   Innumerable GASTRIC Polyps  RECOMMENDATIONS: CRUSH MEDS IF POSSIBLE.  I REVIEWED HER CURRENT MED LIST WITH PHARMACY([facility]), SHE CAN CRUSH EVERYTHING EXCEPT CELEBREX AND IMDUR. CONTINUE OMEPRAZOLE.  TAKE EVERY MORNING. FOLLOW A SOFT MECHANICAL DIET.  MEATS SHOULD BE CHOPPED OR GROUND ONLY.  DO NOT EAT CHUNKS OF ANYTHING. BIOPSY WILL BE BACK IN 7 DAYS. REPEAT EGD/DIL IN 2 WEEKS. FOLLOW UP IN 4 MOS.      _______________________________ Lorrin MaisBarney Drain, MD 10/17/2013 2:10 PM      PATIENT NAME:  Candace Cervantes MR#: [id number]

## 2013-10-17 NOTE — H&P (Signed)
Primary Care Physician:  [name], MD Primary Gastroenterologist:  Dr. Oneida Alar  Pre-Procedure History & Physical: HPI:  Candace Cervantes is a 80 y.o. female here for [place].  Past Medical History  Diagnosis Date  . Pacemaker 2002    Permanent Placement  . SSS (sick sinus syndrome)   . Pacemaker 2007    Generator Changed   . Hyperlipidemia   . Hypertension   . CAD (coronary artery disease) 2005    Nonobstructive 40% RCA lesion and normal ejection fraction at cath   . Gastroesophageal reflux disease   . Osteoporosis   . Anxiety disorder   . Osteoarthritis   . Sinoatrial node dysfunction   . Zenker diverticulum     previously evaluated by Dr. Erik Obey in 2440.     Past Surgical History  Procedure Laterality Date  . Total abdominal hysterectomy  2002  . Cholecystectomy    . Cystectomy      from back of the neck  . Pacemaker insertion  2002  . Pacemaker placement  2007    replacement   . Esophagogastroduodenoscopy  2008    Dr. Oneida Alar, difficulty passing scope through UES, incomplete fibrous ring at GEJ, hh, multiple benign gastri polyps  . Colonoscopy  2003    no polyps.    Prior to Admission medications   Medication Sig Start Date End Date Taking? Authorizing Provider  ALPRAZolam Duanne Moron) 0.25 MG tablet Take 0.25 mg by mouth at bedtime as needed for anxiety. 07/26/13  Yes Fayrene Helper, MD  celecoxib (CELEBREX) 200 MG capsule Take 200 mg by mouth daily as needed for mild pain.  03/13/11  Yes Fayrene Helper, MD  citalopram (CELEXA) 10 MG tablet Take 10 mg by mouth daily.   Yes Historical Provider, MD  fenofibrate (TRICOR) 145 MG tablet Take 145 mg by mouth daily. 01/02/13  Yes Fayrene Helper, MD  isosorbide mononitrate (IMDUR) 30 MG 24 hr tablet Take 30 mg by mouth daily. 07/26/13  Yes Fayrene Helper, MD  Multiple Vitamin (MULTIVITAMIN WITH MINERALS) TABS tablet Take 1 tablet by mouth daily.   Yes Historical Provider, MD  omeprazole (PRILOSEC) 20 MG  capsule Take 20 mg by mouth daily. 07/26/13  Yes Fayrene Helper, MD  potassium chloride 20 MEQ/15ML (10%) solution Take 15 mLs (20 mEq total) by mouth daily. 07/26/13  Yes Fayrene Helper, MD  triamterene-hydrochlorothiazide (MAXZIDE-25) 37.5-25 MG per tablet Take 1.5 tablets by mouth daily. 07/26/13  Yes Fayrene Helper, MD  aspirin EC 81 MG tablet Take 81 mg by mouth every other day.    Historical Provider, MD  nitroGLYCERIN (NITROSTAT) 0.4 MG SL tablet Place 1 tablet (0.4 mg total) under the tongue every 5 (five) minutes as needed for chest pain. One tablet under the tongue at onset of chest pains, repeat every five minutes as needed 07/17/12   Rexene Alberts, MD    Allergies as of 10/13/2013  . (No Known Allergies)    Family History  Problem Relation Age of Onset  . Lung disease Sister   . Cancer Brother     prostate  . Heart disease Mother   . Colon cancer Other     sibling, age 78    History   Social History  . Marital Status: Married    Spouse Name: N/A    Number of Children: 2  . Years of Education: N/A   Occupational History  . Not on file.   Social History Main Topics  .  Smoking status: Never Smoker   . Smokeless tobacco: Not on file  . Alcohol Use: No  . Drug Use: No  . Sexual Activity: Not on file   Other Topics Concern  . Not on file   Social History Narrative  . No narrative on file    Review of Systems: See HPI, otherwise negative ROS   Physical Exam: BP 143/72  Temp(Src) 98.6 F (37 C) (Oral)  Resp 18  Ht 5\' 10"  (1.778 m)  Wt 161 lb (73.029 kg)  BMI 23.10 kg/m2  SpO2 95% General:   Alert,  pleasant and cooperative in NAD Head:  Normocephalic and atraumatic. Neck:  Supple; Lungs:  Clear throughout to auscultation.    Heart:  Regular rate and rhythm. Abdomen:  Soft, nontender and nondistended. Normal bowel sounds, without guarding, and without rebound.   Neurologic:  Alert and  oriented x4;  grossly normal  neurologically.  Impression/Plan:     DYSPHAGIA  PLAN:  EGD/DIL TODAY

## 2013-10-18 ENCOUNTER — Encounter (HOSPITAL_COMMUNITY): Payer: Self-pay | Admitting: Pharmacy Technician

## 2013-10-18 ENCOUNTER — Telehealth: Payer: Self-pay | Admitting: Gastroenterology

## 2013-10-18 NOTE — Telephone Encounter (Signed)
I have Candace Cervantes scheduled on Monday 1/26 for EGD/ED w/SLF I have mailed her instructions and I have also LMOM for her

## 2013-10-18 NOTE — Telephone Encounter (Signed)
Message copied by Rodney Langton on Wed Oct 18, 2013  8:03 AM ------      Message from: Danie Binder      Created: Tue Oct 17, 2013 12:40 PM       EGD/DIL IN 2 WEEKS DX: ESO STRICTURE ------

## 2013-10-19 ENCOUNTER — Encounter (HOSPITAL_COMMUNITY): Payer: Self-pay | Admitting: Gastroenterology

## 2013-10-26 ENCOUNTER — Ambulatory Visit (INDEPENDENT_AMBULATORY_CARE_PROVIDER_SITE_OTHER): Payer: Medicare Other | Admitting: Family Medicine

## 2013-10-26 ENCOUNTER — Encounter: Payer: Self-pay | Admitting: Family Medicine

## 2013-10-26 VITALS — BP 140/82 | HR 65 | Resp 16 | Ht 69.0 in | Wt 165.1 lb

## 2013-10-26 DIAGNOSIS — R5383 Other fatigue: Secondary | ICD-10-CM

## 2013-10-26 DIAGNOSIS — E785 Hyperlipidemia, unspecified: Secondary | ICD-10-CM

## 2013-10-26 DIAGNOSIS — R5381 Other malaise: Secondary | ICD-10-CM

## 2013-10-26 DIAGNOSIS — F411 Generalized anxiety disorder: Secondary | ICD-10-CM

## 2013-10-26 DIAGNOSIS — I4891 Unspecified atrial fibrillation: Secondary | ICD-10-CM

## 2013-10-26 DIAGNOSIS — I10 Essential (primary) hypertension: Secondary | ICD-10-CM

## 2013-10-26 DIAGNOSIS — R7301 Impaired fasting glucose: Secondary | ICD-10-CM

## 2013-10-26 DIAGNOSIS — F068 Other specified mental disorders due to known physiological condition: Secondary | ICD-10-CM

## 2013-10-26 DIAGNOSIS — Z1211 Encounter for screening for malignant neoplasm of colon: Secondary | ICD-10-CM

## 2013-10-26 DIAGNOSIS — R1310 Dysphagia, unspecified: Secondary | ICD-10-CM

## 2013-10-26 DIAGNOSIS — Z139 Encounter for screening, unspecified: Secondary | ICD-10-CM

## 2013-10-26 LAB — BASIC METABOLIC PANEL WITH GFR
BUN: 19 mg/dL (ref 6–23)
CALCIUM: 9.6 mg/dL (ref 8.4–10.5)
CO2: 32 mEq/L (ref 19–32)
CREATININE: 1.08 mg/dL (ref 0.50–1.10)
Chloride: 102 mEq/L (ref 96–112)
GFR, EST AFRICAN AMERICAN: 54 mL/min — AB
GFR, Est Non African American: 47 mL/min — ABNORMAL LOW
Glucose, Bld: 89 mg/dL (ref 70–99)
Potassium: 3.9 mEq/L (ref 3.5–5.3)
Sodium: 140 mEq/L (ref 135–145)

## 2013-10-26 LAB — POC HEMOCCULT BLD/STL (OFFICE/1-CARD/DIAGNOSTIC): Fecal Occult Blood, POC: NEGATIVE

## 2013-10-26 LAB — LIPID PANEL
Cholesterol: 239 mg/dL — ABNORMAL HIGH (ref 0–200)
HDL: 79 mg/dL (ref >39)
LDL Cholesterol: 140 mg/dL — ABNORMAL HIGH (ref 0–99)
TRIGLYCERIDES: 100 mg/dL (ref <150)
Total CHOL/HDL Ratio: 3 Ratio
VLDL: 20 mg/dL (ref 0–40)

## 2013-10-26 LAB — HEMOGLOBIN A1C
HEMOGLOBIN A1C: 5.6 % (ref <5.7)
Mean Plasma Glucose: 114 mg/dL (ref <117)

## 2013-10-26 MED ORDER — CITALOPRAM HYDROBROMIDE 10 MG PO TABS: 10.0000 mg | ORAL_TABLET | Freq: Every day | ORAL | Status: DC

## 2013-10-26 NOTE — Progress Notes (Signed)
   Subjective:    Patient ID: Candace Cervantes, female    DOB: 1925-05-09, 80 y.o.   MRN: [id number]  HPI The PT is here for follow up and re-evaluation of chronic medical conditions, medication management and review of any available recent lab and radiology data.  Preventive health is updated, specifically  Cancer screening and Immunization.   Questions or concerns regarding consultations or procedures which the PT has had in the interim are  Addressed.Needs rept upper endo for dysphagia The PT denies any adverse reactions to current medications since the last visit.  There are no new concerns.  There are no specific complaints       Review of Systems See HPI Denies recent fever or chills. Denies sinus pressure, nasal congestion, ear pain or sore throat. Denies chest congestion, productive cough or wheezing. Denies chest pains, palpitations and leg swelling Denies abdominal pain, nausea, vomiting,diarrhea or constipation.   Denies dysuria, frequency, hesitancy or incontinence. Denies uncontrolled  joint pain, swelling and limitation in mobility. Denies headaches, seizures, numbness, or tingling. Denies depression, uncontrolled anxiety or insomnia. Denies skin break down or rash.        Objective:   Physical Exam  Patient alert and oriented and in no cardiopulmonary distress.  HEENT: No facial asymetry, EOMI, neck decreased ROM. No sinus tenderness  Chest: Clear to auscultation bilaterally.  CVS: S1, S2 no murmurs, no S3.  ABD: Soft non tender. Bowel sounds normal. Rectal: no mass, heme negative stool Ext: No edema  MS: Adequate though reduced  ROM spine, shoulders, hips and knees.  Skin: Intact, no ulcerations or rash noted.  Psych: Good eye contact, normal affect. Memory impaired not anxious or depressed appearing.  CNS: CN 2-12 intact, power,  normal throughout.       Assessment & Plan:

## 2013-10-26 NOTE — Patient Instructions (Signed)
F/u with [facility] in early May , call if you need me before  Lipid, chem 7 and EGFR and hBA1C today and vit D  You are referred for a bone density test  All the best with rept endoscopy next week  Normal rectal exam today

## 2013-10-27 LAB — VITAMIN D 25 HYDROXY (VIT D DEFICIENCY, FRACTURES): Vit D, 25-Hydroxy: 35 ng/mL (ref 30–89)

## 2013-10-27 MED ORDER — PRAVASTATIN SODIUM 20 MG PO TABS: 20.0000 mg | ORAL_TABLET | Freq: Every evening | ORAL | Status: DC

## 2013-10-29 NOTE — Assessment & Plan Note (Signed)
Will re evaluate next visit, states she did not tolerate aricept, had been on exelon in the past

## 2013-10-29 NOTE — Assessment & Plan Note (Signed)
Unchanged, continue current medication, states her spouse is doing better with therapy so this is helpful to her

## 2013-10-29 NOTE — Assessment & Plan Note (Signed)
deteriorated and uncontrolled Hyperlipidemia:Low fat diet discussed and encouraged.  Start statin

## 2013-10-29 NOTE — Assessment & Plan Note (Signed)
Has pacemaker and rate is controlled

## 2013-10-29 NOTE — Assessment & Plan Note (Signed)
Significant pathology, has to rept upper endo within 4 weeks of recent one, no mention of cancer

## 2013-10-30 ENCOUNTER — Ambulatory Visit (HOSPITAL_COMMUNITY)
Admission: RE | Admit: 2013-10-30 | Discharge: 2013-10-30 | Disposition: A | Discharge status: Home / Self Care | Payer: Medicare Other | Source: Ambulatory Visit | Attending: Gastroenterology | Admitting: Gastroenterology

## 2013-10-30 ENCOUNTER — Encounter (HOSPITAL_COMMUNITY)
Admission: RE | Disposition: A | Discharge status: Home / Self Care | Payer: Self-pay | Source: Ambulatory Visit | Attending: Gastroenterology

## 2013-10-30 ENCOUNTER — Encounter (HOSPITAL_COMMUNITY): Payer: Self-pay | Admitting: *Deleted

## 2013-10-30 DIAGNOSIS — K225 Diverticulum of esophagus, acquired: Secondary | ICD-10-CM | POA: Insufficient documentation

## 2013-10-30 DIAGNOSIS — K222 Esophageal obstruction: Secondary | ICD-10-CM | POA: Insufficient documentation

## 2013-10-30 DIAGNOSIS — K2970 Gastritis, unspecified, without bleeding: Secondary | ICD-10-CM | POA: Insufficient documentation

## 2013-10-30 DIAGNOSIS — K2990 Gastroduodenitis, unspecified, without bleeding: Secondary | ICD-10-CM

## 2013-10-30 DIAGNOSIS — D131 Benign neoplasm of stomach: Secondary | ICD-10-CM | POA: Insufficient documentation

## 2013-10-30 DIAGNOSIS — K2960 Other gastritis without bleeding: Secondary | ICD-10-CM

## 2013-10-30 DIAGNOSIS — I10 Essential (primary) hypertension: Secondary | ICD-10-CM | POA: Insufficient documentation

## 2013-10-30 DIAGNOSIS — J392 Other diseases of pharynx: Secondary | ICD-10-CM

## 2013-10-30 DIAGNOSIS — R1310 Dysphagia, unspecified: Secondary | ICD-10-CM | POA: Insufficient documentation

## 2013-10-30 HISTORY — PX: ESOPHAGOGASTRODUODENOSCOPY (EGD) WITH ESOPHAGEAL DILATION: SHX5812

## 2013-10-30 SURGERY — ESOPHAGOGASTRODUODENOSCOPY (EGD) WITH ESOPHAGEAL DILATION
Anesthesia: Moderate Sedation

## 2013-10-30 MED ORDER — MEPERIDINE HCL 100 MG/ML IJ SOLN
INTRAMUSCULAR | Status: DC | PRN
  Administered 2013-10-30: 25 mg via INTRAVENOUS

## 2013-10-30 MED ORDER — MEPERIDINE HCL 100 MG/ML IJ SOLN
INTRAMUSCULAR | Status: AC
  Filled 2013-10-30: qty 2

## 2013-10-30 MED ORDER — MIDAZOLAM HCL 5 MG/5ML IJ SOLN
INTRAMUSCULAR | Status: DC | PRN
  Administered 2013-10-30: 2 mg via INTRAVENOUS
  Administered 2013-10-30: 1 mg via INTRAVENOUS

## 2013-10-30 MED ORDER — LIDOCAINE VISCOUS 2 % MT SOLN
OROMUCOSAL | Status: AC
  Filled 2013-10-30: qty 15

## 2013-10-30 MED ORDER — MIDAZOLAM HCL 5 MG/5ML IJ SOLN
INTRAMUSCULAR | Status: AC
  Filled 2013-10-30: qty 10

## 2013-10-30 MED ORDER — SODIUM CHLORIDE 0.9 % IV SOLN
INTRAVENOUS | Status: DC
  Administered 2013-10-30: 1000 mL via INTRAVENOUS

## 2013-10-30 NOTE — H&P (View-Only) (Signed)
Primary Care Physician:  [name], MD Primary Gastroenterologist:  Dr. Oneida Alar  Pre-Procedure History & Physical: HPI:  Candace Cervantes is a 80 y.o. female here for [place].  Past Medical History  Diagnosis Date  . Pacemaker 2002    Permanent Placement  . SSS (sick sinus syndrome)   . Pacemaker 2007    Generator Changed   . Hyperlipidemia   . Hypertension   . CAD (coronary artery disease) 2005    Nonobstructive 40% RCA lesion and normal ejection fraction at cath   . Gastroesophageal reflux disease   . Osteoporosis   . Anxiety disorder   . Osteoarthritis   . Sinoatrial node dysfunction   . Zenker diverticulum     previously evaluated by Dr. Erik Obey in 2440.     Past Surgical History  Procedure Laterality Date  . Total abdominal hysterectomy  2002  . Cholecystectomy    . Cystectomy      from back of the neck  . Pacemaker insertion  2002  . Pacemaker placement  2007    replacement   . Esophagogastroduodenoscopy  2008    Dr. Oneida Alar, difficulty passing scope through UES, incomplete fibrous ring at GEJ, hh, multiple benign gastri polyps  . Colonoscopy  2003    no polyps.    Prior to Admission medications   Medication Sig Start Date End Date Taking? Authorizing Provider  ALPRAZolam Duanne Moron) 0.25 MG tablet Take 0.25 mg by mouth at bedtime as needed for anxiety. 07/26/13  Yes Fayrene Helper, MD  celecoxib (CELEBREX) 200 MG capsule Take 200 mg by mouth daily as needed for mild pain.  03/13/11  Yes Fayrene Helper, MD  citalopram (CELEXA) 10 MG tablet Take 10 mg by mouth daily.   Yes Historical Provider, MD  fenofibrate (TRICOR) 145 MG tablet Take 145 mg by mouth daily. 01/02/13  Yes Fayrene Helper, MD  isosorbide mononitrate (IMDUR) 30 MG 24 hr tablet Take 30 mg by mouth daily. 07/26/13  Yes Fayrene Helper, MD  Multiple Vitamin (MULTIVITAMIN WITH MINERALS) TABS tablet Take 1 tablet by mouth daily.   Yes Historical Provider, MD  omeprazole (PRILOSEC) 20 MG  capsule Take 20 mg by mouth daily. 07/26/13  Yes Fayrene Helper, MD  potassium chloride 20 MEQ/15ML (10%) solution Take 15 mLs (20 mEq total) by mouth daily. 07/26/13  Yes Fayrene Helper, MD  triamterene-hydrochlorothiazide (MAXZIDE-25) 37.5-25 MG per tablet Take 1.5 tablets by mouth daily. 07/26/13  Yes Fayrene Helper, MD  aspirin EC 81 MG tablet Take 81 mg by mouth every other day.    Historical Provider, MD  nitroGLYCERIN (NITROSTAT) 0.4 MG SL tablet Place 1 tablet (0.4 mg total) under the tongue every 5 (five) minutes as needed for chest pain. One tablet under the tongue at onset of chest pains, repeat every five minutes as needed 07/17/12   Rexene Alberts, MD    Allergies as of 10/13/2013  . (No Known Allergies)    Family History  Problem Relation Age of Onset  . Lung disease Sister   . Cancer Brother     prostate  . Heart disease Mother   . Colon cancer Other     sibling, age 78    History   Social History  . Marital Status: Married    Spouse Name: N/A    Number of Children: 2  . Years of Education: N/A   Occupational History  . Not on file.   Social History Main Topics  .  Smoking status: Never Smoker   . Smokeless tobacco: Not on file  . Alcohol Use: No  . Drug Use: No  . Sexual Activity: Not on file   Other Topics Concern  . Not on file   Social History Narrative  . No narrative on file    Review of Systems: See HPI, otherwise negative ROS   Physical Exam: BP 143/72  Temp(Src) 98.6 F (37 C) (Oral)  Resp 18  Ht 5' 10" (1.778 m)  Wt 161 lb (73.029 kg)  BMI 23.10 kg/m2  SpO2 95% General:   Alert,  pleasant and cooperative in NAD Head:  Normocephalic and atraumatic. Neck:  Supple; Lungs:  Clear throughout to auscultation.    Heart:  Regular rate and rhythm. Abdomen:  Soft, nontender and nondistended. Normal bowel sounds, without guarding, and without rebound.   Neurologic:  Alert and  oriented x4;  grossly normal  neurologically.  Impression/Plan:     DYSPHAGIA  PLAN:  EGD/DIL TODAY  

## 2013-10-30 NOTE — Op Note (Signed)
[place], [address]   ENDOSCOPY PROCEDURE REPORT  PATIENT: [name], [name]  MR#: [id number] BIRTHDATE: 1925-08-16 , 88  yrs. old GENDER: Female  ENDOSCOPIST: Barney Drain, MD REFFERED KK:[id number] Moshe Cipro, M.D.  PROCEDURE DATE:  10/30/2013 PROCEDURE:   EGD with dilatation over guidewire  INDICATIONS:1.  dysphagia. MEDICATIONS: Demerol 25 mg IV and Versed 3 mg IV TOPICAL ANESTHETIC: Viscous Xylocaine  DESCRIPTION OF PROCEDURE:   After the risks benefits and alternatives of the procedure were thoroughly explained, informed consent was obtained.  The EG-[id number] ([id number])  endoscope was introduced through the mouth and advanced to the second portion of the duodenum. The instrument was slowly withdrawn as the mucosa was carefully examined.  Prior to withdrawal of the scope, the guidwire was placed.  The esophagus was dilated successfully.  The patient was recovered in endoscopy and discharged home in satisfactory condition.   ESOPHAGUS: A Zenker's diverticulum with a large opening was found at the cricopharyngeus.   A stricture was found at the cricopharyngeus.  The stenosis was traversable with the endoscope. STOMACH: An innumerable number of small sessile polyps were found in the gastric body and gastric fundus.   Mild non-erosive gastritis (inflammation) was found in the gastric antrum. DUODENUM: The duodenal mucosa showed no abnormalities in the bulb and second portion of the duodenum.   Dilation was then performed at the proximal esophagus Dilator: Savary over guidewire Size(s): 12.8-16 MM Resistance: minimal Heme: yes  COMPLICATIONS: There were no complications.  ENDOSCOPIC IMPRESSION: 1.   Zenker's diverticulum with a large opening at the cricopharyngeus 2.   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   Innumerable FUNDIC GLAND POLYPS 4.   MILD NSAID GASTRITIS  RECOMMENDATIONS: CRUSH MEDS IF POSSIBLE. CONTINUE OMEPRAZOLE.  TAKE  EVERY MORNING. FOLLOW A SOFT MECHANICAL DIET.  MEATS SHOULD BE CHOPPED OR GROUND ONLY.  DO NOT EAT CHUNKS OF ANYTHING.  MAY DEVIATE FROM SOFT DIET AS TOLERATED.  FOLLOW UP IN 4 MOS.      _______________________________ Lorrin MaisBarney Drain, MD 10/30/2013 9:18 AM      PATIENT NAME:  Candace Cervantes MR#: [id number]

## 2013-10-30 NOTE — Interval H&P Note (Signed)
History and Physical Interval Note:  10/30/2013 8:36 AM  Candace Cervantes  has presented today for surgery, with the diagnosis of ESOPHAGEAL STRICTURE  The various methods of treatment have been discussed with the patient and family. After consideration of risks, benefits and other options for treatment, the patient has consented to  Procedure(s) with comments: ESOPHAGOGASTRODUODENOSCOPY (EGD) WITH ESOPHAGEAL DILATION (N/A) - 830 as a surgical intervention .  The patient's history has been reviewed, patient examined, no change in status, stable for surgery.  I have reviewed the patient's chart and labs.  Questions were answered to the patient's satisfaction.     [employer]

## 2013-10-30 NOTE — Discharge Instructions (Signed)
You have a stricture near the TOP of your esophagus. I dilated your esophagus. YOU STILL HAVE A POCKET AT THE TOP OF YOUR ESOPHAGUS WHICH MAY MAKE FOOD AND PILLS NOT GO DOWN RIGHT.  You have gastritis DUE TO ASA/CELBREX USE & BENIGN GASTRIC POLYPS.    CRUSH MEDS IF POSSIBLE. I REVIEWED HER CURRENT MED LIST, SHE CAN CRUSH EVERYTHING EXCEPT CELEBREX AND IMDUR.  CONTINUE OMEPRAZOLE. TAKE EVERY MORNING.  FOLLOW A SOFT MECHANICAL DIET.  MEATS SHOULD BE CHOPPED OR GROUND ONLY. DO NOT EAT CHUNKS OF ANYTHING. SEE INFO BELOW.  FOLLOW UP IN 4 MOS.   UPPER ENDOSCOPY AFTER CARE Read the instructions outlined below and refer to this sheet in the next week. These discharge instructions provide you with general information on caring for yourself after you leave the hospital. While your treatment has been planned according to the most current medical practices available, unavoidable complications occasionally occur. If you have any problems or questions after discharge, call DR. Yuleidy Rappleye, [phone].  ACTIVITY  You may resume your regular activity, but move at a slower pace for the next 24 hours.   Take frequent rest periods for the next 24 hours.   Walking will help get rid of the air and reduce the bloated feeling in your belly (abdomen).   No driving for 24 hours (because of the medicine (anesthesia) used during the test).   You may shower.   Do not sign any important legal documents or operate any machinery for 24 hours (because of the anesthesia used during the test).    NUTRITION  Drink plenty of fluids.   You may resume your normal diet as instructed by your doctor.   Begin with a light meal and progress to your normal diet. Heavy or fried foods are harder to digest and may make you feel sick to your stomach (nauseated).   Avoid alcoholic beverages for 24 hours or as instructed.    MEDICATIONS  You may resume your normal medications.   WHAT YOU CAN EXPECT TODAY  Some feelings  of bloating in the abdomen.   Passage of more gas than usual.    IF YOU HAD A BIOPSY TAKEN DURING THE UPPER ENDOSCOPY:  Eat a soft diet IF YOU HAVE NAUSEA, BLOATING, ABDOMINAL PAIN, OR VOMITING.    FINDING OUT THE RESULTS OF YOUR TEST Not all test results are available during your visit. DR. Oneida Alar WILL CALL YOU WITHIN 7 DAYS OF YOUR PROCEDUE WITH YOUR RESULTS. Do not assume everything is normal if you have not heard from DR. Kapena Hamme IN ONE WEEK, CALL HER OFFICE AT [phone].  SEEK IMMEDIATE MEDICAL ATTENTION AND CALL THE OFFICE: [phone] IF:  You have more than a spotting of blood in your stool.   Your belly is swollen (abdominal distention).   You are nauseated or vomiting.   You have a temperature over 101F.   You have abdominal pain or discomfort that is severe or gets worse throughout the day.    SOFT MECHANICAL DIET This SOFT MECHANICAL DIET is restricted to:  Foods that are moist, soft-textured, and easy to chew and swallow.   Meats that are ground or are minced no larger than one-quarter inch pieces. Meats are moist with gravy or sauce added.   Foods that do not include bread or bread-like textures except soft pancakes, well-moistened with syrup or sauce.   Textures with some chewing ability required.   Casseroles without rice.   Cooked vegetables that are less than half an  inch in size and easily mashed with a fork. No cooked corn, peas, broccoli, cauliflower, cabbage, Brussels sprouts, asparagus, or other fibrous, non-tender or rubbery cooked vegetables.   Canned fruit except for pineapple. Fruit must be cut into pieces no larger than half an inch in size.   Foods that do not include nuts, seeds, coconut, or sticky textures.   FOOD TEXTURES FOR DYSPHAGIA DIET LEVEL 2 -SOFT MECHANICAL DIET (includes all foods on Dysphagia Diet Level 1 - Pureed, in addition to the foods listed below)  FOOD GROUP: Breads. RECOMMENDED: Soft pancakes, well-moistened with  syrup or sauce.  AVOID: All others.  FOOD GROUP: Cereals.  RECOMMENDED: Cooked cereals with little texture, including oatmeal. Unprocessed wheat bran stirred into cereals for bulk. Note: If thin liquids are restricted, it is important that all of the liquid is absorbed into the cereal.  AVOID: All dry cereals and any cooked cereals that may contain flax seeds or other seeds or nuts. Whole-grain, dry, or coarse cereals. Cereals with nuts, seeds, dried fruit, and/or coconut.  FOOD GROUP: Desserts. RECOMMENDED: Pudding, custard. Soft fruit pies with bottom crust only. Canned fruit (excluding pineapple). Soft, moist cakes with icing.Frozen malts, milk shakes, frozen yogurt, eggnog, nutritional supplements, ice cream, sherbet, regular or sugar-free gelatin, or any foods that become thin liquid at either room (70 F) or body temperature (98 F).  AVOID: Dry, coarse cakes and cookies. Anything with nuts, seeds, coconut, pineapple, or dried fruit. Breakfast yogurt with nuts. Rice or bread pudding.  FOOD GROUP: Fats. RECOMMENDED: Butter, margarine, cream for cereal (depending on liquid consistency recommendations), gravy, cream sauces, sour cream, sour cream dips with soft additives, mayonnaise, salad dressings, cream cheese, cream cheese spreads with soft additives, whipped toppings.  AVOID: All fats with coarse or chunky additives.  FOOD GROUP: Fruits. RECOMMENDED: Soft drained, canned, or cooked fruits without seeds or skin. Fresh soft and ripe banana. Fruit juices with a small amount of pulp. If thin liquids are restricted, fruit juices should be thickened to appropriate consistency.  AVOID: Fresh or frozen fruits. Cooked fruit with skin or seeds. Dried fruits. Fresh, canned, or cooked pineapple.  FOOD GROUP: Meats and Meat Substitutes. (Meat pieces should not exceed 1/4 of an inch cube and should be tender.) RECOMMENDED: Moistened ground or cooked meat, poultry, or fish. Moist ground or  tender meat may be served with gravy or sauce. Casseroles without rice. Moist macaroni and cheese, well-cooked pasta with meat sauce, tuna noodle casserole, soft, moist lasagna. Moist meatballs, meatloaf, or fish loaf. Protein salads, such as tuna or egg without large chunks, celery, or onion. Cottage cheese, smooth quiche without large chunks. Poached, scrambled, or soft-cooked eggs (egg yolks should not be runny but should be moist and able to be mashed with butter, margarine, or other moisture added to them). (Cook eggs to 160 F or use pasteurized eggs for safety.) Souffls may have small, soft chunks. Tofu. Well-cooked, slightly mashed, moist legumes, such as baked beans. All meats or protein substitutes should be served with sauces or moistened to help maintain cohesiveness in the oral cavity.  AVOID: Dry meats, tough meats (such as bacon, sausage, hot dogs, bratwurst). Dry casseroles or casseroles with rice or large chunks. Peanut butter. Cheese slices and cubes. Hard-cooked or crisp fried eggs. Sandwiches.Pizza.  FOOD GROUP: Potatoes and Starches. RECOMMENDED: Well-cooked, moistened, boiled, baked, or mashed potatoes. Well-cooked shredded hash brown potatoes that are not crisp. (All potatoes need to be moist and in sauces.)Well-cooked noodles in sauce. Spaetzel or  soft dumplings that have been moistened with butter or gravy.  AVOID: Potato skins and chips. Fried or French-fried potatoes. Rice.  FOOD GROUP: Soups. RECOMMENDED: Soups with easy-to-chew or easy-to-swallow meats or vegetables: Particle sizes in soups should be less than 1/2 inch. Soups will need to be thickened to appropriate consistency if soup is thinner than prescribed liquid consistency.  AVOID: Soups with large chunks of meat and vegetables. Soups with rice, corn, peas.  FOOD GROUP: Vegetables. RECOMMENDED: All soft, well-cooked vegetables. Vegetables should be less than a half inch. Should be easily mashed with a  fork.  AVOID: Cooked corn and peas. Broccoli, cabbage, Brussels sprouts, asparagus, or other fibrous, non-tender or rubbery cooked vegetables.  FOOD GROUP: Miscellaneous. RECOMMENDED: Jams and preserves without seeds, jelly. Sauces, salsas, etc., that may have small tender chunks less than 1/2 inch. Soft, smooth chocolate bars that are easily chewed.  AVOID: Seeds, nuts, coconut, or sticky foods. Chewy candies such as caramels or licorice.  Gastritis  Gastritis is an inflammation (the body's way of reacting to injury and/or infection) of the stomach. It is often caused by viral or bacterial (germ) infections. It can also be caused BY ASPIRIN, BC/GOODY POWDER'S, (IBUPROFEN) MOTRIN, OR ALEVE (NAPROXEN), chemicals (including alcohol), SPICY FOODS, and medications. This illness may be associated with generalized malaise (feeling tired, not well), UPPER ABDOMINAL STOMACH cramps, and fever. One common bacterial cause of gastritis is an organism known as H. Pylori. This can be treated with antibiotics.    ESOPHAGEAL STRICTURE  Esophageal strictures can be caused by stomach acid backing up into the tube that carries food from the mouth down to the stomach (lower esophagus).  TREATMENT There are a number of non-prescription medicines used to treat reflux/stricture, including: Antacids.  Proton-pump inhibitors: OMEPRAZOLE  HOME CARE INSTRUCTIONS Eat 2-3 hours before going to bed.  Try to reach and maintain a healthy weight.  Do not eat just a few very large meals. Instead, eat 4 TO 6 smaller meals throughout the day.  Try to identify foods and beverages that make your symptoms worse, and avoid these.  Avoid tight clothing.  Do not exercise right after eating.

## 2013-11-01 ENCOUNTER — Encounter (HOSPITAL_COMMUNITY): Payer: Self-pay | Admitting: Gastroenterology

## 2013-11-01 ENCOUNTER — Telehealth: Payer: Self-pay | Admitting: Gastroenterology

## 2013-11-01 NOTE — Telephone Encounter (Signed)
DISCUSSED WITH PT & FAMILY IN ENDO JAN 25. OPV IN 4 MOS E30 SLF DYSPHAGIA.

## 2013-11-02 NOTE — Telephone Encounter (Signed)
Reminder in epic °

## 2013-11-22 ENCOUNTER — Encounter: Payer: Self-pay | Admitting: *Deleted

## 2013-11-24 ENCOUNTER — Other Ambulatory Visit: Payer: Self-pay | Admitting: Family Medicine

## 2013-12-14 ENCOUNTER — Other Ambulatory Visit: Payer: Self-pay

## 2013-12-14 MED ORDER — MECLIZINE HCL 25 MG PO TABS: ORAL_TABLET | ORAL | Status: DC

## 2014-01-01 ENCOUNTER — Other Ambulatory Visit: Payer: Self-pay

## 2014-01-01 MED ORDER — NITROGLYCERIN 0.4 MG SL SUBL: 0.4000 mg | SUBLINGUAL_TABLET | SUBLINGUAL | Status: DC | PRN

## 2014-01-12 ENCOUNTER — Ambulatory Visit (INDEPENDENT_AMBULATORY_CARE_PROVIDER_SITE_OTHER): Payer: Medicare Other | Admitting: *Deleted

## 2014-01-12 DIAGNOSIS — I495 Sick sinus syndrome: Secondary | ICD-10-CM

## 2014-01-12 DIAGNOSIS — I4891 Unspecified atrial fibrillation: Secondary | ICD-10-CM

## 2014-01-12 LAB — MDC_IDC_ENUM_SESS_TYPE_INCLINIC
Battery Impedance: 1500 Ohm
Battery Voltage: 2.79 V
Brady Statistic RA Percent Paced: 84 %
Brady Statistic RV Percent Paced: 8.2 %
Date Time Interrogation Session: 20150410151907
Lead Channel Impedance Value: 387 Ohm
Lead Channel Pacing Threshold Amplitude: 0.5 V
Lead Channel Pacing Threshold Pulse Width: 0.5 ms
Lead Channel Sensing Intrinsic Amplitude: 1.5 mV
Lead Channel Setting Pacing Amplitude: 2.5 V
MDC IDC MSMT LEADCHNL RV IMPEDANCE VALUE: 474 Ohm
MDC IDC MSMT LEADCHNL RV PACING THRESHOLD AMPLITUDE: 0.75 V
MDC IDC MSMT LEADCHNL RV PACING THRESHOLD PULSEWIDTH: 0.5 ms
MDC IDC MSMT LEADCHNL RV SENSING INTR AMPL: 5.8 mV
MDC IDC PG SERIAL: 1794510
MDC IDC SET LEADCHNL RA PACING AMPLITUDE: 2 V
MDC IDC SET LEADCHNL RV PACING PULSEWIDTH: 0.5 ms
MDC IDC SET LEADCHNL RV SENSING SENSITIVITY: 2 mV

## 2014-01-12 NOTE — Progress Notes (Signed)
PPM check in office. 

## 2014-01-31 ENCOUNTER — Encounter: Payer: Self-pay | Admitting: Internal Medicine

## 2014-02-06 ENCOUNTER — Other Ambulatory Visit: Payer: Self-pay | Admitting: Family Medicine

## 2014-02-07 ENCOUNTER — Other Ambulatory Visit: Payer: Self-pay | Admitting: Family Medicine

## 2014-02-07 LAB — COMPREHENSIVE METABOLIC PANEL
ALBUMIN: 4.3 g/dL (ref 3.5–5.2)
ALT: 15 U/L (ref 0–35)
AST: 22 U/L (ref 0–37)
Alkaline Phosphatase: 40 U/L (ref 39–117)
BUN: 17 mg/dL (ref 6–23)
CALCIUM: 10.4 mg/dL (ref 8.4–10.5)
CHLORIDE: 101 meq/L (ref 96–112)
CO2: 30 meq/L (ref 19–32)
Creat: 1.15 mg/dL — ABNORMAL HIGH (ref 0.50–1.10)
GLUCOSE: 95 mg/dL (ref 70–99)
POTASSIUM: 4 meq/L (ref 3.5–5.3)
SODIUM: 140 meq/L (ref 135–145)
Total Bilirubin: 0.5 mg/dL (ref 0.2–1.2)
Total Protein: 7.2 g/dL (ref 6.0–8.3)

## 2014-02-07 LAB — LIPID PANEL
Cholesterol: 223 mg/dL — ABNORMAL HIGH (ref 0–200)
HDL: 82 mg/dL (ref >39)
LDL CALC: 130 mg/dL — AB (ref 0–99)
TRIGLYCERIDES: 54 mg/dL (ref <150)
Total CHOL/HDL Ratio: 2.7 Ratio
VLDL: 11 mg/dL (ref 0–40)

## 2014-02-07 LAB — VITAMIN D 25 HYDROXY (VIT D DEFICIENCY, FRACTURES): Vit D, 25-Hydroxy: 36 ng/mL (ref 30–89)

## 2014-02-07 LAB — TSH: TSH: 2.686 u[IU]/mL (ref 0.350–4.500)

## 2014-02-08 ENCOUNTER — Encounter: Payer: Self-pay | Admitting: Family Medicine

## 2014-02-08 ENCOUNTER — Ambulatory Visit (INDEPENDENT_AMBULATORY_CARE_PROVIDER_SITE_OTHER): Payer: Medicare Other | Admitting: Family Medicine

## 2014-02-08 VITALS — BP 158/74 | HR 69 | Resp 16 | Wt 163.1 lb

## 2014-02-08 DIAGNOSIS — Z13820 Encounter for screening for osteoporosis: Secondary | ICD-10-CM

## 2014-02-08 DIAGNOSIS — F411 Generalized anxiety disorder: Secondary | ICD-10-CM

## 2014-02-08 DIAGNOSIS — E785 Hyperlipidemia, unspecified: Secondary | ICD-10-CM

## 2014-02-08 DIAGNOSIS — K219 Gastro-esophageal reflux disease without esophagitis: Secondary | ICD-10-CM

## 2014-02-08 DIAGNOSIS — G3184 Mild cognitive impairment, so stated: Secondary | ICD-10-CM

## 2014-02-08 DIAGNOSIS — I10 Essential (primary) hypertension: Secondary | ICD-10-CM

## 2014-02-08 DIAGNOSIS — R7301 Impaired fasting glucose: Secondary | ICD-10-CM

## 2014-02-08 MED ORDER — BUSPIRONE HCL 5 MG PO TABS: 5.0000 mg | ORAL_TABLET | Freq: Two times a day (BID) | ORAL | Status: DC

## 2014-02-08 MED ORDER — ISOSORBIDE MONONITRATE ER 30 MG PO TB24
30.0000 mg | ORAL_TABLET | Freq: Every day | ORAL | Status: DC
Start: 2014-02-08 — End: 2014-11-14

## 2014-02-08 MED ORDER — OMEPRAZOLE 20 MG PO CPDR: 20.0000 mg | DELAYED_RELEASE_CAPSULE | Freq: Every day | ORAL | Status: DC

## 2014-02-08 MED ORDER — TRIAMTERENE-HCTZ 37.5-25 MG PO TABS: 1.5000 | ORAL_TABLET | Freq: Every day | ORAL | Status: DC

## 2014-02-08 NOTE — Patient Instructions (Addendum)
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

## 2014-02-09 ENCOUNTER — Other Ambulatory Visit: Payer: Self-pay

## 2014-02-09 DIAGNOSIS — E785 Hyperlipidemia, unspecified: Secondary | ICD-10-CM

## 2014-02-09 MED ORDER — PRAVASTATIN SODIUM 40 MG PO TABS: 40.0000 mg | ORAL_TABLET | Freq: Every day | ORAL | Status: DC

## 2014-02-09 NOTE — Assessment & Plan Note (Signed)
Uncontrolled Hyperlipidemia:Low fat diet discussed and encouraged.  Inc dose of pravchol
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
## 2014-02-11 NOTE — Assessment & Plan Note (Signed)
Controlled, no change in medication  

## 2014-02-11 NOTE — Assessment & Plan Note (Addendum)
Ucontroled, not taking maxzide consistently as prescribed, varies the dose, denies adverse s/e , she is encouraged and advised to  take med as prescribed DASH diet and commitment to daily physical activity for a minimum of 30 minutes discussed and encouraged, as a part of hypertension management. The importance of attaining a healthy weight is also discussed. F/u in 5 to 6 weeks

## 2014-02-11 NOTE — Assessment & Plan Note (Signed)
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

## 2014-02-11 NOTE — Assessment & Plan Note (Signed)
Doing well mentally, pt primarily challenged by ongoing responsibilities of living some of which she should pass on to her children who she states are both willing, also the fact that her spouse has severe dementia is an added stress which she is handling as well as can be expected Has had medication in th e[past for memory loss but no indicatio that this is indicated at this time

## 2014-02-19 ENCOUNTER — Ambulatory Visit (HOSPITAL_COMMUNITY)
Admission: RE | Admit: 2014-02-19 | Discharge: 2014-02-19 | Disposition: A | Discharge status: Home / Self Care | Payer: Medicare Other | Source: Ambulatory Visit | Attending: Family Medicine | Admitting: Family Medicine

## 2014-02-19 ENCOUNTER — Encounter: Payer: Self-pay | Admitting: Gastroenterology

## 2014-02-19 DIAGNOSIS — Z13820 Encounter for screening for osteoporosis: Secondary | ICD-10-CM

## 2014-02-20 ENCOUNTER — Encounter: Payer: Self-pay | Admitting: Family Medicine

## 2014-02-20 DIAGNOSIS — M8580 Other specified disorders of bone density and structure, unspecified site: Secondary | ICD-10-CM | POA: Insufficient documentation

## 2014-03-19 ENCOUNTER — Other Ambulatory Visit: Payer: Self-pay | Admitting: Family Medicine

## 2014-04-03 ENCOUNTER — Other Ambulatory Visit: Payer: Self-pay | Admitting: Family Medicine

## 2014-04-03 ENCOUNTER — Encounter: Payer: Self-pay | Admitting: Family Medicine

## 2014-04-03 ENCOUNTER — Ambulatory Visit (INDEPENDENT_AMBULATORY_CARE_PROVIDER_SITE_OTHER): Payer: Medicare Other | Admitting: Family Medicine

## 2014-04-03 VITALS — BP 138/82 | HR 64 | Resp 18 | Wt 163.0 lb

## 2014-04-03 DIAGNOSIS — Z1231 Encounter for screening mammogram for malignant neoplasm of breast: Secondary | ICD-10-CM

## 2014-04-03 DIAGNOSIS — I10 Essential (primary) hypertension: Secondary | ICD-10-CM

## 2014-04-03 DIAGNOSIS — Z1321 Encounter for screening for nutritional disorder: Secondary | ICD-10-CM

## 2014-04-03 DIAGNOSIS — M1990 Unspecified osteoarthritis, unspecified site: Secondary | ICD-10-CM

## 2014-04-03 DIAGNOSIS — Z23 Encounter for immunization: Secondary | ICD-10-CM

## 2014-04-03 DIAGNOSIS — E785 Hyperlipidemia, unspecified: Secondary | ICD-10-CM

## 2014-04-03 NOTE — Patient Instructions (Signed)
Annual physical exam in October , call if you need me before  BP is excellent , no change in medication  Prevnar today   Fasting lipid, cmp and vit D in October before visit  Mammogram is due in July, pls schedule

## 2014-04-20 ENCOUNTER — Ambulatory Visit (HOSPITAL_COMMUNITY)
Admission: RE | Admit: 2014-04-20 | Discharge: 2014-04-20 | Disposition: A | Discharge status: Home / Self Care | Payer: Medicare Other | Source: Ambulatory Visit | Attending: Family Medicine | Admitting: Family Medicine

## 2014-04-20 DIAGNOSIS — Z1231 Encounter for screening mammogram for malignant neoplasm of breast: Secondary | ICD-10-CM

## 2014-05-07 ENCOUNTER — Other Ambulatory Visit: Payer: Self-pay | Admitting: Family Medicine

## 2014-05-14 DIAGNOSIS — Z23 Encounter for immunization: Secondary | ICD-10-CM | POA: Insufficient documentation

## 2014-05-14 NOTE — Assessment & Plan Note (Signed)
Hyperlipidemia:Low fat diet discussed and encouraged.  Updated lab needed at/ before next visit.  

## 2014-05-14 NOTE — Progress Notes (Signed)
   Subjective:    Patient ID: Candace Cervantes, female    DOB: 1925-08-12, 80 y.o.   MRN: [id number]  HPI Pt in for re evaluation of uncontrolled blood pressure. Denies any adverse  S/e related to medication. Feels well with no concerns or complaints   Review of Systems See HPI Denies recent fever or chills. Denies sinus pressure, nasal congestion, ear pain or sore throat. Denies chest congestion, productive cough or wheezing. Denies chest pains, palpitations, PND , orthopnea, light headedness and leg swelling        Objective:   Physical Exam BP 138/82  Pulse 64  Resp 18  Wt 163 lb (73.936 kg)  SpO2 93% Patient alert and oriented and in no cardiopulmonary distress.  HEENT: No facial asymmetry, EOMI,   oropharynx pink and moist.  Neck supple no JVD, no mass.  Chest: Clear to auscultation bilaterally.  CVS: S1, S2 no murmurs, no S3.Regular rate.  ABD: Soft non tender.   Ext: No edema    Psych: Good eye contact, normal affect. Memory intact not anxious or depressed appearing.  CNS: CN 2-12 intact, power,  normal throughout.no focal deficits noted.        Assessment & Plan:  HYPERTENSION Controlled, no change in medication DASH diet and commitment to daily physical activity for a minimum of 30 minutes discussed and encouraged, as a part of hypertension management. The importance of attaining a healthy weight is also discussed.   Need for vaccination with 13-polyvalent pneumococcal conjugate vaccine Vaccine administered at visit  HYPERLIPIDEMIA Hyperlipidemia:Low fat diet discussed and encouraged.  Updated lab needed at/ before next visit.

## 2014-05-14 NOTE — Assessment & Plan Note (Signed)
Vaccine administered at visit.  

## 2014-05-14 NOTE — Assessment & Plan Note (Signed)
Controlled, no change in medication DASH diet and commitment to daily physical activity for a minimum of 30 minutes discussed and encouraged, as a part of hypertension management. The importance of attaining a healthy weight is also discussed.  

## 2014-05-21 ENCOUNTER — Telehealth: Payer: Self-pay | Admitting: Family Medicine

## 2014-05-21 DIAGNOSIS — M6530 Trigger finger, unspecified finger: Secondary | ICD-10-CM

## 2014-05-21 NOTE — Telephone Encounter (Signed)
C/o trigger finger ring , needs to see ortho, stiff cannot move it for 3 weeks pls refer to Dr Luna Glasgow

## 2014-05-28 ENCOUNTER — Encounter (INDEPENDENT_AMBULATORY_CARE_PROVIDER_SITE_OTHER): Payer: Self-pay

## 2014-05-28 ENCOUNTER — Ambulatory Visit (INDEPENDENT_AMBULATORY_CARE_PROVIDER_SITE_OTHER): Payer: Medicare Other

## 2014-05-28 DIAGNOSIS — Z23 Encounter for immunization: Secondary | ICD-10-CM

## 2014-05-31 ENCOUNTER — Other Ambulatory Visit: Payer: Self-pay | Admitting: Family Medicine

## 2014-06-07 ENCOUNTER — Other Ambulatory Visit: Payer: Self-pay | Admitting: Family Medicine

## 2014-07-06 ENCOUNTER — Ambulatory Visit (INDEPENDENT_AMBULATORY_CARE_PROVIDER_SITE_OTHER): Payer: Medicare Other | Admitting: Internal Medicine

## 2014-07-06 ENCOUNTER — Encounter: Payer: Self-pay | Admitting: Internal Medicine

## 2014-07-06 VITALS — BP 125/67 | HR 65 | Ht 71.0 in | Wt 157.1 lb

## 2014-07-06 DIAGNOSIS — Z950 Presence of cardiac pacemaker: Secondary | ICD-10-CM

## 2014-07-06 DIAGNOSIS — I495 Sick sinus syndrome: Secondary | ICD-10-CM

## 2014-07-06 DIAGNOSIS — I10 Essential (primary) hypertension: Secondary | ICD-10-CM

## 2014-07-06 LAB — MDC_IDC_ENUM_SESS_TYPE_INCLINIC
Battery Impedance: 1800 Ohm
Brady Statistic RV Percent Paced: 11 %
Implantable Pulse Generator Model: 5816
Implantable Pulse Generator Serial Number: 1794510
Lead Channel Pacing Threshold Amplitude: 0.5 V
Lead Channel Pacing Threshold Amplitude: 0.75 V
Lead Channel Pacing Threshold Pulse Width: 0.5 ms
Lead Channel Sensing Intrinsic Amplitude: 1 mV
Lead Channel Sensing Intrinsic Amplitude: 5.8 mV
Lead Channel Setting Pacing Amplitude: 2 V
Lead Channel Setting Pacing Amplitude: 2.5 V
Lead Channel Setting Pacing Pulse Width: 0.5 ms
Lead Channel Setting Sensing Sensitivity: 2 mV
MDC IDC MSMT BATTERY VOLTAGE: 2.79 V
MDC IDC MSMT LEADCHNL RA IMPEDANCE VALUE: 421 Ohm
MDC IDC MSMT LEADCHNL RA PACING THRESHOLD PULSEWIDTH: 0.5 ms
MDC IDC MSMT LEADCHNL RV IMPEDANCE VALUE: 483 Ohm
MDC IDC SESS DTM: 20151002095016
MDC IDC STAT BRADY RA PERCENT PACED: 84 %

## 2014-07-06 LAB — PACEMAKER DEVICE OBSERVATION

## 2014-07-06 NOTE — Progress Notes (Signed)
HPI Candace Cervantes returns today for followup. She is a pleasant 80 yo woman with a h/o symptomatic bradycardia, s/p PPM, HTN, and CAD. In the interim, she has been stable. She admits to occaisional falls. No syncope or chest pain. No sob. No Known Allergies   Current Outpatient Prescriptions  Medication Sig Dispense Refill  . ALPRAZolam (XANAX) 0.25 MG tablet TAKE 1 TABLET BY MOUTH EVERY NIGHT AT BEDTIME AS NEEDED FOR ANXIETY  30 tablet  1  . aspirin 81 MG tablet Take 81 mg by mouth daily.      . busPIRone (BUSPAR) 5 MG tablet Take 1 tablet (5 mg total) by mouth 2 (two) times daily.  60 tablet  5  . Calcium Carbonate-Vitamin D (CALCIUM 600+D) 600-400 MG-UNIT per tablet Take 1 tablet by mouth daily.      . citalopram (CELEXA) 10 MG tablet TAKE 1 TABLET BY MOUTH EVERY DAY  30 tablet  3  . fenofibrate (TRICOR) 145 MG tablet TAKE 1 TABLET BY MOUTH EVERY DAY  30 tablet  2  . isosorbide mononitrate (IMDUR) 30 MG 24 hr tablet Take 1 tablet (30 mg total) by mouth daily.  30 tablet  5  . meclizine (ANTIVERT) 25 MG tablet TAKE 1 TABLET BY MOUTH TWICE DAILY AS NEEDED  40 tablet  0  . Multiple Vitamin (MULTIVITAMIN WITH MINERALS) TABS tablet Take 1 tablet by mouth daily.      . nitroGLYCERIN (NITROSTAT) 0.4 MG SL tablet Place 1 tablet (0.4 mg total) under the tongue every 5 (five) minutes as needed for chest pain. One tablet under the tongue at onset of chest pains, repeat every five minutes as needed  25 tablet  0  . omeprazole (PRILOSEC) 20 MG capsule Take 1 capsule (20 mg total) by mouth daily.  30 capsule  5  . potassium chloride 20 MEQ/15ML (10%) solution Take 15 mLs (20 mEq total) by mouth daily.  500 mL  4  . pravastatin (PRAVACHOL) 40 MG tablet Take 1 tablet (40 mg total) by mouth daily.  30 tablet  5  . triamterene-hydrochlorothiazide (MAXZIDE-25) 37.5-25 MG per tablet Take 1.5 tablets by mouth daily.  45 tablet  5   No current facility-administered medications for this visit.     Past Medical  History  Diagnosis Date  . Pacemaker 2002    Permanent Placement  . SSS (sick sinus syndrome)   . Pacemaker 2007    Generator Changed   . Hyperlipidemia   . Hypertension   . CAD (coronary artery disease) 2005    Nonobstructive 40% RCA lesion and normal ejection fraction at cath   . Gastroesophageal reflux disease   . Osteoporosis   . Anxiety disorder   . Osteoarthritis   . Sinoatrial node dysfunction   . Zenker diverticulum     previously evaluated by Dr. Erik Obey in [id number].     ROS:   All systems reviewed and negative except as noted in the HPI.   Past Surgical History  Procedure Laterality Date  . Total abdominal hysterectomy  2002  . Cholecystectomy    . Cystectomy      from back of the neck  . Pacemaker insertion  2002  . Pacemaker placement  2007    replacement   . Esophagogastroduodenoscopy  2008    Dr. Oneida Alar, difficulty passing scope through UES, incomplete fibrous ring at GEJ, hh, multiple benign gastri polyps  . Colonoscopy  2003    no polyps.  . Esophagogastroduodenoscopy N/A 10/17/2013  ZENKER'S, STRICTURE: 10-12.8 MM,  NSAID GASTRITIS FG POLYPS  . Savory dilation N/A 10/17/2013    Procedure: SAVORY DILATION;  Surgeon: Danie Binder, MD;  Location: [facility];  Service: Endoscopy;  Laterality: N/A;  with PEDS GASTROSCOPE  . Maloney dilation N/A 10/17/2013    Procedure: Venia Minks DILATION;  Surgeon: Danie Binder, MD;  Location: [facility];  Service: Endoscopy;  Laterality: N/A;  with PEDS GASTROSCOPE  . Esophagogastroduodenoscopy (egd) with esophageal dilation N/A 10/30/2013    Procedure: ESOPHAGOGASTRODUODENOSCOPY (EGD) WITH ESOPHAGEAL DILATION;  Surgeon: Danie Binder, MD;  Location: [facility];  Service: Endoscopy;  Laterality: N/A;  97     Family History  Problem Relation Age of Onset  . Lung disease Sister   . Cancer Brother     prostate  . Heart disease Mother   . Colon cancer Other     sibling, age 63     History   Social History   . Marital Status: Married    Spouse Name: N/A    Number of Children: 2  . Years of Education: N/A   Occupational History  . Not on file.   Social History Main Topics  . Smoking status: Never Smoker   . Smokeless tobacco: Not on file  . Alcohol Use: No  . Drug Use: No  . Sexual Activity: Not on file   Other Topics Concern  . Not on file   Social History Narrative  . No narrative on file     BP 125/67  Pulse 65  Ht 5\' 11"  (1.803 m)  Wt 157 lb 1.9 oz (71.269 kg)  BMI 21.92 kg/m2  Physical Exam:  Well appearing 80 yo woman, NAD HEENT: Unremarkable Neck:  7 cm JVD, no thyromegally Back:  No CVA tenderness Lungs:  Clear with no wheezes, rales, or rhonchi HEART:  Regular rate rhythm, no murmurs, no rubs, no clicks Abd:  soft, positive bowel sounds, no organomegally, no rebound, no guarding Ext:  2 plus pulses, no edema, no cyanosis, no clubbing Skin:  No rashes no nodules Neuro:  CN II through XII intact, motor grossly intact   DEVICE  Normal device function.  See PaceArt for details.   Assess/Plan:

## 2014-07-06 NOTE — Assessment & Plan Note (Signed)
Her [employer] DDD PM is working normally. Will recheck in several months.  

## 2014-07-06 NOTE — Patient Instructions (Addendum)
Your physician wants you to follow-up in: 6 months with Paula and 1 year with Dr. Taylor  You will receive a reminder letter in the mail two months in advance. If you don't receive a letter, please call our office to schedule the follow-up appointment.  Your physician recommends that you continue on your current medications as directed. Please refer to the Current Medication list given to you today.  Thank you for choosing [facility]!!    

## 2014-07-06 NOTE — Assessment & Plan Note (Signed)
Her blood pressure is well controlled. No change in meds. 

## 2014-08-06 ENCOUNTER — Other Ambulatory Visit: Payer: Self-pay | Admitting: Family Medicine

## 2014-08-06 ENCOUNTER — Encounter: Payer: Self-pay | Admitting: Gastroenterology

## 2014-08-06 ENCOUNTER — Other Ambulatory Visit (HOSPITAL_COMMUNITY)
Admission: RE | Admit: 2014-08-06 | Discharge: 2014-08-06 | Disposition: A | Discharge status: Home / Self Care | Payer: Medicare Other | Source: Ambulatory Visit | Attending: Family Medicine | Admitting: Family Medicine

## 2014-08-06 ENCOUNTER — Ambulatory Visit (INDEPENDENT_AMBULATORY_CARE_PROVIDER_SITE_OTHER): Payer: Medicare Other | Admitting: Family Medicine

## 2014-08-06 ENCOUNTER — Encounter (INDEPENDENT_AMBULATORY_CARE_PROVIDER_SITE_OTHER): Payer: Self-pay

## 2014-08-06 VITALS — BP 136/78 | HR 72 | Resp 18 | Ht 73.0 in | Wt 151.0 lb

## 2014-08-06 DIAGNOSIS — D6489 Other specified anemias: Secondary | ICD-10-CM

## 2014-08-06 DIAGNOSIS — Z0001 Encounter for general adult medical examination with abnormal findings: Secondary | ICD-10-CM | POA: Insufficient documentation

## 2014-08-06 DIAGNOSIS — Z124 Encounter for screening for malignant neoplasm of cervix: Secondary | ICD-10-CM | POA: Insufficient documentation

## 2014-08-06 DIAGNOSIS — E785 Hyperlipidemia, unspecified: Secondary | ICD-10-CM

## 2014-08-06 DIAGNOSIS — I10 Essential (primary) hypertension: Secondary | ICD-10-CM

## 2014-08-06 DIAGNOSIS — Z1211 Encounter for screening for malignant neoplasm of colon: Secondary | ICD-10-CM

## 2014-08-06 DIAGNOSIS — Z0000 Encounter for general adult medical examination without abnormal findings: Secondary | ICD-10-CM

## 2014-08-06 DIAGNOSIS — Z01419 Encounter for gynecological examination (general) (routine) without abnormal findings: Secondary | ICD-10-CM

## 2014-08-06 DIAGNOSIS — R194 Change in bowel habit: Secondary | ICD-10-CM

## 2014-08-06 DIAGNOSIS — R7302 Impaired glucose tolerance (oral): Secondary | ICD-10-CM

## 2014-08-06 DIAGNOSIS — D638 Anemia in other chronic diseases classified elsewhere: Secondary | ICD-10-CM

## 2014-08-06 DIAGNOSIS — R634 Abnormal weight loss: Secondary | ICD-10-CM

## 2014-08-06 LAB — HEMOCCULT GUIAC POC 1CARD (OFFICE): Fecal Occult Blood, POC: NEGATIVE

## 2014-08-06 NOTE — Patient Instructions (Addendum)
Annual wellness in 2.5 month, call if you need me before  I am concerned about your weight loss, also the lower abdominal discomfort and change in bowel pattern that you report in the past 6 months.  Labs today, we will call with results.Lipid, cmp and EGFr, HBA1C and CBC  I am going to request that you see your stomach specialist  Also  Stool today has no hidden blood , which is good

## 2014-08-07 LAB — COMPLETE METABOLIC PANEL WITH GFR
ALBUMIN: 3.9 g/dL (ref 3.5–5.2)
ALK PHOS: 35 U/L — AB (ref 39–117)
ALT: 12 U/L (ref 0–35)
AST: 20 U/L (ref 0–37)
BILIRUBIN TOTAL: 0.3 mg/dL (ref 0.2–1.2)
BUN: 19 mg/dL (ref 6–23)
CO2: 28 mEq/L (ref 19–32)
CREATININE: 1.22 mg/dL — AB (ref 0.50–1.10)
Calcium: 9.7 mg/dL (ref 8.4–10.5)
Chloride: 99 mEq/L (ref 96–112)
GFR, EST NON AFRICAN AMERICAN: 40 mL/min — AB
GFR, Est African American: 46 mL/min — ABNORMAL LOW
Glucose, Bld: 83 mg/dL (ref 70–99)
POTASSIUM: 3.5 meq/L (ref 3.5–5.3)
Sodium: 137 mEq/L (ref 135–145)
Total Protein: 6.9 g/dL (ref 6.0–8.3)

## 2014-08-07 LAB — LIPID PANEL
Cholesterol: 151 mg/dL (ref 0–200)
HDL: 69 mg/dL (ref >39)
LDL CALC: 72 mg/dL (ref 0–99)
Total CHOL/HDL Ratio: 2.2 Ratio
Triglycerides: 51 mg/dL (ref <150)
VLDL: 10 mg/dL (ref 0–40)

## 2014-08-07 LAB — CBC
HEMATOCRIT: 31.7 % — AB (ref 36.0–46.0)
Hemoglobin: 10.8 g/dL — ABNORMAL LOW (ref 12.0–15.0)
MCH: 30.3 pg (ref 26.0–34.0)
MCHC: 34.1 g/dL (ref 30.0–36.0)
MCV: 89 fL (ref 78.0–100.0)
Platelets: 247 10*3/uL (ref 150–400)
RBC: 3.56 MIL/uL — AB (ref 3.87–5.11)
RDW: 14.2 % (ref 11.5–15.5)
WBC: 5 10*3/uL (ref 4.0–10.5)

## 2014-08-07 LAB — IRON: Iron: 60 ug/dL (ref 42–145)

## 2014-08-07 LAB — CYTOLOGY - PAP

## 2014-08-07 LAB — HEMOGLOBIN A1C
HEMOGLOBIN A1C: 6.1 % — AB (ref <5.7)
MEAN PLASMA GLUCOSE: 128 mg/dL — AB (ref <117)

## 2014-08-07 LAB — FERRITIN: Ferritin: 149 ng/mL (ref 10–291)

## 2014-08-15 ENCOUNTER — Other Ambulatory Visit: Payer: Self-pay | Admitting: Family Medicine

## 2014-08-16 ENCOUNTER — Ambulatory Visit: Payer: Medicare Other | Admitting: Gastroenterology

## 2014-08-21 ENCOUNTER — Other Ambulatory Visit: Payer: Self-pay | Admitting: Family Medicine

## 2014-09-12 ENCOUNTER — Ambulatory Visit: Payer: Medicare Other | Admitting: Gastroenterology

## 2014-09-17 ENCOUNTER — Other Ambulatory Visit: Payer: Self-pay | Admitting: Family Medicine

## 2014-09-27 ENCOUNTER — Encounter: Payer: Self-pay | Admitting: Family Medicine

## 2014-09-27 DIAGNOSIS — R7302 Impaired glucose tolerance (oral): Secondary | ICD-10-CM | POA: Insufficient documentation

## 2014-09-27 DIAGNOSIS — Z1211 Encounter for screening for malignant neoplasm of colon: Secondary | ICD-10-CM | POA: Insufficient documentation

## 2014-09-27 NOTE — Assessment & Plan Note (Signed)
Annual exam as documented. Counseling done  re healthy lifestyle involving commitment to regular physical activity, heart healthy diet, and improved nutrition.The importance of adequate sleep also discussed. Regular seat belt use and home safety, is also discussed.  Immunization and cancer screening needs are specifically addressed at this visit.

## 2014-09-27 NOTE — Assessment & Plan Note (Signed)
No mass on exam and heme negative stool

## 2014-09-27 NOTE — Progress Notes (Signed)
   Subjective:    Patient ID: Candace Cervantes, female    DOB: 19-Nov-1924, 80 y.o.   MRN: [id number]  HPI Patient is in for annual physical exam. She reports chang in bowel movements over past several months, and has lost 12 pounds in the past 11 months, unintentionally. She does have a lot of emotional and physical stress caring for her spouse who has dementia, and she is becoming increasingly challenged  Physically and emotionally. Denies abdominal pain, nausea or emesis  Review of Systems See HPI     Objective:   Physical Exam  BP 136/78 mmHg  Pulse 72  Resp 18  Ht 6\' 1"  (1.854 m)  Wt 151 lb (68.493 kg)  BMI 19.93 kg/m2  SpO2 97%   Pleasant elderly female, alert and oriented x 3, in no cardio-pulmonary distress. Afebrile. HEENT No facial trauma or asymetry. Sinuses non tender.  Extra occullar muscles intact, pupils equally reactive to light. External ears normal, tympanic membranes clear. Oropharynx moist, no exudate, dentures. Neck: decreased though adequate ROM, no adenopathy,JVD or thyromegaly.No bruits.  Chest: Clear to ascultation bilaterally.No crackles or wheezes. Non tender to palpation  Breast: No asymetry,no masses or lumps. No tenderness. No nipple discharge or inversion. No axillary or supraclavicular adenopathy  Cardiovascular system; Heart sounds normal,  S1 and  S2 ,no S3.  No murmur, or thrill. Apical beat not displaced Peripheral pulses normal.  Abdomen: Soft, non tender, no organomegaly or masses. No bruits. Bowel sounds normal. No guarding, tenderness or rebound.  Rectal:  Normal sphincter tone. No mass.No rectal masses.  Guaiac negative stool.  GU: External genitalia normal female genitalia , female distribution of hair. No lesions. Urethral meatus normal in size, no  Prolapse, no lesions visibly  Present. Bladder non tender. Vagina pink and moist , with no visible lesions , scant physiologic discharge present . Adequate pelvic  support no  cystocele or rectocele noted  Uterus absent, no adnexal masses, no  adnexal tenderness.   Musculoskeletal exam: Decreased though adequate  ROM of spine, hips , shoulders and knees. Mild  deformity ,and  crepitus noted. No muscle wasting or atrophy.   Neurologic: Cranial nerves 2 to 12 intact. Power, tone ,sensation and reflexes normal throughout. No disturbance in gait. No tremor.  Skin: Intact, no ulceration, erythema , scaling or rash noted. Pigmentation normal throughout  Psych; Normal mood and affect. Judgement and concentration normal       Assessment & Plan:  Annual physical exam Annual exam as documented. Counseling done  re healthy lifestyle involving commitment to regular physical activity, heart healthy diet, and improved nutrition.The importance of adequate sleep also discussed. Regular seat belt use and home safety, is also discussed.  Immunization and cancer screening needs are specifically addressed at this visit.   Special screening for malignant neoplasms, colon No mass on exam and heme negative stool  Unintended weight loss Twelve pound weight loss in the past 12 months, reports reduced appetite, she does have significant stress caring fo her spouse who has severe dementia. Reassuring that stool is heme negative, however She does have anemia and reports recent change in bowel habits.She is referred to GI for further eval Will f/u on labs

## 2014-09-27 NOTE — Assessment & Plan Note (Addendum)
Twelve pound weight loss in the past 12 months, reports reduced appetite, she does have significant stress caring fo her spouse who has severe dementia. Reassuring that stool is heme negative, however She does have anemia and reports recent change in bowel habits.She is referred to GI for further eval Will f/u on labs

## 2014-10-08 ENCOUNTER — Other Ambulatory Visit: Payer: Self-pay | Admitting: Family Medicine

## 2014-10-19 ENCOUNTER — Ambulatory Visit (INDEPENDENT_AMBULATORY_CARE_PROVIDER_SITE_OTHER): Payer: Medicare Other | Admitting: Nurse Practitioner

## 2014-10-19 ENCOUNTER — Encounter: Payer: Self-pay | Admitting: Nurse Practitioner

## 2014-10-19 VITALS — BP 113/57 | HR 84 | Temp 97.4°F | Ht 71.0 in | Wt 156.6 lb

## 2014-10-19 DIAGNOSIS — D649 Anemia, unspecified: Secondary | ICD-10-CM | POA: Diagnosis not present

## 2014-10-19 DIAGNOSIS — R634 Abnormal weight loss: Secondary | ICD-10-CM | POA: Diagnosis not present

## 2014-10-19 DIAGNOSIS — K5900 Constipation, unspecified: Secondary | ICD-10-CM

## 2014-10-19 LAB — CBC
HCT: 34.3 % — ABNORMAL LOW (ref 36.0–46.0)
Hemoglobin: 11.7 g/dL — ABNORMAL LOW (ref 12.0–15.0)
MCH: 30.5 pg (ref 26.0–34.0)
MCHC: 34.1 g/dL (ref 30.0–36.0)
MCV: 89.6 fL (ref 78.0–100.0)
MPV: 9.8 fL (ref 8.6–12.4)
Platelets: 215 10*3/uL (ref 150–400)
RBC: 3.83 MIL/uL — AB (ref 3.87–5.11)
RDW: 14.1 % (ref 11.5–15.5)
WBC: 4.9 10*3/uL (ref 4.0–10.5)

## 2014-10-19 NOTE — Patient Instructions (Addendum)
1. We added a probiotic once a day for one month 2. Increase the fiber in your diet 3. If you have any changes (blood in your stool, abdominal pain, worsening weight loss, etc) call and let us know. 4. Have your blood work checked at your convenience.

## 2014-10-19 NOTE — Assessment & Plan Note (Signed)
Bowel movement every 3 days with occasional straining. Suggest increased fiber and water in her diet for increased bulk and easy passage and promote complete emptying as well. Also suggested adding an over the counter probiotic. If any changes, patient encouraged to follow-up with [place].

## 2014-10-19 NOTE — Assessment & Plan Note (Signed)
Recent unintentional weight loss of 12lb in 12 months. 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

## 2014-10-19 NOTE — Progress Notes (Signed)
Referring Provider: Fayrene Helper, MD Primary Care Physician:  [name], MD  Primary GI: Dr. Oneida Alar  Chief Complaint  Patient presents with  . Anemia    HPI:   81 year old female referred back to [place] for unintentional weight loss of 12 pounds in 12 months, anemia, and change in bowel habits. Heme- stool at annual physical at that time. H/H 10.8/31.7 with baseline from 2-3 years ago 11.8-12.5/34.8-37.0.   Patient is a poor historian. Today she states she has lost about 12 pounds in the past year but thinks it's related to stress as she is primary caregiver to her husband with worsening dementia. Denies overt abdominal discomfort. Typically has a bowel movement every 3 days which has been her normal for many years. Then admits her bowel movements are changed and softer and "they float" which has been occurring for the past couple months. Admits occasional straining with bowel movements and consistency is usually a 3 on the Bristol stool scale. Denies hematochezia or melena. Denies N/V. Admits solid food dysphagia if food is dry or hard, soft foods with no issues. Admits pill dysphagia but usually crushes them up with apple sauce with no issues. No problems with liquids.  Last EGD 10/2013 showed Zenker's diverticulum with large opening at the cricophayngeus, stricture at the cricopharyngeus, innumerable fundic gland polyps, and mild NSAID gastritis. On Prilosec 20mg  daily which controls her symptoms pretty well. Appetite is good. Denies any other upper or lower GI symptoms.  Past Medical History  Diagnosis Date  . Pacemaker 2002    Permanent Placement  . SSS (sick sinus syndrome)   . Pacemaker 2007    Generator Changed   . Hyperlipidemia   . Hypertension   . CAD (coronary artery disease) 2005    Nonobstructive 40% RCA lesion and normal ejection fraction at cath   . Gastroesophageal reflux disease   . Osteoporosis   . Anxiety disorder   . Osteoarthritis   . Sinoatrial node  dysfunction   . Zenker diverticulum     previously evaluated by Dr. Erik Obey in [id number].     Past Surgical History  Procedure Laterality Date  . Total abdominal hysterectomy  2002  . Cholecystectomy    . Cystectomy      from back of the neck  . Pacemaker insertion  2002  . Pacemaker placement  2007    replacement   . Esophagogastroduodenoscopy  2008    Dr. Oneida Alar, difficulty passing scope through UES, incomplete fibrous ring at GEJ, hh, multiple benign gastri polyps  . Colonoscopy  2003    no polyps.  . Esophagogastroduodenoscopy N/A 10/17/2013    ZENKER'S, STRICTURE: 10-12.8 MM,  NSAID GASTRITIS FG POLYPS  . Savory dilation N/A 10/17/2013    Procedure: SAVORY DILATION;  Surgeon: Danie Binder, MD;  Location: [facility];  Service: Endoscopy;  Laterality: N/A;  with PEDS GASTROSCOPE  . Maloney dilation N/A 10/17/2013    Procedure: Venia Minks DILATION;  Surgeon: Danie Binder, MD;  Location: [facility];  Service: Endoscopy;  Laterality: N/A;  with PEDS GASTROSCOPE  . Esophagogastroduodenoscopy (egd) with esophageal dilation N/A 10/30/2013    Procedure: ESOPHAGOGASTRODUODENOSCOPY (EGD) WITH ESOPHAGEAL DILATION;  Surgeon: Danie Binder, MD;  Location: [facility];  Service: Endoscopy;  Laterality: N/A;  830    Current Outpatient Prescriptions  Medication Sig Dispense Refill  . ALPRAZolam (XANAX) 0.25 MG tablet TAKE 1 TABLET BY MOUTH EVERY NIGHT AT BEDTIME AS NEEDED FOR ANXIETY 30  tablet 1  . aspirin 81 MG tablet Take 81 mg by mouth daily.    . busPIRone (BUSPAR) 5 MG tablet Take 1 tablet (5 mg total) by mouth 2 (two) times daily. 60 tablet 5  . Calcium Carbonate-Vitamin D (CALCIUM 600+D) 600-400 MG-UNIT per tablet Take 1 tablet by mouth daily.    . citalopram (CELEXA) 10 MG tablet TAKE 1 TABLET BY MOUTH EVERY DAY 30 tablet 3  . fenofibrate (TRICOR) 145 MG tablet TAKE 1 TABLET BY MOUTH EVERY DAY 30 tablet 2  . isosorbide mononitrate (IMDUR) 30 MG 24 hr tablet Take 1 tablet (30 mg  total) by mouth daily. 30 tablet 5  . meclizine (ANTIVERT) 25 MG tablet TAKE 1 TABLET BY MOUTH TWICE DAILY AS NEEDED. 40 tablet 0  . Multiple Vitamin (MULTIVITAMIN WITH MINERALS) TABS tablet Take 1 tablet by mouth daily.    . nitroGLYCERIN (NITROSTAT) 0.4 MG SL tablet Place 1 tablet (0.4 mg total) under the tongue every 5 (five) minutes as needed for chest pain. One tablet under the tongue at onset of chest pains, repeat every five minutes as needed 25 tablet 0  . omeprazole (PRILOSEC) 20 MG capsule TAKE 1 CAPSULE BY MOUTH DAILY 30 capsule 3  . potassium chloride 20 MEQ/15ML (10%) SOLN TAKE 15ML BY MOUTH EVERY DAY 500 mL 0  . pravastatin (PRAVACHOL) 40 MG tablet Take 1 tablet (40 mg total) by mouth daily. 30 tablet 5  . triamterene-hydrochlorothiazide (MAXZIDE-25) 37.5-25 MG per tablet TAKE ONE AND ONE-HALF TABLETS BY MOUTH DAILY 45 tablet 0   No current facility-administered medications for this visit.    Allergies as of 10/19/2014  . (No Known Allergies)    Family History  Problem Relation Age of Onset  . Lung disease Sister   . Cancer Brother     prostate  . Heart disease Mother   . Colon cancer Other     sibling, age 50    History   Social History  . Marital Status: Married    Spouse Name: N/A    Number of Children: 2  . Years of Education: N/A   Social History Main Topics  . Smoking status: Never Smoker   . Smokeless tobacco: None  . Alcohol Use: No  . Drug Use: No  . Sexual Activity: None   Other Topics Concern  . None   Social History Narrative    Review of Systems: Gen: Denies fever, chills, anorexia. Denies fatigue, weakness.  CV: Denies chest pain, palpitations, syncope, peripheral edema, and claudication. Resp: Denies dyspnea at rest, cough, wheezing, coughing up blood, and pleurisy. GI: See HPI. Derm: Denies rash, itching, dry skin Psych: Denies depression, anxiety, memory loss, confusion.  Heme: Denies bruising, bleeding, and enlarged lymph  nodes.  Physical Exam: BP 113/57 mmHg  Pulse 84  Temp(Src) 97.4 F (36.3 C) (Oral)  Ht 5\' 11"  (1.803 m)  Wt 156 lb 9.6 oz (71.033 kg)  BMI 21.85 kg/m2  General:   Alert and oriented. No distress noted. Pleasant and cooperative.  Head:  Normocephalic and atraumatic. Eyes:  Conjuctiva clear without scleral icterus. Mouth:  Oral mucosa pink and moist. Good dentition. No lesions. Neck:  Supple, without mass or thyromegaly. Heart:  S1, S2 present without murmurs, rubs, or gallops. Regular rate and rhythm. Abdomen:  +BS, soft, non-tender and non-distended. No rebound or guarding. No HSM or masses noted. Msk:  Symmetrical without gross deformities. Normal posture. Pulses:  2+ DP noted bilaterally Extremities:  Without edema. Neurologic:  Alert and  oriented x4;  grossly normal neurologically. Skin:  Intact without significant lesions or rashes. Cervical Nodes:  No significant cervical adenopathy. Psych:  Alert and cooperative. Normal mood and affect.    10/19/2014 10:53 AM

## 2014-10-19 NOTE — Assessment & Plan Note (Signed)
Patient with mild anemia with normal Fe and ferratin recently. Her current H/H is within baseline for the past two years, but a bit lower when compared to 3+ years ago. Will recheck her CBC with ferratin and add TIBC as well to check for iron deficiency anema. No warning or reg flag signs to suggest occult GI bleed. Patient instructed if any change such as abdominal pain or GI bleed to please follow-up with [place] for further evaluation.

## 2014-10-20 LAB — IRON AND TIBC
%SAT: 9 % — AB (ref 20–55)
Iron: 31 ug/dL — ABNORMAL LOW (ref 42–145)
TIBC: 329 ug/dL (ref 250–470)
UIBC: 298 ug/dL (ref 125–400)

## 2014-10-20 LAB — FERRITIN: Ferritin: 144 ng/mL (ref 10–291)

## 2014-10-22 ENCOUNTER — Other Ambulatory Visit: Payer: Self-pay | Admitting: Family Medicine

## 2014-10-22 NOTE — Progress Notes (Signed)
cc'ed to pcp °

## 2014-10-31 ENCOUNTER — Other Ambulatory Visit: Payer: Self-pay

## 2014-10-31 ENCOUNTER — Other Ambulatory Visit: Payer: Self-pay | Admitting: Family Medicine

## 2014-10-31 MED ORDER — ALPRAZOLAM 0.25 MG PO TABS: ORAL_TABLET | ORAL | Status: DC

## 2014-11-14 ENCOUNTER — Ambulatory Visit (INDEPENDENT_AMBULATORY_CARE_PROVIDER_SITE_OTHER): Payer: Medicare Other | Admitting: Family Medicine

## 2014-11-14 ENCOUNTER — Encounter: Payer: Self-pay | Admitting: Family Medicine

## 2014-11-14 VITALS — BP 122/72 | HR 60 | Resp 18 | Ht 73.0 in | Wt 157.0 lb

## 2014-11-14 DIAGNOSIS — Z1211 Encounter for screening for malignant neoplasm of colon: Secondary | ICD-10-CM

## 2014-11-14 DIAGNOSIS — E785 Hyperlipidemia, unspecified: Secondary | ICD-10-CM

## 2014-11-14 DIAGNOSIS — Z0000 Encounter for general adult medical examination without abnormal findings: Secondary | ICD-10-CM | POA: Insufficient documentation

## 2014-11-14 DIAGNOSIS — D649 Anemia, unspecified: Secondary | ICD-10-CM

## 2014-11-14 DIAGNOSIS — I10 Essential (primary) hypertension: Secondary | ICD-10-CM

## 2014-11-14 NOTE — Patient Instructions (Addendum)
F/u in 4 month, call if you need me befoore  Continue to be careful about falls, and to involve your children as much as able so you get the help that you need  If you want to get hearing checked , call and let me know  Iron is low, start slow iron 65 mg one daily, this is OTC  CbC , irona ferritin, fasting lipid, cmp and TSH in 4 month  Fall Prevention and Home Safety Falls cause injuries and can affect all age groups. It is possible to prevent falls.  HOW TO PREVENT FALLS  Wear shoes with rubber soles that do not have an opening for your toes.  Keep the inside and outside of your house well lit.  Use night lights throughout your home.  Remove clutter from floors.  Clean up floor spills.  Remove throw rugs or fasten them to the floor with carpet tape.  Do not place electrical cords across pathways.  Put grab bars by your tub, shower, and toilet. Do not use towel bars as grab bars.  Put handrails on both sides of the stairway. Fix loose handrails.  Do not climb on stools or stepladders, if possible.  Do not wax your floors.  Repair uneven or unsafe sidewalks, walkways, or stairs.  Keep items you use a lot within reach.  Be aware of pets.  Keep emergency numbers next to the telephone.  Put smoke detectors in your home and near bedrooms. Ask your doctor what other things you can do to prevent falls. Document Released: 07/18/2009 Document Revised: 03/22/2012 Document Reviewed: 12/22/2011 [facility] Patient Information 2015 [place], [place]. This information is not intended to replace advice given to you by your health care provider. Make sure you discuss any questions you have with your health care provider.

## 2014-11-14 NOTE — Assessment & Plan Note (Signed)
Annual exam as documented. Counseling done  re healthy lifestyle involving commitment to 150 minutes exercise per week, heart healthy diet, and attaining healthy weight.The importance of adequate sleep also discussed. Regular seat belt use and home safety, is also discussed. Changes in health habits are decided on. Inmmuniization   And end of life issues are specifically addressed He r 2 daughter sre closely involved, and she states they are aware of the challenge she has caring for her spouse with worsening dementia. She is a ful code, she has not named a HPOA and I du iscussed trying to do this as her spuse is incapable of making her health care decisisons in the event that she is bale  Living will material provided and briefly discussed

## 2014-11-14 NOTE — Progress Notes (Signed)
Subjective:    Patient ID: Candace Cervantes, female    DOB: [birth year], 81 y.o.   MRN: [id number]  HPI  Preventive Screening-Counseling & Management   Patient present here today for  subsequentMedicare annual wellness visit.   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 No  Over the past two weeks, have you felt little interest or pleasure in doing things? No  Have you lost interest or pleasure in daily life? No  Do you often feel hopeless? No  Do you cry easily over simple problems? Occasionally    Activities of Daily Living  In your present state of health, do you have any difficulty performing the following activities?  Driving?: No Managing money?: No Feeding yourself?:No Getting from bed to chair?:No Climbing a flight of stairs?: Limited Preparing food and eating?:No Bathing or showering?:No Getting dressed?:No Getting to the toilet?:No Using the toilet?:No Moving around from place to place?: No  Fall Risk Assessment In the past year have you fallen or had a near fall?:No Are you currently taking any medications that make you dizzy:No   Hearing Difficulties: No Do you often ask people to speak up or repeat themselves?:sometimes Do you experience ringing or noises in your ears?:sometimes Do you have difficulty understanding soft or whispered voices?:yes  Cognitive Testing  Alert? Yes Normal Appearance?Yes  Oriented to person? Yes Place? Yes  Time? Yes  Displays appropriate  judgment?Yes  Can read the correct time from a watch face? yes Are you having problems remembering things? Yes increase in short term memory problems   Advanced Directives have been discussed with the patient?Yes and brochure provided    List the Names of Other Physician/Practitioners you currently use: updated    Indicate any recent Medical Services you may have received from other than [facility] providers in the past year (date may be approximate).   Assessment:    Annual Wellness Exam   Plan:    Patient Instructions (the written plan) was given to the patient.  Medicare Attestation  I have personally reviewed:  The patient's medical and social history  Their use of alcohol, tobacco or illicit drugs  Their current medications and supplements  The patient's functional ability including ADLs,fall risks, home safety risks, cognitive, and hearing and visual impairment  Diet and physical activities  Evidence for depression or mood disorders  The patient's weight, height, BMI, and visual acuity have been recorded in the chart. I have made referrals, counseling, and provided education to the patient based on review of the above and I have provided the patient with a written personalized care plan for preventive services.     Review of Systems     Objective:   Physical Exam  BP 122/72 mmHg  Pulse 60  Resp 18  Ht 6\' 1"  (1.854 m)  Wt 157 lb (71.215 kg)  BMI 20.72 kg/m2  SpO2 94%       Assessment & Plan:  Medicare annual wellness visit, subsequent Annual exam as documented.  Counseling done  re healthy lifestyle involving commitment to 150 minutes exercise per week, heart healthy diet, and attaining healthy weight.The importance of adequate sleep also discussed. Regular seat belt use and home safety, is also discussed. Changes in health habits are decided on. Inmmuniization   And end of life issues are specifically addressed He r 2 daughter sre closely involved, and she states  they are aware of the challenge she has caring for her spouse with worsening dementia. She is a ful code, she has not named a HPOA and I du iscussed trying to do this as her spuse is incapable of making her health care decisisons in the event that she is bale  Living will material provided and briefly discussed

## 2014-11-15 DIAGNOSIS — H04123 Dry eye syndrome of bilateral lacrimal glands: Secondary | ICD-10-CM | POA: Diagnosis not present

## 2014-11-15 DIAGNOSIS — Z961 Presence of intraocular lens: Secondary | ICD-10-CM | POA: Diagnosis not present

## 2014-11-15 DIAGNOSIS — H40013 Open angle with borderline findings, low risk, bilateral: Secondary | ICD-10-CM | POA: Diagnosis not present

## 2014-11-15 DIAGNOSIS — H02834 Dermatochalasis of left upper eyelid: Secondary | ICD-10-CM | POA: Diagnosis not present

## 2014-11-15 DIAGNOSIS — H02831 Dermatochalasis of right upper eyelid: Secondary | ICD-10-CM | POA: Diagnosis not present

## 2014-12-10 ENCOUNTER — Other Ambulatory Visit: Payer: Self-pay

## 2014-12-10 MED ORDER — MECLIZINE HCL 25 MG PO TABS: 25.0000 mg | ORAL_TABLET | Freq: Two times a day (BID) | ORAL | Status: DC | PRN

## 2014-12-26 ENCOUNTER — Other Ambulatory Visit: Payer: Self-pay | Admitting: Family Medicine

## 2014-12-27 ENCOUNTER — Other Ambulatory Visit: Payer: Self-pay | Admitting: Family Medicine

## 2014-12-31 ENCOUNTER — Other Ambulatory Visit: Payer: Self-pay

## 2014-12-31 MED ORDER — NITROGLYCERIN 0.4 MG SL SUBL: 0.4000 mg | SUBLINGUAL_TABLET | SUBLINGUAL | Status: DC | PRN

## 2015-01-16 NOTE — Progress Notes (Signed)
REVIEWED-NO ADDITIONAL RECOMMENDATIONS. 

## 2015-01-17 ENCOUNTER — Ambulatory Visit (INDEPENDENT_AMBULATORY_CARE_PROVIDER_SITE_OTHER): Payer: Medicare Other | Admitting: Nurse Practitioner

## 2015-01-17 ENCOUNTER — Encounter: Payer: Self-pay | Admitting: Nurse Practitioner

## 2015-01-17 ENCOUNTER — Other Ambulatory Visit: Payer: Self-pay

## 2015-01-17 VITALS — BP 131/75 | HR 60 | Temp 97.4°F | Ht 71.0 in | Wt 156.4 lb

## 2015-01-17 DIAGNOSIS — K5900 Constipation, unspecified: Secondary | ICD-10-CM | POA: Diagnosis not present

## 2015-01-17 DIAGNOSIS — R1314 Dysphagia, pharyngoesophageal phase: Secondary | ICD-10-CM

## 2015-01-17 DIAGNOSIS — R1319 Other dysphagia: Secondary | ICD-10-CM

## 2015-01-17 DIAGNOSIS — R634 Abnormal weight loss: Secondary | ICD-10-CM

## 2015-01-17 DIAGNOSIS — K225 Diverticulum of esophagus, acquired: Secondary | ICD-10-CM | POA: Diagnosis not present

## 2015-01-17 DIAGNOSIS — R1310 Dysphagia, unspecified: Secondary | ICD-10-CM

## 2015-01-17 NOTE — Assessment & Plan Note (Signed)
History of Zenker's diverticulum which on last endoscopic examination and last barium esophagram was stable in size. Patient currently having dysphagia symptoms and this could be a component of her symptoms. However last time she was having worsening dysphagia symptoms she was also noted to have a wet development around the diverticulum. Today we will order a barium pill esophagram to further evaluate her dysphagia symptoms as well as status of her diverticulum. Patient results of this procedure she may require further endoscopic management including EGD with dilation. Follow-up in 6-8 weeks, or sooner if needed.

## 2015-01-17 NOTE — Patient Instructions (Addendum)
1. We will schedule your imaging test for you 2. Follow-up in 6-8 weeks or sooner if needed.

## 2015-01-17 NOTE — Progress Notes (Signed)
cc'ed to pcp °

## 2015-01-17 NOTE — Assessment & Plan Note (Signed)
Follow-up on constipation symptoms patient says is much improved since starting the probiotic and increasing the fiber in her diet. Had 1 episode of minor abdominal pain last week for approximately 1 day which self resolved. Denies nausea and vomiting. Has having more frequent bowel movements and at most will go 1-2 days in between bowel movements. Advised to continue the probiotic as well as continue increased fiber in her diet. Follow-up as needed for these symptoms.

## 2015-01-17 NOTE — Progress Notes (Signed)
Referring Provider: Fayrene Helper, MD Primary Care Physician:  [name], MD Primary GI: Dr. Oneida Alar  Chief Complaint  Patient presents with  . Follow-up    HPI:   81 year old female presents for follow-up on constipation, anemia, and unintentional weight loss. At her last visit she described unintentional weight loss of 12 pounds in 12 months, which she attributed to stress being the primary caregiver of her husband who has deteriorating dementia. Her weight today is completely stable from her visit 3 months ago. Her anemia last time was described as mild with normal iron and ferritin. Last that time showed a low iron level. Her PCP is subsequently put her on iron supplementation. At last visit she had no signs symptoms of GI overt GI bleed. She admitted lower fiber and water intake in her diet and last visit recommended increasing Brother fiber and her water intake to improve the bulk and easy passage of her stools.  Today she states her constipation is improved. She has increased the fiber in her diet and started taking the probiotic. Had some minor abdominal pain last week for a day or so which spontaneously resolved. Denies N/V. Is currently having a bowel movement every 2 days, which she states is improved for her. Denies hematochezia and melena. States she thinks she has been taking the iron supplementation recommended by her PCP. Denies fever, chills. Weight is stable now. Denies chest pain, difficulty breathing. Worsening fatigue/weakness, lightheadedness, dizziness. States she's still under a lot of stress due to caregiver status. She has a support system consisting of her other family members. Also states she's having re-worsening of her dysphagia, will get choked up on solid foods with a lot of coughing and some regurgitations. Has a history of Zenker's diverticulum and esophageal web which was dilated x 2 about a year ago. Denies any other upper or lower GI  symptoms.  Past Medical History  Diagnosis Date  . Pacemaker 2002    Permanent Placement  . SSS (sick sinus syndrome)   . Pacemaker 2007    Generator Changed   . Hyperlipidemia   . Hypertension   . CAD (coronary artery disease) 2005    Nonobstructive 40% RCA lesion and normal ejection fraction at cath   . Gastroesophageal reflux disease   . Osteoporosis   . Anxiety disorder   . Osteoarthritis   . Sinoatrial node dysfunction   . Zenker diverticulum     previously evaluated by Dr. Erik Obey in [id number].     Past Surgical History  Procedure Laterality Date  . Total abdominal hysterectomy  2002  . Cholecystectomy    . Cystectomy      from back of the neck  . Pacemaker insertion  2002  . Pacemaker placement  2007    replacement   . Esophagogastroduodenoscopy  2008    Dr. Oneida Alar, difficulty passing scope through UES, incomplete fibrous ring at GEJ, hh, multiple benign gastri polyps  . Colonoscopy  2003    no polyps.  . Esophagogastroduodenoscopy N/A 10/17/2013    ZENKER'S, STRICTURE: 10-12.8 MM,  NSAID GASTRITIS FG POLYPS  . Savory dilation N/A 10/17/2013    Procedure: SAVORY DILATION;  Surgeon: Danie Binder, MD;  [facility];  Service: Endoscopy;  Laterality: N/A;  with PEDS GASTROSCOPE  . Maloney dilation N/A 10/17/2013    Procedure: Venia Minks DILATION;  Surgeon: Danie Binder, MD;  [facility];  Service: Endoscopy;  Laterality: N/A;  with PEDS GASTROSCOPE  .  Esophagogastroduodenoscopy (egd) with esophageal dilation N/A 10/30/2013    SLF: 1. Zenkers diverticulum with a large opening at the cricopharyngeus 2. 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 Innumerable Fundic gland polyps 4. Mild NSAID gastritis    Current Outpatient Prescriptions  Medication Sig Dispense Refill  . ALPRAZolam (XANAX) 0.25 MG tablet TAKE 1 TABLET BY MOUTH EVERY NIGHT AT BEDTIME AS NEEDED FOR ANXIETY 30 tablet 1  . aspirin 81 MG tablet Take 81 mg by mouth daily.    . busPIRone (BUSPAR) 5  MG tablet Take 1 tablet (5 mg total) by mouth 2 (two) times daily. 60 tablet 5  . Calcium Carbonate-Vitamin D (CALCIUM 600+D) 600-400 MG-UNIT per tablet Take 1 tablet by mouth daily.    . citalopram (CELEXA) 10 MG tablet TAKE 1 TABLET BY MOUTH EVERY DAY 30 tablet 3  . fenofibrate (TRICOR) 145 MG tablet TAKE 1 TABLET BY MOUTH EVERY DAY 30 tablet 2  . meclizine (ANTIVERT) 25 MG tablet Take 1 tablet (25 mg total) by mouth 2 (two) times daily as needed. 40 tablet 0  . Multiple Vitamin (MULTIVITAMIN WITH MINERALS) TABS tablet Take 1 tablet by mouth daily.    . nitroGLYCERIN (NITROSTAT) 0.4 MG SL tablet Place 1 tablet (0.4 mg total) under the tongue every 5 (five) minutes as needed for chest pain. One tablet under the tongue at onset of chest pains, repeat every five minutes as needed 25 tablet 1  . omeprazole (PRILOSEC) 20 MG capsule TAKE 1 CAPSULE BY MOUTH DAILY 30 capsule 2  . potassium chloride 20 MEQ/15ML (10%) SOLN TAKE 15 MLS BY MOUTH EVERY DAY 500 mL 0  . pravastatin (PRAVACHOL) 40 MG tablet Take 1 tablet (40 mg total) by mouth daily. 30 tablet 5  . triamterene-hydrochlorothiazide (MAXZIDE-25) 37.5-25 MG per tablet TAKE 1 1/2 TABLETS BY MOUTH DAILY 45 tablet 3   No current facility-administered medications for this visit.    Allergies as of 01/17/2015  . (No Known Allergies)    Family History  Problem Relation Age of Onset  . Cancer Brother     prostate  . Heart disease Mother   . Colon cancer Other     sibling, age 51  . Alcohol abuse Sister   . Lung disease Sister     History   Social History  . Marital Status: Married    Spouse Name: N/A  . Number of Children: 2  . Years of Education: N/A   Social History Main Topics  . Smoking status: Never Smoker   . Smokeless tobacco: Not on file  . Alcohol Use: No  . Drug Use: No  . Sexual Activity: Not on file   Other Topics Concern  . None   Social History Narrative    Review of Systems: 10 point ROS negative except as  for noted in HPI.  Physical Exam: BP 131/75 mmHg  Pulse 60  Temp(Src) 97.4 F (36.3 C)  Ht 5\' 11"  (1.803 m)  Wt 156 lb 6.4 oz (70.943 kg)  BMI 21.82 kg/m2 General:   Alert and oriented. No distress noted. Pleasant and cooperative.  Head:  Normocephalic and atraumatic. Eyes:  Conjuctiva clear without scleral icterus. Mouth:  Oral mucosa pink and moist. Good dentition. No lesions. Neck:  Supple, without mass or thyromegaly. Lungs:  Clear to auscultation bilaterally. No wheezes, rales, or rhonchi. No distress.  Heart:  S1, S2 present without murmurs, rubs, or gallops. Regular rate and rhythm. Abdomen:  +BS, soft, non-tender and non-distended. No rebound or guarding.  No HSM or masses noted. Msk:  Symmetrical without gross deformities. Normal posture. Pulses:  2+ DP noted bilaterally Extremities:  Without edema. Neurologic:  Alert and  oriented x4;  grossly normal neurologically. Skin:  Intact without significant lesions or rashes. Cervical Nodes:  No significant cervical adenopathy. Psych:  Alert and cooperative. Normal mood and affect.    01/17/2015 10:10 AM

## 2015-01-17 NOTE — Assessment & Plan Note (Signed)
81 year old female describes 3 worsening of her dysphagia symptoms. She has a history of Zenker's diverticulum, as well as development of a web around the diverticulum which previously needed to be dilated twice in endoscopy within 1 month of each other. Symptoms are manageable until recently at which point she began having dysphagia symptoms including choking on foods, regurgitation, frequent coughing. This is affected her quality of life as it is difficult for her to go out for dinner with friends and family. To evaluate this at this point we will order a barium pill esophagram to assess for repeat narrowing of her esophagus as well as the status of her diverticulum. Based on the results of this procedure she may require repeat endoscopy and dilation. Return for follow-up in 6-8 weeks, or sooner if needed.

## 2015-01-22 ENCOUNTER — Ambulatory Visit (HOSPITAL_COMMUNITY)
Admission: RE | Admit: 2015-01-22 | Discharge: 2015-01-22 | Disposition: A | Discharge status: Home / Self Care | Payer: Medicare Other | Source: Ambulatory Visit | Attending: Nurse Practitioner | Admitting: Nurse Practitioner

## 2015-01-22 DIAGNOSIS — I2510 Atherosclerotic heart disease of native coronary artery without angina pectoris: Secondary | ICD-10-CM | POA: Insufficient documentation

## 2015-01-22 DIAGNOSIS — K219 Gastro-esophageal reflux disease without esophagitis: Secondary | ICD-10-CM | POA: Diagnosis not present

## 2015-01-22 DIAGNOSIS — R1314 Dysphagia, pharyngoesophageal phase: Secondary | ICD-10-CM | POA: Insufficient documentation

## 2015-01-22 DIAGNOSIS — K225 Diverticulum of esophagus, acquired: Secondary | ICD-10-CM | POA: Diagnosis not present

## 2015-01-22 DIAGNOSIS — I10 Essential (primary) hypertension: Secondary | ICD-10-CM | POA: Diagnosis not present

## 2015-01-22 DIAGNOSIS — E785 Hyperlipidemia, unspecified: Secondary | ICD-10-CM | POA: Diagnosis not present

## 2015-01-23 ENCOUNTER — Other Ambulatory Visit: Payer: Self-pay | Admitting: Family Medicine

## 2015-01-28 ENCOUNTER — Other Ambulatory Visit: Payer: Self-pay | Admitting: Family Medicine

## 2015-01-31 ENCOUNTER — Other Ambulatory Visit: Payer: Self-pay

## 2015-01-31 MED ORDER — MECLIZINE HCL 25 MG PO TABS: 25.0000 mg | ORAL_TABLET | Freq: Two times a day (BID) | ORAL | Status: DC | PRN

## 2015-02-05 ENCOUNTER — Encounter: Payer: Self-pay | Admitting: *Deleted

## 2015-02-06 ENCOUNTER — Telehealth: Payer: Self-pay

## 2015-02-06 NOTE — Telephone Encounter (Signed)
Pt is calling for results for xray. Please advise

## 2015-02-08 NOTE — Telephone Encounter (Signed)
Pt called again this morning wanting to know her test results

## 2015-02-11 ENCOUNTER — Other Ambulatory Visit: Payer: Self-pay

## 2015-02-11 NOTE — Telephone Encounter (Signed)
PT IS CALLING AGAIN FOR TEST RESULTS. PLEASE ADVISE

## 2015-02-12 NOTE — Progress Notes (Signed)
Quick Note:  Forwarding to Marydel for the referral for the speech therapy evaluation. ______

## 2015-02-12 NOTE — Telephone Encounter (Signed)
See results note. 

## 2015-02-13 ENCOUNTER — Other Ambulatory Visit: Payer: Self-pay

## 2015-02-13 DIAGNOSIS — R1319 Other dysphagia: Secondary | ICD-10-CM

## 2015-02-13 DIAGNOSIS — R1310 Dysphagia, unspecified: Secondary | ICD-10-CM

## 2015-02-26 ENCOUNTER — Other Ambulatory Visit: Payer: Self-pay | Admitting: Gastroenterology

## 2015-02-26 DIAGNOSIS — R1310 Dysphagia, unspecified: Secondary | ICD-10-CM

## 2015-03-07 ENCOUNTER — Ambulatory Visit (HOSPITAL_COMMUNITY): Payer: Medicare Other | Admitting: Speech Pathology

## 2015-03-07 ENCOUNTER — Other Ambulatory Visit: Payer: Self-pay

## 2015-03-07 ENCOUNTER — Ambulatory Visit (INDEPENDENT_AMBULATORY_CARE_PROVIDER_SITE_OTHER): Payer: Medicare Other | Admitting: Nurse Practitioner

## 2015-03-07 ENCOUNTER — Encounter: Payer: Self-pay | Admitting: Nurse Practitioner

## 2015-03-07 ENCOUNTER — Other Ambulatory Visit (HOSPITAL_COMMUNITY): Payer: Medicare Other

## 2015-03-07 VITALS — BP 113/60 | HR 60 | Temp 97.6°F | Ht 71.0 in | Wt 155.0 lb

## 2015-03-07 DIAGNOSIS — R1310 Dysphagia, unspecified: Secondary | ICD-10-CM | POA: Diagnosis not present

## 2015-03-07 DIAGNOSIS — K225 Diverticulum of esophagus, acquired: Secondary | ICD-10-CM

## 2015-03-07 NOTE — Assessment & Plan Note (Signed)
Patient with continued dysphagia. She had an EGD with dilation approximately year and a half ago. At last visit we ordered a barium pill esophagram which showed no stricture but age-related dysmotility and silent aspiration. Wrote for a referral for speech lying which pathology to further evaluate and make recommendations on dysphagia regarding diet and swallowing tips. However patient has put this off for quite some time and does not want to see them until July when her daughter concerted with her. She asked about having an EGD with dilation but explained in-depth to her that there is nothing at this point to stretch and that her symptoms related to a week esophagus due to advancing age. Recommended continued soft diet, chop all meats, eat nothing chunky, takes low bites and chew thoroughly. Discussed case with Dr. Oneida Alar who will see the patient in the office in 2 months.

## 2015-03-07 NOTE — Assessment & Plan Note (Addendum)
Patient with continued dysphagia. She had an EGD with dilation approximately year and a half ago. At last visit we ordered a barium pill esophagram which showed no stricture but age-related dysmotility and silent aspiration. Wrote for a referral for speech lying which pathology to further evaluate and make recommendations on dysphagia regarding diet and swallowing tips. However patient has put this off for quite some time and does not want to see them until July when her daughter concerted with her. She asked about having an EGD with dilation but explained in-depth to her that there is nothing at this point to stretch and that her symptoms related to a week esophagus due to advancing age. Recommended continued soft diet, chop all meats, eat nothing chunky, takes low bites and chew thoroughly. Discussed case with Dr. Oneida Alar who will see the patient in the office in 2 months. Strongly recommended the patient see speech therapist due to high risk of aspiration.

## 2015-03-07 NOTE — Patient Instructions (Signed)
1. Continue to follow a soft diet making treated chop all your meats. Do not eat large chunks of anything. Take very small bites, chew appropriately, and take care in swallowing. 2. Follow-up with the speech pathologist as soon as you're able to for further recommendations on diet changes and swallowing techniques to help with her symptoms. 3. Follow-up in 2 months with Dr. Oneida Alar here in our office.

## 2015-03-07 NOTE — Progress Notes (Signed)
Referring Provider: Fayrene Helper, MD Primary Care Physician:  [name], MD Primary GI:  Dr. Oneida Alar  Chief Complaint  Patient presents with  . Dysphagia    HPI:   81 year old female presents for follow-up on dysphagia and Zenker's diverticulum. Last office visit 01/17/2015 patient with recurrent dysphagia symptoms which were worsening. Barium pill esophagram was ordered which showed age-related dysmotility with some silent aspiration of the trachea. Requested speech therapy evaluation related to high risk aspiration and results were for Jenny Reichmann the PCP.  Today she states the SLP appointment was cancelled. Her lower esophageal dysphagia is improved but feels like food wants to get stuck in her throat (and she points to her larynx area when stating this.) Denies regurgitation. Has to drink a lot of water and/or milk to get food to pass. Chops up her meats. Also with pill dysphagia. Denies N/V, abdominal pains. Does have worsening cough after eating. Denies fever, subjective weight loss. No objective weight loss from last office visit. GERD symptoms are improved. Constipation is still much improved. Denies any other upper or lower GI symptoms.  Past Medical History  Diagnosis Date  . Pacemaker 2002    Permanent Placement  . SSS (sick sinus syndrome)   . Pacemaker 2007    Generator Changed   . Hyperlipidemia   . Hypertension   . CAD (coronary artery disease) 2005    Nonobstructive 40% RCA lesion and normal ejection fraction at cath   . Gastroesophageal reflux disease   . Osteoporosis   . Anxiety disorder   . Osteoarthritis   . Sinoatrial node dysfunction   . Zenker diverticulum     previously evaluated by Dr. Erik Obey in 0160.     Past Surgical History  Procedure Laterality Date  . Total abdominal hysterectomy  2002  . Cholecystectomy    . Cystectomy      from back of the neck  . Pacemaker insertion  2002  . Pacemaker placement  2007    replacement   .  Esophagogastroduodenoscopy  2008    Dr. Oneida Alar, difficulty passing scope through UES, incomplete fibrous ring at GEJ, hh, multiple benign gastri polyps  . Colonoscopy  2003    no polyps.  . Esophagogastroduodenoscopy N/A 10/17/2013    ZENKER'S, STRICTURE: 10-12.8 MM,  NSAID GASTRITIS FG POLYPS  . Savory dilation N/A 10/17/2013    Procedure: SAVORY DILATION;  Surgeon: Danie Binder, MD;  Location: [facility];  Service: Endoscopy;  Laterality: N/A;  with PEDS GASTROSCOPE  . Maloney dilation N/A 10/17/2013    Procedure: Venia Minks DILATION;  Surgeon: Danie Binder, MD;  Location: [facility];  Service: Endoscopy;  Laterality: N/A;  with PEDS GASTROSCOPE  . Esophagogastroduodenoscopy (egd) with esophageal dilation N/A 10/30/2013    SLF: 1. Zenkers diverticulum with a large opening at the cricopharyngeus 2. 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 Innumerable Fundic gland polyps 4. Mild NSAID gastritis    Current Outpatient Prescriptions  Medication Sig Dispense Refill  . ALPRAZolam (XANAX) 0.25 MG tablet TAKE 1 TABLET BY MOUTH EVERY NIGHT AT BEDTIME AS NEEDED FOR ANXIETY 30 tablet 1  . aspirin 81 MG tablet Take 81 mg by mouth daily.    . busPIRone (BUSPAR) 5 MG tablet TAKE 1 TABLET BY MOUTH TWICE DAILY 60 tablet 3  . Calcium Carbonate-Vitamin D (CALCIUM 600+D) 600-400 MG-UNIT per tablet Take 1 tablet by mouth daily.    . citalopram (CELEXA) 10 MG tablet TAKE 1 TABLET BY MOUTH EVERY DAY 30  tablet 3  . fenofibrate (TRICOR) 145 MG tablet TAKE 1 TABLET BY MOUTH EVERY DAY 30 tablet 3  . meclizine (ANTIVERT) 25 MG tablet Take 1 tablet (25 mg total) by mouth 2 (two) times daily as needed. 40 tablet 0  . Multiple Vitamin (MULTIVITAMIN WITH MINERALS) TABS tablet Take 1 tablet by mouth daily.    . nitroGLYCERIN (NITROSTAT) 0.4 MG SL tablet Place 1 tablet (0.4 mg total) under the tongue every 5 (five) minutes as needed for chest pain. One tablet under the tongue at onset of chest pains, repeat every five minutes  as needed 25 tablet 1  . omeprazole (PRILOSEC) 20 MG capsule TAKE 1 CAPSULE BY MOUTH DAILY 30 capsule 2  . potassium chloride 20 MEQ/15ML (10%) SOLN TAKE 15 MLS BY MOUTH EVERY DAY 500 mL 0  . pravastatin (PRAVACHOL) 40 MG tablet Take 1 tablet (40 mg total) by mouth daily. 30 tablet 5  . triamterene-hydrochlorothiazide (MAXZIDE-25) 37.5-25 MG per tablet TAKE 1 1/2 TABLET BY MOUTH DAILY 45 tablet 1   No current facility-administered medications for this visit.    Allergies as of 03/07/2015  . (No Known Allergies)    Family History  Problem Relation Age of Onset  . Cancer Brother     prostate  . Heart disease Mother   . Colon cancer Other     sibling, age 15  . Alcohol abuse Sister   . Lung disease Sister     History   Social History  . Marital Status: Married    Spouse Name: N/A  . Number of Children: 2  . Years of Education: N/A   Social History Main Topics  . Smoking status: Never Smoker   . Smokeless tobacco: Never Used  . Alcohol Use: No  . Drug Use: No  . Sexual Activity: Not on file   Other Topics Concern  . None   Social History Narrative    Review of Systems: General: Negative for anorexia, weight loss, fever, chills, fatigue, weakness. Eyes: Negative for vision changes. . CV: Negative for chest pain, angina, palpitations, dyspnea on exertion, peripheral edema.  Respiratory: Negative for dyspnea at rest, dyspnea on exertion, cough, sputum, wheezing.  GI: See history of present illness. Endo: Negative for unusual weight change.    Physical Exam: BP 113/60 mmHg  Pulse 60  Temp(Src) 97.6 F (36.4 C) (Oral)  Ht 5\' 11"  (1.803 m)  Wt 155 lb (70.308 kg)  BMI 21.63 kg/m2 General:   Alert and oriented. Pleasant and cooperative. Well-nourished and well-developed.  Head:  Normocephalic and atraumatic. Cardiovascular:  S1, S2 present without murmurs appreciated. Normal pulses noted. Extremities without clubbing or edema. Respiratory:  Clear to auscultation  bilaterally. No wheezes, rales, or rhonchi. No distress.  Gastrointestinal:  +BS, soft, non-tender and non-distended. No HSM noted. No guarding or rebound. No masses appreciated.  Rectal:  Deferred  Neurologic:  Alert and oriented x4;  grossly normal neurologically. Psych:  Alert and cooperative. Normal mood and affect.    03/07/2015 9:51 AM

## 2015-03-08 ENCOUNTER — Encounter: Payer: Self-pay | Admitting: *Deleted

## 2015-03-19 ENCOUNTER — Encounter: Payer: Self-pay | Admitting: *Deleted

## 2015-03-19 ENCOUNTER — Ambulatory Visit: Payer: Medicare Other | Admitting: Family Medicine

## 2015-03-20 NOTE — Progress Notes (Signed)
cc'd to pcp 

## 2015-03-26 ENCOUNTER — Other Ambulatory Visit: Payer: Self-pay

## 2015-03-26 MED ORDER — BUSPIRONE HCL 5 MG PO TABS: 5.0000 mg | ORAL_TABLET | Freq: Two times a day (BID) | ORAL | Status: DC

## 2015-03-27 ENCOUNTER — Encounter: Payer: Self-pay | Admitting: Gastroenterology

## 2015-04-03 ENCOUNTER — Telehealth: Payer: Self-pay | Admitting: Gastroenterology

## 2015-04-03 ENCOUNTER — Other Ambulatory Visit: Payer: Self-pay

## 2015-04-03 ENCOUNTER — Ambulatory Visit: Payer: Medicare Other | Admitting: Gastroenterology

## 2015-04-03 ENCOUNTER — Telehealth: Payer: Self-pay | Admitting: General Practice

## 2015-04-03 DIAGNOSIS — E785 Hyperlipidemia, unspecified: Secondary | ICD-10-CM

## 2015-04-03 MED ORDER — PRAVASTATIN SODIUM 40 MG PO TABS: 40.0000 mg | ORAL_TABLET | Freq: Every day | ORAL | Status: DC

## 2015-04-03 MED ORDER — OMEPRAZOLE 20 MG PO CPDR: 20.0000 mg | DELAYED_RELEASE_CAPSULE | Freq: Every day | ORAL | Status: DC

## 2015-04-03 NOTE — Telephone Encounter (Signed)
Patient called this afternoon asking if we were able to get things straightened out for her to see SF. I asked if she was trying to get scheduled for an OV or a procedure and she didn't know. She said that she was scheduled to see SF in August and wanted to see her sooner than that. I seen where DS had LMOM for patient around 11am for a return call and I told patient that the nurse had tried to call her earlier but she wasn't available at the moment and not sure what it was in regards to. Please call patient back at [phone].

## 2015-04-03 NOTE — Telephone Encounter (Signed)
I spoke to pt and she would just like to see Dr. Oneida Alar before August if at all possible.

## 2015-04-03 NOTE — Telephone Encounter (Signed)
Patient called in stating she is unable to swallow and that she is coughing.  She would like to be seen sooner than August 2016 or wanted to know if Dr. Eden Lathe could prescribe something for her excessive coughing after she eats.  Routing to [facility]  Patient has an appt with Speech pathology on 04/09/15 at 1:00 pm.

## 2015-04-03 NOTE — Telephone Encounter (Signed)
See separate phone note of today.  

## 2015-04-03 NOTE — Telephone Encounter (Signed)
I called and LMOM for a return call. Routing to Walden Field, NP who saw pt in the office on 03/07/2015.

## 2015-04-04 NOTE — Telephone Encounter (Addendum)
PLEASE CALL PT. SHE HAD A BARIUM SWALLOW IN April THAT SHOWED ASPIRATION OF LIQUIDS WHEN SHE SWALLOWS, A WEAK ESOPHAGUS, AND A  DIVERTICULUM AT THE TOP OF HER ESOPHAGUS. ALL OF THESE WILL MAKE HER COUGH AFTER SHE EATS OR DRINKS ANYTHING. THIS PROBLEM IS CAUSED BY WEAK NERVE IMPULSES TO THE BACK OF HER THROAT AND THE POCKET OR DIVERTICULUM AT THE TOP OF HER ESOPHAGUS. SHE SHOULD COMPLETE THE SWALLOWING STUDY AND HAVE AN OPV JUL 7 AT 1130 TO DISCUSS W/ SLF.

## 2015-04-05 ENCOUNTER — Other Ambulatory Visit: Payer: Self-pay

## 2015-04-05 MED ORDER — CITALOPRAM HYDROBROMIDE 10 MG PO TABS: 10.0000 mg | ORAL_TABLET | Freq: Every day | ORAL | Status: DC

## 2015-04-05 NOTE — Telephone Encounter (Signed)
PT is aware. Routing to Manuela Schwartz to enter the [place] for 04/11/2015 @ 11:30 AM.

## 2015-04-05 NOTE — Telephone Encounter (Signed)
I called pt and made her aware she is scheduled with Dr. Oneida Alar 04/11/15 at 11:30. Pt confirmed.

## 2015-04-09 ENCOUNTER — Ambulatory Visit (HOSPITAL_COMMUNITY)
Admission: RE | Admit: 2015-04-09 | Discharge: 2015-04-09 | Disposition: A | Discharge status: Home / Self Care | Payer: Medicare Other | Source: Ambulatory Visit | Attending: Gastroenterology | Admitting: Gastroenterology

## 2015-04-09 ENCOUNTER — Ambulatory Visit (HOSPITAL_COMMUNITY): Payer: Medicare Other | Attending: Gastroenterology | Admitting: Speech Pathology

## 2015-04-09 DIAGNOSIS — K225 Diverticulum of esophagus, acquired: Secondary | ICD-10-CM | POA: Diagnosis not present

## 2015-04-09 DIAGNOSIS — R1310 Dysphagia, unspecified: Secondary | ICD-10-CM | POA: Diagnosis not present

## 2015-04-09 DIAGNOSIS — R1314 Dysphagia, pharyngoesophageal phase: Secondary | ICD-10-CM | POA: Diagnosis not present

## 2015-04-09 NOTE — Therapy (Signed)
[place], [place], [address] Phone: [phone]   Fax:  [phone]  Modified Barium Swallow  Patient Details  Name: Candace Cervantes MRN: [id number] Date of Birth: 03/25/1924 Referring Provider:  Orvil Feil, NP  Encounter Date: 04/09/2015      End of Session - 04/09/15 1757    Visit Number 1   Number of Visits 1   Authorization Type [employer] Medicare   SLP Start Time 1310   SLP Stop Time  7564   SLP Time Calculation (min) 37 min   Activity Tolerance Patient tolerated treatment well      Past Medical History  Diagnosis Date  . Pacemaker 2002    Permanent Placement  . SSS (sick sinus syndrome)   . Pacemaker 2007    Generator Changed   . Hyperlipidemia   . Hypertension   . CAD (coronary artery disease) 2005    Nonobstructive 40% RCA lesion and normal ejection fraction at cath   . Gastroesophageal reflux disease   . Osteoporosis   . Anxiety disorder   . Osteoarthritis   . Sinoatrial node dysfunction   . Zenker diverticulum     previously evaluated by Dr. Erik Obey in 3329.     Past Surgical History  Procedure Laterality Date  . Total abdominal hysterectomy  2002  . Cholecystectomy    . Cystectomy      from back of the neck  . Pacemaker insertion  2002  . Pacemaker placement  2007    replacement   . Esophagogastroduodenoscopy  2008    Dr. Oneida Alar, difficulty passing scope through UES, incomplete fibrous ring at GEJ, hh, multiple benign gastri polyps  . Colonoscopy  2003    no polyps.  . Esophagogastroduodenoscopy N/A 10/17/2013    ZENKER'S, STRICTURE: 10-12.8 MM,  NSAID GASTRITIS FG POLYPS  . Savory dilation N/A 10/17/2013    Procedure: SAVORY DILATION;  Surgeon: Danie Binder, MD;  Location: [facility];  Service: Endoscopy;  Laterality: N/A;  with PEDS GASTROSCOPE  . Maloney dilation N/A 10/17/2013    Procedure: Venia Minks DILATION;  Surgeon: Danie Binder, MD;  Location: [facility];  Service:  Endoscopy;  Laterality: N/A;  with PEDS GASTROSCOPE  . Esophagogastroduodenoscopy (egd) with esophageal dilation N/A 10/30/2013    SLF: 1. Zenkers diverticulum with a large opening at the cricopharyngeus 2. 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 Innumerable Fundic gland polyps 4. Mild NSAID gastritis    There were no vitals filed for this visit.  Visit Diagnosis: Dysphagia, pharyngoesophageal phase      Subjective Assessment - 04/09/15 1748    Subjective "I can't get crunchy things to go down... have a hard time with pills."   Patient is accompained by: Family member   Special Tests MBSS   Currently in Pain? No/denies             General - 04/09/15 1749    General Information   Date of Onset 01/04/15   Other Pertinent Information 81 year old female presents for MBSS due to dysphagia and Zenker's diverticulum. Barium pill esophagram was completed in April which showed 81 year old female presents for MBSS due to dysphagia and Zenker's diverticulum. Barium pill esophagram was completed in April which showed age-related dysmotility with some silent aspiration of the trachea. with some silent aspiration of the trachea. Her lower esophageal dysphagia is improved but feels like food wants to get stuck in her throat (and she points to her larynx area when stating this.) Denies regurgitation. Has to drink a lot of water and/or milk to get food to pass. Chops up her meats. Also with pill dysphagia. Denies  N/V, abdominal pains. Does have worsening cough after eating. Pt reports subjective weight loss.    Type of Study Other (Comment)  MBSS   Reason for Referral Objectively evaluate swallowing function   Previous Swallow Assessment barium swallow in April 2016   Diet Prior to this Study Dysphagia 3 (soft);Thin liquids   Temperature Spikes Noted No   Respiratory Status Room air   History of Recent Intubation No   Behavior/Cognition Alert;Cooperative;Pleasant mood   Oral Cavity - Dentition Adequate natural dentition/normal for age   Oral Motor / Sensory Function Within functional limits   Self-Feeding Abilities Able to feed self   Patient Positioning Upright in  chair/Tumbleform   Baseline Vocal Quality Normal   Volitional Cough Strong   Volitional Swallow Able to elicit   Anatomy Within functional limits   Pharyngeal Secretions Not observed secondary MBS            Oral Preparation/Oral Phase - 11-Apr-2015 1750    Oral Preparation/Oral Phase   Oral Phase Within functional limits   Electrical stimulation - Oral Phase   Was Electrical Stimulation Used No          Pharyngeal Phase - 04-11-2015 1750    Pharyngeal Phase   Pharyngeal Phase Impaired   Pharyngeal - Thin   Pharyngeal- Thin Cup Within functional limits   Pharyngeal- Thin Straw Within functional limits   Pharyngeal - Solids   Pharyngeal- Puree Pharyngeal residue - cp segment  reflux from Zenker's into pyriforms after the swallow   Electrical Stimulation - Pharyngeal Phase   Was Electrical Stimulation Used No          Cricopharyngeal Phase - 2015/04/11 1751    Cervical Esophageal Phase   Cervical Esophageal Phase Impaired   Cervical Esophageal Phase - Solids   Puree Esophageal backflow into the pharynx   Cervical Esophageal Phase - Comment   Cervical Esophageal Comment Moderate to large Zenker's diverticulum confirmed by radiologist which filled with thin liquids and puree; significant backflow from Zenker's to pyriforms noted with puree   Other Esophageal Phase Observations general dismotility in distal esophagus noted                  Plan - 04-11-15 1757    Clinical Impression Statement Pt presents with essentially WNL oropharngeal swallow, however severe cervical esophageal phase dysphagia due to moderate to large size Zenker's diverticulum (confirmed by radiologist) which then negatively impacts pharyngeal phase due to backflow from cervical esophagus into pyriforms. Pt was reported to silently aspirate during barium swallow completed in April and this was not present today, however pt may have taken large, sequential swallows during the barium exam.   Thins  fill Zenker's about 3/4 full and eventually most passes further into esophagus (retention in Zenker's remains however). When pt presented with pureed solids, the bolus filled the Zenker's and eventually backflowed into pharynx (filling pyriforms). Pt with wet/gurgly throat clearing and audible/gurgly swallows when attempting to clear bolus. External pressure via hand to neck applied during swallows in attempt to empty the Zenker's, however this was unsuccessful. Pt is at high risk for malnourishment due to poor tolerance of solids and semi solids and at significant risk for aspiration due to backflow of bolus into pyriforms after the swallow.   Recommend referral to ENT or possibly GI to see if intervention is possible given above results.   Consulted and Agree with Plan of Care Patient          G-Codes - 2015/04/11  1759    Functional Assessment Tool Used clinical judgement; MBSS   Functional Limitations Swallowing   Swallow Current Status (V0350) At least 60 percent but less than 80 percent impaired, limited or restricted   Swallow Goal Status (K9381) At least 1 percent but less than 20 percent impaired, limited or restricted   Swallow Discharge Status [phone]) At least 60 percent but less than 80 percent impaired, limited or restricted          Recommendations/Treatment - 04/09/15 1754    Swallow Evaluation Recommendations   Recommended Consults Consider ENT evaluation  Strongly recommend referral for repair of Zenker's diverticulum   SLP Diet Recommendations Thin   Liquid Administration via Cup   Medication Administration --  crushed in ice cream   Supervision Patient able to self feed   Compensations Slow rate;Small sips/bites          Prognosis - 04/09/15 1755    Prognosis   Prognosis for Safe Diet Advancement Guarded   Barriers to Reach Goals Severity of deficits   Barriers/Prognosis Comment Pt will need Zenker's addressed   Individuals Consulted   Consulted and Agree with  Results and Recommendations Patient;Family member/caregiver   Family Member Consulted daughter   Report Sent to  Referring physician      Problem List Patient Active Problem List   Diagnosis Date Noted  . Medicare annual wellness visit, subsequent 11/14/2014  . Anemia 10/19/2014  . Constipation 10/19/2014  . IGT (impaired glucose tolerance) 09/27/2014  . Annual physical exam 08/06/2014  . Unintended weight loss 08/06/2014  . Need for vaccination with 13-polyvalent pneumococcal conjugate vaccine 05/14/2014  . Osteopenia 02/20/2014  . Esophageal dysphagia 09/15/2013  . Anemia of chronic disease 09/15/2013  . Atrial fibrillation 05/16/2013  . Sinoatrial node dysfunction   . CAD (coronary artery disease) 08/10/2011  . Cardiac pacemaker in situ 08/10/2011  . MCI (mild cognitive impairment) 12/15/2008  . ALLERGIC RHINITIS CAUSE UNSPECIFIED 09/20/2008  . ZENKER'S DIVERTICULUM 06/14/2008  . GERD 06/14/2008  . Dysphagia 06/14/2008  . Hyperlipemia 02/15/2008  . ANXIETY DISORDER, GENERALIZED 02/15/2008  . Essential hypertension 02/15/2008  . OSTEOARTHRITIS 02/15/2008   Thank you,  Genene Churn, [place]  [place] 04/09/2015, 6:00 PM  [facility] [address] [place], [place], [address] Phone: [phone]   Fax:  [phone]

## 2015-04-10 ENCOUNTER — Other Ambulatory Visit: Payer: Self-pay

## 2015-04-10 MED ORDER — OMEPRAZOLE 20 MG PO CPDR: 20.0000 mg | DELAYED_RELEASE_CAPSULE | Freq: Every day | ORAL | Status: DC

## 2015-04-11 ENCOUNTER — Other Ambulatory Visit: Payer: Self-pay

## 2015-04-11 ENCOUNTER — Ambulatory Visit (INDEPENDENT_AMBULATORY_CARE_PROVIDER_SITE_OTHER): Payer: Medicare Other | Admitting: Gastroenterology

## 2015-04-11 VITALS — BP 119/66 | HR 78 | Temp 97.9°F | Ht 71.0 in | Wt 153.4 lb

## 2015-04-11 DIAGNOSIS — K225 Diverticulum of esophagus, acquired: Secondary | ICD-10-CM

## 2015-04-11 DIAGNOSIS — R1310 Dysphagia, unspecified: Secondary | ICD-10-CM

## 2015-04-11 DIAGNOSIS — R634 Abnormal weight loss: Secondary | ICD-10-CM | POA: Diagnosis not present

## 2015-04-11 NOTE — Assessment & Plan Note (Signed)
DUE TO DYSPHAGIA.  CONTINUE TO MONITOR SYMPTOMS. FOLLOW UP IN 4 MOS.

## 2015-04-11 NOTE — Progress Notes (Signed)
Subjective:    Patient ID: Candace Cervantes, female    DOB: 03/25/1924, 81 y.o.   MRN: [id number]  [name], MD  HPI Pt c/o dysphagia. [name] APR 2016 showed ESO motility disorder. MBS JUL 5 SHOWS REFLUX INTO THE PHARYNX AFTER SWALLOWING DUE TO ZENKER'S. PT 'S WEIGHT DOWN 10 LBS SINCE JUN 2015. TROUBLE SWALLOWING EVERY DAY SEVERAL TIMES A DAY. POSTERIOR NASAL DRAINAGE MAKES IT WORSE. FOOD,PILLS AND NOT SO MUCH LIQUID GETS STUCK. WORKING AROUND IT BY SOFTENING CEREAL AND MEATS SHE CHEWS AND CHEWS. GOT TIRED OF COUGHING AFTER EATING. CAN'T DRINK ENSURE BECAUSE IT CAUSES GI UPSET. RARE SEVERE NAUSEA. TROUBLE WITH PND. BMs: EVERY 3 DAYS. TAKES CARE OF DEMENTED HUSBAND. GETS SOB WHEN SHE DOES TOO MUCH AND A LITTLE WHEN SHE EATS AND THEN COUGHS.  PT DENIES FEVER, CHILLS, HEMATOCHEZIA, vomiting, melena, diarrhea, CHEST PAIN, SHORTNESS OF BREATH, abdominal pain, OR heartburn or indigestion.  Past Medical History  Diagnosis Date  . Pacemaker 2002    Permanent Placement  . SSS (sick sinus syndrome)   . Pacemaker 2007    Generator Changed   . Hyperlipidemia   . Hypertension   . CAD (coronary artery disease) 2005    Nonobstructive 40% RCA lesion and normal ejection fraction at cath   . Gastroesophageal reflux disease   . Osteoporosis   . Anxiety disorder   . Osteoarthritis   . Sinoatrial node dysfunction   . Zenker diverticulum     previously evaluated by Dr. Erik Obey in 2979.    Past Surgical History  Procedure Laterality Date  . Total abdominal hysterectomy  2002  . Cholecystectomy    . Cystectomy      from back of the neck  . Pacemaker insertion  2002  . Pacemaker placement  2007    replacement   . Esophagogastroduodenoscopy  2008    Dr. Oneida Alar, difficulty passing scope through UES, incomplete fibrous ring at GEJ, hh, multiple benign gastri polyps  . Colonoscopy  2003    no polyps.  . Esophagogastroduodenoscopy N/A 10/17/2013    ZENKER'S, STRICTURE: 10-12.8 MM,  NSAID GASTRITIS  FG POLYPS  . Savory dilation N/A 10/17/2013    Procedure: SAVORY DILATION;  Surgeon: Danie Binder, MD;  [facility];  Service: Endoscopy;  Laterality: N/A;  with PEDS GASTROSCOPE  . Maloney dilation N/A 10/17/2013    Procedure: Venia Minks DILATION;  Surgeon: Danie Binder, MD;  Location: [facility];  Service: Endoscopy;  Laterality: N/A;  with PEDS GASTROSCOPE  . Esophagogastroduodenoscopy (egd) with esophageal dilation N/A 10/30/2013    SLF: 1. Zenkers diverticulum with a large opening at the cricopharyngeus 2. 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 Innumerable Fundic gland polyps 4. Mild NSAID gastritis   No Known Allergies  Current Outpatient Prescriptions  Medication Sig Dispense Refill  . ALPRAZolam (XANAX) 0.25 MG tablet TAKE 1 TABLET BY MOUTH EVERY NIGHT AT BEDTIME AS NEEDED FOR ANXIETY    . aspirin 81 MG tablet Take 81 mg by mouth daily.    . busPIRone (BUSPAR) 5 MG tablet Take 1 tablet (5 mg total) by mouth 2 (two) times daily.    . Calcium Carbonate-Vitamin D (CALCIUM 600+D) 600-400 MG-UNIT per tablet Take 1 tablet by mouth daily.    . citalopram (CELEXA) 10 MG tablet Take 1 tablet (10 mg total) by mouth daily.    . fenofibrate (TRICOR) 145 MG tablet TAKE 1 TABLET BY MOUTH EVERY DAY    . meclizine (ANTIVERT) 25 MG tablet  Take 1 tablet (25 mg total) by mouth 2 (two) times daily as needed.    . Multiple Vitamin (MULTIVITAMIN WITH MINERALS) TABS tablet Take 1 tablet by mouth daily.    . nitroGLYCERIN (NITROSTAT) 0.4 MG SL tablet Place 1 tablet (0.4 mg total) under the tongue every 5 (five) minutes as needed for chest pain.     Marland Kitchen omeprazole (PRILOSEC) 20 MG capsule Take 1 capsule (20 mg total) by mouth daily.    . potassium chloride 20 MEQ/15ML (10%) SOLN TAKE 15 MLS BY MOUTH EVERY DAY    . pravastatin (PRAVACHOL) 40 MG tablet Take 1 tablet (40 mg total) by mouth daily.    Marland Kitchen triamterene-hydrochlorothiazide (MAXZIDE-25) 37.5-25 MG per tablet TAKE 1 1/2 TABLET BY MOUTH DAILY      Review of Systems     Objective:   Physical Exam  Constitutional: She is oriented to person, place, and time. She appears well-developed and well-nourished. No distress.  HENT:  Head: Normocephalic and atraumatic.  Mouth/Throat: No oropharyngeal exudate.  False teeth, dry mucosa  Eyes: Pupils are equal, round, and reactive to light. No scleral icterus.  Neck: Normal range of motion. Neck supple.  Cardiovascular: Normal rate, regular rhythm and normal heart sounds.   Pulmonary/Chest: Effort normal and breath sounds normal. No respiratory distress.  Abdominal: Soft. Bowel sounds are normal. She exhibits no distension. There is no tenderness.  Musculoskeletal: She exhibits no edema.  Able to ambulate without assistance  Lymphadenopathy:    She has no cervical adenopathy.  Neurological: She is alert and oriented to person, place, and time.  NO  NEW FOCAL DEFICITS   Psychiatric: She has a normal mood and affect.  Vitals reviewed.         Assessment & Plan:

## 2015-04-11 NOTE — Assessment & Plan Note (Addendum)
CAUSING CLINICALLY SIGNIFICANT DYSPHAGIA ASSOCIATED WITH WEIGHT LOSS AND POSTERIOR NASAL DRIP.  DISCUSSED BENEFITS, AND MANAGEMENT OF ZENKER'S DIVERTICULUM WIT PT AND DAUGHTER. REVIEWED MBS FROM JUL 5. ADD NASAL SALINE SPRAY BID. ENT REFERRAL FOR ADDITIONAL TREATMENT OPTIONS. FOLLOW UP IN 4 MOS.

## 2015-04-11 NOTE — Assessment & Plan Note (Signed)
DUE TO ZENKER'S AND AGE RELATED ESOPHAGEAL MOTILITY DISORDER.  DIET MODIFICATION FOLLOW UP IN 4 MOS.

## 2015-04-11 NOTE — Patient Instructions (Signed)
CONTINUE TO MONITOR YOUR WEIGHT. ADD CARNATION INSTANT BREAKFAST WITH SOY or ALMOND MILK THREE TO FOUR TIMES DAILY.  SEE EAR, NOSE, AND THROAT TO DISCUSS POSTERIOR NASAL DRAINAGE AND TREATMENT OPTIONS FOR YOUR ZENKER'S DIVERTICULUM.  USE SALINE NASAL SPRAY TWO SPRAYS IN EACH NARES TWICE DAILY TO THIN POSTERIOR NASAL SECRETIONS.   FOLLOW UP IN 4 MOS.

## 2015-04-11 NOTE — Progress Notes (Signed)
CC'ED TO PCP 

## 2015-04-17 ENCOUNTER — Encounter: Payer: Self-pay | Admitting: Family Medicine

## 2015-04-17 ENCOUNTER — Ambulatory Visit (INDEPENDENT_AMBULATORY_CARE_PROVIDER_SITE_OTHER): Payer: Medicare Other | Admitting: Family Medicine

## 2015-04-17 VITALS — BP 130/74 | HR 60 | Resp 16 | Ht 71.0 in | Wt 153.4 lb

## 2015-04-17 DIAGNOSIS — R7302 Impaired glucose tolerance (oral): Secondary | ICD-10-CM | POA: Diagnosis not present

## 2015-04-17 DIAGNOSIS — R21 Rash and other nonspecific skin eruption: Secondary | ICD-10-CM | POA: Insufficient documentation

## 2015-04-17 DIAGNOSIS — E785 Hyperlipidemia, unspecified: Secondary | ICD-10-CM

## 2015-04-17 DIAGNOSIS — D638 Anemia in other chronic diseases classified elsewhere: Secondary | ICD-10-CM

## 2015-04-17 DIAGNOSIS — R634 Abnormal weight loss: Secondary | ICD-10-CM

## 2015-04-17 DIAGNOSIS — I10 Essential (primary) hypertension: Secondary | ICD-10-CM | POA: Diagnosis not present

## 2015-04-17 DIAGNOSIS — K2100 Gastro-esophageal reflux disease with esophagitis, without bleeding: Secondary | ICD-10-CM

## 2015-04-17 DIAGNOSIS — F418 Other specified anxiety disorders: Secondary | ICD-10-CM

## 2015-04-17 DIAGNOSIS — F411 Generalized anxiety disorder: Secondary | ICD-10-CM

## 2015-04-17 MED ORDER — METHYLPREDNISOLONE ACETATE 40 MG/ML IJ SUSP
40.0000 mg | Freq: Once | INTRAMUSCULAR | Status: AC
  Administered 2015-04-17: 40 mg via INTRAMUSCULAR

## 2015-04-17 MED ORDER — PREDNISONE 5 MG PO TABS: 5.0000 mg | ORAL_TABLET | Freq: Two times a day (BID) | ORAL | Status: AC

## 2015-04-17 NOTE — Patient Instructions (Addendum)
F/u in 3 month, call if you  need me before  Injection and short course of medication for the rash, if not better next week, pls call, you will be referred to the dermatologist.  Use benadryl cream on the rash instead of the cream you currently have  Continue to be careful , use cane so you do not fall  Thankful you have some help at home  Fasting lipid,TSH, cbc, iron and feritin, cmp within the next 1 to 2 weeks please

## 2015-04-18 ENCOUNTER — Other Ambulatory Visit: Payer: Self-pay

## 2015-04-18 MED ORDER — MECLIZINE HCL 25 MG PO TABS: 25.0000 mg | ORAL_TABLET | Freq: Two times a day (BID) | ORAL | Status: DC | PRN

## 2015-04-21 DIAGNOSIS — F411 Generalized anxiety disorder: Secondary | ICD-10-CM | POA: Insufficient documentation

## 2015-04-21 NOTE — Assessment & Plan Note (Signed)
Weight has stabilized since last visit, stress of caring for her ill spouse is the main cause I believe

## 2015-04-21 NOTE — Assessment & Plan Note (Signed)
Controlled, no change in medication  

## 2015-04-21 NOTE — Assessment & Plan Note (Signed)
Allergic dermatitis Depo medrol in office followed by short course of prednisone, will refer to dermatology if persists

## 2015-04-21 NOTE — Assessment & Plan Note (Signed)
Controlled, no change in medication Improved with the involvement of [facility], then help in the  Home twice weekly which she desperately needs

## 2015-04-21 NOTE — Progress Notes (Signed)
Subjective:    Patient ID: Candace Cervantes, female    DOB: 03/25/1924, 81 y.o.   MRN: [id number]  HPI The PT is here for follow up and re-evaluation of chronic medical conditions, medication management and review of any available recent lab and radiology data.  Preventive health is updated, specifically  Cancer screening and Immunization.   Recently seen by gI and now has [facility] help trying to get some in home help which she desperately needs The PT denies any adverse reactions to current medications since the last visit.  C/o pruritic rash on arms and groin, like bites over the past 7 to 10 days, no fever or drainage     Review of Systems See HPI Denies recent fever or chills. Denies sinus pressure, nasal congestion, ear pain or sore throat. Denies chest congestion, productive cough or wheezing. Denies chest pains, palpitations and leg swelling Denies abdominal pain, nausea, vomiting,diarrhea or constipation.   Denies dysuria, frequency, hesitancy or incontinence. Chronic joint pain, swelling and limitation in mobility.No falls or light headedness Denies headaches, seizures, numbness, or tingling. Denies uncontrolled  depression, anxiety or insomnia.       Objective:   Physical Exam BP 130/74 mmHg  Pulse 60  Resp 16  Ht 5\' 11"  (1.803 m)  Wt 153 lb 6.4 oz (69.582 kg)  BMI 21.40 kg/m2  SpO2 98% Patient alert and oriented and in no cardiopulmonary distress.  HEENT: No facial asymmetry, EOMI,   oropharynx pink and moist.  Neck decreased ROM no JVD, no mass.  Chest: Clear to auscultation bilaterally.  CVS: S1, S2 no murmurs, no S3.Regular rate.  ABD: Soft non tender.   Ext: No edema  MS: Adequate though educed ROM spine, shoulders, hips and knees.  Skin: Intact, erythematous papular rash on arms and in groin  Psych: Good eye contact, normal affect. Memory intact not anxious or depressed appearing.  CNS: CN 2-12 intact, power,  normal throughout.no focal deficits  noted.        Assessment & Plan:  Rash and nonspecific skin eruption Allergic dermatitis Depo medrol in office followed by short course of prednisone, will refer to dermatology if persists  Essential hypertension Controlled, no change in medication   IGT (impaired glucose tolerance) Patient educated about the importance of limiting  Carbohydrate intake , the need to commit to daily physical activity for a minimum of 30 minutes , and to commit weight loss. The fact that changes in all these areas will reduce or eliminate all together the development of diabetes is stressed.  Updated lab needed at/ before next visit.   Diabetic Labs Latest Ref Rng 08/06/2014 02/06/2014 10/26/2013 07/21/2013 02/17/2013  HbA1c <5.7 % 6.1(H) - 5.6 6.1(H) 5.7(H)  Chol 0 - 200 mg/dL 151 223(H) 239(H) - -  HDL >39 mg/dL 69 82 79 - -  Calc LDL 0 - 99 mg/dL 72 130(H) 140(H) - -  Triglycerides <150 mg/dL 51 54 100 - -  Creatinine 0.50 - 1.10 mg/dL 1.22(H) 1.15(H) 1.08 1.02 1.17(H)   BP/Weight 04/17/2015 04/11/2015 03/07/2015 01/17/2015 11/14/2014 10/19/2014 92/0/1007  Systolic BP 121 975 883 254 982 641 583  Diastolic BP 74 66 60 75 72 57 78  Wt. (Lbs) 153.4 153.4 155 156.4 157 156.6 151  BMI 21.4 21.4 21.63 21.82 20.72 21.85 19.93   No flowsheet data found.     Hyperlipemia Hyperlipidemia:Low fat diet discussed and encouraged.   Lipid Panel  Lab Results  Component Value Date   CHOL 151 08/06/2014  HDL 69 08/06/2014   LDLCALC 72 08/06/2014   TRIG 51 08/06/2014   CHOLHDL 2.2 08/06/2014   Updated lab past due.      Unintended weight loss Weight has stabilized since last visit, stress of caring for her ill spouse is the main cause I believe  ANXIETY DISORDER, GENERALIZED Controlled, no change in medication   GERD Controlled, no change in medication   Depression with anxiety Controlled, no change in medication Improved with the involvement of [facility], then help in the  Home twice weekly which  she desperately needs

## 2015-04-21 NOTE — Assessment & Plan Note (Addendum)
Patient educated about the importance of limiting  Carbohydrate intake , the need to commit to daily physical activity for a minimum of 30 minutes , and to commit weight loss. The fact that changes in all these areas will reduce or eliminate all together the development of diabetes is stressed.  Updated lab needed at/ before next visit.   Diabetic Labs Latest Ref Rng 08/06/2014 02/06/2014 10/26/2013 07/21/2013 02/17/2013  HbA1c <5.7 % 6.1(H) - 5.6 6.1(H) 5.7(H)  Chol 0 - 200 mg/dL 151 223(H) 239(H) - -  HDL >39 mg/dL 69 82 79 - -  Calc LDL 0 - 99 mg/dL 72 130(H) 140(H) - -  Triglycerides <150 mg/dL 51 54 100 - -  Creatinine 0.50 - 1.10 mg/dL 1.22(H) 1.15(H) 1.08 1.02 1.17(H)   BP/Weight 04/17/2015 04/11/2015 03/07/2015 01/17/2015 11/14/2014 10/19/2014 25/01/9825  Systolic BP 415 830 940 768 088 110 315  Diastolic BP 74 66 60 75 72 57 78  Wt. (Lbs) 153.4 153.4 155 156.4 157 156.6 151  BMI 21.4 21.4 21.63 21.82 20.72 21.85 19.93   No flowsheet data found.

## 2015-04-21 NOTE — Assessment & Plan Note (Signed)
Hyperlipidemia:Low fat diet discussed and encouraged.   Lipid Panel  Lab Results  Component Value Date   CHOL 151 08/06/2014   HDL 69 08/06/2014   LDLCALC 72 08/06/2014   TRIG 51 08/06/2014   CHOLHDL 2.2 08/06/2014   Updated lab past due.

## 2015-05-03 ENCOUNTER — Other Ambulatory Visit: Payer: Self-pay

## 2015-05-03 MED ORDER — POTASSIUM CHLORIDE CRYS ER 20 MEQ PO TBCR
20.0000 meq | EXTENDED_RELEASE_TABLET | Freq: Every day | ORAL | Status: DC
Start: 2015-05-03 — End: 2016-10-26

## 2015-05-06 ENCOUNTER — Ambulatory Visit (INDEPENDENT_AMBULATORY_CARE_PROVIDER_SITE_OTHER): Payer: Medicare Other | Admitting: Internal Medicine

## 2015-05-06 ENCOUNTER — Encounter: Payer: Self-pay | Admitting: Internal Medicine

## 2015-05-06 VITALS — BP 140/90 | HR 61 | Ht 71.0 in

## 2015-05-06 DIAGNOSIS — I10 Essential (primary) hypertension: Secondary | ICD-10-CM | POA: Diagnosis not present

## 2015-05-06 DIAGNOSIS — I4891 Unspecified atrial fibrillation: Secondary | ICD-10-CM | POA: Diagnosis not present

## 2015-05-06 DIAGNOSIS — I495 Sick sinus syndrome: Secondary | ICD-10-CM | POA: Diagnosis not present

## 2015-05-06 DIAGNOSIS — I480 Paroxysmal atrial fibrillation: Secondary | ICD-10-CM | POA: Diagnosis not present

## 2015-05-06 DIAGNOSIS — Z950 Presence of cardiac pacemaker: Secondary | ICD-10-CM

## 2015-05-06 LAB — CUP PACEART INCLINIC DEVICE CHECK
Date Time Interrogation Session: 20160801141825
Lead Channel Impedance Value: 413 Ohm
Lead Channel Pacing Threshold Amplitude: 0.5 V
Lead Channel Pacing Threshold Amplitude: 1 V
Lead Channel Pacing Threshold Pulse Width: 0.5 ms
Lead Channel Sensing Intrinsic Amplitude: 6.6 mV
Lead Channel Setting Pacing Amplitude: 2.5 V
Lead Channel Setting Sensing Sensitivity: 2 mV
MDC IDC MSMT BATTERY IMPEDANCE: 2000 Ohm
MDC IDC MSMT BATTERY VOLTAGE: 2.76 V
MDC IDC MSMT LEADCHNL RA SENSING INTR AMPL: 1.3 mV
MDC IDC MSMT LEADCHNL RV IMPEDANCE VALUE: 460 Ohm
MDC IDC MSMT LEADCHNL RV PACING THRESHOLD PULSEWIDTH: 0.5 ms
MDC IDC SET LEADCHNL RA PACING AMPLITUDE: 2 V
MDC IDC SET LEADCHNL RV PACING PULSEWIDTH: 0.5 ms
MDC IDC STAT BRADY RA PERCENT PACED: 66 %
MDC IDC STAT BRADY RV PERCENT PACED: 2.7 %
Pulse Gen Model: 5816
Pulse Gen Serial Number: 1794510

## 2015-05-06 NOTE — Assessment & Plan Note (Signed)
Her [employer] DDD PM is working normally. Will recheck in several months.  

## 2015-05-06 NOTE — Patient Instructions (Signed)
Your physician wants you to follow-up in: 12 months with Dr. Lovena Le. You will receive a reminder letter in the mail two months in advance. If you don't receive a letter, please call our office to schedule the follow-up appointment.  Your physician recommends that you schedule a follow-up appointment in [facility] in 6 months.   Your physician recommends that you continue on your current medications as directed. Please refer to the Current Medication list given to you today.  Thank you for choosing [facility]!

## 2015-05-06 NOTE — Assessment & Plan Note (Signed)
She has maintained NSR 99% of the time. She is not a candidate for systemic anticoagulation due to her h/o falls.

## 2015-05-06 NOTE — Assessment & Plan Note (Signed)
Her blood pressure remains elevated. I have asked that she reduce her sodium intake.

## 2015-05-06 NOTE — Progress Notes (Signed)
HPI Candace Cervantes returns today for followup. She is a pleasant 81 yo woman with a h/o symptomatic bradycardia, s/p PPM, HTN, and CAD. In the interim, she has been stable. She denied any recent falls. No syncope or chest pain. No sob. She has lost some weight but thinks that she gets full easier.  No Known Allergies   Current Outpatient Prescriptions  Medication Sig Dispense Refill  . ALPRAZolam (XANAX) 0.25 MG tablet TAKE 1 TABLET BY MOUTH EVERY NIGHT AT BEDTIME AS NEEDED FOR ANXIETY 30 tablet 1  . aspirin 81 MG tablet Take 81 mg by mouth daily.    . busPIRone (BUSPAR) 5 MG tablet Take 1 tablet (5 mg total) by mouth 2 (two) times daily. 60 tablet 3  . Calcium Carbonate-Vitamin D (CALCIUM 600+D) 600-400 MG-UNIT per tablet Take 1 tablet by mouth daily.    . citalopram (CELEXA) 10 MG tablet Take 1 tablet (10 mg total) by mouth daily. 30 tablet 2  . fenofibrate (TRICOR) 145 MG tablet TAKE 1 TABLET BY MOUTH EVERY DAY 30 tablet 3  . meclizine (ANTIVERT) 25 MG tablet Take 1 tablet (25 mg total) by mouth 2 (two) times daily as needed. 40 tablet 0  . Multiple Vitamin (MULTIVITAMIN WITH MINERALS) TABS tablet Take 1 tablet by mouth daily.    . nitroGLYCERIN (NITROSTAT) 0.4 MG SL tablet Place 1 tablet (0.4 mg total) under the tongue every 5 (five) minutes as needed for chest pain. One tablet under the tongue at onset of chest pains, repeat every five minutes as needed 25 tablet 1  . omeprazole (PRILOSEC) 20 MG capsule Take 1 capsule (20 mg total) by mouth daily. 90 capsule 1  . potassium chloride SA (KLOR-CON M20) 20 MEQ tablet Take 1 tablet (20 mEq total) by mouth daily. 30 tablet 5  . pravastatin (PRAVACHOL) 40 MG tablet Take 1 tablet (40 mg total) by mouth daily. 30 tablet 5  . triamterene-hydrochlorothiazide (MAXZIDE-25) 37.5-25 MG per tablet TAKE 1 1/2 TABLET BY MOUTH DAILY 45 tablet 1   No current facility-administered medications for this visit.     Past Medical History  Diagnosis Date  .  Pacemaker 2002    Permanent Placement  . SSS (sick sinus syndrome)   . Pacemaker 2007    Generator Changed   . Hyperlipidemia   . Hypertension   . CAD (coronary artery disease) 2005    Nonobstructive 40% RCA lesion and normal ejection fraction at cath   . Gastroesophageal reflux disease   . Osteoporosis   . Anxiety disorder   . Osteoarthritis   . Sinoatrial node dysfunction   . Zenker diverticulum     previously evaluated by Dr. Erik Obey in 1610.     ROS:   All systems reviewed and negative except as noted in the HPI.   Past Surgical History  Procedure Laterality Date  . Total abdominal hysterectomy  2002  . Cholecystectomy    . Cystectomy      from back of the neck  . Pacemaker insertion  2002  . Pacemaker placement  2007    replacement   . Esophagogastroduodenoscopy  2008    Dr. Oneida Alar, difficulty passing scope through UES, incomplete fibrous ring at GEJ, hh, multiple benign gastri polyps  . Colonoscopy  2003    no polyps.  . Esophagogastroduodenoscopy N/A 10/17/2013    ZENKER'S, STRICTURE: 10-12.8 MM,  NSAID GASTRITIS FG POLYPS  . Savory dilation N/A 10/17/2013    Procedure: SAVORY DILATION;  Surgeon: Danie Binder,  MD;  [facility];  Service: Endoscopy;  Laterality: N/A;  with PEDS GASTROSCOPE  . Maloney dilation N/A 10/17/2013    Procedure: Venia Minks DILATION;  Surgeon: Danie Binder, MD;  [facility];  Service: Endoscopy;  Laterality: N/A;  with PEDS GASTROSCOPE  . Esophagogastroduodenoscopy (egd) with esophageal dilation N/A 10/30/2013    SLF: 1. Zenkers diverticulum with a large opening at the cricopharyngeus 2. 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 Innumerable Fundic gland polyps 4. Mild NSAID gastritis     Family History  Problem Relation Age of Onset  . Cancer Brother     prostate  . Heart disease Mother   . Colon cancer Other     sibling, age 33  . Alcohol abuse Sister   . Lung disease Sister      History   Social History  .  Marital Status: Married    Spouse Name: N/A  . Number of Children: 2  . Years of Education: N/A   Occupational History  . Not on file.   Social History Main Topics  . Smoking status: Never Smoker   . Smokeless tobacco: Never Used  . Alcohol Use: No  . Drug Use: No  . Sexual Activity: Not on file   Other Topics Concern  . Not on file   Social History Narrative     BP 140/90 mmHg  Pulse 61  Ht 5\' 11"  (1.803 m)  Wt   SpO2 98%  Physical Exam:  Well appearing 81 yo woman, NAD HEENT: Unremarkable Neck:  7 cm JVD, no thyromegally Back:  No CVA tenderness Lungs:  Clear with no wheezes, rales, or rhonchi HEART:  Regular rate rhythm, no murmurs, no rubs, no clicks Abd:  soft, positive bowel sounds, no organomegally, no rebound, no guarding Ext:  2 plus pulses, no edema, no cyanosis, no clubbing Skin:  No rashes no nodules Neuro:  CN II through XII intact, motor grossly intact  ECG - NSR with atrial pacing  DEVICE  Normal device function.  See PaceArt for details.   Assess/Plan:

## 2015-05-10 ENCOUNTER — Other Ambulatory Visit: Payer: Self-pay | Admitting: Family Medicine

## 2015-05-10 ENCOUNTER — Other Ambulatory Visit: Payer: Self-pay

## 2015-05-10 MED ORDER — TRIAMTERENE-HCTZ 37.5-25 MG PO TABS: ORAL_TABLET | ORAL | Status: DC

## 2015-05-21 ENCOUNTER — Encounter: Payer: Self-pay | Admitting: Gastroenterology

## 2015-05-21 ENCOUNTER — Ambulatory Visit (INDEPENDENT_AMBULATORY_CARE_PROVIDER_SITE_OTHER): Payer: Medicare Other | Admitting: Gastroenterology

## 2015-05-21 VITALS — BP 104/58 | HR 61 | Temp 97.9°F | Ht 71.0 in | Wt 151.8 lb

## 2015-05-21 DIAGNOSIS — R1310 Dysphagia, unspecified: Secondary | ICD-10-CM

## 2015-05-21 DIAGNOSIS — R634 Abnormal weight loss: Secondary | ICD-10-CM

## 2015-05-21 DIAGNOSIS — K5901 Slow transit constipation: Secondary | ICD-10-CM

## 2015-05-21 DIAGNOSIS — K219 Gastro-esophageal reflux disease without esophagitis: Secondary | ICD-10-CM | POA: Diagnosis not present

## 2015-05-21 NOTE — Progress Notes (Signed)
Subjective:    Patient ID: Candace Cervantes, female    DOB: 03/25/1924, 81 y.o.   MRN: [id number]  Candace Nakayama, MD  HPI WEIGHT down 1-2 lbs since JUL 2016. DRINKS CIB-ONCE A DAY. ABLE TO EAT. DOESN'T GET CHOKED EVERY TIME. GETS BAD WITH COUGHING SPELLS-2-3X/WEEK. DOESN'T LIKE TO GO OUT TO EAT. BMs: OK. APPETITE: GOOD. WALKS AND HOLD HER STOMACH, BUT DOESN'T HURT. HEARTBURN: CONTROLLED.   PT DENIES FEVER, CHILLS, HEMATOCHEZIA, nausea, vomiting, melena, diarrhea, CHEST PAIN, SHORTNESS OF BREATH,  CHANGE IN BOWEL IN HABITS, constipation, abdominal pain, OR heartburn or indigestion.   Past Medical History  Diagnosis Date  . Pacemaker 2002    Permanent Placement  . SSS (sick sinus syndrome)   . Pacemaker 2007    Generator Changed   . Hyperlipidemia   . Hypertension   . CAD (coronary artery disease) 2005    Nonobstructive 40% RCA lesion and normal ejection fraction at cath   . Gastroesophageal reflux disease   . Osteoporosis   . Anxiety disorder   . Osteoarthritis   . Sinoatrial node dysfunction   . Zenker diverticulum     previously evaluated by Dr. Erik Obey in 0263.    Past Surgical History  Procedure Laterality Date  . Total abdominal hysterectomy  2002  . Cholecystectomy    . Cystectomy      from back of the neck  . Pacemaker insertion  2002  . Pacemaker placement  2007    replacement   . Esophagogastroduodenoscopy  2008    Dr. Oneida Alar, difficulty passing scope through UES, incomplete fibrous ring at GEJ, hh, multiple benign gastri polyps  . Colonoscopy  2003    no polyps.  . Esophagogastroduodenoscopy N/A 10/17/2013    ZENKER'S, STRICTURE: 10-12.8 MM,  NSAID GASTRITIS FG POLYPS  . Savory dilation N/A 10/17/2013    Procedure: SAVORY DILATION;  Surgeon: Danie Binder, MD;  [facility];  Service: Endoscopy;  Laterality: N/A;  with PEDS GASTROSCOPE  . Maloney dilation N/A 10/17/2013    Procedure: Venia Minks DILATION;  Surgeon: Danie Binder, MD;  [facility];  Service: Endoscopy;  Laterality: N/A;  with PEDS GASTROSCOPE  . Esophagogastroduodenoscopy (egd) with esophageal dilation N/A 10/30/2013    SLF: 1. Zenkers diverticulum with a large opening at the cricopharyngeus 2. 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 Innumerable Fundic gland polyps 4. Mild NSAID gastritis    No Known Allergies  Current Outpatient Prescriptions  Medication Sig Dispense Refill  . ALPRAZolam (XANAX) 0.25 MG tablet TAKE 1 TABLET BY MOUTH EVERY NIGHT AT BEDTIME AS NEEDED FOR ANXIETY    . aspirin 81 MG tablet Take 81 mg by mouth daily.    . busPIRone (BUSPAR) 5 MG tablet Take 1 tablet (5 mg total) by mouth 2 (two) times daily.    . Calcium Carbonate-Vitamin D (CALCIUM 600+D) 600-400 MG-UNIT per tablet Take 1 tablet by mouth daily.    . citalopram (CELEXA) 10 MG tablet Take 1 tablet (10 mg total) by mouth daily.    . fenofibrate (TRICOR) 145 MG tablet TAKE 1 TABLET BY MOUTH EVERY DAY    . meclizine (ANTIVERT) 25 MG tablet Take 1 tablet (25 mg total) by mouth 2 (two) times daily as needed.    . Multiple Vitamin (MULTIVITAMIN WITH MINERALS) TABS tablet Take 1 tablet by mouth daily.    . nitroGLYCERIN (NITROSTAT) 0.4 MG SL tablet Place 1 tablet (0.4 mg total) under the tongue every 5 (five) minutes as  needed for chest pain. One tablet under the tongue at onset of chest pains, repeat every five minutes as needed    . omeprazole (PRILOSEC) 20 MG capsule Take 1 capsule (20 mg total) by mouth daily.    . potassium chloride SA (KLOR-CON M20) 20 MEQ tablet Take 1 tablet (20 mEq total) by mouth daily.    . pravastatin (PRAVACHOL) 40 MG tablet Take 1 tablet (40 mg total) by mouth daily.    Marland Kitchen triamterene-hydrochlorothiazide (MAXZIDE-25) 37.5-25 MG per tablet TAKE 1 AND 1/2 TABLETS BY MOUTH DAILY       Review of Systems PER HPI OTHERWISE ALL SYSTEMS ARE NEGATIVE.     Objective:   Physical Exam  Constitutional: She is oriented to person, place, and time. She appears  well-developed and well-nourished. No distress.  HENT:  Head: Normocephalic and atraumatic.  Mouth/Throat: Oropharynx is clear and moist. No oropharyngeal exudate.  Eyes: Pupils are equal, round, and reactive to light. No scleral icterus.  Neck: Normal range of motion. Neck supple.  Cardiovascular: Normal rate, regular rhythm and normal heart sounds.   Pulmonary/Chest: Effort normal and breath sounds normal. No respiratory distress.  Abdominal: Soft. Bowel sounds are normal. She exhibits no distension. There is no tenderness.  Musculoskeletal: She exhibits no edema.  Lymphadenopathy:    She has no cervical adenopathy.  Neurological: She is alert and oriented to person, place, and time.  NO  NEW FOCAL DEFICITS   Psychiatric: She has a normal mood and affect.  Vitals reviewed.         Assessment & Plan:

## 2015-05-21 NOTE — Progress Notes (Signed)
cc'ed to pcp °

## 2015-05-21 NOTE — Assessment & Plan Note (Signed)
SYMPTOMS NOT IDEALLY CONTROLLED. PT WOUL DLIKE HER SWALLOWING IMPROVED.   SPOKE WITH AND confirmed appt with DR. TEOH'S [facility] for THUR AUG 18-2 PM CONTINUE TO MODIFY DIET CONTINUE CARNATION INSTANT BREAKFAST FOLLOW UP IN 4 MOS.    GREATER THAN 50% WAS SPENT IN COUNSELING & COORDINATION OF CARE WITH THE PATIENT: DISCUSSED ONLY OPTION FOR IMPROVING SWALLOWING IS SURGERY, AND MANAGEMENT OF ZENKER'S DIVERTICULUM. TOTAL ENCOUNTER TIME: 25 MINS.

## 2015-05-21 NOTE — Assessment & Plan Note (Signed)
SYMPTOMS CONTROLLED/RESOLVED.  CONTINUE TO MONITOR SYMPTOMS. 

## 2015-05-21 NOTE — Progress Notes (Signed)
ON RECALL  °

## 2015-05-21 NOTE — Patient Instructions (Signed)
I SPOKE WITH AND confirmed YOUR APPOINTMENT with DR. Deeann Saint [facility] for THUR AUG 18-2 PM IN [place].  CONTINUE TO MODIFY YOUR DIET.  CONTINUE CARNATION INSTANT BREAKFAST ONE OR TWO TIMES A DAY.  FOLLOW UP IN 4 MOS.

## 2015-05-21 NOTE — Assessment & Plan Note (Signed)
LOST 1-2 MORE LBS.  INCREASE CIB TO 2 TIMES A DAY. CONTINUE TO MONITOR WEIGHT.

## 2015-05-23 ENCOUNTER — Ambulatory Visit (INDEPENDENT_AMBULATORY_CARE_PROVIDER_SITE_OTHER): Payer: Medicare Other | Admitting: Otolaryngology

## 2015-05-23 DIAGNOSIS — R1314 Dysphagia, pharyngoesophageal phase: Secondary | ICD-10-CM

## 2015-05-29 ENCOUNTER — Encounter: Payer: Self-pay | Admitting: Internal Medicine

## 2015-05-30 ENCOUNTER — Other Ambulatory Visit: Payer: Self-pay | Admitting: Family Medicine

## 2015-06-13 ENCOUNTER — Ambulatory Visit (INDEPENDENT_AMBULATORY_CARE_PROVIDER_SITE_OTHER): Payer: Medicare Other

## 2015-06-13 DIAGNOSIS — Z23 Encounter for immunization: Secondary | ICD-10-CM | POA: Diagnosis not present

## 2015-06-17 ENCOUNTER — Telehealth: Payer: Self-pay | Admitting: *Deleted

## 2015-06-17 ENCOUNTER — Telehealth: Payer: Self-pay

## 2015-06-17 DIAGNOSIS — E785 Hyperlipidemia, unspecified: Secondary | ICD-10-CM | POA: Diagnosis not present

## 2015-06-17 DIAGNOSIS — I10 Essential (primary) hypertension: Secondary | ICD-10-CM | POA: Diagnosis not present

## 2015-06-17 DIAGNOSIS — R7309 Other abnormal glucose: Secondary | ICD-10-CM | POA: Diagnosis not present

## 2015-06-17 DIAGNOSIS — D638 Anemia in other chronic diseases classified elsewhere: Secondary | ICD-10-CM | POA: Diagnosis not present

## 2015-06-17 DIAGNOSIS — R7303 Prediabetes: Secondary | ICD-10-CM

## 2015-06-17 NOTE — Telephone Encounter (Signed)
Pt called [facility] stating she had a reaction last night and she is sick, pt said she feels better today patient is requesting to see Dr. Moshe Cipro this afternoon pt said she will be home after 12 and pt is requesting a call back.

## 2015-06-17 NOTE — Telephone Encounter (Signed)
See next telephone message. 

## 2015-06-17 NOTE — Telephone Encounter (Signed)
Noted that patient is behind on labs.  No available appointments for today.  Patient will come and have labs drawn today.

## 2015-06-18 LAB — CBC WITH DIFFERENTIAL/PLATELET
BASOS PCT: 0 % (ref 0–1)
Basophils Absolute: 0 10*3/uL (ref 0.0–0.1)
EOS PCT: 1 % (ref 0–5)
Eosinophils Absolute: 0 10*3/uL (ref 0.0–0.7)
HCT: 33.5 % — ABNORMAL LOW (ref 36.0–46.0)
HEMOGLOBIN: 11.1 g/dL — AB (ref 12.0–15.0)
Lymphocytes Relative: 46 % (ref 12–46)
Lymphs Abs: 1.7 10*3/uL (ref 0.7–4.0)
MCH: 30.6 pg (ref 26.0–34.0)
MCHC: 33.1 g/dL (ref 30.0–36.0)
MCV: 92.3 fL (ref 78.0–100.0)
MONO ABS: 0.2 10*3/uL (ref 0.1–1.0)
MPV: 9.5 fL (ref 8.6–12.4)
Monocytes Relative: 5 % (ref 3–12)
NEUTROS ABS: 1.8 10*3/uL (ref 1.7–7.7)
Neutrophils Relative %: 48 % (ref 43–77)
Platelets: 194 10*3/uL (ref 150–400)
RBC: 3.63 MIL/uL — AB (ref 3.87–5.11)
RDW: 14.6 % (ref 11.5–15.5)
WBC: 3.8 10*3/uL — ABNORMAL LOW (ref 4.0–10.5)

## 2015-06-18 LAB — COMPREHENSIVE METABOLIC PANEL
ALT: 14 U/L (ref 6–29)
AST: 22 U/L (ref 10–35)
Albumin: 3.9 g/dL (ref 3.6–5.1)
Alkaline Phosphatase: 46 U/L (ref 33–130)
BUN: 18 mg/dL (ref 7–25)
CHLORIDE: 101 mmol/L (ref 98–110)
CO2: 30 mmol/L (ref 20–31)
Calcium: 9.7 mg/dL (ref 8.6–10.4)
Creat: 1.04 mg/dL — ABNORMAL HIGH (ref 0.60–0.88)
GLUCOSE: 99 mg/dL (ref 65–99)
Potassium: 3.8 mmol/L (ref 3.5–5.3)
Sodium: 141 mmol/L (ref 135–146)
Total Bilirubin: 0.4 mg/dL (ref 0.2–1.2)
Total Protein: 6.8 g/dL (ref 6.1–8.1)

## 2015-06-18 LAB — LIPID PANEL
Cholesterol: 181 mg/dL (ref 125–200)
HDL: 81 mg/dL (ref >=46)
LDL CALC: 86 mg/dL (ref <130)
Total CHOL/HDL Ratio: 2.2 Ratio (ref <=5.0)
Triglycerides: 71 mg/dL (ref <150)
VLDL: 14 mg/dL (ref <30)

## 2015-06-18 LAB — IRON: IRON: 87 ug/dL (ref 45–160)

## 2015-06-18 LAB — FERRITIN: FERRITIN: 116 ng/mL (ref 10–291)

## 2015-06-18 LAB — HEMOGLOBIN A1C
Hgb A1c MFr Bld: 6 % — ABNORMAL HIGH (ref <5.7)
Mean Plasma Glucose: 126 mg/dL — ABNORMAL HIGH (ref <117)

## 2015-07-05 ENCOUNTER — Other Ambulatory Visit: Payer: Self-pay | Admitting: Family Medicine

## 2015-07-16 ENCOUNTER — Ambulatory Visit (INDEPENDENT_AMBULATORY_CARE_PROVIDER_SITE_OTHER): Payer: Medicare Other | Admitting: Gastroenterology

## 2015-07-16 ENCOUNTER — Encounter: Payer: Self-pay | Admitting: Gastroenterology

## 2015-07-16 VITALS — BP 127/66 | HR 61 | Temp 98.4°F | Ht 71.0 in | Wt 157.2 lb

## 2015-07-16 DIAGNOSIS — K225 Diverticulum of esophagus, acquired: Secondary | ICD-10-CM | POA: Diagnosis not present

## 2015-07-16 NOTE — Patient Instructions (Signed)
I WILL REFER YOU TO THE [facility], NOSE, AND THROAT DOCTORS. CALL ME IF YOU HAVE NOT HEARD FROM THEM BY FRI OCT 14.  CONTINUE A SOFT MECHANICAL DIET. MEATS SHOULD BE CHOPPED OR GROUND ONLY. DO NOT EAT CHUNKS OF ANYTHING.  CONTINUE CARNATION INSTANT BREAKFAST ONE OR TWO TIMES A DAY.  FOLLOW UP IN 4 MOS.

## 2015-07-16 NOTE — Progress Notes (Signed)
cc'ed to pcp °

## 2015-07-16 NOTE — Assessment & Plan Note (Signed)
CHRONIC DYSPHAGIA WITH SYMPTOMS NOT IDEALLY CONTROLLED, BUT PT GAINED WEIGHT. Dr. Benjamine Mola cannot help pt.  REFER TO [facility]. CONTINUE SOFT MECH DIET. SUPPLEMENTS 2-3 TIMES DAILY.  FOLLOW UP IN 4 MOS.

## 2015-07-16 NOTE — Progress Notes (Signed)
Subjective:    Patient ID: Candace Cervantes, female    DOB: 03/25/1924, 81 y.o.   MRN: [id number]  Candace Nakayama, MD   HPI LAST SEEN AUG 2016. SAW DR. Benjamine Cervantes. HE CAN'T DO SURGERY. RECOMMEND PT GO TO [facility] OR [facility]. SWALLOWING HAS GOOD AND BAD DAYS. WEIGHT UP 6 LBS SINCE AUG 2016. HAD A SPELL WEEK BEFORE LAST: NERVES/REACHED UP AND FELT SICK/. HUSBAND HAS JUST BEEN DIAGNOSED WITH BLOOD CANCER.  PT DENIES FEVER, CHILLS, HEMATOCHEZIA, nausea, vomiting, melena, diarrhea, CHEST PAIN, SHORTNESS OF BREATH,  CHANGE IN BOWEL IN HABITS, constipation, abdominal pain, problems swallowing, problems with sedation, heartburn or indigestion.   Past Medical History  Diagnosis Date  . Pacemaker 2002    Permanent Placement  . SSS (sick sinus syndrome)   . Pacemaker 2007    Generator Changed   . Hyperlipidemia   . Hypertension   . CAD (coronary artery disease) 2005    Nonobstructive 40% RCA lesion and normal ejection fraction at cath   . Gastroesophageal reflux disease   . Osteoporosis   . Anxiety disorder   . Osteoarthritis   . Sinoatrial node dysfunction   . Zenker diverticulum     previously evaluated by Dr. Erik Cervantes in [id number].    Past Surgical History  Procedure Laterality Date  . Total abdominal hysterectomy  2002  . Cholecystectomy    . Cystectomy      from back of the neck  . Pacemaker insertion  2002  . Pacemaker placement  2007    replacement   . Esophagogastroduodenoscopy  2008    Dr. Oneida Cervantes, difficulty passing scope through UES, incomplete fibrous ring at GEJ, hh, multiple benign gastri polyps  . Colonoscopy  2003    no polyps.  . Esophagogastroduodenoscopy N/A 10/17/2013    ZENKER'S, STRICTURE: 10-12.8 MM,  NSAID GASTRITIS FG POLYPS  . Savory dilation N/A 10/17/2013    Procedure: SAVORY DILATION;  Surgeon: Candace Binder, MD;  [facility];  Service: Endoscopy;  Laterality: N/A;  with PEDS GASTROSCOPE  . Maloney dilation N/A 10/17/2013    Procedure: Candace Cervantes DILATION;   Surgeon: Candace Binder, MD;  Location: [facility];  Service: Endoscopy;  Laterality: N/A;  with PEDS GASTROSCOPE  . Esophagogastroduodenoscopy (egd) with esophageal dilation N/A 10/30/2013    SLF: 1. Zenkers diverticulum with a large opening at the cricopharyngeus 2. 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 Innumerable Fundic gland polyps 4. Mild NSAID gastritis   No Known Allergies  Current Outpatient Prescriptions  Medication Sig Dispense Refill  . ALPRAZolam (XANAX) 0.25 MG tablet TAKE 1 TABLET BY MOUTH EVERY NIGHT AT BEDTIME AS NEEDED FOR ANXIETY 30 tablet 0  . aspirin 81 MG tablet Take 81 mg by mouth daily.    . busPIRone (BUSPAR) 5 MG tablet Take 1 tablet (5 mg total) by mouth 2 (two) times daily.    . Calcium Carbonate-Vitamin D (CALCIUM 600+D) 600-400 MG-UNIT per tablet Take 1 tablet by mouth daily.    . citalopram (CELEXA) 10 MG tablet TAKE 1 TABLET BY MOUTH EVERY DAY    . fenofibrate (TRICOR) 145 MG tablet TAKE 1 TABLET BY MOUTH EVERY DAY    . meclizine (ANTIVERT) 25 MG tablet TAKE 1 TABLET BY MOUTH TWICE DAILY AS NEEDED    . Multiple Vitamin (MULTIVITAMIN WITH MINERALS) TABS tablet Take 1 tablet by mouth daily.    . nitroGLYCERIN (NITROSTAT) 0.4 MG SL tablet Place 1 tablet SL every 5 (five) minutes as needed for chest  pain.     . omeprazole (PRILOSEC) 20 MG capsule Take 1 capsule (20 mg total) by mouth daily.    . potassium chloride SA (KLOR-CON M20) 20 MEQ tablet Take 1 tablet (20 mEq total) by mouth daily.    . pravastatin (PRAVACHOL) 40 MG tablet TAKE 1 TABLET BY MOUTH EVERY DAY    . triamterene-hydrochlorothiazide (MAXZIDE-25) 37.5-25 MG per tablet TAKE 1 AND 1/2 TABLETS BY MOUTH DAILY     Review of Systems PER HPI OTHERWISE ALL SYSTEMS ARE NEGATIVE.    Objective:   Physical Exam  Constitutional: She is oriented to person, place, and time. She appears well-developed and well-nourished. No distress.  HENT:  Head: Normocephalic and atraumatic.  Mouth/Throat: Oropharynx is  clear and moist. No oropharyngeal exudate.  Eyes: Pupils are equal, round, and reactive to light. No scleral icterus.  Neck: Normal range of motion. Neck supple.  Cardiovascular: Normal rate, regular rhythm and normal heart sounds.   Pulmonary/Chest: Effort normal and breath sounds normal. No respiratory distress.  Abdominal: Soft. Bowel sounds are normal. She exhibits no distension. There is no tenderness.  Musculoskeletal: She exhibits edema (TRACE BIL LEs).  Lymphadenopathy:    She has no cervical adenopathy.  Neurological: She is alert and oriented to person, place, and time.  NO  NEW FOCAL DEFICITS  Psychiatric: She has a normal mood and affect.  Vitals reviewed.         Assessment & Plan:

## 2015-07-16 NOTE — Progress Notes (Signed)
ON RECALL  °

## 2015-08-05 DIAGNOSIS — Z90710 Acquired absence of both cervix and uterus: Secondary | ICD-10-CM | POA: Diagnosis not present

## 2015-08-05 DIAGNOSIS — Z9889 Other specified postprocedural states: Secondary | ICD-10-CM | POA: Diagnosis not present

## 2015-08-05 DIAGNOSIS — I10 Essential (primary) hypertension: Secondary | ICD-10-CM | POA: Diagnosis not present

## 2015-08-05 DIAGNOSIS — K225 Diverticulum of esophagus, acquired: Secondary | ICD-10-CM | POA: Diagnosis not present

## 2015-08-05 DIAGNOSIS — Z79899 Other long term (current) drug therapy: Secondary | ICD-10-CM | POA: Diagnosis not present

## 2015-08-07 ENCOUNTER — Encounter: Payer: Self-pay | Admitting: Family Medicine

## 2015-08-07 ENCOUNTER — Ambulatory Visit (INDEPENDENT_AMBULATORY_CARE_PROVIDER_SITE_OTHER): Payer: Medicare Other | Admitting: Family Medicine

## 2015-08-07 VITALS — BP 118/64 | HR 57 | Resp 16 | Ht 71.0 in | Wt 159.0 lb

## 2015-08-07 DIAGNOSIS — J3089 Other allergic rhinitis: Secondary | ICD-10-CM

## 2015-08-07 DIAGNOSIS — Z1211 Encounter for screening for malignant neoplasm of colon: Secondary | ICD-10-CM

## 2015-08-07 DIAGNOSIS — R1030 Lower abdominal pain, unspecified: Secondary | ICD-10-CM | POA: Diagnosis not present

## 2015-08-07 DIAGNOSIS — I10 Essential (primary) hypertension: Secondary | ICD-10-CM

## 2015-08-07 DIAGNOSIS — R634 Abnormal weight loss: Secondary | ICD-10-CM

## 2015-08-07 DIAGNOSIS — E785 Hyperlipidemia, unspecified: Secondary | ICD-10-CM

## 2015-08-07 DIAGNOSIS — K225 Diverticulum of esophagus, acquired: Secondary | ICD-10-CM

## 2015-08-07 DIAGNOSIS — R109 Unspecified abdominal pain: Secondary | ICD-10-CM | POA: Insufficient documentation

## 2015-08-07 DIAGNOSIS — F418 Other specified anxiety disorders: Secondary | ICD-10-CM

## 2015-08-07 DIAGNOSIS — R7302 Impaired glucose tolerance (oral): Secondary | ICD-10-CM

## 2015-08-07 LAB — POC HEMOCCULT BLD/STL (OFFICE/1-CARD/DIAGNOSTIC): Fecal Occult Blood, POC: NEGATIVE

## 2015-08-07 MED ORDER — FLUTICASONE PROPIONATE 50 MCG/ACT NA SUSP: 2.0000 | Freq: Every day | NASAL | Status: DC

## 2015-08-07 NOTE — Patient Instructions (Addendum)
Annual wellness Feb 14 or after, call if you need me sooner  Start use of flonase, allergy nose spray sent in regularly as long as you are having increased drainage down your throat  All the best with procedure in December at [facility]  You are referred for a scan of your abdomen and pelvis due to your pain which is worsening    Thanks for choosing [facility], we consider it a privelige to serve you.  Fasting lipid, cmp and eGFr amnd HBa1C  For Feb appt 

## 2015-08-07 NOTE — Progress Notes (Signed)
   Subjective:    Patient ID: Candace Cervantes, female    DOB: 05/15/1925, 81 y.o.   MRN: [id number]  HPI  The PT is here for follow up and re-evaluation of chronic medical conditions, medication management and review of any available recent lab and radiology data.  Preventive health is updated, specifically  Cancer screening and Immunization.   Questions or concerns regarding consultations or procedures which the PT has had in the interim are  Addressed.Has GI procedure scheduled at [facility] in Dec for dysphagia C/o worsening and persistent LLQ pain, which at times disturbs her sleep      Review of Systems See HPI Denies recent fever or chills. Denies sinus pressure, nasal congestion, ear pain or sore throat. Denies chest congestion, productive cough or wheezing. Denies chest pains, palpitations and leg swelling  Denies dysuria, frequency, hesitancy or incontinence. Denies uncontrolled joint pain, swelling and limitation in mobility. Denies headaches, seizures, numbness, or tingling. Denies uncontrolled  depression, anxiety or insomnia.continues to worry and be stressed caring for her spouse with dementia, but also states that without him, she would have "nothing to do" Denies skin break down or rash.        Objective:   Physical Exam BP 118/64 mmHg  Pulse 57  Resp 16  Ht 5\' 11"  (1.803 m)  Wt 159 lb (72.122 kg)  BMI 22.19 kg/m2  SpO2 98% Patient alert and oriented and in no cardiopulmonary distress.  HEENT: No facial asymmetry, EOMI,   oropharynx pink and moist.  Neck supple no JVD, no mass.  Chest: Clear to auscultation bilaterally.  CVS: S1, S2 no murmurs, no S3.Regular rate.  ABD: Soft , left lower quadrant tenderness with guarding, no rebound. Normal BS. No palpable organomegaly or mass.  Rectal: no mass, heme negative stool  Ext: No edema  MS: Adequate though reduced  ROM spine, shoulders, hips and knees.  Skin: Intact, no ulcerations or rash  noted.  Psych: Good eye contact, normal affect. Memory intact not anxious or depressed appearing.  CNS: CN 2-12 intact, power,  normal throughout.no focal deficits noted.        Assessment & Plan:  Allergic rhinitis Uncontrolled, increased symptoms in past 1 month  Abdominal pain 1 year h/o progressively worsening LLQ pain, at times disturbing sleep. Abnormal exam, with guarding and tenderness in area of concern, needs imaging study  Unintended weight loss unintentional weight loss and worsening , localized LLQ pain, ct abdomen asap  Essential hypertension Controlled, no change in medication   ZENKER'S DIVERTICULUM Has procedure planned for December at [facility] to address this problem  Hyperlipemia Hyperlipidemia:Low fat diet discussed and encouraged.   Lipid Panel  Lab Results  Component Value Date   CHOL 181 06/17/2015   HDL 81 06/17/2015   LDLCALC 86 06/17/2015   TRIG 71 06/17/2015   CHOLHDL 2.2 06/17/2015   Controlled, no change in medication      Depression with anxiety Controlled, no change in medication

## 2015-08-07 NOTE — Assessment & Plan Note (Signed)
Uncontrolled, increased symptoms in past 1 month

## 2015-08-11 NOTE — Assessment & Plan Note (Signed)
Controlled, no change in medication  

## 2015-08-11 NOTE — Assessment & Plan Note (Signed)
Has procedure planned for December at [facility] to address this problem

## 2015-08-11 NOTE — Assessment & Plan Note (Signed)
unintentional weight loss and worsening , localized LLQ pain, ct abdomen asap

## 2015-08-11 NOTE — Assessment & Plan Note (Signed)
1 year h/o progressively worsening LLQ pain, at times disturbing sleep. Abnormal exam, with guarding and tenderness in area of concern, needs imaging study

## 2015-08-11 NOTE — Assessment & Plan Note (Signed)
Hyperlipidemia:Low fat diet discussed and encouraged.   Lipid Panel  Lab Results  Component Value Date   CHOL 181 06/17/2015   HDL 81 06/17/2015   LDLCALC 86 06/17/2015   TRIG 71 06/17/2015   CHOLHDL 2.2 06/17/2015   Controlled, no change in medication

## 2015-08-15 ENCOUNTER — Other Ambulatory Visit: Payer: Self-pay

## 2015-08-15 ENCOUNTER — Ambulatory Visit (HOSPITAL_COMMUNITY)
Admission: RE | Admit: 2015-08-15 | Discharge: 2015-08-15 | Disposition: A | Discharge status: Home / Self Care | Payer: Medicare Other | Source: Ambulatory Visit | Attending: Family Medicine | Admitting: Family Medicine

## 2015-08-15 DIAGNOSIS — R1032 Left lower quadrant pain: Secondary | ICD-10-CM | POA: Diagnosis not present

## 2015-08-15 DIAGNOSIS — R1031 Right lower quadrant pain: Secondary | ICD-10-CM | POA: Diagnosis not present

## 2015-08-15 DIAGNOSIS — I10 Essential (primary) hypertension: Secondary | ICD-10-CM | POA: Insufficient documentation

## 2015-08-15 DIAGNOSIS — Z90710 Acquired absence of both cervix and uterus: Secondary | ICD-10-CM | POA: Insufficient documentation

## 2015-08-15 DIAGNOSIS — R1030 Lower abdominal pain, unspecified: Secondary | ICD-10-CM

## 2015-08-15 DIAGNOSIS — Z1231 Encounter for screening mammogram for malignant neoplasm of breast: Secondary | ICD-10-CM

## 2015-08-15 DIAGNOSIS — Z9049 Acquired absence of other specified parts of digestive tract: Secondary | ICD-10-CM | POA: Diagnosis not present

## 2015-08-15 LAB — POCT I-STAT CREATININE: Creatinine, Ser: 1.1 mg/dL — ABNORMAL HIGH (ref 0.44–1.00)

## 2015-08-15 MED ORDER — IOHEXOL 300 MG/ML  SOLN
100.0000 mL | Freq: Once | INTRAMUSCULAR | Status: AC | PRN
  Administered 2015-08-15: 100 mL via INTRAVENOUS

## 2015-08-23 DIAGNOSIS — H02831 Dermatochalasis of right upper eyelid: Secondary | ICD-10-CM | POA: Diagnosis not present

## 2015-08-23 DIAGNOSIS — Z961 Presence of intraocular lens: Secondary | ICD-10-CM | POA: Diagnosis not present

## 2015-08-23 DIAGNOSIS — H40013 Open angle with borderline findings, low risk, bilateral: Secondary | ICD-10-CM | POA: Diagnosis not present

## 2015-08-23 DIAGNOSIS — H02834 Dermatochalasis of left upper eyelid: Secondary | ICD-10-CM | POA: Diagnosis not present

## 2015-08-23 DIAGNOSIS — H04123 Dry eye syndrome of bilateral lacrimal glands: Secondary | ICD-10-CM | POA: Diagnosis not present

## 2015-09-03 ENCOUNTER — Other Ambulatory Visit: Payer: Self-pay | Admitting: Family Medicine

## 2015-09-13 DIAGNOSIS — I10 Essential (primary) hypertension: Secondary | ICD-10-CM | POA: Diagnosis not present

## 2015-09-13 DIAGNOSIS — R1310 Dysphagia, unspecified: Secondary | ICD-10-CM | POA: Diagnosis not present

## 2015-09-13 DIAGNOSIS — Z79899 Other long term (current) drug therapy: Secondary | ICD-10-CM | POA: Diagnosis not present

## 2015-09-13 DIAGNOSIS — I208 Other forms of angina pectoris: Secondary | ICD-10-CM | POA: Diagnosis not present

## 2015-09-13 DIAGNOSIS — K225 Diverticulum of esophagus, acquired: Secondary | ICD-10-CM | POA: Diagnosis not present

## 2015-09-13 DIAGNOSIS — E785 Hyperlipidemia, unspecified: Secondary | ICD-10-CM | POA: Diagnosis not present

## 2015-09-13 DIAGNOSIS — F419 Anxiety disorder, unspecified: Secondary | ICD-10-CM | POA: Insufficient documentation

## 2015-09-13 DIAGNOSIS — K219 Gastro-esophageal reflux disease without esophagitis: Secondary | ICD-10-CM | POA: Diagnosis not present

## 2015-09-13 DIAGNOSIS — R079 Chest pain, unspecified: Secondary | ICD-10-CM | POA: Diagnosis not present

## 2015-09-13 DIAGNOSIS — I2089 Other forms of angina pectoris: Secondary | ICD-10-CM | POA: Insufficient documentation

## 2015-09-13 DIAGNOSIS — R05 Cough: Secondary | ICD-10-CM | POA: Diagnosis not present

## 2015-09-14 ENCOUNTER — Other Ambulatory Visit: Payer: Self-pay | Admitting: Family Medicine

## 2015-09-26 DIAGNOSIS — Z79899 Other long term (current) drug therapy: Secondary | ICD-10-CM | POA: Diagnosis not present

## 2015-09-26 DIAGNOSIS — K225 Diverticulum of esophagus, acquired: Secondary | ICD-10-CM | POA: Diagnosis not present

## 2015-09-26 DIAGNOSIS — R1310 Dysphagia, unspecified: Secondary | ICD-10-CM | POA: Diagnosis not present

## 2015-09-26 DIAGNOSIS — K219 Gastro-esophageal reflux disease without esophagitis: Secondary | ICD-10-CM | POA: Diagnosis not present

## 2015-09-26 DIAGNOSIS — R05 Cough: Secondary | ICD-10-CM | POA: Diagnosis not present

## 2015-09-26 DIAGNOSIS — E785 Hyperlipidemia, unspecified: Secondary | ICD-10-CM | POA: Diagnosis not present

## 2015-09-26 DIAGNOSIS — I208 Other forms of angina pectoris: Secondary | ICD-10-CM | POA: Diagnosis not present

## 2015-09-26 DIAGNOSIS — I10 Essential (primary) hypertension: Secondary | ICD-10-CM | POA: Diagnosis not present

## 2015-09-27 DIAGNOSIS — K219 Gastro-esophageal reflux disease without esophagitis: Secondary | ICD-10-CM | POA: Diagnosis not present

## 2015-09-27 DIAGNOSIS — R1310 Dysphagia, unspecified: Secondary | ICD-10-CM | POA: Diagnosis not present

## 2015-09-27 DIAGNOSIS — R05 Cough: Secondary | ICD-10-CM | POA: Diagnosis not present

## 2015-09-27 DIAGNOSIS — E785 Hyperlipidemia, unspecified: Secondary | ICD-10-CM | POA: Diagnosis not present

## 2015-09-27 DIAGNOSIS — Z79899 Other long term (current) drug therapy: Secondary | ICD-10-CM | POA: Diagnosis not present

## 2015-09-27 DIAGNOSIS — I208 Other forms of angina pectoris: Secondary | ICD-10-CM | POA: Diagnosis not present

## 2015-09-27 DIAGNOSIS — I10 Essential (primary) hypertension: Secondary | ICD-10-CM | POA: Diagnosis not present

## 2015-09-27 DIAGNOSIS — K225 Diverticulum of esophagus, acquired: Secondary | ICD-10-CM | POA: Diagnosis not present

## 2015-10-21 ENCOUNTER — Encounter: Payer: Self-pay | Admitting: Gastroenterology

## 2015-10-30 DIAGNOSIS — Z48815 Encounter for surgical aftercare following surgery on the digestive system: Secondary | ICD-10-CM | POA: Diagnosis not present

## 2015-10-30 DIAGNOSIS — Z9889 Other specified postprocedural states: Secondary | ICD-10-CM | POA: Diagnosis not present

## 2015-10-30 DIAGNOSIS — K225 Diverticulum of esophagus, acquired: Secondary | ICD-10-CM | POA: Diagnosis not present

## 2015-11-04 ENCOUNTER — Other Ambulatory Visit: Payer: Self-pay | Admitting: Family Medicine

## 2015-11-21 ENCOUNTER — Encounter: Payer: Medicare Other | Admitting: Family Medicine

## 2015-11-21 ENCOUNTER — Other Ambulatory Visit: Payer: Self-pay | Admitting: Family Medicine

## 2015-11-28 ENCOUNTER — Encounter: Payer: Self-pay | Admitting: Gastroenterology

## 2015-11-28 ENCOUNTER — Ambulatory Visit (INDEPENDENT_AMBULATORY_CARE_PROVIDER_SITE_OTHER): Payer: Medicare Other | Admitting: Gastroenterology

## 2015-11-28 VITALS — BP 113/61 | HR 71 | Temp 97.6°F | Ht 71.0 in | Wt 157.0 lb

## 2015-11-28 DIAGNOSIS — J3089 Other allergic rhinitis: Secondary | ICD-10-CM | POA: Diagnosis not present

## 2015-11-28 DIAGNOSIS — R1310 Dysphagia, unspecified: Secondary | ICD-10-CM

## 2015-11-28 DIAGNOSIS — K225 Diverticulum of esophagus, acquired: Secondary | ICD-10-CM

## 2015-11-28 MED ORDER — SALINE SPRAY 0.65 % NA SOLN: 2.0000 | NASAL | Status: DC | PRN

## 2015-11-28 NOTE — Progress Notes (Signed)
On recall  °

## 2015-11-28 NOTE — Progress Notes (Signed)
CC'D TO PCP °

## 2015-11-28 NOTE — Assessment & Plan Note (Signed)
SYMPTOMS NOT CONTROLLED.  USE SALINE SPRAY OR DROPS TO HELP WITH NIGHT TIME NASAL CONGESTION. SENT PRESCRIPTION TO [facility]. [place] FLONASE IF NEEDED FOR SNEEZING AND CONGESTION. PLEASE CALL WITH QUESTIONS OR CONCERNS. FOLLOW UP IN 1 YEAR.

## 2015-11-28 NOTE — Patient Instructions (Addendum)
USE SALINE SPRAY OR DROPS TO HELP WITH NIGHT TIME NASAL CONGESTION. I SENT THE PRESCRIPTION TO [facility].  USE FLONASE 1 OR 2 SPRAYS IN EACH NARES ONCE A DAY WHEN YOU HAVE SNEEZING, RUNNY NOSE, AND CONGESTION.  PLEASE CALL WITH QUESTIONS OR CONCERNS.  FOLLOW UP IN 1 YEAR.

## 2015-11-28 NOTE — Progress Notes (Signed)
Subjective:    Patient ID: Candace Cervantes, female    DOB: April 01, 1925, 82 y.o.   MRN: [id number]  [name], MD  HPI Had zenker's diverticulum excised at [facility] Dec 2016. No complication after the surgery. Now can swallow pills. Having trouble with sinus drainage. MAY USE VICK'S VAPOR RUB IN HER NOSE. FEELS LIKE DRAINAGE GETS STUCK. HAS FLONASE FROM DR. Moshe Cipro. BMs: good, every few day. PT DENIES FEVER, CHILLS, HEMATOCHEZIA, nausea, vomiting, melena, diarrhea, CHEST PAIN, SHORTNESS OF BREATH, CHANGE IN BOWEL IN HABITS, constipation, abdominal pain, problems swallowing, or heartburn or indigestion.  Past Medical History  Diagnosis Date  . Pacemaker 2002    Permanent Placement  . SSS (sick sinus syndrome) ([place])   . Pacemaker 2007    Generator Changed   . Hyperlipidemia   . Hypertension   . CAD (coronary artery disease) 2005    Nonobstructive 40% RCA lesion and normal ejection fraction at cath   . Gastroesophageal reflux disease   . Osteoporosis   . Anxiety disorder   . Osteoarthritis   . Sinoatrial node dysfunction ([facility])   . Zenker diverticulum     previously evaluated by Dr. Erik Obey in [id number].    Past Surgical History  Procedure Laterality Date  . Total abdominal hysterectomy  2002  . Cholecystectomy    . Cystectomy      from back of the neck  . Pacemaker insertion  2002  . Pacemaker placement  2007    replacement   . Esophagogastroduodenoscopy  2008    Dr. Oneida Alar, difficulty passing scope through UES, incomplete fibrous ring at GEJ, hh, multiple benign gastri polyps  . Colonoscopy  2003    no polyps.  . Esophagogastroduodenoscopy N/A 10/17/2013    ZENKER'S, STRICTURE: 10-12.8 MM,  NSAID GASTRITIS FG POLYPS  . Savory dilation N/A 10/17/2013    Procedure: SAVORY DILATION;  Surgeon: Danie Binder, MD;  [facility];  Service: Endoscopy;  Laterality: N/A;  with PEDS GASTROSCOPE  . Maloney dilation N/A 10/17/2013    Procedure: Venia Minks DILATION;  Surgeon:  Danie Binder, MD;  [facility];  Service: Endoscopy;  Laterality: N/A;  with PEDS GASTROSCOPE  . Esophagogastroduodenoscopy (egd) with esophageal dilation N/A 10/30/2013    SLF: 1. Zenkers diverticulum with a large opening at the cricopharyngeus 2. 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 Innumerable Fundic gland polyps 4. Mild NSAID gastritis   No Known Allergies  Current Outpatient Prescriptions  Medication Sig Dispense Refill  . ALPRAZolam (XANAX) 0.25 MG tablet TAKE 1 TABLET qhs prn FOR ANXIETY Not taking   . aspirin 81 MG tablet Take 81 mg by mouth daily.    . busPIRone (BUSPAR) 5 MG tablet Take 1 tablet (5 mg total) by mouth 2 (two) times daily.    Marland Kitchen CALCIUM 600+D 600-400 MG-UNIT  Take 1 tablet by mouth daily.    . citalopram (CELEXA) 10 MG tablet TAKE 1 TABLET BY MOUTH EVERY DAY    . fenofibrate (TRICOR) 145 MG tablet TAKE 1 TABLET BY MOUTH EVERY DAY    . fluticasone 50 MCG/ACT nasal spray Place 2 sprays into both nostrils daily.    . meclizine (ANTIVERT) 25 MG tablet TAKE 1 TABLET BY MOUTH TWICE DAILY AS NEEDED    . Multiple Vitamin tablet Take 1 tablet by mouth daily.    . nitroGLYCERIN 0.4 MG SL tablet prn    . PRILOSEC 20 MG capsule Take 1 capsule (20 mg total) by mouth daily.    Marland Kitchen  KLOR-CON M20 20 MEQ tablet Take 1 tablet (20 mEq total) by mouth daily.    Marland Kitchen PRAVACHOL 40 MG tablet TAKE 1 TABLET BY MOUTH EVERY DAY    . [id number] 37.5-25 MG tablet TAKE ONE AND ONE-HALF(1.5) TABLETS BY MOUTH DAILY     Review of Systems PER HPI OTHERWISE ALL SYSTEMS ARE NEGATIVE.    Objective:   Physical Exam  Constitutional: She is oriented to person, place, and time. She appears well-developed and well-nourished. No distress.  HENT:  Head: Normocephalic and atraumatic.  Mouth/Throat: Oropharynx is clear and moist. No oropharyngeal exudate.  Eyes: Pupils are equal, round, and reactive to light. No scleral icterus.  Neck: Normal range of motion. Neck supple.  Cardiovascular: Normal  rate, regular rhythm and normal heart sounds.   Pulmonary/Chest: Effort normal and breath sounds normal. No respiratory distress.  Abdominal: Soft. Bowel sounds are normal. She exhibits no distension. There is no tenderness.  Musculoskeletal: She exhibits no edema.  Lymphadenopathy:    She has no cervical adenopathy.  Neurological: She is alert and oriented to person, place, and time.  NO FOCAL DEFICITS  Psychiatric: She has a normal mood and affect.  Vitals reviewed.     Assessment & Plan:

## 2015-11-28 NOTE — Assessment & Plan Note (Signed)
ZENKER'S REPAIRED WEIGHT STABLE.  NO ADDITIONAL WORKUP NEEDED AT THIS TIME.

## 2015-11-28 NOTE — Progress Notes (Signed)
ON RECALL  °

## 2015-11-28 NOTE — Assessment & Plan Note (Signed)
ZENKER'S REPAIRED DEC 2016. SYMPTOMS CONTROLLED/RESOLVED.  CONTINUE TO MONITOR SYMPTOMS. PLEASE CALL WITH QUESTIONS OR CONCERNS. FOLLOW UP IN 1 YEAR.

## 2015-12-25 ENCOUNTER — Encounter: Payer: Self-pay | Admitting: Family Medicine

## 2015-12-25 ENCOUNTER — Ambulatory Visit (INDEPENDENT_AMBULATORY_CARE_PROVIDER_SITE_OTHER): Payer: Medicare Other | Admitting: Family Medicine

## 2015-12-25 VITALS — BP 128/78 | HR 70 | Resp 16 | Ht 70.0 in | Wt 158.0 lb

## 2015-12-25 DIAGNOSIS — Z0000 Encounter for general adult medical examination without abnormal findings: Secondary | ICD-10-CM | POA: Diagnosis not present

## 2015-12-25 DIAGNOSIS — Z09 Encounter for follow-up examination after completed treatment for conditions other than malignant neoplasm: Secondary | ICD-10-CM | POA: Insufficient documentation

## 2015-12-25 NOTE — Progress Notes (Signed)
Subjective:    Patient ID: Candace Cervantes, female    DOB: [birth year], 82 y.o.   MRN: [id number]  HPI  Preventive Screening-Counseling & Management   Patient present here today for a Medicare annual wellness visit.   Current Problems (verified)   Medications Prior to Visit Allergies (verified)   PAST HISTORY  Family History (verified)   Social History  Married with 2 children, retired Sales promotion account executive in [employer]    Risk Factors  Current exercise habits:  Limited, but walks in the evenings as able. Will give info on chair exercises   Dietary issues discussed: heart healthy diet encouraged, limit fried foods and carbs    Cardiac risk factors: Prediabetes  Depression Screen  (Note: if answer to either of the following is "Yes", a more complete depression screening is indicated)   Over the past two weeks, have you felt down, depressed or hopeless? sometimes Over the past two weeks, have you felt little interest or pleasure in doing things? No  Have you lost interest or pleasure in daily life? No  Do you often feel hopeless? No  Do you cry easily over simple problems? No   Activities of Daily Living  In your present state of health, do you have any difficulty performing the following activities?  Driving?: No Managing money?: No Feeding yourself?:No Getting from bed to chair?:No Climbing a flight of stairs?: limited  Preparing food and eating?:No Bathing or showering?: still takes baths  Getting dressed?:No Getting to the toilet?:No Using the toilet?:No Moving around from place to place?: uses a cane   Fall Risk Assessment In the past year have you fallen or had a near fall?:No Are you currently taking any medications that make you dizziness?:No   Hearing Difficulties: No Do you often ask people to speak up or repeat themselves?:yes Do you experience ringing or noises in your ears?:No Do you have difficulty understanding soft or whispered voices?:  sometimes  Cognitive Testing  Alert? Yes Normal Appearance?Yes  Oriented to person? Yes Place? Yes  Time? Yes  Displays appropriate judgment?Yes  Can read the correct time from a watch face? yes Are you having problems remembering things? Some short term problems  Advanced Directives have been discussed with the patient?Yes, still has brochure about advanced directives, has living will and her daughter Jeannene Patella and her daughter Judd Lien  Have HPOA  List the Names of Other Physician/Practitioners you currently use:  Updated   Indicate any recent Medical Services you may have received from other than [facility] providers in the past year (date may be approximate).   Assessment:    Annual Wellness Exam   Plan:    .  Medicare Attestation  I have personally reviewed:  The patient's medical and social history  Their use of alcohol, tobacco or illicit drugs  Their current medications and supplements  The patient's functional ability including ADLs,fall risks, home safety risks, cognitive, and hearing and visual impairment  Diet and physical activities  Evidence for depression or mood disorders  The patient's weight, height, BMI, and visual acuity have been recorded in the chart. I have made referrals, counseling, and provided education to the patient based on review of the above and I have provided the patient with a written personalized care plan for preventive services.     Review of Systems     Objective:   Physical Exam  BP 128/78 mmHg  Pulse 70  Resp 16  Ht 5\' 10"  (1.778 m)  Wt  158 lb (71.668 kg)  BMI 22.67 kg/m2  SpO2 96%       Assessment & Plan:  Medicare annual wellness visit, subsequent Annual exam as documented. Counseling done  re healthy lifestyle involving commitment to 150 minutes exercise per week, heart healthy diet, and attaining healthy weight.The importance of adequate sleep also discussed. Regular seat belt use and home safety, is also  discussed. Changes in health habits are decided on by the patient with goals and time frames  set for achieving them. Immunization and cancer screening needs are specifically addressed at this visit.

## 2015-12-25 NOTE — Assessment & Plan Note (Signed)

## 2015-12-25 NOTE — Patient Instructions (Signed)
F/u in 4 months, call if you need me before  Fasting labs needed as soon as possible, past due  Please arrange with nurse to bring in meds for review

## 2015-12-28 ENCOUNTER — Encounter: Payer: Self-pay | Admitting: Family Medicine

## 2015-12-30 MED ORDER — TRIAMTERENE-HCTZ 37.5-25 MG PO TABS: ORAL_TABLET | ORAL | Status: DC

## 2015-12-30 MED ORDER — BUSPIRONE HCL 5 MG PO TABS: 5.0000 mg | ORAL_TABLET | Freq: Two times a day (BID) | ORAL | Status: DC

## 2015-12-30 MED ORDER — CITALOPRAM HYDROBROMIDE 10 MG PO TABS: 10.0000 mg | ORAL_TABLET | Freq: Every day | ORAL | Status: DC

## 2015-12-30 NOTE — Addendum Note (Signed)
Addended by: Eual Fines on: 12/30/2015 03:11 PM   Modules accepted: Orders, Medications

## 2015-12-31 ENCOUNTER — Encounter (HOSPITAL_COMMUNITY): Payer: Self-pay

## 2015-12-31 DIAGNOSIS — R7302 Impaired glucose tolerance (oral): Secondary | ICD-10-CM | POA: Diagnosis not present

## 2015-12-31 DIAGNOSIS — I10 Essential (primary) hypertension: Secondary | ICD-10-CM | POA: Diagnosis not present

## 2015-12-31 DIAGNOSIS — E785 Hyperlipidemia, unspecified: Secondary | ICD-10-CM | POA: Diagnosis not present

## 2016-01-01 LAB — COMPLETE METABOLIC PANEL WITH GFR
ALT: 13 U/L (ref 6–29)
AST: 21 U/L (ref 10–35)
Albumin: 4.1 g/dL (ref 3.6–5.1)
Alkaline Phosphatase: 56 U/L (ref 33–130)
BUN: 18 mg/dL (ref 7–25)
CHLORIDE: 103 mmol/L (ref 98–110)
CO2: 32 mmol/L — AB (ref 20–31)
CREATININE: 0.99 mg/dL — AB (ref 0.60–0.88)
Calcium: 9.8 mg/dL (ref 8.6–10.4)
GFR, EST AFRICAN AMERICAN: 59 mL/min — AB (ref >=60)
GFR, Est Non African American: 51 mL/min — ABNORMAL LOW (ref >=60)
Glucose, Bld: 92 mg/dL (ref 65–99)
Potassium: 3.9 mmol/L (ref 3.5–5.3)
Sodium: 140 mmol/L (ref 135–146)
Total Bilirubin: 0.6 mg/dL (ref 0.2–1.2)
Total Protein: 7 g/dL (ref 6.1–8.1)

## 2016-01-01 LAB — HEMOGLOBIN A1C
Hgb A1c MFr Bld: 5.8 % — ABNORMAL HIGH (ref <5.7)
Mean Plasma Glucose: 120 mg/dL

## 2016-01-01 LAB — LIPID PANEL
Cholesterol: 202 mg/dL — ABNORMAL HIGH (ref 125–200)
HDL: 101 mg/dL (ref >=46)
LDL CALC: 91 mg/dL (ref <130)
TRIGLYCERIDES: 52 mg/dL (ref <150)
Total CHOL/HDL Ratio: 2 Ratio (ref <=5.0)
VLDL: 10 mg/dL (ref <30)

## 2016-01-14 ENCOUNTER — Other Ambulatory Visit: Payer: Self-pay | Admitting: Family Medicine

## 2016-03-03 ENCOUNTER — Other Ambulatory Visit: Payer: Self-pay | Admitting: Family Medicine

## 2016-03-26 ENCOUNTER — Encounter (HOSPITAL_COMMUNITY): Payer: Self-pay | Admitting: Emergency Medicine

## 2016-03-26 ENCOUNTER — Observation Stay (HOSPITAL_COMMUNITY)
Admission: EM | Admit: 2016-03-26 | Discharge: 2016-03-27 | Disposition: A | Discharge status: Home / Self Care | Payer: Medicare Other | Attending: Internal Medicine | Admitting: Internal Medicine

## 2016-03-26 ENCOUNTER — Other Ambulatory Visit: Payer: Self-pay

## 2016-03-26 ENCOUNTER — Emergency Department (HOSPITAL_COMMUNITY): Payer: Medicare Other

## 2016-03-26 DIAGNOSIS — R079 Chest pain, unspecified: Principal | ICD-10-CM

## 2016-03-26 DIAGNOSIS — I10 Essential (primary) hypertension: Secondary | ICD-10-CM | POA: Diagnosis not present

## 2016-03-26 DIAGNOSIS — E785 Hyperlipidemia, unspecified: Secondary | ICD-10-CM | POA: Diagnosis not present

## 2016-03-26 DIAGNOSIS — M810 Age-related osteoporosis without current pathological fracture: Secondary | ICD-10-CM | POA: Insufficient documentation

## 2016-03-26 DIAGNOSIS — I25118 Atherosclerotic heart disease of native coronary artery with other forms of angina pectoris: Secondary | ICD-10-CM

## 2016-03-26 DIAGNOSIS — Z79899 Other long term (current) drug therapy: Secondary | ICD-10-CM | POA: Diagnosis not present

## 2016-03-26 DIAGNOSIS — E876 Hypokalemia: Secondary | ICD-10-CM

## 2016-03-26 DIAGNOSIS — K219 Gastro-esophageal reflux disease without esophagitis: Secondary | ICD-10-CM | POA: Diagnosis present

## 2016-03-26 DIAGNOSIS — K225 Diverticulum of esophagus, acquired: Secondary | ICD-10-CM | POA: Diagnosis present

## 2016-03-26 DIAGNOSIS — I2510 Atherosclerotic heart disease of native coronary artery without angina pectoris: Secondary | ICD-10-CM | POA: Diagnosis present

## 2016-03-26 DIAGNOSIS — R1013 Epigastric pain: Secondary | ICD-10-CM | POA: Diagnosis present

## 2016-03-26 LAB — COMPREHENSIVE METABOLIC PANEL
ALBUMIN: 3.9 g/dL (ref 3.5–5.0)
ALK PHOS: 51 U/L (ref 38–126)
ALT: 18 U/L (ref 14–54)
ANION GAP: 7 (ref 5–15)
AST: 26 U/L (ref 15–41)
BILIRUBIN TOTAL: 0.6 mg/dL (ref 0.3–1.2)
BUN: 20 mg/dL (ref 6–20)
CHLORIDE: 102 mmol/L (ref 101–111)
CO2: 30 mmol/L (ref 22–32)
Calcium: 9.5 mg/dL (ref 8.9–10.3)
Creatinine, Ser: 1.02 mg/dL — ABNORMAL HIGH (ref 0.44–1.00)
GFR calc Af Amer: 55 mL/min — ABNORMAL LOW (ref >60)
GFR, EST NON AFRICAN AMERICAN: 47 mL/min — AB (ref >60)
GLUCOSE: 108 mg/dL — AB (ref 65–99)
POTASSIUM: 3.2 mmol/L — AB (ref 3.5–5.1)
Sodium: 139 mmol/L (ref 135–145)
TOTAL PROTEIN: 6.9 g/dL (ref 6.5–8.1)

## 2016-03-26 LAB — CBC WITH DIFFERENTIAL/PLATELET
BASOS PCT: 0 %
Basophils Absolute: 0 10*3/uL (ref 0.0–0.1)
Eosinophils Absolute: 0 10*3/uL (ref 0.0–0.7)
Eosinophils Relative: 1 %
HEMATOCRIT: 34.3 % — AB (ref 36.0–46.0)
HEMOGLOBIN: 11.2 g/dL — AB (ref 12.0–15.0)
LYMPHS ABS: 1.9 10*3/uL (ref 0.7–4.0)
LYMPHS PCT: 46 %
MCH: 30.1 pg (ref 26.0–34.0)
MCHC: 32.7 g/dL (ref 30.0–36.0)
MCV: 92.2 fL (ref 78.0–100.0)
MONOS PCT: 8 %
Monocytes Absolute: 0.4 10*3/uL (ref 0.1–1.0)
NEUTROS ABS: 1.9 10*3/uL (ref 1.7–7.7)
NEUTROS PCT: 45 %
Platelets: 165 10*3/uL (ref 150–400)
RBC: 3.72 MIL/uL — ABNORMAL LOW (ref 3.87–5.11)
RDW: 13.9 % (ref 11.5–15.5)
WBC: 4.2 10*3/uL (ref 4.0–10.5)

## 2016-03-26 LAB — TROPONIN I
TROPONIN I: 0.03 ng/mL (ref <0.031)
Troponin I: <0.03 ng/mL (ref <0.031)
Troponin I: <0.03 ng/mL (ref <0.031)

## 2016-03-26 LAB — MAGNESIUM: Magnesium: 1.8 mg/dL (ref 1.7–2.4)

## 2016-03-26 LAB — LIPASE, BLOOD: Lipase: 22 U/L (ref 11–51)

## 2016-03-26 MED ORDER — PANTOPRAZOLE SODIUM 40 MG PO TBEC
40.0000 mg | DELAYED_RELEASE_TABLET | Freq: Every day | ORAL | Status: DC
  Administered 2016-03-26 – 2016-03-27 (×2): 40 mg via ORAL
  Filled 2016-03-26 (×2): qty 1

## 2016-03-26 MED ORDER — NITROGLYCERIN 0.4 MG SL SUBL
0.4000 mg | SUBLINGUAL_TABLET | Freq: Once | SUBLINGUAL | Status: AC
  Administered 2016-03-26: 0.4 mg via SUBLINGUAL
  Filled 2016-03-26: qty 1

## 2016-03-26 MED ORDER — GI COCKTAIL ~~LOC~~
30.0000 mL | Freq: Once | ORAL | Status: AC
  Administered 2016-03-26: 30 mL via ORAL
  Filled 2016-03-26: qty 30

## 2016-03-26 MED ORDER — POTASSIUM CHLORIDE CRYS ER 10 MEQ PO TBCR
EXTENDED_RELEASE_TABLET | ORAL | Status: AC
  Filled 2016-03-26: qty 4

## 2016-03-26 MED ORDER — ASPIRIN EC 81 MG PO TBEC: 81.0000 mg | DELAYED_RELEASE_TABLET | Freq: Every day | ORAL | Status: DC

## 2016-03-26 MED ORDER — POTASSIUM CHLORIDE CRYS ER 20 MEQ PO TBCR
20.0000 meq | EXTENDED_RELEASE_TABLET | Freq: Every day | ORAL | Status: DC
  Administered 2016-03-26 – 2016-03-27 (×2): 20 meq via ORAL
  Filled 2016-03-26 (×3): qty 1

## 2016-03-26 MED ORDER — CALCIUM CARBONATE-VITAMIN D 500-200 MG-UNIT PO TABS
1.0000 | ORAL_TABLET | Freq: Every day | ORAL | Status: DC
  Administered 2016-03-26 – 2016-03-27 (×2): 1 via ORAL
  Filled 2016-03-26 (×2): qty 1

## 2016-03-26 MED ORDER — ADULT MULTIVITAMIN W/MINERALS CH
1.0000 | ORAL_TABLET | Freq: Every day | ORAL | Status: DC
  Administered 2016-03-26 – 2016-03-27 (×2): 1 via ORAL
  Filled 2016-03-26 (×2): qty 1

## 2016-03-26 MED ORDER — BUSPIRONE HCL 5 MG PO TABS
5.0000 mg | ORAL_TABLET | Freq: Two times a day (BID) | ORAL | Status: DC
  Administered 2016-03-26 – 2016-03-27 (×3): 5 mg via ORAL
  Filled 2016-03-26 (×3): qty 1

## 2016-03-26 MED ORDER — POTASSIUM CHLORIDE CRYS ER 20 MEQ PO TBCR
40.0000 meq | EXTENDED_RELEASE_TABLET | Freq: Once | ORAL | Status: AC
  Administered 2016-03-26: 40 meq via ORAL

## 2016-03-26 MED ORDER — ASPIRIN EC 325 MG PO TBEC
325.0000 mg | DELAYED_RELEASE_TABLET | Freq: Every day | ORAL | Status: DC
  Administered 2016-03-26 – 2016-03-27 (×2): 325 mg via ORAL
  Filled 2016-03-26 (×2): qty 1

## 2016-03-26 MED ORDER — CITALOPRAM HYDROBROMIDE 20 MG PO TABS
10.0000 mg | ORAL_TABLET | Freq: Every day | ORAL | Status: DC
  Administered 2016-03-26 – 2016-03-27 (×2): 10 mg via ORAL
  Filled 2016-03-26 (×2): qty 1

## 2016-03-26 MED ORDER — TRIAMTERENE-HCTZ 37.5-25 MG PO TABS
1.0000 | ORAL_TABLET | Freq: Every day | ORAL | Status: DC
  Administered 2016-03-26 – 2016-03-27 (×2): 1 via ORAL
  Filled 2016-03-26 (×2): qty 1

## 2016-03-26 MED ORDER — FLUTICASONE PROPIONATE 50 MCG/ACT NA SUSP
2.0000 | Freq: Every day | NASAL | Status: DC
  Administered 2016-03-26 – 2016-03-27 (×2): 2 via NASAL
  Filled 2016-03-26 (×2): qty 16

## 2016-03-26 MED ORDER — SALINE SPRAY 0.65 % NA SOLN: 2.0000 | NASAL | Status: DC | PRN

## 2016-03-26 MED ORDER — PRAVASTATIN SODIUM 40 MG PO TABS
40.0000 mg | ORAL_TABLET | Freq: Every day | ORAL | Status: DC
  Administered 2016-03-26 – 2016-03-27 (×2): 40 mg via ORAL
  Filled 2016-03-26 (×2): qty 1

## 2016-03-26 MED ORDER — GI COCKTAIL ~~LOC~~
30.0000 mL | Freq: Four times a day (QID) | ORAL | Status: DC | PRN
  Filled 2016-03-26: qty 30

## 2016-03-26 MED ORDER — NITROGLYCERIN 0.4 MG SL SUBL: 0.4000 mg | SUBLINGUAL_TABLET | SUBLINGUAL | Status: DC | PRN

## 2016-03-26 MED ORDER — ONDANSETRON HCL 4 MG/2ML IJ SOLN: 4.0000 mg | Freq: Four times a day (QID) | INTRAMUSCULAR | Status: DC | PRN

## 2016-03-26 MED ORDER — ACETAMINOPHEN 325 MG PO TABS: 650.0000 mg | ORAL_TABLET | ORAL | Status: DC | PRN

## 2016-03-26 MED ORDER — ENOXAPARIN SODIUM 40 MG/0.4ML ~~LOC~~ SOLN
40.0000 mg | SUBCUTANEOUS | Status: DC
  Administered 2016-03-26: 40 mg via SUBCUTANEOUS
  Filled 2016-03-26: qty 0.4

## 2016-03-26 NOTE — ED Notes (Signed)
Dr Waverly Ferrari aware that pt rates chest pain 5. No further nitro adm

## 2016-03-26 NOTE — ED Notes (Signed)
Attempted x2 to obtain urine. Unable to void on bedpan and then attempted on [facility]

## 2016-03-26 NOTE — H&P (Signed)
History and Physical    Candace Cervantes [id number] DOB: [birth year] DOA: 03/26/2016  Referring MD/NP/PA: Malachy Moan, EDP PCP: [name], MD  Patient coming from: Home  Chief Complaint: Chest Pain  HPI: Candace Cervantes is a 82 y.o. female with h/o CAD with a 40% RCA noted during cath in 2005, HTN, HLD, SSS s/p PPM, GERD with a Zenker's diverticulum. She started having epigastric abdominal pain this am after breakfast, after a while this migrated into her chest, she describes it as a "heavy feeling". No SOB, no palpitations, no diaphoresis. She took some NTG which helped pain somewhat but didn't completely eliminate it. Her daughter prompted her to come to the hospital for evaluation. Initial troponin is negative. EKG no acute ischemic changes. We are as ked to admit her for ACS rule out.  Past Medical/Surgical History: Past Medical History  Diagnosis Date  . Pacemaker 2002    Permanent Placement  . SSS (sick sinus syndrome) ([place])   . Pacemaker 2007    Generator Changed   . Hyperlipidemia   . Hypertension   . CAD (coronary artery disease) 2005    Nonobstructive 40% RCA lesion and normal ejection fraction at cath   . Gastroesophageal reflux disease   . Osteoporosis   . Anxiety disorder   . Osteoarthritis   . Sinoatrial node dysfunction (HCC)   . Zenker diverticulum     previously evaluated by Dr. Erik Obey in [id number].      Past Surgical History  Procedure Laterality Date  . Total abdominal hysterectomy  2002  . Cholecystectomy    . Cystectomy      from back of the neck  . Pacemaker insertion  2002  . Pacemaker placement  2007    replacement   . Esophagogastroduodenoscopy  2008    Dr. Oneida Alar, difficulty passing scope through UES, incomplete fibrous ring at GEJ, hh, multiple benign gastri polyps  . Colonoscopy  2003    no polyps.  . Esophagogastroduodenoscopy N/A 10/17/2013    ZENKER'S, STRICTURE: 10-12.8 MM,  NSAID GASTRITIS FG POLYPS  . Savory dilation N/A 10/17/2013     Procedure: SAVORY DILATION;  Surgeon: Danie Binder, MD;  [facility];  Service: Endoscopy;  Laterality: N/A;  with PEDS GASTROSCOPE  . Maloney dilation N/A 10/17/2013    Procedure: Venia Minks DILATION;  Surgeon: Danie Binder, MD;  Location: [facility];  Service: Endoscopy;  Laterality: N/A;  with PEDS GASTROSCOPE  . Esophagogastroduodenoscopy (egd) with esophageal dilation N/A 10/30/2013    SLF: 1. Zenkers diverticulum with a large opening at the cricopharyngeus 2. 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 Innumerable Fundic gland polyps 4. Mild NSAID gastritis   Social History:  reports that she has never smoked. She has never used smokeless tobacco. She reports that she does not drink alcohol or use illicit drugs.  Allergies: No Known Allergies  Family History:  Family History  Problem Relation Age of Onset  . Heart disease Mother   . Colon cancer Other     sibling, age 15  . Lung disease Sister   . Cancer Brother   . Alcohol abuse Brother     Prior to Admission medications   Medication Sig Start Date End Date Taking? Authorizing Provider  busPIRone (BUSPAR) 5 MG tablet Take 1 tablet (5 mg total) by mouth 2 (two) times daily. 12/30/15  Yes Fayrene Helper, MD  citalopram (CELEXA) 10 MG tablet Take 1 tablet (10 mg total) by mouth daily. 12/30/15  Yes  Fayrene Helper, MD  fluticasone [facility]) 50 MCG/ACT nasal spray Place 2 sprays into both nostrils daily. 08/07/15  Yes Fayrene Helper, MD  Multiple Vitamin (MULTIVITAMIN WITH MINERALS) TABS tablet Take 1 tablet by mouth daily.   Yes Historical Provider, MD  nitroGLYCERIN (NITROSTAT) 0.4 MG SL tablet Place 1 tablet (0.4 mg total) under the tongue every 5 (five) minutes as needed for chest pain. One tablet under the tongue at onset of chest pains, repeat every five minutes as needed 12/31/14  Yes Fayrene Helper, MD  omeprazole (PRILOSEC) 20 MG capsule Take 1 capsule (20 mg total) by mouth daily. 04/10/15  Yes Fayrene Helper, MD  potassium chloride SA (KLOR-CON M20) 20 MEQ tablet Take 1 tablet (20 mEq total) by mouth daily. 05/03/15  Yes Fayrene Helper, MD  pravastatin (PRAVACHOL) 40 MG tablet TAKE 1 TABLET BY MOUTH EVERY DAY 05/30/15  Yes Fayrene Helper, MD  sodium chloride (OCEAN) 0.65 % SOLN nasal spray Place 2 sprays into both nostrils as needed for congestion. 11/28/15  Yes Danie Binder, MD  triamterene-hydrochlorothiazide (MAXZIDE-25) 37.5-25 MG tablet TAKE ONE AND ONE-HALF(1.5) TABLETS BY MOUTH DAILY 12/30/15  Yes Fayrene Helper, MD    Review of Systems:  Constitutional: Denies fever, chills, diaphoresis, appetite change and fatigue.  HEENT: Denies photophobia, eye pain, redness, hearing loss, ear pain, congestion, sore throat, rhinorrhea, sneezing, mouth sores, trouble swallowing, neck pain, neck stiffness and tinnitus.   Respiratory: Denies  cough,  and wheezing.   Cardiovascular: Denies  palpitations and leg swelling.  Gastrointestinal: Denies nausea, vomiting, abdominal pain, diarrhea, constipation, blood in stool and abdominal distention.  Genitourinary: Denies dysuria, urgency, frequency, hematuria, flank pain and difficulty urinating.  Endocrine: Denies: hot or cold intolerance, sweats, changes in hair or nails, polyuria, polydipsia. Musculoskeletal: Denies myalgias, back pain, joint swelling, arthralgias and gait problem.  Skin: Denies pallor, rash and wound.  Neurological: Denies dizziness, seizures, syncope, weakness, light-headedness, numbness and headaches.  Hematological: Denies adenopathy. Easy bruising, personal or family bleeding history  Psychiatric/Behavioral: Denies suicidal ideation, mood changes, confusion, nervousness, sleep disturbance and agitation    Physical Exam: Filed Vitals:   03/26/16 0800 03/26/16 0830 03/26/16 0911 03/26/16 1400  BP: 134/75 135/79 155/81 117/56  Pulse: 59 59 60 59  Temp:   97.6 F (36.4 C) 98.4 F (36.9 C)  TempSrc:   Oral Oral    Resp: 19 10  14   Height:   5\' 11"  (1.803 m)   Weight:   71.668 kg (158 lb)   SpO2: 98% 98% 99% 100%     Constitutional: NAD, calm, comfortable Eyes: PERRL, lids and conjunctivae normal ENMT: Mucous membranes are moist. Posterior pharynx clear of any exudate or lesions.Normal dentition.  Neck: normal, supple, no masses, no thyromegaly Respiratory: clear to auscultation bilaterally, no wheezing, no crackles. Normal respiratory effort. No accessory muscle use.  Cardiovascular: Regular rate and rhythm, no murmurs / rubs / gallops. No extremity edema. 2+ pedal pulses. No carotid bruits.  Abdomen: no tenderness, no masses palpated. No hepatosplenomegaly. Bowel sounds positive.  Musculoskeletal: no clubbing / cyanosis. No joint deformity upper and lower extremities. Good ROM, no contractures. Normal muscle tone.  Skin: no rashes, lesions, ulcers. No induration Neurologic: CN 2-12 grossly intact. Sensation intact, DTR normal. Strength 5/5 in all 4.  Psychiatric: Normal judgment and insight. Alert and oriented x 3. Normal mood.    Labs on Admission: I have personally reviewed the following labs and imaging studies  CBC:  Recent Labs Lab 03/26/16 0611  WBC 4.2  NEUTROABS 1.9  HGB 11.2*  HCT 34.3*  MCV 92.2  PLT 123XX123   Basic Metabolic Panel:  Recent Labs Lab 03/26/16 0611  NA 139  K 3.2*  CL 102  CO2 30  GLUCOSE 108*  BUN 20  CREATININE 1.02*  CALCIUM 9.5   GFR: Estimated Creatinine Clearance: 41.8 mL/min (by C-G formula based on Cr of 1.02). Liver Function Tests:  Recent Labs Lab 03/26/16 0611  AST 26  ALT 18  ALKPHOS 51  BILITOT 0.6  PROT 6.9  ALBUMIN 3.9    Recent Labs Lab 03/26/16 0611  LIPASE 22   No results for input(s): AMMONIA in the last 168 hours. Coagulation Profile: No results for input(s): INR, PROTIME in the last 168 hours. Cardiac Enzymes:  Recent Labs Lab 03/26/16 0611 03/26/16 1249 03/26/16 1515  TROPONINI <0.03 <0.03 <0.03   BNP  (last 3 results) No results for input(s): PROBNP in the last 8760 hours. HbA1C: No results for input(s): HGBA1C in the last 72 hours. CBG: No results for input(s): GLUCAP in the last 168 hours. Lipid Profile: No results for input(s): CHOL, HDL, LDLCALC, TRIG, CHOLHDL, LDLDIRECT in the last 72 hours. Thyroid Function Tests: No results for input(s): TSH, T4TOTAL, FREET4, T3FREE, THYROIDAB in the last 72 hours. Anemia Panel: No results for input(s): VITAMINB12, FOLATE, FERRITIN, TIBC, IRON, RETICCTPCT in the last 72 hours. Urine analysis: No results found for: COLORURINE, APPEARANCEUR, LABSPEC, PHURINE, GLUCOSEU, HGBUR, BILIRUBINUR, KETONESUR, PROTEINUR, UROBILINOGEN, NITRITE, LEUKOCYTESUR Sepsis Labs: @LABRCNTIP (procalcitonin:4,lacticidven:4) )No results found for this or any previous visit (from the past 240 hour(s)).   Radiological Exams on Admission: Dg Chest 2 View  03/26/2016  CLINICAL DATA:  82 year old female with chest pain EXAM: CHEST  2 VIEW COMPARISON:  Chest radiograph dated 07/16/2012 and CT dated 07/17/2012 FINDINGS: Two views of the chest demonstrate emphysematous changes of the lungs with bibasilar atelectasis/scarring. No focal consolidation, pleural effusion, or pneumothorax. Mild cardiomegaly. Left pectoral pacemaker device. Osteopenia with degenerative changes of the spine. No acute osseous pathology. IMPRESSION: No active cardiopulmonary disease. Electronically Signed   By: Anner Crete M.D.   On: 03/26/2016 05:54    EKG: Independently reviewed. Atrial paced rhythm, no acute ischemic changes.  Assessment/Plan Principal Problem:   Chest pain Active Problems:   Hyperlipemia   Essential hypertension   ZENKER'S DIVERTICULUM   GERD   CAD (coronary artery disease)   Hypokalemia    Chest Pain -With both typical and atypical features. -Initial trop negative, EKG without acute changes. -Has a h/o CAD with a HEART score of 5. -Admit to telemetry, cycle troponins,  repeat EKG in am, check ECHO. -If r/o, will consider DC home with OP follow up with cards; if rules in will request inpatient cards evaluation.  GERD -Continue PPI.  Hyperlipidemia -Continue statin.  HTN -Fair control. -Do not anticipate medication changes while in the hospital.  Hypokalemia -Replete orally. -Check Mg level.   DVT prophylaxis: Lovenox  Code Status: Full Code  Family Communication: Patient only  Disposition Plan: Likely home in 24 hours  Consults called: None  Admission status: Observation    Time Spent: 70 minutes.  Lelon Frohlich MD [facility] Pager [phone]  If 7PM-7AM, please contact night-coverage [email or web address] Password [facility]  03/26/2016, 4:17 PM

## 2016-03-26 NOTE — ED Notes (Addendum)
Pt woke up chest pain/ upper abd pain that radiates to right arm. Pt took 3 nitro before ems arrived and pain free after nitro. Pt received 324 baby aspirin enroute.

## 2016-03-26 NOTE — ED Notes (Signed)
Potassium delivered from pharmacy

## 2016-03-26 NOTE — ED Provider Notes (Signed)
CSN: [id number]     Arrival date & time 03/26/16  0518 History   First MD Initiated Contact with Patient 03/26/16 [id number]     Chief Complaint  Patient presents with  . Chest Pain     (Consider location/radiation/quality/duration/timing/severity/associated sxs/prior Treatment) HPI Comments: Presents to the emergency department for evaluation of chest pain. Patient was awakened from sleep at around 2 AM with chest pain. Pain was in the center of her chest and radiated to the right arm. No associated shortness of breath, nausea or diaphoresis. Patient took 3 nitroglycerin total. She reports that she had no relief with the first nitroglycerin, second nitroglycerin eased the pain often when it started to come back. She took a third nitroglycerin which also is the pain off. Blood pressure has been normal. EMS as administered aspirin. She had told EMS that she was experiencing discomfort in the right upper and left upper abdomen during transport, but at arrival to the ER she reports that this is no longer present.  Patient is a 82 y.o. female presenting with chest pain.  Chest Pain   Past Medical History  Diagnosis Date  . Pacemaker 2002    Permanent Placement  . SSS (sick sinus syndrome) ([place])   . Pacemaker 2007    Generator Changed   . Hyperlipidemia   . Hypertension   . CAD (coronary artery disease) 2005    Nonobstructive 40% RCA lesion and normal ejection fraction at cath   . Gastroesophageal reflux disease   . Osteoporosis   . Anxiety disorder   . Osteoarthritis   . Sinoatrial node dysfunction (HCC)   . Zenker diverticulum     previously evaluated by Dr. Erik Obey in [id number].    Past Surgical History  Procedure Laterality Date  . Total abdominal hysterectomy  2002  . Cholecystectomy    . Cystectomy      from back of the neck  . Pacemaker insertion  2002  . Pacemaker placement  2007    replacement   . Esophagogastroduodenoscopy  2008    Dr. Oneida Alar, difficulty passing scope through  UES, incomplete fibrous ring at GEJ, hh, multiple benign gastri polyps  . Colonoscopy  2003    no polyps.  . Esophagogastroduodenoscopy N/A 10/17/2013    ZENKER'S, STRICTURE: 10-12.8 MM,  NSAID GASTRITIS FG POLYPS  . Savory dilation N/A 10/17/2013    Procedure: SAVORY DILATION;  Surgeon: Danie Binder, MD;  [facility];  Service: Endoscopy;  Laterality: N/A;  with PEDS GASTROSCOPE  . Maloney dilation N/A 10/17/2013    Procedure: Venia Minks DILATION;  Surgeon: Danie Binder, MD;  [facility];  Service: Endoscopy;  Laterality: N/A;  with PEDS GASTROSCOPE  . Esophagogastroduodenoscopy (egd) with esophageal dilation N/A 10/30/2013    SLF: 1. Zenkers diverticulum with a large opening at the cricopharyngeus 2. 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 Innumerable Fundic gland polyps 4. Mild NSAID gastritis   Family History  Problem Relation Age of Onset  . Heart disease Mother   . Colon cancer Other     sibling, age 92  . Lung disease Sister   . Cancer Brother   . Alcohol abuse Brother    Social History  Substance Use Topics  . Smoking status: Never Smoker   . Smokeless tobacco: Never Used  . Alcohol Use: No   OB History    No data available     Review of Systems  Cardiovascular: Positive for chest pain.  All other systems reviewed and  are negative.     Allergies  Review of patient's allergies indicates no known allergies.  Home Medications   Prior to Admission medications   Medication Sig Start Date End Date Taking? Authorizing Provider  ALPRAZolam (XANAX) 0.25 MG tablet TAKE 1 TABLET BY MOUTH EVERY NIGHT AT BEDTIME AS NEEDED FOR ANXIETY Patient not taking: Reported on 12/30/2015 09/09/15   Fayrene Helper, MD  aspirin 81 MG tablet Take 81 mg by mouth daily. Reported on 12/30/2015    Historical Provider, MD  busPIRone (BUSPAR) 5 MG tablet Take 1 tablet (5 mg total) by mouth 2 (two) times daily. 12/30/15   Fayrene Helper, MD  Calcium Carbonate-Vitamin D  (CALCIUM 600+D) 600-400 MG-UNIT per tablet Take 1 tablet by mouth daily.    Historical Provider, MD  citalopram (CELEXA) 10 MG tablet Take 1 tablet (10 mg total) by mouth daily. 12/30/15   Fayrene Helper, MD  fenofibrate (TRICOR) 145 MG tablet TAKE 1 TABLET BY MOUTH EVERY DAY Patient not taking: Reported on 12/25/2015 01/23/15   Fayrene Helper, MD  fluticasone [facility]) 50 MCG/ACT nasal spray Place 2 sprays into both nostrils daily. 08/07/15   Fayrene Helper, MD  meclizine (ANTIVERT) 25 MG tablet TAKE 1 TABLET BY MOUTH TWICE DAILY AS NEEDED 01/16/16   Fayrene Helper, MD  Multiple Vitamin (MULTIVITAMIN WITH MINERALS) TABS tablet Take 1 tablet by mouth daily.    Historical Provider, MD  nitroGLYCERIN (NITROSTAT) 0.4 MG SL tablet Place 1 tablet (0.4 mg total) under the tongue every 5 (five) minutes as needed for chest pain. One tablet under the tongue at onset of chest pains, repeat every five minutes as needed 12/31/14   Fayrene Helper, MD  omeprazole (PRILOSEC) 20 MG capsule Take 1 capsule (20 mg total) by mouth daily. 04/10/15   Fayrene Helper, MD  potassium chloride SA (KLOR-CON M20) 20 MEQ tablet Take 1 tablet (20 mEq total) by mouth daily. 05/03/15   Fayrene Helper, MD  pravastatin (PRAVACHOL) 40 MG tablet TAKE 1 TABLET BY MOUTH EVERY DAY 05/30/15   Fayrene Helper, MD  sodium chloride (OCEAN) 0.65 % SOLN nasal spray Place 2 sprays into both nostrils as needed for congestion. 11/28/15   Danie Binder, MD  triamterene-hydrochlorothiazide (MAXZIDE-25) 37.5-25 MG tablet TAKE ONE AND ONE-HALF(1.5) TABLETS BY MOUTH DAILY 12/30/15   Fayrene Helper, MD   BP 133/78 mmHg  Pulse 59  Temp(Src) 98.1 F (36.7 C) (Oral)  Resp 12  Ht 5\' 11"  (1.803 m)  Wt 156 lb (70.761 kg)  BMI 21.77 kg/m2  SpO2 98% Physical Exam  Constitutional: She is oriented to person, place, and time. She appears well-developed and well-nourished. No distress.  HENT:  Head: Normocephalic and atraumatic.   Right Ear: Hearing normal.  Left Ear: Hearing normal.  Nose: Nose normal.  Mouth/Throat: Oropharynx is clear and moist and mucous membranes are normal.  Eyes: Conjunctivae and EOM are normal. Pupils are equal, round, and reactive to light.  Neck: Normal range of motion. Neck supple.  Cardiovascular: Regular rhythm, S1 normal and S2 normal.  Exam reveals no gallop and no friction rub.   No murmur heard. Pulmonary/Chest: Effort normal and breath sounds normal. No respiratory distress. She exhibits no tenderness.  Abdominal: Soft. Normal appearance and bowel sounds are normal. There is no hepatosplenomegaly. There is no tenderness. There is no rebound, no guarding, no tenderness at McBurney's point and negative Murphy's sign. No hernia.  Musculoskeletal: Normal range of motion.  Neurological: She is alert and oriented to person, place, and time. She has normal strength. No cranial nerve deficit or sensory deficit. Coordination normal. GCS eye subscore is 4. GCS verbal subscore is 5. GCS motor subscore is 6.  Skin: Skin is warm, dry and intact. No rash noted. No cyanosis.  Psychiatric: She has a normal mood and affect. Her speech is normal and behavior is normal. Thought content normal.  Nursing note and vitals reviewed.   ED Course  Procedures (including critical care time) Labs Review Labs Reviewed  CBC WITH DIFFERENTIAL/PLATELET - Abnormal; Notable for the following:    RBC 3.72 (*)    Hemoglobin 11.2 (*)    HCT 34.3 (*)    All other components within normal limits  COMPREHENSIVE METABOLIC PANEL  LIPASE, BLOOD  URINALYSIS, ROUTINE W REFLEX MICROSCOPIC (NOT AT [facility])  TROPONIN I    Imaging Review Dg Chest 2 View  03/26/2016  CLINICAL DATA:  82 year old female with chest pain EXAM: CHEST  2 VIEW COMPARISON:  Chest radiograph dated 07/16/2012 and CT dated 07/17/2012 FINDINGS: Two views of the chest demonstrate emphysematous changes of the lungs with bibasilar atelectasis/scarring. No  focal consolidation, pleural effusion, or pneumothorax. Mild cardiomegaly. Left pectoral pacemaker device. Osteopenia with degenerative changes of the spine. No acute osseous pathology. IMPRESSION: No active cardiopulmonary disease. Electronically Signed   By: Anner Crete M.D.   On: 03/26/2016 05:54   I have personally reviewed and evaluated these images and lab results as part of my medical decision-making.   EKG Interpretation   Date/Time:  Thursday March 26 2016 05:27:32 EDT Ventricular Rate:  60 PR Interval:    QRS Duration: 118 QT Interval:  480 QTC Calculation: 480 R Axis:   -50 Text Interpretation:  Atrial-paced rhythm Left anterior fascicular block  Confirmed by POLLINA  MD, CHRISTOPHER [id number]) on 03/26/2016 5:31:52 AM      MDM   Final diagnoses:  Chest pain, unspecified chest pain type    Patient presents to the emergency department with complaints of chest pain. Patient reports taking nitroglycerin at home. She took 2 doses of nitroglycerin and then had near resolution of her chest pain, but the pain came back and she required a third dose. At arrival to the ER she had once again she had near complete resolution of her pain, only had a minimal discomfort. EKG shows atrial pacing without evidence of ischemia or infarct. Troponin reviewed. Reviewing her records reveals cardiac catheterization in 2005 that showed minimal disease, but as that was 12 years ago, patient will require observation for further cardiac rule out.    Orpah Greek, MD 03/26/16 [phone]

## 2016-03-27 DIAGNOSIS — R079 Chest pain, unspecified: Secondary | ICD-10-CM | POA: Diagnosis not present

## 2016-03-27 NOTE — Progress Notes (Signed)
Pt's IV catheter removed and intact. Pt's IV site clean dry and intact. Discharge instructions including medications and follow up appointments were reviewed and discussed with patient. Pt verbalized understanding of discharge instructions including medications and follow up appointments. All questions were answered and no further questions at this time. Pt in stable condition and in no acute distress at time of discharge. Pt escorted by nurse tech.  

## 2016-03-27 NOTE — Care Management Note (Signed)
Case Management Note  Patient Details  Name: Candace Cervantes MRN: [id number] Date of Birth: 1924-10-07  Subjective/Objective :Patient is from home with husband. She is independent with ADL's. She walks with a cane. She currently drives herself and her husband to appointments. She has family support who comes to help with household chores. Her PCP is Dr. Moshe Cipro. She reports no issues with obtaining medications.   Action/Plan: No CM needs identified.   Expected Discharge Date:  03/27/16               Expected Discharge Plan:  Home/Self Care  In-House Referral:     Discharge planning Services  CM Consult  Post Acute Care Choice:  NA Choice offered to:  NA  DME Arranged:    DME Agency:     HH Arranged:    HH Agency:     Status of Service:  Completed, signed off  If discussed at [employer] of Stay Meetings, dates discussed:    Additional Comments:  Rosie Torrez, Chauncey Reading, RN 03/27/2016, 11:53 AM

## 2016-03-27 NOTE — Discharge Summary (Signed)
Physician Discharge Summary  Candace Cervantes [id number] DOB: 17-Dec-1924 DOA: 03/26/2016  PCP: [name], MD  Admit date: 03/26/2016 Discharge date: 03/27/2016  Time spent: 45 minutes  Recommendations for Outpatient Follow-up:  -We'll be discharged home today. -Advised to follow-up with cardiologist for consideration of outpatient stress testing.   Discharge Diagnoses:  Principal Problem:   Chest pain Active Problems:   Hyperlipemia   Essential hypertension   ZENKER'S DIVERTICULUM   GERD   CAD (coronary artery disease)   Hypokalemia   Discharge Condition: Stable and improved  Filed Weights   03/26/16 0531 03/26/16 0911  Weight: 70.761 kg (156 lb) 71.668 kg (158 lb)    History of present illness:  Candace Cervantes is a 82 y.o. female with h/o CAD with a 40% RCA noted during cath in 2005, HTN, HLD, SSS s/p PPM, GERD with a Zenker's diverticulum. She started having epigastric abdominal pain this am after breakfast, after a while this migrated into her chest, she describes it as a "heavy feeling". No SOB, no palpitations, no diaphoresis. She took some NTG which helped pain somewhat but didn't completely eliminate it. Her daughter prompted her to come to the hospital for evaluation. Initial troponin is negative. EKG no acute ischemic changes. We are as ked to admit her for ACS rule out.  Hospital Course:   Chest pain -Ruled out for ACS by way of negative troponins and EKG without acute ischemic abnormalities. -Given history of coronary artery disease and risk factors, I have recommended she follow-up with her cardiologist as an outpatient for consideration of stress testing.   Procedures:  None   Consultations:  None  Discharge Instructions  Discharge Instructions    Diet - low sodium heart healthy    Complete by:  As directed      Increase activity slowly    Complete by:  As directed             Medication List    TAKE these medications        busPIRone 5 MG tablet  Commonly known as:  BUSPAR  Take 1 tablet (5 mg total) by mouth 2 (two) times daily.     citalopram 10 MG tablet  Commonly known as:  CELEXA  Take 1 tablet (10 mg total) by mouth daily.     fluticasone 50 MCG/ACT nasal spray  Commonly known as:  FLONASE  Place 2 sprays into both nostrils daily.     multivitamin with minerals Tabs tablet  Take 1 tablet by mouth daily.     nitroGLYCERIN 0.4 MG SL tablet  Commonly known as:  NITROSTAT  Place 1 tablet (0.4 mg total) under the tongue every 5 (five) minutes as needed for chest pain. One tablet under the tongue at onset of chest pains, repeat every five minutes as needed     omeprazole 20 MG capsule  Commonly known as:  PRILOSEC  Take 1 capsule (20 mg total) by mouth daily.     potassium chloride SA 20 MEQ tablet  Commonly known as:  KLOR-CON M20  Take 1 tablet (20 mEq total) by mouth daily.     pravastatin 40 MG tablet  Commonly known as:  PRAVACHOL  TAKE 1 TABLET BY MOUTH EVERY DAY     sodium chloride 0.65 % Soln nasal spray  Commonly known as:  OCEAN  Place 2 sprays into both nostrils as needed for congestion.     triamterene-hydrochlorothiazide 37.5-25 MG tablet  Commonly known as:  MAXZIDE-25  TAKE ONE AND ONE-HALF(1.5) TABLETS BY MOUTH DAILY       No Known Allergies     Follow-up Information    Follow up with [name], MD. Schedule an appointment as soon as possible for a visit in 2 weeks.   Specialty:  Family Medicine   Contact information:   [address] [place] [address] [phone]       Follow up with Cristopher Peru, MD. Schedule an appointment as soon as possible for a visit in 2 weeks.   Specialty:  Cardiology   Contact information:   [place] [address] [phone]        The results of significant diagnostics from this hospitalization (including imaging, microbiology, ancillary and laboratory) are listed below for reference.     Significant Diagnostic Studies: Dg Chest 2 View  03/26/2016  CLINICAL DATA:  82 year old female with chest pain EXAM: CHEST  2 VIEW COMPARISON:  Chest radiograph dated 07/16/2012 and CT dated 07/17/2012 FINDINGS: Two views of the chest demonstrate emphysematous changes of the lungs with bibasilar atelectasis/scarring. No focal consolidation, pleural effusion, or pneumothorax. Mild cardiomegaly. Left pectoral pacemaker device. Osteopenia with degenerative changes of the spine. No acute osseous pathology. IMPRESSION: No active cardiopulmonary disease. Electronically Signed   By: Anner Crete M.D.   On: 03/26/2016 05:54    Microbiology: No results found for this or any previous visit (from the past 240 hour(s)).   Labs: Basic Metabolic Panel:  Recent Labs Lab 03/26/16 0611 03/26/16 1515  NA 139  --   K 3.2*  --   CL 102  --   CO2 30  --   GLUCOSE 108*  --   BUN 20  --   CREATININE 1.02*  --   CALCIUM 9.5  --   MG  --  1.8   Liver Function Tests:  Recent Labs Lab 03/26/16 0611  AST 26  ALT 18  ALKPHOS 51  BILITOT 0.6  PROT 6.9  ALBUMIN 3.9    Recent Labs Lab 03/26/16 0611  LIPASE 22   No results for input(s): AMMONIA in the last 168 hours. CBC:  Recent Labs Lab 03/26/16 0611  WBC 4.2  NEUTROABS 1.9  HGB 11.2*  HCT 34.3*  MCV 92.2  PLT 165   Cardiac Enzymes:  Recent Labs Lab 03/26/16 0611 03/26/16 1249 03/26/16 1515 03/26/16 1910  TROPONINI <0.03 <0.03 <0.03 0.03   BNP: BNP (last 3 results) No results for input(s): BNP in the last 8760 hours.  ProBNP (last 3 results) No results for input(s): PROBNP in the last 8760 hours.  CBG: No results for input(s): GLUCAP in the last 168 hours.     SignedLelon Frohlich  [facility] Pager: [phone] 03/27/2016, 5:09 PM

## 2016-04-06 ENCOUNTER — Ambulatory Visit (INDEPENDENT_AMBULATORY_CARE_PROVIDER_SITE_OTHER): Payer: Medicare Other | Admitting: Family Medicine

## 2016-04-06 ENCOUNTER — Encounter: Payer: Self-pay | Admitting: Family Medicine

## 2016-04-06 VITALS — BP 148/80 | HR 67 | Resp 16 | Ht 71.0 in | Wt 159.0 lb

## 2016-04-06 DIAGNOSIS — K2100 Gastro-esophageal reflux disease with esophagitis, without bleeding: Secondary | ICD-10-CM

## 2016-04-06 DIAGNOSIS — I10 Essential (primary) hypertension: Secondary | ICD-10-CM

## 2016-04-06 DIAGNOSIS — J3089 Other allergic rhinitis: Secondary | ICD-10-CM

## 2016-04-06 DIAGNOSIS — F411 Generalized anxiety disorder: Secondary | ICD-10-CM | POA: Diagnosis not present

## 2016-04-06 DIAGNOSIS — Z09 Encounter for follow-up examination after completed treatment for conditions other than malignant neoplasm: Secondary | ICD-10-CM | POA: Insufficient documentation

## 2016-04-06 DIAGNOSIS — E785 Hyperlipidemia, unspecified: Secondary | ICD-10-CM

## 2016-04-06 MED ORDER — MECLIZINE HCL 25 MG PO TABS
25.0000 mg | ORAL_TABLET | Freq: Two times a day (BID) | ORAL | Status: DC | PRN
Start: 2016-04-06 — End: 2016-06-13

## 2016-04-06 NOTE — Patient Instructions (Addendum)
Please reschedule July 27 to end August, call if you need me sooner  Chem 7 and tSH today  PLEASE fill and take potassium one daily as prescribed  Keep appt with cardiologist  Make a conscious effort to put less demands on yourself

## 2016-04-07 LAB — BASIC METABOLIC PANEL
BUN: 23 mg/dL (ref 7–25)
CALCIUM: 10 mg/dL (ref 8.6–10.4)
CO2: 27 mmol/L (ref 20–31)
CREATININE: 0.98 mg/dL — AB (ref 0.60–0.88)
Chloride: 104 mmol/L (ref 98–110)
GLUCOSE: 95 mg/dL (ref 65–99)
Potassium: 4.2 mmol/L (ref 3.5–5.3)
SODIUM: 142 mmol/L (ref 135–146)

## 2016-04-07 LAB — TSH: TSH: 1.63 mIU/L

## 2016-04-10 ENCOUNTER — Ambulatory Visit (INDEPENDENT_AMBULATORY_CARE_PROVIDER_SITE_OTHER): Payer: Medicare Other | Admitting: Adult Health

## 2016-04-10 ENCOUNTER — Encounter: Payer: Self-pay | Admitting: Adult Health

## 2016-04-10 VITALS — BP 124/70 | HR 64 | Ht 71.0 in | Wt 159.0 lb

## 2016-04-10 DIAGNOSIS — R0789 Other chest pain: Secondary | ICD-10-CM | POA: Diagnosis not present

## 2016-04-10 DIAGNOSIS — I10 Essential (primary) hypertension: Secondary | ICD-10-CM | POA: Diagnosis not present

## 2016-04-10 DIAGNOSIS — I2510 Atherosclerotic heart disease of native coronary artery without angina pectoris: Secondary | ICD-10-CM | POA: Diagnosis not present

## 2016-04-10 MED ORDER — TRIAMTERENE-HCTZ 37.5-25 MG PO TABS: 1.0000 | ORAL_TABLET | Freq: Every day | ORAL | Status: DC

## 2016-04-10 NOTE — Progress Notes (Signed)
Name: Candace Cervantes    DOB: Jul 05, 1925  Age: 82 y.o.  MR#: [id number]       PCP:  [name], MD      Insurance: Payor: [employer] MEDICARE / Plan: [facility] MEDICARE / Product Type: *No Product type* /   CC:   No chief complaint on file.   VS Filed Vitals:   04/10/16 1435  BP: 124/70  Pulse: 64  Height: 5\' 11"  (1.803 m)  Weight: 159 lb (72.122 kg)  SpO2: 97%    Weights Current Weight  04/10/16 159 lb (72.122 kg)  04/06/16 159 lb (72.122 kg)  03/26/16 158 lb (71.668 kg)    Blood Pressure  BP Readings from Last 3 Encounters:  04/10/16 124/70  04/06/16 148/80  03/27/16 148/54     Admit date:  (Not on file) Last encounter with RMR:  Visit date not found   Allergy Review of patient's allergies indicates no known allergies.  Current Outpatient Prescriptions  Medication Sig Dispense Refill  . busPIRone (BUSPAR) 5 MG tablet Take 1 tablet (5 mg total) by mouth 2 (two) times daily. 60 tablet 4  . citalopram (CELEXA) 10 MG tablet Take 1 tablet (10 mg total) by mouth daily. 90 tablet 1  . fluticasone (FLONASE) 50 MCG/ACT nasal spray Place 2 sprays into both nostrils daily. 16 g 2  . meclizine (ANTIVERT) 25 MG tablet Take 1 tablet (25 mg total) by mouth 2 (two) times daily as needed. 40 tablet 0  . Multiple Vitamin (MULTIVITAMIN WITH MINERALS) TABS tablet Take 1 tablet by mouth daily.    . nitroGLYCERIN (NITROSTAT) 0.4 MG SL tablet Place 1 tablet (0.4 mg total) under the tongue every 5 (five) minutes as needed for chest pain. One tablet under the tongue at onset of chest pains, repeat every five minutes as needed 25 tablet 1  . omeprazole (PRILOSEC) 20 MG capsule Take 1 capsule (20 mg total) by mouth daily. 90 capsule 1  . potassium chloride SA (KLOR-CON M20) 20 MEQ tablet Take 1 tablet (20 mEq total) by mouth daily. 30 tablet 5  . pravastatin (PRAVACHOL) 40 MG tablet TAKE 1 TABLET BY MOUTH EVERY DAY 30 tablet 1  . sodium chloride (OCEAN) 0.65 % SOLN nasal spray Place 2 sprays  into both nostrils as needed for congestion. 104 mL 1  . triamterene-hydrochlorothiazide (MAXZIDE-25) 37.5-25 MG tablet TAKE ONE AND ONE-HALF(1.5) TABLETS BY MOUTH DAILY 135 tablet 1   No current facility-administered medications for this visit.    Discontinued Meds:   There are no discontinued medications.  Patient Active Problem List   Diagnosis Date Noted  . Hospital discharge follow-up 04/06/2016  . Chest pain 03/26/2016  . Hypokalemia 03/26/2016  . Medicare annual wellness visit, subsequent 12/25/2015  . Abdominal pain 08/07/2015  . Depression with anxiety 04/21/2015  . Rash and nonspecific skin eruption 04/17/2015  . Constipation 10/19/2014  . IGT (impaired glucose tolerance) 09/27/2014  . Unintended weight loss 08/06/2014  . Osteopenia 02/20/2014  . Anemia of chronic disease 09/15/2013  . Atrial fibrillation ([place]) 05/16/2013  . Sinoatrial node dysfunction ([facility])   . CAD (coronary artery disease) 08/10/2011  . Cardiac pacemaker in situ 08/10/2011  . MCI (mild cognitive impairment) 12/15/2008  . Allergic rhinitis 09/20/2008  . ZENKER'S DIVERTICULUM 06/14/2008  . GERD 06/14/2008  . Dysphagia 06/14/2008  . Hyperlipemia 02/15/2008  . ANXIETY DISORDER, GENERALIZED 02/15/2008  . Essential hypertension 02/15/2008  . OSTEOARTHRITIS 02/15/2008    LABS    Component Value Date/Time  NA 142 04/06/2016 1415   NA 139 03/26/2016 0611   NA 140 12/31/2015 1059   K 4.2 04/06/2016 1415   K 3.2* 03/26/2016 0611   K 3.9 12/31/2015 1059   CL 104 04/06/2016 1415   CL 102 03/26/2016 0611   CL 103 12/31/2015 1059   CO2 27 04/06/2016 1415   CO2 30 03/26/2016 0611   CO2 32* 12/31/2015 1059   GLUCOSE 95 04/06/2016 1415   GLUCOSE 108* 03/26/2016 0611   GLUCOSE 92 12/31/2015 1059   BUN 23 04/06/2016 1415   BUN 20 03/26/2016 0611   BUN 18 12/31/2015 1059   CREATININE 0.98* 04/06/2016 1415   CREATININE 1.02* 03/26/2016 0611   CREATININE 0.99* 12/31/2015 1059   CREATININE 1.10*  08/15/2015 0939   CREATININE 1.04* 06/17/2015 1319   CREATININE 1.10 07/17/2012 0511   CALCIUM 10.0 04/06/2016 1415   CALCIUM 9.5 03/26/2016 0611   CALCIUM 9.8 12/31/2015 1059   GFRNONAA 47* 03/26/2016 0611   GFRNONAA 51* 12/31/2015 1059   GFRNONAA 40* 08/06/2014 1407   GFRNONAA 47* 10/26/2013 1201   GFRNONAA 44* 07/17/2012 0511   GFRNONAA 46* 07/16/2012 0720   GFRAA 55* 03/26/2016 0611   GFRAA 59* 12/31/2015 1059   GFRAA 46* 08/06/2014 1407   GFRAA 54* 10/26/2013 1201   GFRAA 52* 07/17/2012 0511   GFRAA 53* 07/16/2012 0720   CMP     Component Value Date/Time   NA 142 04/06/2016 1415   K 4.2 04/06/2016 1415   CL 104 04/06/2016 1415   CO2 27 04/06/2016 1415   GLUCOSE 95 04/06/2016 1415   BUN 23 04/06/2016 1415   CREATININE 0.98* 04/06/2016 1415   CREATININE 1.02* 03/26/2016 0611   CALCIUM 10.0 04/06/2016 1415   PROT 6.9 03/26/2016 0611   ALBUMIN 3.9 03/26/2016 0611   AST 26 03/26/2016 0611   ALT 18 03/26/2016 0611   ALKPHOS 51 03/26/2016 0611   BILITOT 0.6 03/26/2016 0611   GFRNONAA 47* 03/26/2016 0611   GFRNONAA 51* 12/31/2015 1059   GFRAA 55* 03/26/2016 0611   GFRAA 59* 12/31/2015 1059       Component Value Date/Time   WBC 4.2 03/26/2016 0611   WBC 3.8* 06/17/2015 1319   WBC 4.9 10/19/2014 1314   HGB 11.2* 03/26/2016 0611   HGB 11.1* 06/17/2015 1319   HGB 11.7* 10/19/2014 1314   HCT 34.3* 03/26/2016 0611   HCT 33.5* 06/17/2015 1319   HCT 34.3* 10/19/2014 1314   MCV 92.2 03/26/2016 0611   MCV 92.3 06/17/2015 1319   MCV 89.6 10/19/2014 1314    Lipid Panel     Component Value Date/Time   CHOL 202* 12/31/2015 1059   TRIG 52 12/31/2015 1059   HDL 101 12/31/2015 1059   CHOLHDL 2.0 12/31/2015 1059   VLDL 10 12/31/2015 1059   LDLCALC 91 12/31/2015 1059    ABG No results found for: PHART, PCO2ART, PO2ART, HCO3, TCO2, ACIDBASEDEF, O2SAT   Lab Results  Component Value Date   TSH 1.63 04/06/2016   BNP (last 3 results) No results for input(s): BNP in  the last 8760 hours.  ProBNP (last 3 results) No results for input(s): PROBNP in the last 8760 hours.  Cardiac Panel (last 3 results) No results for input(s): CKTOTAL, CKMB, TROPONINI, RELINDX in the last 72 hours.  Iron/TIBC/Ferritin/ %Sat    Component Value Date/Time   IRON 87 06/17/2015 1319   TIBC 329 10/19/2014 1314   FERRITIN 116 06/17/2015 1319   IRONPCTSAT 9* 10/19/2014 1314  EKG Orders placed or performed during the hospital encounter of 03/26/16  . EKG  . EKG 12-Lead (at 6am)  . EKG 12-Lead (at 6am)     Prior Assessment and Plan Problem List as of 04/10/2016      Cardiovascular and Mediastinum   Essential hypertension   Last Assessment & Plan 08/07/2015 Office Visit Written 08/11/2015  7:38 PM by Fayrene Helper, MD    Controlled, no change in medication       CAD (coronary artery disease)   Last Assessment & Plan 04/11/2012 Office Visit Written 04/11/2012  2:00 PM by Evans Lance, MD    She has had some symptoms which are not related to exertion. I have asked her to call me if she has exertional chest pain.      Sinoatrial node dysfunction [facility])   Atrial fibrillation [facility])   Last Assessment & Plan 05/06/2015 Office Visit Written 05/06/2015 11:34 AM by Evans Lance, MD    She has maintained NSR 99% of the time. She is not a candidate for systemic anticoagulation due to her h/o falls.         Respiratory   Allergic rhinitis   Last Assessment & Plan 11/28/2015 Office Visit Written 11/28/2015 11:44 AM by Danie Binder, MD    SYMPTOMS NOT CONTROLLED.  USE SALINE SPRAY OR DROPS TO HELP WITH NIGHT TIME NASAL CONGESTION. SENT PRESCRIPTION TO [facility]. [place] FLONASE IF NEEDED FOR SNEEZING AND CONGESTION. PLEASE CALL WITH QUESTIONS OR CONCERNS. FOLLOW UP IN 1 YEAR.        Digestive   ZENKER'S DIVERTICULUM   Last Assessment & Plan 11/28/2015 Office Visit Written 11/28/2015 11:47 AM by Danie Binder, MD    [place].  NO ADDITIONAL WORKUP  NEEDED AT THIS TIME.       GERD   Last Assessment & Plan 05/21/2015 Office Visit Written 05/21/2015  9:52 AM by Danie Binder, MD    SYMPTOMS CONTROLLED/RESOLVED.  CONTINUE TO MONITOR SYMPTOMS.       Dysphagia   Last Assessment & Plan 11/28/2015 Office Visit Written 11/28/2015 11:44 AM by Danie Binder, MD    ZENKER'S REPAIRED [place] 2016. SYMPTOMS CONTROLLED/RESOLVED.  CONTINUE TO MONITOR SYMPTOMS. PLEASE CALL WITH QUESTIONS OR CONCERNS. FOLLOW UP IN 1 YEAR.      Constipation   Last Assessment & Plan 05/21/2015 Office Visit Written 05/21/2015  9:51 AM by Danie Binder, MD    SYMPTOMS CONTROLLED/RESOLVED.  CONTINUE TO MONITOR SYMPTOMS.         Endocrine   IGT (impaired glucose tolerance)   Last Assessment & Plan 04/17/2015 Office Visit Edited 04/21/2015  5:22 PM by Fayrene Helper, MD    Patient educated about the importance of limiting  Carbohydrate intake , the need to commit to daily physical activity for a minimum of 30 minutes , and to commit weight loss. The fact that changes in all these areas will reduce or eliminate all together the development of diabetes is stressed.  Updated lab needed at/ before next visit.   Diabetic Labs Latest Ref Rng 08/06/2014 02/06/2014 10/26/2013 07/21/2013 02/17/2013  HbA1c <5.7 % 6.1(H) - 5.6 6.1(H) 5.7(H)  Chol 0 - 200 mg/dL 151 223(H) 239(H) - -  HDL >39 mg/dL 69 82 79 - -  Calc LDL 0 - 99 mg/dL 72 130(H) 140(H) - -  Triglycerides <150 mg/dL 51 54 100 - -  Creatinine 0.50 - 1.10 mg/dL 1.22(H) 1.15(H) 1.08 1.02 1.17(H)  BP/Weight 04/17/2015 04/11/2015 03/07/2015 01/17/2015 11/14/2014 10/19/2014 0000000  Systolic BP [id number] 123456 [id number] [id number] [id number]  Diastolic BP 74 66 60 75 72 57 78  Wt. (Lbs) 153.4 153.4 155 156.4 157 156.6 151  BMI 21.4 21.4 21.63 21.82 20.72 21.85 19.93   No flowsheet data found.           Nervous and Auditory   MCI (mild cognitive impairment)   Last Assessment & Plan 02/08/2014 Office Visit Written 02/11/2014  7:25 PM  by Fayrene Helper, MD    Doing well mentally, pt primarily challenged by ongoing responsibilities of living some of which she should pass on to her children who she states are both willing, also the fact that her spouse has severe dementia is an added stress which she is handling as well as can be expected Has had medication in th e[past for memory loss but no indicatio that this is indicated at this time        Musculoskeletal and [place] 07/26/2013 Office Visit Written 07/26/2013 10:40 PM by Fayrene Helper, MD    Continue celebrex      Osteopenia   Rash and nonspecific skin eruption   Last Assessment & Plan 04/17/2015 Office Visit Written 04/21/2015  5:21 PM by Fayrene Helper, MD    Allergic dermatitis Depo medrol in office followed by short course of prednisone, will refer to dermatology if persists        Other   Hyperlipemia   Last Assessment & Plan 08/07/2015 Office Visit Written 08/11/2015  7:40 PM by Fayrene Helper, MD    Hyperlipidemia:Low fat diet discussed and encouraged.   Lipid Panel  Lab Results  Component Value Date   CHOL 181 06/17/2015   HDL 81 06/17/2015   LDLCALC 86 06/17/2015   TRIG 71 06/17/2015   CHOLHDL 2.2 06/17/2015   Controlled, no change in medication          ANXIETY DISORDER, GENERALIZED   Last Assessment & Plan 04/17/2015 Office Visit Written 04/21/2015  5:25 PM by Fayrene Helper, MD    Controlled, no change in medication       Cardiac pacemaker in situ   Last Assessment & Plan 05/06/2015 Office Visit Written 05/06/2015 11:36 AM by Evans Lance, MD    Her [employer] DDD PM is working normally. Will recheck in several months.       Anemia of chronic disease   Last Assessment & Plan 09/15/2013 Office Visit Written 09/15/2013  1:46 PM by Mahala Menghini, PA-C    Chronic anemia with normal MCV and anemia panel c/w anemia of chronic disease. Patient declines colonoscopy. Offer  ifobt.      Unintended weight loss   Last Assessment & Plan 08/07/2015 Office Visit Written 08/11/2015  7:37 PM by Fayrene Helper, MD    unintentional weight loss and worsening , localized LLQ pain, ct abdomen asap      Depression with anxiety   Last Assessment & Plan 08/07/2015 Office Visit Written 08/11/2015  7:40 PM by Fayrene Helper, MD    Controlled, no change in medication       Abdominal pain   Last Assessment & Plan 08/07/2015 Office Visit Written 08/11/2015  7:36 PM by Fayrene Helper, MD    1 year h/o progressively worsening LLQ pain, at times disturbing sleep. Abnormal exam, with guarding and tenderness in area of concern, needs imaging study  Medicare annual wellness visit, subsequent   Last Assessment & Plan 12/25/2015 Office Visit Written 12/25/2015  3:59 PM by Fayrene Helper, MD    Annual exam as documented. Counseling done  re healthy lifestyle involving commitment to 150 minutes exercise per week, heart healthy diet, and attaining healthy weight.The importance of adequate sleep also discussed. Regular seat belt use and home safety, is also discussed. Changes in health habits are decided on by the patient with goals and time frames  set for achieving them. Immunization and cancer screening needs are specifically addressed at this visit.       Chest pain   Hypokalemia   Hospital discharge follow-up       Imaging: Dg Chest 2 View  03/26/2016  CLINICAL DATA:  82 year old female with chest pain EXAM: CHEST  2 VIEW COMPARISON:  Chest radiograph dated 07/16/2012 and CT dated 07/17/2012 FINDINGS: Two views of the chest demonstrate emphysematous changes of the lungs with bibasilar atelectasis/scarring. No focal consolidation, pleural effusion, or pneumothorax. Mild cardiomegaly. Left pectoral pacemaker device. Osteopenia with degenerative changes of the spine. No acute osseous pathology. IMPRESSION: No active cardiopulmonary disease. Electronically Signed   By:  Anner Crete M.D.   On: 03/26/2016 05:54

## 2016-04-10 NOTE — Patient Instructions (Signed)
Your physician wants you to follow-up in: 6 Months with Dr. Lovena Le. You will receive a reminder letter in the mail two months in advance. If you don't receive a letter, please call our office to schedule the follow-up appointment.  Your physician recommends that you schedule a follow-up appointment in: 2-4 Weeks for Blood Pressure Check.  Your physician has recommended you make the following change in your medication:   Decrease Maxzide to 1 Tablet Daily  If you need a refill on your cardiac medications before your next appointment, please call your pharmacy.  Thank you for choosing [facility]!

## 2016-04-10 NOTE — Progress Notes (Signed)
Cardiology Office Note   Date:  04/10/2016   ID:  Candace Cervantes, Candace Cervantes December 08, 1924, MRN [id number]  PCP:  [name], MD  Cardiologist: Bryna Colander, NP   No chief complaint on file.     History of Present Illness: Candace Cervantes is a 82 y.o. female who presents for ongoing assessment and management of symptomatic bradycardia, status post pacemaker insertion, atrial fibrillation, hypertension, and CAD. She was last seen by Dr. Lovena Le in August of 2016.interrogation of pacemaker revealed that she was in normal sinus rhythm 99% of the time and was not a candidate for systemic anticoagulation due to 2 her history of falls.  Unfortunately, she is noted to the hospital with complaints of chest pain, she was ruled out for ACS by negative enzymes and EKG. On discharge to discuss with had to repeat stress test to evaluate for progression of CAD, should she continue with ongoing discomfort.  She has since been placed on proton takes 20 mg daily and has had a significant improvement in her discomfort. I am seeing her today alongside her husband for a combined appointment. She continues to be under a lot of stress caring for her husband with Alzheimer's. She talks openly about the amount of pressure she is under. She denies any recurrent dyspnea or fatigue. She states that her daughter from [place] came to visit and has arranged Meals on Wheels for her and her husband. This has helped tremendously and she has one last meal to cook..   Past Medical History  Diagnosis Date  . Pacemaker 2002    Permanent Placement  . SSS (sick sinus syndrome) ([facility])   . Pacemaker 2007    Generator Changed   . Hyperlipidemia   . Hypertension   . CAD (coronary artery disease) 2005    Nonobstructive 40% RCA lesion and normal ejection fraction at cath   . Gastroesophageal reflux disease   . Osteoporosis   . Anxiety disorder   . Osteoarthritis   . Sinoatrial node dysfunction (HCC)   . Zenker  diverticulum     previously evaluated by Dr. Erik Obey in [id number].     Past Surgical History  Procedure Laterality Date  . Total abdominal hysterectomy  2002  . Cholecystectomy    . Cystectomy      from back of the neck  . Pacemaker insertion  2002  . Pacemaker placement  2007    replacement   . Esophagogastroduodenoscopy  2008    Dr. Oneida Alar, difficulty passing scope through UES, incomplete fibrous ring at GEJ, hh, multiple benign gastri polyps  . Colonoscopy  2003    no polyps.  . Esophagogastroduodenoscopy N/A 10/17/2013    ZENKER'S, STRICTURE: 10-12.8 MM,  NSAID GASTRITIS FG POLYPS  . [place]ory dilation N/A 10/17/2013    Procedure: SAVORY DILATION;  Surgeon: Danie Binder, MD;  Location: [facility];  Service: Endoscopy;  Laterality: N/A;  with PEDS GASTROSCOPE  . Maloney dilation N/A 10/17/2013    Procedure: Venia Minks DILATION;  Surgeon: Danie Binder, MD;  Location: [facility];  Service: Endoscopy;  Laterality: N/A;  with PEDS GASTROSCOPE  . Esophagogastroduodenoscopy ([facility]) with esophageal dilation N/A 10/30/2013    SLF: 1. Zenkers diverticulum with a large opening at the cricopharyngeus 2. 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 Innumerable Fundic gland polyps 4. Mild NSAID gastritis     Current Outpatient Prescriptions  Medication Sig Dispense Refill  . busPIRone (BUSPAR) 5 MG tablet Take 1 tablet (5 mg total) by mouth  2 (two) times daily. 60 tablet 4  . citalopram (CELEXA) 10 MG tablet Take 1 tablet (10 mg total) by mouth daily. 90 tablet 1  . fluticasone (FLONASE) 50 MCG/ACT nasal spray Place 2 sprays into both nostrils daily. 16 g 2  . meclizine (ANTIVERT) 25 MG tablet Take 1 tablet (25 mg total) by mouth 2 (two) times daily as needed. 40 tablet 0  . Multiple Vitamin (MULTIVITAMIN WITH MINERALS) TABS tablet Take 1 tablet by mouth daily.    . nitroGLYCERIN (NITROSTAT) 0.4 MG SL tablet Place 1 tablet (0.4 mg total) under the tongue every 5 (five) minutes as needed for chest pain. One  tablet under the tongue at onset of chest pains, repeat every five minutes as needed 25 tablet 1  . omeprazole (PRILOSEC) 20 MG capsule Take 1 capsule (20 mg total) by mouth daily. 90 capsule 1  . potassium chloride SA (KLOR-CON M20) 20 MEQ tablet Take 1 tablet (20 mEq total) by mouth daily. 30 tablet 5  . pravastatin (PRAVACHOL) 40 MG tablet TAKE 1 TABLET BY MOUTH EVERY DAY 30 tablet 1  . sodium chloride (OCEAN) 0.65 % SOLN nasal spray Place 2 sprays into both nostrils as needed for congestion. 104 mL 1  . triamterene-hydrochlorothiazide (MAXZIDE-25) 37.5-25 MG tablet Take 1 tablet by mouth daily. 30 tablet 11   No current facility-administered medications for this visit.    Allergies:   Review of patient's allergies indicates no known allergies.    Social History:  The patient  reports that she has never smoked. She has never used smokeless tobacco. She reports that she does not drink alcohol or use illicit drugs.   Family History:  The patient's family history includes Alcohol abuse in her brother; Cancer in her brother; Colon cancer in her other; Heart disease in her mother; Lung disease in her sister.    ROS: All other systems are reviewed and negative. Unless otherwise mentioned in H&P    PHYSICAL EXAM: VS:  BP 124/70 mmHg  Pulse 64  Ht 5\' 11"  (1.803 m)  Wt 159 lb (72.122 kg)  BMI 22.19 kg/m2  SpO2 97% , BMI Body mass index is 22.19 kg/(m^2). GEN: Well nourished, well developed, in no acute distress HEENT: normal Neck: no JVD, carotid bruits, or masses Cardiac: RRR; no murmurs, rubs, or gallops,no edema  Respiratory:  clear to auscultation bilaterally, normal work of breathing GI: soft, nontender, nondistended, + BS MS: no deformity or atrophy Skin: warm and dry, no rash Neuro:  Strength and sensation are intact Psych: euthymic mood, full affec  Recent Labs: 03/26/2016: ALT 18; Hemoglobin 11.2*; Magnesium 1.8; Platelets 165 04/06/2016: BUN 23; Creat 0.98*; Potassium 4.2;  Sodium 142; TSH 1.63    Lipid Panel    Component Value Date/Time   CHOL 202* 12/31/2015 1059   TRIG 52 12/31/2015 1059   HDL 101 12/31/2015 1059   CHOLHDL 2.0 12/31/2015 1059   VLDL 10 12/31/2015 1059   LDLCALC 91 12/31/2015 1059      Wt Readings from Last 3 Encounters:  04/10/16 159 lb (72.122 kg)  04/06/16 159 lb (72.122 kg)  03/26/16 158 lb (71.668 kg)    ASSESSMENT AND PLAN:  1. Chest discomfort: Significantly improved with use of PPI for treatment of GERD.Ivanoff for a followup LexiScan Myoview for evaluation of progressive CAD but she is refusing at this time as she is asymptomatic.  2. Hypertension:she is feeling lightheaded with standing and is a little unsteady on her feet. Although she is  on meclizine for this. I will adjust her Maxzide to one tablet daily instead of one and one half tablet daily to assist with mild hypotension. This may help with some of her dizziness.  3. History of coronary artery disease:she refuses any further cardiac testing at this time.    I have expressed my concerns to her abouther own health and the need to take care of her husband who has Alzheimer's. I really feel as if she needs to be an assisted-living facility to assist her with her own health and medications at along with her husbands. She has a lot on her shoulders taking care of him and maintaining her own health. She is not willing to consider this option at this time.   Current medicines are reviewed at length with the patient today.    Labs/ tests ordered today include:  No orders of the defined types were placed in this encounter.     Disposition:   FU with  6 months  Signed, Jory Sims, NP  04/10/2016 3:48 PM    [facility] [address], [place], [place] [address] Phone: [phone]; Fax: [phone]

## 2016-04-11 NOTE — Assessment & Plan Note (Signed)
Controlled, no change in medication  

## 2016-04-11 NOTE — Assessment & Plan Note (Signed)
No current chest pain, however notes increased fatigue and poor exercise tolerance, has cardiology follow up of recent hospitalization for chest pain and further cardiology testing will be determined at that time , as indicated Pt needs to also get some relief from caring for her spouse with severe dementia and poor health

## 2016-04-11 NOTE — Assessment & Plan Note (Signed)
Coping very well with support of case workers and her daughter . No medication change needed atr this time, but she does need additional help caring for her spouse, she agrees and is asking about adult day care  For him, advised her to discuss with case worker

## 2016-04-11 NOTE — Assessment & Plan Note (Signed)
Recent chest pain that hospitalized her had negative cardiac workup in hospital and pain likely due to GERD , recently underwent procedure at [facility] to treat reflux

## 2016-04-11 NOTE — Progress Notes (Signed)
   Subjective:    Patient ID: Candace Cervantes, female    DOB: 22-Jan-1925, 82 y.o.   MRN: [id number]  HPI  Patient in for follow up of recent hospitalization. Discharge summary, and laboratory and radiology data are reviewed, and any questions or concerns about recent hospitalization are discussed. Specific issues requiring follow up are specifically addressed.   Review of Systems See HPI Denies recent fever or chills. Denies sinus pressure, nasal congestion, ear pain or sore throat. Denies chest congestion, productive cough or wheezing.  Denies abdominal pain, nausea, vomiting,diarrhea or constipation.   Denies dysuria, frequency, hesitancy or incontinence. Denies uncontrolled  joint pain, swelling and limitation in mobility. Denies headaches, seizures, numbness, or tingling. Denies depression,  Uncontrolled anxiety or insomnia. Denies skin break down or rash.         Objective:   Physical Exam  BP 148/80 mmHg  Pulse 67  Resp 16  Ht 5\' 11"  (1.803 m)  Wt 159 lb (72.122 kg)  BMI 22.19 kg/m2  SpO2 96% Patient alert and oriented and in no cardiopulmonary distress.  HEENT: No facial asymmetry, EOMI,   oropharynx pink and moist.  Neck supple no JVD, no mass.  Chest: Clear to auscultation bilaterally.  CVS: S1, S2 no murmurs, no S3.Regular rate.  ABD: Soft non tender.   Ext: No edema  MS: Adequate though reduced  ROM spine, shoulders, hips and knees.  Skin: Intact, no ulcerations or rash noted.  Psych: Good eye contact, normal affect. Memory mildly impaired not anxious or depressed appearing.  CNS: CN 2-12 intact, power,  normal throughout.no focal deficits noted.       Assessment & Plan:  Hospital discharge follow-up No current chest pain, however notes increased fatigue and poor exercise tolerance, has cardiology follow up of recent hospitalization for chest pain and further cardiology testing will be determined at that time , as indicated Pt needs to also get  some relief from caring for her spouse with severe dementia and poor health  Essential hypertension Controlled, no change in medication   GERD Recent chest pain that hospitalized her had negative cardiac workup in hospital and pain likely due to GERD , recently underwent procedure at [facility] to treat reflux  ANXIETY DISORDER, GENERALIZED Coping very well with support of case workers and her daughter . No medication change needed atr this time, but she does need additional help caring for her spouse, she agrees and is asking about adult day care  For him, advised her to discuss with case worker   Hyperlipemia Hyperlipidemia:Low fat diet discussed and encouraged.   Lipid Panel  Lab Results  Component Value Date   CHOL 202* 12/31/2015   HDL 101 12/31/2015   LDLCALC 91 12/31/2015   TRIG 52 12/31/2015   CHOLHDL 2.0 12/31/2015   Adequate control, no med change   \  Allergic rhinitis Controlled, no change in medication

## 2016-04-11 NOTE — Assessment & Plan Note (Signed)
Hyperlipidemia:Low fat diet discussed and encouraged.   Lipid Panel  Lab Results  Component Value Date   CHOL 202* 12/31/2015   HDL 101 12/31/2015   LDLCALC 91 12/31/2015   TRIG 52 12/31/2015   CHOLHDL 2.0 12/31/2015   Adequate control, no med change   \

## 2016-04-24 ENCOUNTER — Encounter (INDEPENDENT_AMBULATORY_CARE_PROVIDER_SITE_OTHER): Payer: Self-pay

## 2016-04-24 ENCOUNTER — Ambulatory Visit (INDEPENDENT_AMBULATORY_CARE_PROVIDER_SITE_OTHER): Payer: Medicare Other

## 2016-04-24 VITALS — BP 138/68 | HR 69

## 2016-04-24 DIAGNOSIS — Z87898 Personal history of other specified conditions: Secondary | ICD-10-CM

## 2016-04-24 DIAGNOSIS — R42 Dizziness and giddiness: Secondary | ICD-10-CM | POA: Insufficient documentation

## 2016-04-24 NOTE — Patient Instructions (Signed)
Continue medications as directed. Follow up as planned

## 2016-04-24 NOTE — Progress Notes (Signed)
BP is much better

## 2016-04-24 NOTE — Progress Notes (Signed)
Patient arrives for f/u BP after Maxide 37.5- 25 mg was reduced to 1 tablet daily. States she has no diziness now or no unsteadiness noted.Says she is drinking Ensure    Will forward to Ms Purcell Nails NP for disposition

## 2016-04-30 ENCOUNTER — Other Ambulatory Visit: Payer: Self-pay | Admitting: Family Medicine

## 2016-04-30 ENCOUNTER — Ambulatory Visit: Payer: Medicare Other | Admitting: Family Medicine

## 2016-05-18 ENCOUNTER — Encounter: Payer: Medicare Other | Admitting: Internal Medicine

## 2016-05-21 ENCOUNTER — Other Ambulatory Visit: Payer: Self-pay | Admitting: Family Medicine

## 2016-06-02 ENCOUNTER — Ambulatory Visit (INDEPENDENT_AMBULATORY_CARE_PROVIDER_SITE_OTHER): Payer: Medicare Other | Admitting: Family Medicine

## 2016-06-02 ENCOUNTER — Encounter: Payer: Self-pay | Admitting: Family Medicine

## 2016-06-02 VITALS — BP 132/80 | HR 67 | Resp 16 | Ht 71.0 in | Wt 163.0 lb

## 2016-06-02 DIAGNOSIS — E559 Vitamin D deficiency, unspecified: Secondary | ICD-10-CM

## 2016-06-02 DIAGNOSIS — Z23 Encounter for immunization: Secondary | ICD-10-CM

## 2016-06-02 DIAGNOSIS — E785 Hyperlipidemia, unspecified: Secondary | ICD-10-CM

## 2016-06-02 DIAGNOSIS — F411 Generalized anxiety disorder: Secondary | ICD-10-CM

## 2016-06-02 DIAGNOSIS — M8580 Other specified disorders of bone density and structure, unspecified site: Secondary | ICD-10-CM

## 2016-06-02 DIAGNOSIS — R7302 Impaired glucose tolerance (oral): Secondary | ICD-10-CM

## 2016-06-02 DIAGNOSIS — J3089 Other allergic rhinitis: Secondary | ICD-10-CM

## 2016-06-02 DIAGNOSIS — I10 Essential (primary) hypertension: Secondary | ICD-10-CM | POA: Diagnosis not present

## 2016-06-02 DIAGNOSIS — K2100 Gastro-esophageal reflux disease with esophagitis, without bleeding: Secondary | ICD-10-CM

## 2016-06-02 LAB — COMPREHENSIVE METABOLIC PANEL
ALT: 14 U/L (ref 6–29)
AST: 20 U/L (ref 10–35)
Albumin: 4.2 g/dL (ref 3.6–5.1)
Alkaline Phosphatase: 67 U/L (ref 33–130)
BILIRUBIN TOTAL: 0.4 mg/dL (ref 0.2–1.2)
BUN: 19 mg/dL (ref 7–25)
CALCIUM: 9.7 mg/dL (ref 8.6–10.4)
CO2: 29 mmol/L (ref 20–31)
CREATININE: 1.03 mg/dL — AB (ref 0.60–0.88)
Chloride: 104 mmol/L (ref 98–110)
GLUCOSE: 97 mg/dL (ref 65–99)
Potassium: 3.9 mmol/L (ref 3.5–5.3)
SODIUM: 141 mmol/L (ref 135–146)
Total Protein: 7.2 g/dL (ref 6.1–8.1)

## 2016-06-02 LAB — HEMOGLOBIN A1C
HEMOGLOBIN A1C: 5.9 % — AB (ref <5.7)
MEAN PLASMA GLUCOSE: 123 mg/dL

## 2016-06-02 LAB — LIPID PANEL
Cholesterol: 195 mg/dL (ref 125–200)
HDL: 94 mg/dL (ref >=46)
LDL CALC: 91 mg/dL (ref <130)
Total CHOL/HDL Ratio: 2.1 Ratio (ref <=5.0)
Triglycerides: 50 mg/dL (ref <150)
VLDL: 10 mg/dL (ref <30)

## 2016-06-02 NOTE — Patient Instructions (Addendum)
Annual exam in early December, call if you need me before   Labs today HBa1C,, lipid, cmp , Vit D today.  Flu vaccine today   Thanks for choosing [facility], we consider it a privelige to serve you.

## 2016-06-03 LAB — VITAMIN D 25 HYDROXY (VIT D DEFICIENCY, FRACTURES): VIT D 25 HYDROXY: 38 ng/mL (ref 30–100)

## 2016-06-08 ENCOUNTER — Encounter: Payer: Self-pay | Admitting: Family Medicine

## 2016-06-08 DIAGNOSIS — Z23 Encounter for immunization: Secondary | ICD-10-CM | POA: Insufficient documentation

## 2016-06-08 NOTE — Assessment & Plan Note (Signed)
Controlled, no change in medication Followed  By GI also

## 2016-06-08 NOTE — Assessment & Plan Note (Signed)
Controlled, no change in medication  

## 2016-06-08 NOTE — Progress Notes (Signed)
   Candace Cervantes     MRN: [id number]      DOB: [birth year]   HPI [name] is here for follow up and re-evaluation of chronic medical conditions, medication management and review of any available recent lab and radiology data.  Preventive health is updated, specifically  Cancer screening and Immunization.   Questions or concerns regarding consultations or procedures which the PT has had in the interim are  addressed. The PT denies any adverse reactions to current medications since the last visit.  There are no new concerns.  There are no specific complaints   ROS Denies recent fever or chills. Denies sinus pressure, nasal congestion, ear pain or sore throat. Denies chest congestion, productive cough or wheezing. Denies chest pains, palpitations and leg swelling Denies abdominal pain, nausea, vomiting,diarrhea or constipation.   Denies dysuria, frequency, hesitancy or incontinence. Denies joint pain, swelling and limitation in mobility. Denies headaches, seizures, numbness, or tingling. Denies depression, anxiety or insomnia. Denies skin break down or rash.   PE  BP 132/80   Pulse 67   Resp 16   Ht 5\' 11"  (1.803 m)   Wt 163 lb (73.9 kg)   SpO2 95%   BMI 22.73 kg/m   Patient alert and oriented and in no cardiopulmonary distress.  HEENT: No facial asymmetry, EOMI,   oropharynx pink and moist.  Neck supple no JVD, no mass.  Chest: Clear to auscultation bilaterally.  CVS: S1, S2 no murmurs, no S3.Regular rate.  ABD: Soft non tender.   Ext: No edema  [id number]  ROM spine, shoulders, hips and knees.  Skin: Intact, no ulcerations or rash noted.  Psych: Good eye contact, normal affect. Memory intact not anxious or depressed appearing.  CNS: CN 2-12 intact, power,  normal throughout.no focal deficits noted.   Assessment & Plan  Essential hypertension Controlled, no change in medication DASH diet and commitment to daily physical activity for a minimum of 30 minutes  discussed and encouraged, as a part of hypertension management. The importance of attaining a healthy weight is also discussed.  BP/Weight 06/02/2016 04/24/2016 04/10/2016 04/06/2016 03/27/2016 03/26/2016 Q000111Q  Systolic BP Q000111Q 0000000 [id number] [id number] - 0000000  Diastolic BP 80 68 70 80 54 - 78  Wt. (Lbs) 163 - 159 159 - 158 158  BMI 22.73 - 22.19 22.19 - 22.05 22.67       Hyperlipemia Hyperlipidemia:Low fat diet discussed and encouraged.   Lipid Panel  Lab Results  Component Value Date   CHOL 195 06/02/2016   HDL 94 06/02/2016   LDLCALC 91 06/02/2016   TRIG 50 06/02/2016   CHOLHDL 2.1 06/02/2016    Controlled, no change in medication    ANXIETY DISORDER, GENERALIZED Controlled, no change in medication   Allergic rhinitis Controlled, no change in medication   GERD Controlled, no change in medication Followed  By GI also  Need for prophylactic vaccination and inoculation against influenza After obtaining informed consent, the vaccine is  administered by LPN.

## 2016-06-08 NOTE — Assessment & Plan Note (Signed)
Controlled, no change in medication DASH diet and commitment to daily physical activity for a minimum of 30 minutes discussed and encouraged, as a part of hypertension management. The importance of attaining a healthy weight is also discussed.  BP/Weight 06/02/2016 04/24/2016 04/10/2016 04/06/2016 03/27/2016 03/26/2016 Q000111Q  Systolic BP Q000111Q 0000000 [id number] [id number] 123456 - 0000000  Diastolic BP 80 68 70 80 54 - 78  Wt. (Lbs) 163 - 159 159 - 158 158  BMI 22.73 - 22.19 22.19 - 22.05 22.67

## 2016-06-08 NOTE — Assessment & Plan Note (Signed)
After obtaining informed consent, the vaccine is  administered by LPN.  

## 2016-06-08 NOTE — Assessment & Plan Note (Signed)
Hyperlipidemia:Low fat diet discussed and encouraged.   Lipid Panel  Lab Results  Component Value Date   CHOL 195 06/02/2016   HDL 94 06/02/2016   LDLCALC 91 06/02/2016   TRIG 50 06/02/2016   CHOLHDL 2.1 06/02/2016    Controlled, no change in medication

## 2016-06-13 ENCOUNTER — Other Ambulatory Visit: Payer: Self-pay | Admitting: Family Medicine

## 2016-07-21 ENCOUNTER — Other Ambulatory Visit: Payer: Self-pay | Admitting: Family Medicine

## 2016-07-29 ENCOUNTER — Other Ambulatory Visit: Payer: Self-pay | Admitting: Family Medicine

## 2016-09-16 ENCOUNTER — Other Ambulatory Visit: Payer: Self-pay | Admitting: Family Medicine

## 2016-09-30 ENCOUNTER — Encounter: Payer: Self-pay | Admitting: Gastroenterology

## 2016-10-02 ENCOUNTER — Encounter: Payer: Medicare Other | Admitting: Family Medicine

## 2016-10-02 ENCOUNTER — Encounter: Payer: Self-pay | Admitting: Family Medicine

## 2016-10-26 ENCOUNTER — Other Ambulatory Visit: Payer: Self-pay | Admitting: Family Medicine

## 2016-11-05 ENCOUNTER — Encounter: Payer: Self-pay | Admitting: Family Medicine

## 2016-11-05 ENCOUNTER — Encounter: Payer: Medicare Other | Admitting: Internal Medicine

## 2016-11-05 ENCOUNTER — Ambulatory Visit (INDEPENDENT_AMBULATORY_CARE_PROVIDER_SITE_OTHER): Payer: Medicare Other | Admitting: Family Medicine

## 2016-11-05 VITALS — BP 130/80 | HR 70 | Temp 98.2°F | Resp 18 | Ht 71.0 in | Wt 163.0 lb

## 2016-11-05 DIAGNOSIS — J069 Acute upper respiratory infection, unspecified: Secondary | ICD-10-CM

## 2016-11-05 DIAGNOSIS — B9789 Other viral agents as the cause of diseases classified elsewhere: Secondary | ICD-10-CM | POA: Diagnosis not present

## 2016-11-05 NOTE — Patient Instructions (Signed)
Acetaminophen ( tylenol) for fever or aches Drink plenty of water Use a saline nasal spray if needed for congestion Call if not better by next week

## 2016-11-05 NOTE — Progress Notes (Signed)
Subjective:     Candace Cervantes is a 83 y.o. female who presents for evaluation of symptoms of a URI. Symptoms include achiness, nasal congestion, non productive cough and "sniffly nose". Onset of symptoms was 2 days ago, and has been stable since that time. Treatment to date: none. Worried about the "bad flu" going around and wants reassurance this is not influenza.  No sputum, chest congestion or difficulty breathing.  NO FEVER.  No treatment to date. Otherwise is well.  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  No sick contacts.  The following portions of the patient's history were reviewed and updated as appropriate: past family history, past medical history, past social history and past surgical history.  Review of Systems Pertinent items noted in HPI and remainder of comprehensive ROS otherwise negative.   Objective:    BP 130/80 (BP Location: Right Arm, Patient Position: Sitting, Cuff Size: Normal)   Pulse 70   Temp 98.2 F (36.8 C) (Oral)   Resp 18   Ht 5\' 11"  (1.803 m)   Wt 163 lb (73.9 kg)   SpO2 98%   BMI 22.73 kg/m  General appearance: alert, appears stated age, fatigued and mild distress Head: Normocephalic, without obvious abnormality, atraumatic Eyes: negative findings: lids and lashes normal and conjunctivae and sclerae normal Ears: normal TM's and external ear canals both ears Nose: clear discharge, mild congestion Throat: normal findings: buccal mucosa normal, gums healthy and soft palate, uvula, and tonsils normal Neck: no adenopathy, supple, symmetrical, trachea midline and thyroid not enlarged, symmetric, no tenderness/mass/nodules Lungs: clear to auscultation bilaterally, bibasilar crackles Heart: regular rate and rhythm, S1, S2 normal, no murmur, click, rub or gallop Abdomen: normal findings: soft, non-tender Extremities: extremities normal, atraumatic, no cyanosis or edema   Assessment:    viral upper respiratory illness   Plan:    Discussed diagnosis and  treatment of URI. Suggested symptomatic OTC remedies. Nasal saline spray for congestion. Follow up as needed.

## 2016-11-16 ENCOUNTER — Other Ambulatory Visit: Payer: Self-pay | Admitting: Family Medicine

## 2016-11-22 ENCOUNTER — Other Ambulatory Visit: Payer: Self-pay | Admitting: Family Medicine

## 2016-12-17 ENCOUNTER — Encounter: Payer: Self-pay | Admitting: Internal Medicine

## 2016-12-17 ENCOUNTER — Ambulatory Visit (INDEPENDENT_AMBULATORY_CARE_PROVIDER_SITE_OTHER): Payer: Medicare Other | Admitting: Internal Medicine

## 2016-12-17 VITALS — BP 124/68 | HR 66 | Ht 68.0 in | Wt 162.0 lb

## 2016-12-17 DIAGNOSIS — Z950 Presence of cardiac pacemaker: Secondary | ICD-10-CM | POA: Diagnosis not present

## 2016-12-17 DIAGNOSIS — I495 Sick sinus syndrome: Secondary | ICD-10-CM | POA: Diagnosis not present

## 2016-12-17 DIAGNOSIS — I480 Paroxysmal atrial fibrillation: Secondary | ICD-10-CM | POA: Diagnosis not present

## 2016-12-17 LAB — CUP PACEART INCLINIC DEVICE CHECK
Battery Impedance: 2800 Ohm
Brady Statistic RA Percent Paced: 56 %
Date Time Interrogation Session: 20180315115524
Implantable Lead Implant Date: 20021126
Lead Channel Pacing Threshold Amplitude: 0.5 V
Lead Channel Sensing Intrinsic Amplitude: 1.4 mV
Lead Channel Sensing Intrinsic Amplitude: 7.9 mV
Lead Channel Setting Pacing Pulse Width: 0.5 ms
Lead Channel Setting Sensing Sensitivity: 2 mV
MDC IDC LEAD IMPLANT DT: 20021126
MDC IDC LEAD LOCATION: 753859
MDC IDC LEAD LOCATION: 753860
MDC IDC MSMT BATTERY VOLTAGE: 2.78 V
MDC IDC MSMT LEADCHNL RA IMPEDANCE VALUE: 426 Ohm
MDC IDC MSMT LEADCHNL RA PACING THRESHOLD PULSEWIDTH: 0.5 ms
MDC IDC MSMT LEADCHNL RV IMPEDANCE VALUE: 483 Ohm
MDC IDC MSMT LEADCHNL RV PACING THRESHOLD AMPLITUDE: 0.75 V
MDC IDC MSMT LEADCHNL RV PACING THRESHOLD PULSEWIDTH: 0.5 ms
MDC IDC PG IMPLANT DT: 20070926
MDC IDC SET LEADCHNL RA PACING AMPLITUDE: 2 V
MDC IDC SET LEADCHNL RV PACING AMPLITUDE: 2.5 V
MDC IDC STAT BRADY RV PERCENT PACED: 2 %
Pulse Gen Serial Number: 1794510

## 2016-12-17 NOTE — Patient Instructions (Signed)
Your physician recommends that you schedule a follow-up appointment in: 6 Months with the [place] physician wants you to follow-up in: 1 Year with Dr. Lovena Le. You will receive a reminder letter in the mail two months in advance. If you don't receive a letter, please call our office to schedule the follow-up appointment.   Your physician recommends that you continue on your current medications as directed. Please refer to the Current Medication list given to you today.  If you need a refill on your cardiac medications before your next appointment, please call your pharmacy.  Thank you for choosing [facility]!

## 2016-12-17 NOTE — Progress Notes (Signed)
HPI Candace Cervantes returns today for followup. She is a pleasant 83 yo woman with a h/o symptomatic bradycardia, s/p PPM, HTN, and CAD. In the interim, she has been stable. She admits to an occaisional fall though she has not injured herself. No syncope or chest pain. No sob. She has lost some weight but thinks that she gets full easier. Her husband of 22 years has died after a long illness.  No Known Allergies   Current Outpatient Prescriptions  Medication Sig Dispense Refill  . busPIRone (BUSPAR) 5 MG tablet TAKE 1 TABLET(5 MG) BY MOUTH TWICE DAILY 180 tablet 1  . citalopram (CELEXA) 10 MG tablet Take 1 tablet (10 mg total) by mouth daily. 90 tablet 1  . fluticasone (FLONASE) 50 MCG/ACT nasal spray Place 2 sprays into both nostrils daily. 16 g 2  . meclizine (ANTIVERT) 25 MG tablet TAKE 1 TABLET(25 MG) BY MOUTH TWICE DAILY AS NEEDED 60 tablet 3  . Multiple Vitamin (MULTIVITAMIN WITH MINERALS) TABS tablet Take 1 tablet by mouth daily.    . nitroGLYCERIN (NITROSTAT) 0.4 MG SL tablet Place 1 tablet (0.4 mg total) under the tongue every 5 (five) minutes as needed for chest pain. One tablet under the tongue at onset of chest pains, repeat every five minutes as needed 25 tablet 1  . omeprazole (PRILOSEC) 20 MG capsule TAKE 1 CAPSULE BY MOUTH DAILY 90 capsule 0  . potassium chloride SA (K-DUR,KLOR-CON) 20 MEQ tablet TAKE 1 TABLET BY MOUTH EVERY DAY 90 tablet 1  . pravastatin (PRAVACHOL) 40 MG tablet TAKE 1 TABLET BY MOUTH EVERY DAY 30 tablet 1  . sodium chloride (OCEAN) 0.65 % SOLN nasal spray Place 2 sprays into both nostrils as needed for congestion. 104 mL 1  . triamterene-hydrochlorothiazide (MAXZIDE-25) 37.5-25 MG tablet Take 1 tablet by mouth daily. 30 tablet 11  . triamterene-hydrochlorothiazide (MAXZIDE-25) 37.5-25 MG tablet Take 1 tablet by mouth daily. 90 tablet 1   No current facility-administered medications for this visit.      Past Medical History:  Diagnosis Date  . Anxiety disorder    . CAD (coronary artery disease) 2005   Nonobstructive 40% RCA lesion and normal ejection fraction at cath   . Gastroesophageal reflux disease   . Hyperlipidemia   . Hypertension   . Osteoarthritis   . Osteoporosis   . Pacemaker 2002   Permanent Placement  . Pacemaker 2007   Generator Changed   . Sinoatrial node dysfunction (HCC)   . SSS (sick sinus syndrome) ([place])   . Zenker diverticulum    previously evaluated by Dr. Erik Obey in 3785.     ROS:   All systems reviewed and negative except as noted in the HPI.   Past Surgical History:  Procedure Laterality Date  . CHOLECYSTECTOMY    . COLONOSCOPY  2003   no polyps.  . CYSTECTOMY     from back of the neck  . ESOPHAGOGASTRODUODENOSCOPY  2008   Dr. Oneida Alar, difficulty passing scope through UES, incomplete fibrous ring at GEJ, hh, multiple benign gastri polyps  . ESOPHAGOGASTRODUODENOSCOPY N/A 10/17/2013   ZENKER'S, STRICTURE: 10-12.8 MM,  NSAID GASTRITIS FG POLYPS  . ESOPHAGOGASTRODUODENOSCOPY (EGD) WITH ESOPHAGEAL DILATION N/A 10/30/2013   SLF: 1. Zenkers diverticulum with a large opening at the cricopharyngeus 2. 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 Innumerable Fundic gland polyps 4. Mild NSAID gastritis  . MALONEY DILATION N/A 10/17/2013   Procedure: MALONEY DILATION;  Surgeon: Danie Binder, MD;  Location: [facility];  Service: Endoscopy;  Laterality: N/A;  with PEDS GASTROSCOPE  . PACEMAKER INSERTION  2002  . PACEMAKER PLACEMENT  2007   replacement   . SAVORY DILATION N/A 10/17/2013   Procedure: SAVORY DILATION;  Surgeon: Danie Binder, MD;  Location: [facility];  Service: Endoscopy;  Laterality: N/A;  with PEDS GASTROSCOPE  . TOTAL ABDOMINAL HYSTERECTOMY  2002     Family History  Problem Relation Age of Onset  . Heart disease Mother   . Lung disease Sister   . Cancer Brother   . Alcohol abuse Brother   . Colon cancer Other     sibling, age 46     Social History   Social History  . Marital status: Married     Spouse name: N/A  . Number of children: 2  . Years of education: N/A   Occupational History  . Not on file.   Social History Main Topics  . Smoking status: Never Smoker  . Smokeless tobacco: Never Used  . Alcohol use No  . Drug use: No  . Sexual activity: Not Currently   Other Topics Concern  . Not on file   Social History Narrative  . No narrative on file     BP 124/68   Pulse 66   Ht 5\' 8"  (1.727 m)   Wt 162 lb (73.5 kg)   SpO2 99%   BMI 24.63 kg/m   Physical Exam:  Well appearing 83 yo woman, NAD HEENT: Unremarkable Neck:  7 cm JVD, no thyromegally Back:  No CVA tenderness Lungs:  Clear with no wheezes, rales, or rhonchi HEART:  Regular rate rhythm, no murmurs, no rubs, no clicks Abd:  soft, positive bowel sounds, no organomegally, no rebound, no guarding Ext:  2 plus pulses, no edema, no cyanosis, no clubbing Skin:  No rashes no nodules Neuro:  CN II through XII intact, motor grossly intact  DEVICE  Normal device function.  See PaceArt for details.   Assess/Plan: 1. Sinus node dysfunction - she is asymptomatic, s/p PPM insertion 2. PAF - she has had upto 43 minutes of atrial fib. She is falling and a poor candidate for anti-coagulation. 3. HTN - her blood pressure is well controlled. Will follow. 4. PPM - her [employer]. Jude device is working normally. Will follow. Candace Cervantes.D.

## 2017-01-08 ENCOUNTER — Other Ambulatory Visit: Payer: Self-pay | Admitting: Family Medicine

## 2017-01-13 ENCOUNTER — Ambulatory Visit: Payer: Medicare Other

## 2017-01-20 ENCOUNTER — Ambulatory Visit: Payer: Medicare Other

## 2017-01-28 ENCOUNTER — Ambulatory Visit: Payer: Medicare Other

## 2017-02-02 ENCOUNTER — Ambulatory Visit (INDEPENDENT_AMBULATORY_CARE_PROVIDER_SITE_OTHER): Payer: Medicare Other | Admitting: Family Medicine

## 2017-02-02 ENCOUNTER — Encounter: Payer: Self-pay | Admitting: Family Medicine

## 2017-02-02 ENCOUNTER — Other Ambulatory Visit: Payer: Self-pay | Admitting: Family Medicine

## 2017-02-02 VITALS — BP 150/78 | HR 73 | Resp 16 | Ht 68.0 in | Wt 167.0 lb

## 2017-02-02 DIAGNOSIS — Z1211 Encounter for screening for malignant neoplasm of colon: Secondary | ICD-10-CM | POA: Diagnosis not present

## 2017-02-02 DIAGNOSIS — Z0000 Encounter for general adult medical examination without abnormal findings: Secondary | ICD-10-CM | POA: Diagnosis not present

## 2017-02-02 DIAGNOSIS — E785 Hyperlipidemia, unspecified: Secondary | ICD-10-CM

## 2017-02-02 DIAGNOSIS — R7301 Impaired fasting glucose: Secondary | ICD-10-CM

## 2017-02-02 DIAGNOSIS — I10 Essential (primary) hypertension: Secondary | ICD-10-CM

## 2017-02-02 NOTE — Patient Instructions (Addendum)
f/u in early October, call if you need me sooner  Pls keep wellness visit with Albina Billet  Please check [facility] for necklace  For fall alert , this is an excellent thing to wear since you live alone  I am thankful that you are managing well, I know that you miss your husband, and I know that you cared for him the best that you could  Fasting lipid, cmp , hBa1C, CBC and tSH 1 week before next visit please  Thank you  for choosing [facility]. We consider it a privelige to serve you.  Delivering excellent health care in a caring and  compassionate way is our goal.  Partnering with you,  so that together we can achieve this goal is our strategy.    Fall Prevention in the Home Falls can cause injuries. They can happen to people of all ages. There are many things you can do to make your home safe and to help prevent falls. What can I do on the outside of my home?  Regularly fix the edges of walkways and driveways and fix any cracks.  Remove anything that might make you trip as you walk through a door, such as a raised step or threshold.  Trim any bushes or trees on the path to your home.  Use bright outdoor lighting.  Clear any walking paths of anything that might make someone trip, such as rocks or tools.  Regularly check to see if handrails are loose or broken. Make sure that both sides of any steps have handrails.  Any raised decks and porches should have guardrails on the edges.  Have any leaves, snow, or ice cleared regularly.  Use sand or salt on walking paths during winter.  Clean up any spills in your garage right away. This includes oil or grease spills. What can I do in the bathroom?  Use night lights.  Install grab bars by the toilet and in the tub and shower. Do not use towel bars as grab bars.  Use non-skid mats or decals in the tub or shower.  If you need to sit down in the shower, use a plastic, non-slip stool.  Keep the floor dry.  Clean up any water that spills on the floor as soon as it happens.  Remove soap buildup in the tub or shower regularly.  Attach bath mats securely with double-sided non-slip rug tape.  Do not have throw rugs and other things on the floor that can make you trip. What can I do in the bedroom?  Use night lights.  Make sure that you have a light by your bed that is easy to reach.  Do not use any sheets or blankets that are too big for your bed. They should not hang down onto the floor.  Have a firm chair that has side arms. You can use this for support while you get dressed.  Do not have throw rugs and other things on the floor that can make you trip. What can I do in the kitchen?  Clean up any spills right away.  Avoid walking on wet floors.  Keep items that you use a lot in easy-to-reach places.  If you need to reach something above you, use a strong step stool that has a grab bar.  Keep electrical cords out of the way.  Do not use floor polish or wax that makes floors slippery. If you must use wax, use non-skid floor wax.  Do not have throw  rugs and other things on the floor that can make you trip. What can I do with my stairs?  Do not leave any items on the stairs.  Make sure that there are handrails on both sides of the stairs and use them. Fix handrails that are broken or loose. Make sure that handrails are as long as the stairways.  Check any carpeting to make sure that it is firmly attached to the stairs. Fix any carpet that is loose or worn.  Avoid having throw rugs at the top or bottom of the stairs. If you do have throw rugs, attach them to the floor with carpet tape.  Make sure that you have a light switch at the top of the stairs and the bottom of the stairs. If you do not have them, ask someone to add them for you. What else can I do to help prevent falls?  Wear shoes that:  Do not have high heels.  Have rubber bottoms.  Are comfortable and fit you  well.  Are closed at the toe. Do not wear sandals.  If you use a stepladder:  Make sure that it is fully opened. Do not climb a closed stepladder.  Make sure that both sides of the stepladder are locked into place.  Ask someone to hold it for you, if possible.  Clearly mark and make sure that you can see:  Any grab bars or handrails.  First and last steps.  Where the edge of each step is.  Use tools that help you move around (mobility aids) if they are needed. These include:  Canes.  Walkers.  Scooters.  Crutches.  Turn on the lights when you go into a dark area. Replace any light bulbs as soon as they burn out.  Set up your furniture so you have a clear path. Avoid moving your furniture around.  If any of your floors are uneven, fix them.  If there are any pets around you, be aware of where they are.  Review your medicines with your doctor. Some medicines can make you feel dizzy. This can increase your chance of falling. Ask your doctor what other things that you can do to help prevent falls. This information is not intended to replace advice given to you by your health care provider. Make sure you discuss any questions you have with your health care provider. Document Released: 07/18/2009 Document Revised: 02/27/2016 Document Reviewed: 10/26/2014 Elsevier Interactive Patient Education  2017 [employer].

## 2017-02-03 LAB — POC HEMOCCULT BLD/STL (OFFICE/1-CARD/DIAGNOSTIC): Fecal Occult Blood, POC: NEGATIVE

## 2017-02-03 NOTE — Assessment & Plan Note (Signed)
No rectal mass and heme negative stool 

## 2017-02-03 NOTE — Progress Notes (Signed)
    Candace Cervantes     MRN: [id number]      DOB: Jul 14, 1925  HPI: Patient is in for annual physical exam. Has red rash on right arm x 2 days and also has had this in back of scalp, used alcohol with relief, no insect bites. Immunization is reviewed , and  updated if needed.   PE: Pleasant  female, alert and oriented x 3, in no cardio-pulmonary distress. Afebrile. HEENT No facial trauma or asymetry. Sinuses non tender.  Extra occullar muscles intact, pupils equally reactive to light. External ears normal, tympanic membranes clear. Oropharynx moist, no exudate. Neck: decreased ROM, no adenopathy,JVD or thyromegaly.No bruits.  Chest: Clear to ascultation bilaterally.No crackles or wheezes. Non tender to palpation Pacemaker palpated under left chest  Breast: No asymetry,no masses or lumps. No tenderness. No nipple discharge or inversion. No axillary or supraclavicular adenopathy  Cardiovascular system; Heart sounds normal,  S1 and  S2 ,no S3.  No murmur, or thrill. Apical beat not displaced Peripheral pulses normal.  Abdomen: Soft, non tender, no organomegaly or masses. No bruits. Bowel sounds normal. No guarding, tenderness or rebound.  Rectal:  Normal sphincter tone. No rectal mass. Guaiac negative stool.  GU:  Not  Examined, asymptomatic   Musculoskeletal exam: Decreased though adequate ROM of spine, hips , shoulders and knees. No deformity ,swelling or crepitus noted. No muscle wasting or atrophy.   Neurologic: Cranial nerves 2 to 12 intact. Power, tone ,sensation and reflexes normal throughout. No  tremor.  Skin: Intact, no ulceration, erythema , scaling or rash noted. Pigmentation normal throughout  Psych; Normal mood and affect. Judgement and concentration normal   Assessment & Plan:  Annual physical exam Annual exam as documented.  Immunization and cancer screening needs are specifically addressed at this visit.   Colon cancer screening No  rectal mass and heme negative stool

## 2017-02-03 NOTE — Assessment & Plan Note (Signed)
Annual exam as documented. . Immunization and cancer screening needs are specifically addressed at this visit.  

## 2017-02-03 NOTE — Addendum Note (Signed)
Addended by: Eual Fines on: 02/03/2017 08:28 AM   Modules accepted: Orders

## 2017-02-15 ENCOUNTER — Ambulatory Visit: Payer: Medicare Other

## 2017-02-20 ENCOUNTER — Observation Stay (HOSPITAL_COMMUNITY): Payer: Medicare Other

## 2017-02-20 ENCOUNTER — Encounter (HOSPITAL_COMMUNITY): Payer: Self-pay | Admitting: *Deleted

## 2017-02-20 ENCOUNTER — Emergency Department (HOSPITAL_COMMUNITY): Payer: Medicare Other

## 2017-02-20 ENCOUNTER — Observation Stay (HOSPITAL_BASED_OUTPATIENT_CLINIC_OR_DEPARTMENT_OTHER): Payer: Medicare Other

## 2017-02-20 ENCOUNTER — Observation Stay (HOSPITAL_COMMUNITY)
Admission: EM | Admit: 2017-02-20 | Discharge: 2017-02-20 | Disposition: A | Discharge status: Home / Self Care | Payer: Medicare Other | Attending: Internal Medicine | Admitting: Internal Medicine

## 2017-02-20 DIAGNOSIS — F411 Generalized anxiety disorder: Secondary | ICD-10-CM | POA: Diagnosis present

## 2017-02-20 DIAGNOSIS — I340 Nonrheumatic mitral (valve) insufficiency: Secondary | ICD-10-CM | POA: Diagnosis not present

## 2017-02-20 DIAGNOSIS — R072 Precordial pain: Secondary | ICD-10-CM

## 2017-02-20 DIAGNOSIS — R079 Chest pain, unspecified: Secondary | ICD-10-CM | POA: Diagnosis present

## 2017-02-20 DIAGNOSIS — Z79899 Other long term (current) drug therapy: Secondary | ICD-10-CM | POA: Diagnosis not present

## 2017-02-20 DIAGNOSIS — K219 Gastro-esophageal reflux disease without esophagitis: Secondary | ICD-10-CM | POA: Diagnosis present

## 2017-02-20 DIAGNOSIS — E860 Dehydration: Secondary | ICD-10-CM | POA: Diagnosis not present

## 2017-02-20 DIAGNOSIS — R51 Headache: Secondary | ICD-10-CM

## 2017-02-20 DIAGNOSIS — E876 Hypokalemia: Secondary | ICD-10-CM | POA: Diagnosis present

## 2017-02-20 DIAGNOSIS — E785 Hyperlipidemia, unspecified: Secondary | ICD-10-CM | POA: Diagnosis present

## 2017-02-20 DIAGNOSIS — Z950 Presence of cardiac pacemaker: Secondary | ICD-10-CM | POA: Diagnosis not present

## 2017-02-20 DIAGNOSIS — M5030 Other cervical disc degeneration, unspecified cervical region: Secondary | ICD-10-CM | POA: Diagnosis not present

## 2017-02-20 DIAGNOSIS — F418 Other specified anxiety disorders: Secondary | ICD-10-CM

## 2017-02-20 DIAGNOSIS — I200 Unstable angina: Principal | ICD-10-CM | POA: Insufficient documentation

## 2017-02-20 DIAGNOSIS — R519 Headache, unspecified: Secondary | ICD-10-CM | POA: Diagnosis present

## 2017-02-20 DIAGNOSIS — I10 Essential (primary) hypertension: Secondary | ICD-10-CM | POA: Diagnosis present

## 2017-02-20 DIAGNOSIS — D638 Anemia in other chronic diseases classified elsewhere: Secondary | ICD-10-CM | POA: Diagnosis present

## 2017-02-20 LAB — CBC
HEMATOCRIT: 35.3 % — AB (ref 36.0–46.0)
HEMOGLOBIN: 11.7 g/dL — AB (ref 12.0–15.0)
MCH: 31 pg (ref 26.0–34.0)
MCHC: 33.1 g/dL (ref 30.0–36.0)
MCV: 93.4 fL (ref 78.0–100.0)
Platelets: 154 10*3/uL (ref 150–400)
RBC: 3.78 MIL/uL — AB (ref 3.87–5.11)
RDW: 13.7 % (ref 11.5–15.5)
WBC: 4.6 10*3/uL (ref 4.0–10.5)

## 2017-02-20 LAB — ECHOCARDIOGRAM COMPLETE
CHL CUP DOP CALC LVOT VTI: 25.4 cm
CHL CUP RV SYS PRESS: 29 mmHg
CHL CUP TV REG PEAK VELOCITY: 255 cm/s
E decel time: 310 msec
E/e' ratio: 9.32
FS: 40 % (ref 28–44)
Height: 71 in
IVS/LV PW RATIO, ED: 1.06
LA ID, A-P, ES: 28 mm
LA diam end sys: 28 mm
LA diam index: 1.44 cm/m2
LA vol A4C: 27.6 ml
LA vol: 31 mL
LAVOLIN: 16 mL/m2
LDCA: 2.54 cm2
LV E/e' medial: 9.32
LV E/e'average: 9.32
LV PW d: 12.1 mm — AB (ref 0.6–1.1)
LV TDI E'LATERAL: 7.62
LV dias vol index: 24 mL/m2
LV dias vol: 47 mL (ref 46–106)
LV sys vol index: 7 mL/m2
LVELAT: 7.62 cm/s
LVOT diameter: 18 mm
LVOT peak grad rest: 4 mmHg
LVOTPV: 102 cm/s
LVOTSV: 65 mL
LVSYSVOL: 13 mL — AB
MV Dec: 310
MV pk A vel: 87.1 m/s
MV pk E vel: 71 m/s
MVPG: 2 mmHg
RV LATERAL S' VELOCITY: 12.7 cm/s
RV TAPSE: 24.8 mm
Simpson's disk: 72
Stroke v: 34 ml
TDI e' medial: 6.85
TR max vel: 255 cm/s
Weight: 2631.41 oz

## 2017-02-20 LAB — LIPASE, BLOOD: LIPASE: 21 U/L (ref 11–51)

## 2017-02-20 LAB — BASIC METABOLIC PANEL
ANION GAP: 7 (ref 5–15)
BUN: 22 mg/dL — AB (ref 6–20)
CHLORIDE: 104 mmol/L (ref 101–111)
CO2: 29 mmol/L (ref 22–32)
Calcium: 9.4 mg/dL (ref 8.9–10.3)
Creatinine, Ser: 1.13 mg/dL — ABNORMAL HIGH (ref 0.44–1.00)
GFR calc Af Amer: 48 mL/min — ABNORMAL LOW (ref >60)
GFR calc non Af Amer: 42 mL/min — ABNORMAL LOW (ref >60)
Glucose, Bld: 121 mg/dL — ABNORMAL HIGH (ref 65–99)
POTASSIUM: 3.2 mmol/L — AB (ref 3.5–5.1)
SODIUM: 140 mmol/L (ref 135–145)

## 2017-02-20 LAB — HEPATIC FUNCTION PANEL
ALBUMIN: 3.9 g/dL (ref 3.5–5.0)
ALT: 22 U/L (ref 14–54)
AST: 28 U/L (ref 15–41)
Alkaline Phosphatase: 54 U/L (ref 38–126)
TOTAL PROTEIN: 7 g/dL (ref 6.5–8.1)
Total Bilirubin: 0.4 mg/dL (ref 0.3–1.2)

## 2017-02-20 LAB — I-STAT TROPONIN, ED: Troponin i, poc: 0.01 ng/mL (ref 0.00–0.08)

## 2017-02-20 LAB — TROPONIN I

## 2017-02-20 LAB — MAGNESIUM: MAGNESIUM: 2 mg/dL (ref 1.7–2.4)

## 2017-02-20 MED ORDER — NITROGLYCERIN 0.4 MG SL SUBL: 0.4000 mg | SUBLINGUAL_TABLET | SUBLINGUAL | Status: DC | PRN

## 2017-02-20 MED ORDER — CITALOPRAM HYDROBROMIDE 20 MG PO TABS
10.0000 mg | ORAL_TABLET | Freq: Every day | ORAL | Status: DC
  Administered 2017-02-20: 10 mg via ORAL
  Filled 2017-02-20: qty 1

## 2017-02-20 MED ORDER — POTASSIUM CHLORIDE CRYS ER 20 MEQ PO TBCR
40.0000 meq | EXTENDED_RELEASE_TABLET | Freq: Every day | ORAL | Status: DC
  Administered 2017-02-20: 40 meq via ORAL
  Filled 2017-02-20: qty 2

## 2017-02-20 MED ORDER — TRIAMTERENE-HCTZ 37.5-25 MG PO TABS
1.0000 | ORAL_TABLET | Freq: Every day | ORAL | Status: DC
  Administered 2017-02-20: 1 via ORAL
  Filled 2017-02-20: qty 1

## 2017-02-20 MED ORDER — PRAVASTATIN SODIUM 40 MG PO TABS: 40.0000 mg | ORAL_TABLET | Freq: Every day | ORAL | 1 refills | Status: DC

## 2017-02-20 MED ORDER — PRAVASTATIN SODIUM 40 MG PO TABS
40.0000 mg | ORAL_TABLET | Freq: Every day | ORAL | Status: DC
  Administered 2017-02-20: 40 mg via ORAL
  Filled 2017-02-20: qty 1

## 2017-02-20 MED ORDER — METHOCARBAMOL 500 MG PO TABS: 500.0000 mg | ORAL_TABLET | Freq: Four times a day (QID) | ORAL | 0 refills | Status: DC | PRN

## 2017-02-20 MED ORDER — POTASSIUM CHLORIDE CRYS ER 20 MEQ PO TBCR
40.0000 meq | EXTENDED_RELEASE_TABLET | Freq: Once | ORAL | Status: AC
  Administered 2017-02-20: 40 meq via ORAL
  Filled 2017-02-20: qty 2

## 2017-02-20 MED ORDER — ADULT MULTIVITAMIN W/MINERALS CH
1.0000 | ORAL_TABLET | Freq: Every day | ORAL | Status: DC
  Administered 2017-02-20: 1 via ORAL
  Filled 2017-02-20: qty 1

## 2017-02-20 MED ORDER — KETOROLAC TROMETHAMINE 15 MG/ML IJ SOLN
15.0000 mg | Freq: Once | INTRAMUSCULAR | Status: AC
  Administered 2017-02-20: 15 mg via INTRAVENOUS
  Filled 2017-02-20 (×2): qty 1

## 2017-02-20 MED ORDER — ONDANSETRON HCL 4 MG/2ML IJ SOLN: 4.0000 mg | Freq: Four times a day (QID) | INTRAMUSCULAR | Status: DC | PRN

## 2017-02-20 MED ORDER — MECLIZINE HCL 12.5 MG PO TABS: 25.0000 mg | ORAL_TABLET | Freq: Two times a day (BID) | ORAL | Status: DC | PRN

## 2017-02-20 MED ORDER — METHOCARBAMOL 500 MG PO TABS
500.0000 mg | ORAL_TABLET | Freq: Three times a day (TID) | ORAL | Status: DC
  Administered 2017-02-20: 500 mg via ORAL
  Filled 2017-02-20: qty 1

## 2017-02-20 MED ORDER — FLUTICASONE PROPIONATE 50 MCG/ACT NA SUSP
2.0000 | Freq: Every day | NASAL | Status: DC
  Filled 2017-02-20: qty 16

## 2017-02-20 MED ORDER — SALINE SPRAY 0.65 % NA SOLN: 2.0000 | NASAL | Status: DC | PRN

## 2017-02-20 MED ORDER — ACETAMINOPHEN 325 MG PO TABS: 650.0000 mg | ORAL_TABLET | Freq: Four times a day (QID) | ORAL | Status: DC | PRN

## 2017-02-20 MED ORDER — BUSPIRONE HCL 5 MG PO TABS
5.0000 mg | ORAL_TABLET | Freq: Two times a day (BID) | ORAL | Status: DC
  Administered 2017-02-20: 5 mg via ORAL
  Filled 2017-02-20: qty 1

## 2017-02-20 MED ORDER — DIPHENHYDRAMINE-ZINC ACETATE 2-0.1 % EX CREA
TOPICAL_CREAM | Freq: Three times a day (TID) | CUTANEOUS | Status: DC | PRN
  Filled 2017-02-20: qty 28

## 2017-02-20 MED ORDER — PANTOPRAZOLE SODIUM 40 MG PO TBEC
40.0000 mg | DELAYED_RELEASE_TABLET | Freq: Every day | ORAL | Status: DC
  Administered 2017-02-20: 40 mg via ORAL
  Filled 2017-02-20: qty 1

## 2017-02-20 MED ORDER — ENOXAPARIN SODIUM 40 MG/0.4ML ~~LOC~~ SOLN
40.0000 mg | SUBCUTANEOUS | Status: DC
  Administered 2017-02-20: 40 mg via SUBCUTANEOUS
  Filled 2017-02-20: qty 0.4

## 2017-02-20 MED ORDER — POTASSIUM CHLORIDE CRYS ER 20 MEQ PO TBCR: 40.0000 meq | EXTENDED_RELEASE_TABLET | Freq: Every day | ORAL | 0 refills | Status: DC

## 2017-02-20 NOTE — Progress Notes (Signed)
*  PRELIMINARY RESULTS* Echocardiogram 2D Echocardiogram has been performed.  Candace Cervantes 02/20/2017, 12:16 PM

## 2017-02-20 NOTE — ED Triage Notes (Signed)
Pt brought in by [facility] for c/o chest pain; pt took 2 nitro at home and was given 1 nitro en route by ems; pt is now pain free; cbg is 158; pt states the pain woke her up and starts in her mid chest and radiates up to her left neck and jaw

## 2017-02-20 NOTE — Progress Notes (Signed)
Patient discharged home.  IV removed - WNL.  Reviewed DC instructions and medications.  Verbalizes understanding.  No questions at this time.  Patient in NAD, assisted off unit via [place] by NT.

## 2017-02-20 NOTE — H&P (Signed)
History and Physical    Candace Cervantes KTG:[id number] DOB: July 28, 1925 DOA: 02/20/2017  PCP: Fayrene Helper, MD  Cardiologist: Dr. Cristopher Peru Patient coming from: Home.  I have personally briefly reviewed patient's old medical records in [facility]  Chief Complaint: Chest pain.  HPI: Candace Cervantes is a 83 y.o. female with medical history significant of anxiety, depression, GERD, Zenker diverticulum, hyperlipidemia, hypertension, osteoarthritis, osteoporosis, sick sinus syndrome, permanent pacemaker since 2002, CAD who is coming to the emergency department due to chest pain since about 0400.  Per patient, she was sleeping when she woke up with precordial chest pain, which she described rising from the stomach, radiates into the neck and to the back of her head. She describes the pain as a pressure, associated with mild dyspnea, nausea without emesis, diaphoresis with a sensation of a hot flash. The pain was relieved by 2 sublingual nitroglycerin that she had a home and one sublingual nitroglycerin by EMS. She was chest pain-free by the time she arrived to the emergency department. She also complains that she has been having occipital headache that radiates to her neck on and off for several months. She denies recent rhinorrhea, sore throat, productive cough, abdominal pain, melena or hematochezia but complains of frequent indigestion and constipation. She denies dysuria, hematuria or frequency. She complains of itching in her back for the past 2 days. She denies major sleep disturbance or decreased appetite.  ED Course: Workup in the emergency department showed an EKG with atrial paced rhythm, LAFB, possible LVH with anterior Q waves without significant changes from previous. Troponin level was normal. WBC 4.6, hemoglobin 11.7 grams per deciliter and platelets 154. Sodium 140, potassium 3.2, chloride 104 and bicarbonate 29 mmol/L. BUN 22 creatinine 1.13, magnesium 2.0 and glucose 121  mg/dL. Her LFTs were normal and lipase level was 21 units.   Imaging: Chest radiograph showed chronic basilar interstitial opacities without acute abnormalities.  Review of Systems: As per HPI otherwise 10 point review of systems negative.    Past Medical History:  Diagnosis Date  . Anxiety disorder   . CAD (coronary artery disease) 2005   Nonobstructive 40% RCA lesion and normal ejection fraction at cath   . Gastroesophageal reflux disease   . Hyperlipidemia   . Hypertension   . Osteoarthritis   . Osteoporosis   . Pacemaker 2002   Permanent Placement  . Pacemaker 2007   Generator Changed   . Sinoatrial node dysfunction (HCC)   . SSS (sick sinus syndrome) ([place])   . Zenker diverticulum    previously evaluated by Dr. Erik Obey in [id number].     Past Surgical History:  Procedure Laterality Date  . CHOLECYSTECTOMY    . COLONOSCOPY  2003   no polyps.  . CYSTECTOMY     from back of the neck  . ESOPHAGOGASTRODUODENOSCOPY  2008   Dr. Oneida Alar, difficulty passing scope through UES, incomplete fibrous ring at GEJ, hh, multiple benign gastri polyps  . ESOPHAGOGASTRODUODENOSCOPY N/A 10/17/2013   ZENKER'S, STRICTURE: 10-12.8 MM,  NSAID GASTRITIS FG POLYPS  . ESOPHAGOGASTRODUODENOSCOPY (EGD) WITH ESOPHAGEAL DILATION N/A 10/30/2013   SLF: 1. Zenkers diverticulum with a large opening at the cricopharyngeus 2. 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 Innumerable Fundic gland polyps 4. Mild NSAID gastritis  . MALONEY DILATION N/A 10/17/2013   Procedure: MALONEY DILATION;  Surgeon: Danie Binder, MD;  Location: [facility];  Service: Endoscopy;  Laterality: N/A;  with PEDS GASTROSCOPE  . PACEMAKER INSERTION  2002  .  PACEMAKER PLACEMENT  2007   replacement   . SAVORY DILATION N/A 10/17/2013   Procedure: SAVORY DILATION;  Surgeon: Danie Binder, MD;  Location: [facility];  Service: Endoscopy;  Laterality: N/A;  with PEDS GASTROSCOPE  . TOTAL ABDOMINAL HYSTERECTOMY  2002     reports that she has  never smoked. She has never used smokeless tobacco. She reports that she does not drink alcohol or use drugs.  No Known Allergies  Family History  Problem Relation Age of Onset  . Heart disease Mother   . Lung disease Sister   . Cancer Brother   . Alcohol abuse Brother   . Colon cancer Other        sibling, age 44    Prior to Admission medications   Medication Sig Start Date End Date Taking? Authorizing Provider  busPIRone (BUSPAR) 5 MG tablet TAKE 1 TABLET(5 MG) BY MOUTH TWICE DAILY 11/16/16   Fayrene Helper, MD  citalopram (CELEXA) 10 MG tablet TAKE 1 TABLET BY MOUTH DAILY 01/08/17   Fayrene Helper, MD  fluticasone (FLONASE) 50 MCG/ACT nasal spray Place 2 sprays into both nostrils daily. 08/07/15   Fayrene Helper, MD  meclizine (ANTIVERT) 25 MG tablet TAKE 1 TABLET(25 MG) BY MOUTH TWICE DAILY AS NEEDED 09/16/16   Fayrene Helper, MD  Multiple Vitamin (MULTIVITAMIN WITH MINERALS) TABS tablet Take 1 tablet by mouth daily.    [provider]  nitroGLYCERIN (NITROSTAT) 0.4 MG SL tablet Place 1 tablet (0.4 mg total) under the tongue every 5 (five) minutes as needed for chest pain. One tablet under the tongue at onset of chest pains, repeat every five minutes as needed 12/31/14   Fayrene Helper, MD  omeprazole (PRILOSEC) 20 MG capsule TAKE 1 CAPSULE BY MOUTH DAILY 02/02/17   Fayrene Helper, MD  potassium chloride SA (K-DUR,KLOR-CON) 20 MEQ tablet TAKE 1 TABLET BY MOUTH EVERY DAY 10/26/16   Fayrene Helper, MD  pravastatin (PRAVACHOL) 40 MG tablet TAKE 1 TABLET BY MOUTH EVERY DAY Patient not taking: Reported on 02/02/2017 05/30/15   Fayrene Helper, MD  sodium chloride (OCEAN) 0.65 % SOLN nasal spray Place 2 sprays into both nostrils as needed for congestion. 11/28/15   Fields, Marga Melnick, MD  triamterene-hydrochlorothiazide (MAXZIDE-25) 37.5-25 MG tablet Take 1 tablet by mouth daily. 04/10/16   Lendon Colonel, NP    Physical Exam: Vitals:   02/20/17 0453  02/20/17 0456 02/20/17 0500  BP:  132/64 119/66  Pulse:  60 61  Resp:  15 15  Temp:  97.5 F (36.4 C)   TempSrc:  Oral   SpO2:  96% 97%  Weight: 75.8 kg (167 lb)    Height: 5\' 8"  (1.727 m)      Constitutional: NAD, calm, comfortable Eyes: PERRL, lids and conjunctivae normal ENMT: Mucous membranes are moist. Posterior pharynx clear of any exudate or lesions.Dentures. Neck: Supple, no masses, no thyromegaly Respiratory: Bibasilar crackles, otherwise clear to auscultation bilaterally, no wheezing, no crackles. Normal respiratory effort. No accessory muscle use.  Cardiovascular: Bradycardic 58 BPM, no murmurs / rubs / gallops. No extremity edema. 2+ pedal pulses. No carotid bruits.  Abdomen: Soft, no tenderness, no masses palpated. No hepatosplenomegaly. Bowel sounds positive.  Musculoskeletal: no clubbing / cyanosis. Right index finger and left middle finger contractures with decreased ROM. Normal muscle tone.  Skin: Positive to a small areas of erythema on mid and lower back, which are pruritic per patient. Neurologic: CN 2-12 grossly intact.  Sensation intact, DTR normal. Strength 5/5 in all 4.  Psychiatric: Normal judgment and insight. Alert and oriented x 3. Normal mood.    Labs on Admission: I have personally reviewed following labs and imaging studies  CBC:  Recent Labs Lab 02/20/17 0510  WBC 4.6  HGB 11.7*  HCT 35.3*  MCV 93.4  PLT 161   Basic Metabolic Panel:  Recent Labs Lab 02/20/17 0510 02/20/17 0519  NA 140  --   K 3.2*  --   CL 104  --   CO2 29  --   GLUCOSE 121*  --   BUN 22*  --   CREATININE 1.13*  --   CALCIUM 9.4  --   MG  --  2.0   GFR: Estimated Creatinine Clearance: 34 mL/min (A) (by C-G formula based on SCr of 1.13 mg/dL (H)). Liver Function Tests:  Recent Labs Lab 02/20/17 0510  AST 28  ALT 22  ALKPHOS 54  BILITOT 0.4  PROT 7.0  ALBUMIN 3.9    Recent Labs Lab 02/20/17 0510  LIPASE 21   No results for input(s): AMMONIA in the  last 168 hours. Coagulation Profile: No results for input(s): INR, PROTIME in the last 168 hours. Cardiac Enzymes: No results for input(s): CKTOTAL, CKMB, CKMBINDEX, TROPONINI in the last 168 hours. BNP (last 3 results) No results for input(s): PROBNP in the last 8760 hours. HbA1C: No results for input(s): HGBA1C in the last 72 hours. CBG: No results for input(s): GLUCAP in the last 168 hours. Lipid Profile: No results for input(s): CHOL, HDL, LDLCALC, TRIG, CHOLHDL, LDLDIRECT in the last 72 hours. Thyroid Function Tests: No results for input(s): TSH, T4TOTAL, FREET4, T3FREE, THYROIDAB in the last 72 hours. Anemia Panel: No results for input(s): VITAMINB12, FOLATE, FERRITIN, TIBC, IRON, RETICCTPCT in the last 72 hours. Urine analysis: No results found for: COLORURINE, APPEARANCEUR, LABSPEC, PHURINE, GLUCOSEU, HGBUR, BILIRUBINUR, KETONESUR, PROTEINUR, UROBILINOGEN, NITRITE, LEUKOCYTESUR  Radiological Exams on Admission: Dg Chest 2 View  Result Date: 02/20/2017 CLINICAL DATA:  Central chest pain. EXAM: CHEST  2 VIEW COMPARISON:  03/26/2016 FINDINGS: Left-sided pacemaker remains in place, leads projecting over the right atrium and ventricle. Stable heart size and mediastinal contours with mild cardiomegaly. Increased interstitial bibasilar opacities are stable, may be related scarring or interstitial lung disease. No consolidation, pleural effusion, pulmonary edema or pneumothorax. No acute osseous abnormalities. IMPRESSION: 1. No acute abnormality. 2. Chronic basilar interstitial opacities. Electronically Signed   By: Jeb Levering M.D.   On: 02/20/2017 06:08    EKG: Independently reviewed. Vent. rate 60 BPM PR interval * ms QRS duration 118 ms QT/QTc 473/473 ms P-R-T axes * -51 18 Atrial-paced rhythm Left anterior fascicular block Probable left ventricular hypertrophy Anterior Q waves, possibly due to LVH No significant change since last tracing  Assessment/Plan Principal  Problem:   Chest pain Telemetry/observation. Supplemental oxygen as needed. Nitroglycerin as needed. Trend troponin levels. Check echocardiogram. Follow-up with Dr. Cristopher Peru after discharge.  Active Problems:   Hyperlipemia Continue pravastatin 40 mg by mouth daily.    Essential hypertension Continue Maxide 25-30 7.5 mg by mouth daily. Monitor blood pressure, renal function and electrolytes.    GERD Continue daily PPI.    Hypokalemia Increase KCl to 40 mEq daily.    Anemia of chronic disease Monitor hematocrit and hemoglobin.    Depression with anxiety Continue Celexa 10 mg by mouth daily. Continue buspirone 5 mg by mouth twice a day    Occipital headache Per patient, this has  been happening for several months. Single dose Toradol 50 mg IVP in the ER. Check C-spine x-ray.   DVT prophylaxis: Lovenox SQ. Code Status: Full code. Family Communication: Her daughter Brunetta Genera was present in the ED. Disposition Plan: Admitted for troponin level cycling and echocardiogram. Consults called:  Admission status: Observation/telemetry.   Reubin Milan MD [facility] Pager [phone].  If 7PM-7AM, please contact night-coverage [email or web address] Password [facility]  02/20/2017, 6:54 AM

## 2017-02-20 NOTE — ED Provider Notes (Signed)
[facility] DEPT Provider Note   CSN: [id number] Arrival date & time: 02/20/17  0448     History   Chief Complaint Chief Complaint  Patient presents with  . Chest Pain    HPI Candace Cervantes is a 83 y.o. female.  The history is provided by the patient and a relative.  Chest Pain   This is a new problem. The current episode started 1 to 2 hours ago. The problem has been gradually improving. Associated with: sleep. The pain is present in the substernal region. The pain is moderate. Quality: tightness. Radiates to: neck. Episode Length: unable to quantify. Associated symptoms include diaphoresis, nausea, shortness of breath, vomiting and weakness. She has tried nitroglycerin (ASA) for the symptoms. The treatment provided significant relief. Risk factors include being elderly.  Her past medical history is significant for CAD.  pt reports she had a dream she was lifting heavy bucket, and she woke up "sick" with chest tightness as well as nausea.  She then proceeded to have vomiting and she also had a BM (nonbloody, mostly formed) She reports taking ASA/NTG and now CP is resolved No ABD pain at this time She has not had this issue recently She had otherwise been well   Past Medical History:  Diagnosis Date  . Anxiety disorder   . CAD (coronary artery disease) 2005   Nonobstructive 40% RCA lesion and normal ejection fraction at cath   . Gastroesophageal reflux disease   . Hyperlipidemia   . Hypertension   . Osteoarthritis   . Osteoporosis   . Pacemaker 2002   Permanent Placement  . Pacemaker 2007   Generator Changed   . Sinoatrial node dysfunction (HCC)   . SSS (sick sinus syndrome) ([place])   . Zenker diverticulum    previously evaluated by Dr. Erik Obey in [id number].     Patient Active Problem List   Diagnosis Date Noted  . H/O dizziness 04/24/2016  . Depression with anxiety 04/21/2015  . Constipation 10/19/2014  . Colon cancer screening 09/27/2014  . IGT (impaired glucose  tolerance) 09/27/2014  . Annual physical exam 08/06/2014  . Osteopenia 02/20/2014  . Anemia of chronic disease 09/15/2013  . Atrial fibrillation ([place]) 05/16/2013  . Sinoatrial node dysfunction (HCC)   . CAD (coronary artery disease) 08/10/2011  . Cardiac pacemaker in situ 08/10/2011  . MCI (mild cognitive impairment) 12/15/2008  . Allergic rhinitis 09/20/2008  . ZENKER'S DIVERTICULUM 06/14/2008  . GERD 06/14/2008  . Hyperlipemia 02/15/2008  . ANXIETY DISORDER, GENERALIZED 02/15/2008  . Essential hypertension 02/15/2008  . OSTEOARTHRITIS 02/15/2008    Past Surgical History:  Procedure Laterality Date  . CHOLECYSTECTOMY    . COLONOSCOPY  2003   no polyps.  . CYSTECTOMY     from back of the neck  . ESOPHAGOGASTRODUODENOSCOPY  2008   Dr. Oneida Alar, difficulty passing scope through UES, incomplete fibrous ring at GEJ, hh, multiple benign gastri polyps  . ESOPHAGOGASTRODUODENOSCOPY N/A 10/17/2013   ZENKER'S, STRICTURE: 10-12.8 MM,  NSAID GASTRITIS FG POLYPS  . ESOPHAGOGASTRODUODENOSCOPY (EGD) WITH ESOPHAGEAL DILATION N/A 10/30/2013   SLF: 1. Zenkers diverticulum with a large opening at the cricopharyngeus 2. 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 Innumerable Fundic gland polyps 4. Mild NSAID gastritis  . MALONEY DILATION N/A 10/17/2013   Procedure: MALONEY DILATION;  Surgeon: Danie Binder, MD;  Location: [facility];  Service: Endoscopy;  Laterality: N/A;  with PEDS GASTROSCOPE  . PACEMAKER INSERTION  2002  . PACEMAKER PLACEMENT  2007   replacement   .  SAVORY DILATION N/A 10/17/2013   Procedure: SAVORY DILATION;  Surgeon: Danie Binder, MD;  [facility]: [facility];  Service: Endoscopy;  Laterality: N/A;  with PEDS GASTROSCOPE  . TOTAL ABDOMINAL HYSTERECTOMY  2002    OB History    No data available       Home Medications    Prior to Admission medications   Medication Sig Start Date End Date Taking? Authorizing Provider  busPIRone (BUSPAR) 5 MG tablet TAKE 1 TABLET(5 MG) BY  MOUTH TWICE DAILY 11/16/16   Fayrene Helper, MD  citalopram (CELEXA) 10 MG tablet TAKE 1 TABLET BY MOUTH DAILY 01/08/17   Fayrene Helper, MD  fluticasone (FLONASE) 50 MCG/ACT nasal spray Place 2 sprays into both nostrils daily. 08/07/15   Fayrene Helper, MD  meclizine (ANTIVERT) 25 MG tablet TAKE 1 TABLET(25 MG) BY MOUTH TWICE DAILY AS NEEDED 09/16/16   Fayrene Helper, MD  Multiple Vitamin (MULTIVITAMIN WITH MINERALS) TABS tablet Take 1 tablet by mouth daily.    [provider]  nitroGLYCERIN (NITROSTAT) 0.4 MG SL tablet Place 1 tablet (0.4 mg total) under the tongue every 5 (five) minutes as needed for chest pain. One tablet under the tongue at onset of chest pains, repeat every five minutes as needed 12/31/14   Fayrene Helper, MD  omeprazole (PRILOSEC) 20 MG capsule TAKE 1 CAPSULE BY MOUTH DAILY 02/02/17   Fayrene Helper, MD  potassium chloride SA (K-DUR,KLOR-CON) 20 MEQ tablet TAKE 1 TABLET BY MOUTH EVERY DAY 10/26/16   Fayrene Helper, MD  pravastatin (PRAVACHOL) 40 MG tablet TAKE 1 TABLET BY MOUTH EVERY DAY Patient not taking: Reported on 02/02/2017 05/30/15   Fayrene Helper, MD  sodium chloride (OCEAN) 0.65 % SOLN nasal spray Place 2 sprays into both nostrils as needed for congestion. 11/28/15   Fields, Marga Melnick, MD  triamterene-hydrochlorothiazide (MAXZIDE-25) 37.5-25 MG tablet Take 1 tablet by mouth daily. 04/10/16   Lendon Colonel, NP    Family History Family History  Problem Relation Age of Onset  . Heart disease Mother   . Lung disease Sister   . Cancer Brother   . Alcohol abuse Brother   . Colon cancer Other        sibling, age 63    Social History Social History  Substance Use Topics  . Smoking status: Never Smoker  . Smokeless tobacco: Never Used  . Alcohol use No     Allergies   Patient has no known allergies.   Review of Systems Review of Systems  Constitutional: Positive for diaphoresis and fatigue.  Respiratory: Positive  for shortness of breath.   Cardiovascular: Positive for chest pain.  Gastrointestinal: Positive for nausea and vomiting. Negative for blood in stool.  Neurological: Positive for weakness.       Generalized weakness   All other systems reviewed and are negative.    Physical Exam Updated Vital Signs BP 132/64 (BP Location: Right Arm)   Pulse 60   Temp 97.5 F (36.4 C) (Oral)   Resp 15   Ht 5\' 8"  (1.727 m)   Wt 167 lb (75.8 kg)   SpO2 96%   BMI 25.39 kg/m   Physical Exam  CONSTITUTIONAL:Elderly, but no acute distress.   HEAD: Normocephalic/atraumatic EYES: EOMI/PERRL ENMT: Mucous membranes moist NECK: supple no meningeal signs SPINE/BACK:entire spine nontender CV: S1/S2 noted, no loud murmurs noted Chest - pacemaker noted LUNGS: Lungs are clear to auscultation bilaterally, no apparent distress ABDOMEN: soft, nontender, no  rebound or guarding, bowel sounds noted throughout abdomen GU:no cva tenderness NEURO: Pt is awake/alert/appropriate, moves all extremitiesx4.  No facial droop.   EXTREMITIES: pulses normal/equalx4, full ROM, no lower extremity edema, no calf tenderness SKIN: warm, color normal PSYCH: no abnormalities of mood noted, alert and oriented to situation  ED Treatments / Results  Labs (all labs ordered are listed, but only abnormal results are displayed) Labs Reviewed  BASIC METABOLIC PANEL - Abnormal; Notable for the following:       Result Value   Potassium 3.2 (*)    Glucose, Bld 121 (*)    BUN 22 (*)    Creatinine, Ser 1.13 (*)    GFR calc non Af Amer 42 (*)    GFR calc Af Amer 48 (*)    All other components within normal limits  CBC - Abnormal; Notable for the following:    RBC 3.78 (*)    Hemoglobin 11.7 (*)    HCT 35.3 (*)    All other components within normal limits  HEPATIC FUNCTION PANEL  LIPASE, BLOOD  I-STAT TROPOININ, ED     EKG Interpretation  Date/Time:  Saturday Feb 20 2017 05:38:43 EDT Ventricular Rate:  60 PR Interval:      QRS Duration: 118 QT Interval:  473 QTC Calculation: 473 R Axis:   -51 Text Interpretation:  Atrial-paced rhythm Left anterior fascicular block Probable left ventricular hypertrophy Anterior Q waves, possibly due to LVH No significant change since last tracing Confirmed by Ripley Fraise [phone]) on 02/20/2017 6:07:30 AM         Radiology No results found.  Procedures Procedures (including critical care time)  Medications Ordered in ED Medications - No data to display   Initial Impression / Assessment and Plan / ED Course  I have reviewed the triage vital signs and the nursing notes.  Pertinent labs & imaging results that were available during my care of the patient were reviewed by me and considered in my medical decision making (see chart for details).     5:53 AM Elderly patient with known CAD presents with chest pressure/vomiting, CP responded to NTG High risk for ACS Will admit Pt currently awake/alert/comfortable 6:07 AM Pt stable No new complaints D/w dr Olevia Bowens for admission I asked nursing to attempt to interrogate pacemaker Pt otherwise awake/alert/appropriate at this time   Final Clinical Impressions(s) / ED Diagnoses   Final diagnoses:  Unstable angina ([place])  Dehydration    New Prescriptions New Prescriptions   No medications on file     Ripley Fraise, MD 02/20/17 [phone]

## 2017-02-20 NOTE — Discharge Summary (Signed)
Physician Discharge Summary  Candace Cervantes LOV:[id number] DOB: 06/04/1925 DOA: 02/20/2017  PCP: Fayrene Helper, MD  Admit date: 02/20/2017 Discharge date: 02/20/2017  Admitted From: home Disposition:  home  Recommendations for Outpatient Follow-up:  1. Follow up with PCP in 1-2 weeks 2. Please obtain BMP/CBC in one week 3. Follow-up with Dr. Lovena Le in 1-2 weeks  Home Health: Equipment/Devices:  Discharge Condition: stable CODE STATUS: full code Diet recommendation: Heart Healthy   Brief/Interim Summary: 83 y/o female with a history of sick sinus syndrome status post pacemaker, coronary artery disease, hypertension, who presents to the hospital with complaints of chest pain. Patient was admitted to the hospital and ruled out for ACS. She had negative cardiac enzymes and a nonacute EKG . since admission, she is not had any further symptoms. Echocardiogram was found to be unremarkable. She's been asked to follow-up with her primary cardiologist, Dr. Lovena Le. She does complain of shoulder, neck pain and associated occipital headache. This is likely musculoskeletal in origin. She'll be prescribed Robaxin and will continue on NSAIDs as needed. The remainder of her medical issues remained stable.  Discharge Diagnoses:  Principal Problem:   Chest pain Active Problems:   Hyperlipemia   Essential hypertension   GERD   Hypokalemia   Anemia of chronic disease   Depression with anxiety   Occipital headache    Discharge Instructions  Discharge Instructions    Diet - low sodium heart healthy    Complete by:  As directed    Increase activity slowly    Complete by:  As directed      Allergies as of 02/20/2017   No Known Allergies     Medication List    TAKE these medications   acetaminophen 500 MG tablet Commonly known as:  TYLENOL Take 500 mg by mouth every 6 (six) hours as needed for moderate pain.   busPIRone 5 MG tablet Commonly known as:  BUSPAR TAKE 1 TABLET(5 MG) BY  MOUTH TWICE DAILY   citalopram 10 MG tablet Commonly known as:  CELEXA TAKE 1 TABLET BY MOUTH DAILY   fluticasone 50 MCG/ACT nasal spray Commonly known as:  FLONASE Place 2 sprays into both nostrils daily. What changed:  when to take this  reasons to take this   meclizine 25 MG tablet Commonly known as:  ANTIVERT Take 25 mg by mouth 2 (two) times daily as needed for dizziness.   methocarbamol 500 MG tablet Commonly known as:  ROBAXIN Take 1 tablet (500 mg total) by mouth every 6 (six) hours as needed for muscle spasms.   multivitamin with minerals Tabs tablet Take 1 tablet by mouth daily.   nitroGLYCERIN 0.4 MG SL tablet Commonly known as:  NITROSTAT Place 1 tablet (0.4 mg total) under the tongue every 5 (five) minutes as needed for chest pain. One tablet under the tongue at onset of chest pains, repeat every five minutes as needed   omeprazole 20 MG capsule Commonly known as:  PRILOSEC TAKE 1 CAPSULE BY MOUTH DAILY   potassium chloride SA 20 MEQ tablet Commonly known as:  K-DUR,KLOR-CON Take 2 tablets (40 mEq total) by mouth daily. Start taking on:  02/21/2017 What changed:  See the new instructions.   pravastatin 40 MG tablet Commonly known as:  PRAVACHOL Take 1 tablet (40 mg total) by mouth daily. What changed:  See the new instructions.   triamterene-hydrochlorothiazide 37.5-25 MG tablet Commonly known as:  MAXZIDE-25 Take 1 tablet by mouth daily.  No Known Allergies  Consultations:     Procedures/Studies: Dg Chest 2 View  Result Date: 02/20/2017 CLINICAL DATA:  Central chest pain. EXAM: CHEST  2 VIEW COMPARISON:  03/26/2016 FINDINGS: Left-sided pacemaker remains in place, leads projecting over the right atrium and ventricle. Stable heart size and mediastinal contours with mild cardiomegaly. Increased interstitial bibasilar opacities are stable, may be related scarring or interstitial lung disease. No consolidation, pleural effusion, pulmonary  edema or pneumothorax. No acute osseous abnormalities. IMPRESSION: 1. No acute abnormality. 2. Chronic basilar interstitial opacities. Electronically Signed   By: Jeb Levering M.D.   On: 02/20/2017 06:08   Dg Cervical Spine 2 Or 3 Views  Result Date: 02/20/2017 CLINICAL DATA:  Occipital headache. EXAM: CERVICAL SPINE - 2-3 VIEW COMPARISON:  None. FINDINGS: O O degenerative facet disease diffusely. Mild degenerative disc disease in the lower cervical spine with anterior spurring. Normal alignment. Prevertebral soft tissues are normal. No fracture. IMPRESSION: Spondylosis.  No acute findings. Electronically Signed   By: Rolm Baptise M.D.   On: 02/20/2017 07:16    Echo: Mild LVH with LVEF 60-65% and grade 1 diastolic dysfunction. Mild   mitral regurgitation. Mildly sclerotic valve with trivial aortic   regurgitation. Device wire present within the right heart.   Trivial tricuspid regurgitation with PASP 29 mmHg.   Subjective: No further chest pain or shortness of breath.  Discharge Exam: Vitals:   02/20/17 0721 02/20/17 0813  BP: 121/75 130/68  Pulse: (!) 58 60  Resp: 17 16  Temp:  97.9 F (36.6 C)   Vitals:   02/20/17 0500 02/20/17 0700 02/20/17 0721 02/20/17 0813  BP: 119/66  121/75 130/68  Pulse: 61 (!) 59 (!) 58 60  Resp: 15 15 17 16   Temp:    97.9 F (36.6 C)  TempSrc:    Oral  SpO2: 97% 99% 99% 98%  Weight:    74.6 kg (164 lb 7.4 oz)  Height:    5\' 11"  (1.803 m)    General: Pt is alert, awake, not in acute distress Cardiovascular: RRR, S1/S2 +, no rubs, no gallops Respiratory: CTA bilaterally, no wheezing, no rhonchi Abdominal: Soft, NT, ND, bowel sounds + Extremities: no edema, no cyanosis    The results of significant diagnostics from this hospitalization (including imaging, microbiology, ancillary and laboratory) are listed below for reference.     Microbiology: No results found for this or any previous visit (from the past 240 hour(s)).   Labs: BNP  (last 3 results) No results for input(s): BNP in the last 8760 hours. Basic Metabolic Panel:  Recent Labs Lab 02/20/17 0510 02/20/17 0519  NA 140  --   K 3.2*  --   CL 104  --   CO2 29  --   GLUCOSE 121*  --   BUN 22*  --   CREATININE 1.13*  --   CALCIUM 9.4  --   MG  --  2.0   Liver Function Tests:  Recent Labs Lab 02/20/17 0510  AST 28  ALT 22  ALKPHOS 54  BILITOT 0.4  PROT 7.0  ALBUMIN 3.9    Recent Labs Lab 02/20/17 0510  LIPASE 21   No results for input(s): AMMONIA in the last 168 hours. CBC:  Recent Labs Lab 02/20/17 0510  WBC 4.6  HGB 11.7*  HCT 35.3*  MCV 93.4  PLT 154   Cardiac Enzymes:  Recent Labs Lab 02/20/17 1032 02/20/17 1638  TROPONINI <0.03 <0.03   BNP: Invalid input(s): POCBNP CBG:  No results for input(s): GLUCAP in the last 168 hours. D-Dimer No results for input(s): DDIMER in the last 72 hours. Hgb A1c No results for input(s): HGBA1C in the last 72 hours. Lipid Profile No results for input(s): CHOL, HDL, LDLCALC, TRIG, CHOLHDL, LDLDIRECT in the last 72 hours. Thyroid function studies No results for input(s): TSH, T4TOTAL, T3FREE, THYROIDAB in the last 72 hours.  Invalid input(s): FREET3 Anemia work up No results for input(s): VITAMINB12, FOLATE, FERRITIN, TIBC, IRON, RETICCTPCT in the last 72 hours. Urinalysis No results found for: COLORURINE, APPEARANCEUR, LABSPEC, [place], GLUCOSEU, HGBUR, BILIRUBINUR, KETONESUR, PROTEINUR, UROBILINOGEN, NITRITE, LEUKOCYTESUR Sepsis Labs Invalid input(s): PROCALCITONIN,  WBC,  LACTICIDVEN Microbiology No results found for this or any previous visit (from the past 240 hour(s)).   Time coordinating discharge: Over 30 minutes  SIGNED:   Kathie Dike, MD  [facility] 02/20/2017, 5:15 PM Pager   If 7PM-7AM, please contact night-coverage [email or web address] Password [email or web address]

## 2017-02-22 ENCOUNTER — Ambulatory Visit: Payer: Medicare Other

## 2017-02-23 ENCOUNTER — Telehealth: Payer: Self-pay

## 2017-02-23 NOTE — Telephone Encounter (Signed)
Transition Care Management Follow-up Telephone Call   Date discharged? 02/20/2017   How have you been since you were released from the hospital? Much better, no further chest pain just a little weak.   Do you understand why you were in the hospital? yes   Do you understand the discharge instructions? yes   Where were you discharged to? Home   Items Reviewed:  Medications reviewed: yes  Allergies reviewed: yes  Dietary changes reviewed: yes  Referrals reviewed: yes   Functional Questionnaire:   Activities of Daily Living (ADLs):   She states they are independent in the following: ambulation, bathing and hygiene, feeding, continence, grooming, toileting and dressing States they require assistance with the following: nothing   Any transportation issues/concerns?: no   Any patient concerns? no   Confirmed importance and date/time of follow-up visits scheduled yes  Provider Appointment booked with Dr. Moshe Cipro on 03/03/2017 at 2:00 pm.  Confirmed with patient if condition begins to worsen call PCP or go to the ER.  Patient was given the office number and encouraged to call back with question or concerns.  : yes

## 2017-03-03 ENCOUNTER — Telehealth: Payer: Self-pay

## 2017-03-03 ENCOUNTER — Ambulatory Visit: Payer: Medicare Other

## 2017-03-03 ENCOUNTER — Ambulatory Visit (INDEPENDENT_AMBULATORY_CARE_PROVIDER_SITE_OTHER): Payer: Medicare Other | Admitting: Family Medicine

## 2017-03-03 ENCOUNTER — Encounter: Payer: Self-pay | Admitting: Family Medicine

## 2017-03-03 VITALS — BP 146/76 | HR 68 | Temp 97.4°F | Resp 18 | Ht 71.0 in | Wt 162.0 lb

## 2017-03-03 DIAGNOSIS — R072 Precordial pain: Secondary | ICD-10-CM | POA: Diagnosis not present

## 2017-03-03 DIAGNOSIS — K2100 Gastro-esophageal reflux disease with esophagitis, without bleeding: Secondary | ICD-10-CM

## 2017-03-03 DIAGNOSIS — I10 Essential (primary) hypertension: Secondary | ICD-10-CM

## 2017-03-03 DIAGNOSIS — E785 Hyperlipidemia, unspecified: Secondary | ICD-10-CM | POA: Diagnosis not present

## 2017-03-03 DIAGNOSIS — Z09 Encounter for follow-up examination after completed treatment for conditions other than malignant neoplasm: Secondary | ICD-10-CM

## 2017-03-03 DIAGNOSIS — R7301 Impaired fasting glucose: Secondary | ICD-10-CM | POA: Diagnosis not present

## 2017-03-03 MED ORDER — PRAVASTATIN SODIUM 40 MG PO TABS: 40.0000 mg | ORAL_TABLET | Freq: Every day | ORAL | 5 refills | Status: DC

## 2017-03-03 NOTE — Patient Instructions (Addendum)
F/u as before  Wellness today  You are being referred to Dr Lovena Le and to Dr Oneida Alar as we discussed  CBC and chem 7 today   Please be careful not to fall

## 2017-03-04 LAB — CMP 10231
AG RATIO: 1.3 ratio (ref 1.0–2.5)
ALBUMIN: 4 g/dL (ref 3.6–5.1)
ALK PHOS: 62 U/L (ref 33–130)
ALT: 17 U/L (ref 6–29)
AST: 23 U/L (ref 10–35)
BILIRUBIN TOTAL: 0.4 mg/dL (ref 0.2–1.2)
BUN/Creatinine Ratio: 14.8 Ratio (ref 6–22)
BUN: 18 mg/dL (ref 7–25)
CALCIUM: 10.1 mg/dL (ref 8.6–10.4)
CO2: 30 mmol/L (ref 20–31)
CREATININE: 1.22 mg/dL — AB (ref 0.60–0.88)
Chloride: 103 mmol/L (ref 98–110)
GFR, EST NON AFRICAN AMERICAN: 39 mL/min — AB (ref >=60)
GFR, Est African American: 45 mL/min — ABNORMAL LOW (ref >=60)
GLOBULIN: 3.1 g/dL (ref 1.9–3.7)
GLUCOSE: 83 mg/dL (ref 65–99)
Potassium: 4.6 mmol/L (ref 3.5–5.3)
Sodium: 140 mmol/L (ref 135–146)
TOTAL PROTEIN: 7.1 g/dL (ref 6.1–8.1)

## 2017-03-04 LAB — HEMOGLOBIN A1C
Hgb A1c MFr Bld: 5.8 % — ABNORMAL HIGH (ref <5.7)
Mean Plasma Glucose: 120 mg/dL

## 2017-03-04 LAB — CBC
HCT: 37 % (ref 35.0–45.0)
Hemoglobin: 12.3 g/dL (ref 11.7–15.5)
MCH: 30.8 pg (ref 27.0–33.0)
MCHC: 33.2 g/dL (ref 32.0–36.0)
MCV: 92.7 fL (ref 80.0–100.0)
MPV: 9.1 fL (ref 7.5–12.5)
Platelets: 214 10*3/uL (ref 140–400)
RBC: 3.99 MIL/uL (ref 3.80–5.10)
RDW: 14.2 % (ref 11.0–15.0)
WBC: 4.9 10*3/uL (ref 3.8–10.8)

## 2017-03-04 LAB — TSH: TSH: 3.8 mIU/L

## 2017-03-04 LAB — LIPID PANEL
Cholesterol: 190 mg/dL (ref <200)
HDL: 96 mg/dL (ref >50)
LDL CALC: 77 mg/dL (ref <100)
TRIGLYCERIDES: 85 mg/dL (ref <150)
Total CHOL/HDL Ratio: 2 Ratio (ref <5.0)
VLDL: 17 mg/dL (ref <30)

## 2017-03-08 ENCOUNTER — Encounter: Payer: Self-pay | Admitting: Family Medicine

## 2017-03-08 NOTE — Assessment & Plan Note (Signed)
Recent hospitalization with chest pain, negative ACS, no local GI f/u since procedure at [facility] for Zenker's divericulum, refer to GI for eval

## 2017-03-08 NOTE — Assessment & Plan Note (Signed)
Pain resolved, however cardiology follow up recommended by hospitalist, will arrange same,

## 2017-03-08 NOTE — Assessment & Plan Note (Signed)
Adequate though sub optimal control, no med change

## 2017-03-08 NOTE — Progress Notes (Signed)
   [name]     MRN: [id number]      DOB: 02-Mar-1925   HPI [name] is here for follow up of recent hospitalization for chest pain, admitted on 5/19 and discharged later that day when she ruled out for ACS, f/u is recommended with her cardiologist as she has sick sinus syndrome, also has significant GERD and GI f/u is also indicated in my opinion States the robaxin she was prescribed helps her symptom alot    ROS Denies recent fever or chills. Denies sinus pressure, nasal congestion, ear pain or sore throat. Denies chest congestion, productive cough or wheezing. Denies current  chest pains, palpitation and leg swelling Denies abdominal pain, nausea, vomiting,diarrhea or constipation.   Denies dysuria, frequency, hesitancy or incontinence. Denies uncontrolled  joint pain, swelling and limitation in mobility. Denies headaches, seizures, numbness, or tingling. Denies depression, anxiety or insomnia. Denies skin break down or rash.   PE  BP (!) 146/76 (BP Location: Left Arm, Patient Position: Sitting, Cuff Size: Normal)   Pulse 68   Temp 97.4 F (36.3 C) (Temporal)   Resp 18   Ht 5\' 11"  (1.803 m)   Wt 162 lb (73.5 kg)   SpO2 99%   BMI 22.59 kg/m   Patient alert and oriented and in no cardiopulmonary distress.  HEENT: No facial asymmetry, EOMI,   oropharynx pink and moist.  Neck supple no JVD, no mass.  Chest: Clear to auscultation bilaterally.  CVS: S1, S2 no murmurs, no S3.Regular rate.  ABD: Soft non tender.   Ext: No edema  MS: Adequate ROM spine, shoulders, hips and knees.  Skin: Intact, no ulcerations or rash noted.  Psych: Good eye contact, normal affect. Memory intact not anxious or depressed appearing.  CNS: CN 2-12 intact, power,  normal throughout.no focal deficits noted.   [facility] discharge follow-up Pain resolved, however cardiology follow up recommended by hospitalist, will arrange same,   GERD Recent hospitalization with  chest pain, negative ACS, no local GI f/u since procedure at [facility] for Zenker's divericulum, refer to GI for eval  Essential hypertension Adequate though sub optimal control, no med change

## 2017-03-09 ENCOUNTER — Encounter: Payer: Self-pay | Admitting: Gastroenterology

## 2017-03-10 NOTE — Telephone Encounter (Signed)
Unable to reach patient.

## 2017-03-16 ENCOUNTER — Encounter: Payer: Self-pay | Admitting: Internal Medicine

## 2017-03-23 ENCOUNTER — Other Ambulatory Visit: Payer: Self-pay

## 2017-03-24 ENCOUNTER — Other Ambulatory Visit: Payer: Self-pay

## 2017-03-25 ENCOUNTER — Other Ambulatory Visit: Payer: Self-pay

## 2017-03-25 MED ORDER — METHOCARBAMOL 500 MG PO TABS: 500.0000 mg | ORAL_TABLET | Freq: Four times a day (QID) | ORAL | 0 refills | Status: DC | PRN

## 2017-04-01 ENCOUNTER — Ambulatory Visit (INDEPENDENT_AMBULATORY_CARE_PROVIDER_SITE_OTHER): Payer: Medicare Other | Admitting: Adult Health

## 2017-04-01 ENCOUNTER — Encounter: Payer: Self-pay | Admitting: Adult Health

## 2017-04-01 VITALS — BP 135/85 | HR 61 | Ht 71.0 in | Wt 159.2 lb

## 2017-04-01 DIAGNOSIS — I10 Essential (primary) hypertension: Secondary | ICD-10-CM

## 2017-04-01 DIAGNOSIS — I480 Paroxysmal atrial fibrillation: Secondary | ICD-10-CM

## 2017-04-01 DIAGNOSIS — Z950 Presence of cardiac pacemaker: Secondary | ICD-10-CM

## 2017-04-01 DIAGNOSIS — I2510 Atherosclerotic heart disease of native coronary artery without angina pectoris: Secondary | ICD-10-CM

## 2017-04-01 DIAGNOSIS — R42 Dizziness and giddiness: Secondary | ICD-10-CM | POA: Diagnosis not present

## 2017-04-01 MED ORDER — LOSARTAN POTASSIUM 50 MG PO TABS: 50.0000 mg | ORAL_TABLET | Freq: Every day | ORAL | 3 refills | Status: DC

## 2017-04-01 NOTE — Patient Instructions (Addendum)
Your physician recommends that you schedule a follow-up appointment Blood Pressure Check.   Your physician recommends that you schedule a follow-up appointment in: 3 Months   Your physician has recommended you make the following change in your medication:   STOP Taking Maxzide   Start Taking Losartan 50 mg Daily   If you need a refill on your cardiac medications before your next appointment, please call your pharmacy.  Thank you for choosing [facility]!

## 2017-04-01 NOTE — Progress Notes (Signed)
Cardiology Office Note   Date:  04/01/2017   ID:  Candace Cervantes, Candace Cervantes May 06, 1925, MRN [id number]  PCP:  Fayrene Helper, MD  Cardiologist:  Marge Duncans chief complaint on file.     History of Present Illness: Candace Cervantes is a 83 y.o. female who presents for ongoing assessment and management of atrial fibrillation, hypertension, symptomatic bradycardia status post pacemaker placement, and CAD. The patient is followed by Dr. Lovena Le for pacemaker management as well. Patient was last seen in the office by Dr. Lovena Le on 12/17/2016. At that time blood pressure was well-controlled, her [employer] device was interrogated and working appropriately, was noted to have of 24 minutes of atrial fibrillation, was not placed on anticoagulation due to frequent falling.  She is here today complaining of dizziness, feeling unsteady on her feet. Her husband died in 01-02-2023 and she is living alone now, and has issues with forgetfulness. Family members check on her often. She denies chest pain, near syncope, she is not eating well, she does have a woman who comes in daily to help her, and does some cooking.   Past Medical History:  Diagnosis Date  . Anxiety disorder   . CAD (coronary artery disease) 2005   Nonobstructive 40% RCA lesion and normal ejection fraction at cath   . Gastroesophageal reflux disease   . Hyperlipidemia   . Hypertension   . Osteoarthritis   . Osteoporosis   . Pacemaker 2002   Permanent Placement  . Pacemaker 2007   Generator Changed   . Sinoatrial node dysfunction (HCC)   . SSS (sick sinus syndrome) ([place])   . Zenker diverticulum    previously evaluated by Dr. Erik Obey in 1638.     Past Surgical History:  Procedure Laterality Date  . CHOLECYSTECTOMY    . COLONOSCOPY  2003   no polyps.  . CYSTECTOMY     from back of the neck  . ESOPHAGOGASTRODUODENOSCOPY  2008   Dr. Oneida Alar, difficulty passing scope through UES, incomplete fibrous ring at GEJ, hh, multiple benign gastri  polyps  . ESOPHAGOGASTRODUODENOSCOPY N/A 10/17/2013   ZENKER'S, STRICTURE: 10-12.8 MM,  NSAID GASTRITIS FG POLYPS  . ESOPHAGOGASTRODUODENOSCOPY (EGD) WITH ESOPHAGEAL DILATION N/A 10/30/2013   SLF: 1. Zenkers diverticulum with a large opening at the cricopharyngeus 2. 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 Innumerable Fundic gland polyps 4. Mild NSAID gastritis  . MALONEY DILATION N/A 10/17/2013   Procedure: MALONEY DILATION;  Surgeon: Danie Binder, MD;  Location: [facility];  Service: Endoscopy;  Laterality: N/A;  with PEDS GASTROSCOPE  . PACEMAKER INSERTION  2002  . PACEMAKER PLACEMENT  2007   replacement   . SAVORY DILATION N/A 10/17/2013   Procedure: SAVORY DILATION;  Surgeon: Danie Binder, MD;  Location: [facility];  Service: Endoscopy;  Laterality: N/A;  with PEDS GASTROSCOPE  . TOTAL ABDOMINAL HYSTERECTOMY  2002     Current Outpatient Prescriptions  Medication Sig Dispense Refill  . acetaminophen (TYLENOL) 500 MG tablet Take 500 mg by mouth every 6 (six) hours as needed for moderate pain.    . busPIRone (BUSPAR) 5 MG tablet TAKE 1 TABLET(5 MG) BY MOUTH TWICE DAILY 180 tablet 1  . citalopram (CELEXA) 10 MG tablet TAKE 1 TABLET BY MOUTH DAILY 90 tablet 1  . famotidine (PEPCID) 10 MG tablet Take 10 mg by mouth daily.    . fluticasone (FLONASE) 50 MCG/ACT nasal spray Place 2 sprays into both nostrils daily. (Patient taking differently: Place 2 sprays into  both nostrils daily as needed for allergies. ) 16 g 2  . meclizine (ANTIVERT) 25 MG tablet Take 25 mg by mouth 2 (two) times daily as needed for dizziness.    . methocarbamol (ROBAXIN) 500 MG tablet Take 1 tablet (500 mg total) by mouth every 6 (six) hours as needed for muscle spasms. 30 tablet 0  . Multiple Vitamin (MULTIVITAMIN WITH MINERALS) TABS tablet Take 1 tablet by mouth daily.    . nitroGLYCERIN (NITROSTAT) 0.4 MG SL tablet Place 1 tablet (0.4 mg total) under the tongue every 5 (five) minutes as needed for chest pain. One  tablet under the tongue at onset of chest pains, repeat every five minutes as needed 25 tablet 1  . omeprazole (PRILOSEC) 20 MG capsule TAKE 1 CAPSULE BY MOUTH DAILY 90 capsule 1  . potassium chloride SA (K-DUR,KLOR-CON) 20 MEQ tablet Take 2 tablets (40 mEq total) by mouth daily. 60 tablet 0  . pravastatin (PRAVACHOL) 40 MG tablet Take 1 tablet (40 mg total) by mouth daily. 30 tablet 5  . losartan (COZAAR) 50 MG tablet Take 1 tablet (50 mg total) by mouth daily. 90 tablet 3   No current facility-administered medications for this visit.     Allergies:   Patient has no known allergies.    Social History:  The patient  reports that she has never smoked. She has never used smokeless tobacco. She reports that she does not drink alcohol or use drugs.   Family History:  The patient's family history includes Alcohol abuse in her brother; Cancer in her brother; Colon cancer in her other; Heart disease in her mother; Lung disease in her sister.    ROS: All other systems are reviewed and negative. Unless otherwise mentioned in H&P    PHYSICAL EXAM: VS:  BP 135/85   Pulse 61   Ht 5\' 11"  (1.803 m)   Wt 159 lb 3.2 oz (72.2 kg)   BMI 22.20 kg/m  , BMI Body mass index is 22.2 kg/m. GEN: Well nourished, well developed, in no acute distress  HEENT: normal  Neck: no JVD, carotid bruits, or masses Cardiac: RRR; occasional extrasystole no murmurs, rubs, or gallops,no edema  Respiratory:  clear to auscultation bilaterally, normal work of breathing GI: soft, nontender, nondistended, + BS MS: no deformity or atrophy use his cane to walk some unsteadiness with ambulation. Skin: warm and dry, no rash Neuro:  Strength and sensation are intact Psych: euthymic mood, full affect  Recent Labs: 03/21/2017: Magnesium 2.0 03/03/2017: ALT 17; BUN 18; Creat 1.22; Hemoglobin 12.3; Platelets 214; Potassium 4.6; Sodium 140; TSH 3.80    Lipid Panel    Component Value Date/Time   CHOL 190 03/03/2017 1455   TRIG  85 03/03/2017 1455   HDL 96 03/03/2017 1455   CHOLHDL 2.0 03/03/2017 1455   VLDL 17 03/03/2017 1455   LDLCALC 77 03/03/2017 1455      Wt Readings from Last 3 Encounters:  04/01/17 159 lb 3.2 oz (72.2 kg)  03/03/17 162 lb (73.5 kg)  03/21/17 164 lb 7.4 oz (74.6 kg)      Other studies Reviewed: Echocardiogram March 21, 2017 Left ventricle: The cavity size was normal. Wall thickness was   increased in a pattern of mild LVH. Systolic function was normal.   The estimated ejection fraction was in the range of 60% to 65%.   Wall motion was normal; there were no regional wall motion   abnormalities. Doppler parameters are consistent with abnormal   left ventricular relaxation (  grade 1 diastolic dysfunction). - Aortic valve: Mildly calcified annulus. Trileaflet; mildly   calcified leaflets. There was trivial regurgitation. - Mitral valve: There was mild regurgitation. - Right ventricle: Pacer wire or catheter noted in right ventricle. - Atrial septum: No defect or patent foramen ovale was identified. - Tricuspid valve: There was trivial regurgitation. - Pulmonary arteries: PA peak pressure: 29 mm Hg (S). - Pericardium, extracardiac: There was no pericardial effusion.  ASSESSMENT AND PLAN:  1.  Hypertension: I reviewed all of her medications and see that she is on Triametrene HCTZ. Concern that the HCTZ portion is making her feel lightheaded and dizzy especially with position change and ambulation. She has been taking meclizine twice a day to help her with her dizziness. I'm going to stop the Maxzide, and change it to low-dose losartan 50 mg daily removing the diuretic portion of the medication in this very elderly woman. She is going to come back in a week to have her blood pressure checked and ascertain response to medication changes to see if this helps with her positional dizziness and unsteadiness.  2. Pacemaker in situ: She is followed by Dr. Lovena Le, with ongoing interrogations per  protocol. No changes in her medication regimen.  3. Atrial fibrillation: Heart rate is currently well-controlled. She is not a candidate for anticoagulation as she is unsteady on her feet and has significant dizziness.  4. Dizziness and unsteadiness: Uncertain if this is related to medication or aging. I have instructed her to have family or friends around her at all 2 for vent falls. She is not to cook unless someone is with her as she is becoming very forgetful. I'm concerned about dementia in this elderly woman. She is to follow-up with her primary care physician. Hopefully changes in medications removing diuretic will be helpful but I do not think it will solve all the problems.     Current medicines are reviewed at length with the patient today.    Labs/ tests ordered today include:  Phill Myron. West Pugh, ANP, AACC   04/01/2017 5:45 PM    [facility] [address] [place], [place] [address] Phone: [phone]; Fax: [phone]

## 2017-04-05 ENCOUNTER — Other Ambulatory Visit: Payer: Self-pay

## 2017-04-08 ENCOUNTER — Ambulatory Visit (INDEPENDENT_AMBULATORY_CARE_PROVIDER_SITE_OTHER): Payer: Medicare Other

## 2017-04-08 VITALS — BP 164/76 | HR 66 | Wt 164.0 lb

## 2017-04-08 DIAGNOSIS — I10 Essential (primary) hypertension: Secondary | ICD-10-CM

## 2017-04-08 MED ORDER — TRIAMTERENE-HCTZ 37.5-25 MG PO TABS: 1.0000 | ORAL_TABLET | Freq: Every day | ORAL | 6 refills | Status: DC

## 2017-04-08 NOTE — Progress Notes (Signed)
Pt arrives for BP check.Was taken off Maxzide and HCTZ.Started on Losartan 50 mg daily. Yesterday noted feet swelling.Has gained 5 lbs since last visit last week. Of note, she hands me bottle of Buspar which she thought was her BP med Losartan.On the side of the bottle, it mentioned dizziness as a side effect. Patient reports dizziness is better though.    After speaking with Dr Purcell Nails, verbal order as given for patient to stop Losartan, and return to Maxzide 37.5-25 mg daily.She will return in 1 week for BP check. Patient is also advised to speak with Dr Moshe Cipro regarding possible side effects with Buspar and dizziness.

## 2017-04-08 NOTE — Patient Instructions (Addendum)
Return in 1 week for blood pressure check   STOP Losartan (Cozaar)  50 mg   Go back to taking Maxzide 37.5-25 mg daily. I called in a 30 day supply to your pharmacy.    Talk to Dr Moshe Cipro about the Buspar.It could be causing your dizziness      Thank you for choosing [facility] !

## 2017-04-15 ENCOUNTER — Ambulatory Visit (INDEPENDENT_AMBULATORY_CARE_PROVIDER_SITE_OTHER): Payer: Medicare Other | Admitting: Gastroenterology

## 2017-04-15 ENCOUNTER — Encounter: Payer: Self-pay | Admitting: Gastroenterology

## 2017-04-15 ENCOUNTER — Ambulatory Visit: Payer: Medicare Other | Admitting: *Deleted

## 2017-04-15 VITALS — BP 160/80 | HR 60 | Wt 170.0 lb

## 2017-04-15 VITALS — BP 155/72 | HR 64 | Temp 97.8°F | Ht 70.0 in | Wt 160.4 lb

## 2017-04-15 DIAGNOSIS — I10 Essential (primary) hypertension: Secondary | ICD-10-CM

## 2017-04-15 DIAGNOSIS — K219 Gastro-esophageal reflux disease without esophagitis: Secondary | ICD-10-CM

## 2017-04-15 DIAGNOSIS — I4891 Unspecified atrial fibrillation: Secondary | ICD-10-CM

## 2017-04-15 NOTE — Patient Instructions (Signed)
1. Continue omeprazole once daily before breakfast for acid reflux. 2. Call and let us know which over the counter medication you are taking for acid reflux. [phone]. 3. Return to the office in one year or sooner if needed.

## 2017-04-15 NOTE — Progress Notes (Signed)
Primary Care Physician: Fayrene Helper, MD  Primary Gastroenterologist:  Barney Drain, MD   Chief Complaint  Patient presents with  . Gastroesophageal Reflux    HPI: Candace Cervantes is a 83 y.o. female here for follow-up. Last seen in February 2017. She has a history of Zenker's diverticulum excised at [facility] in December 2016. History of GERD. Clinically she has been doing fairly well from a GI standpoint. Her reflux is fairly well-controlled. She states she's been taking something over-the-counter recently and she wonders if his turning her stools dark brown. She denies black stools. She cannot recall the name and will call us with it later today. She denies dysphagia or vomiting. For the most part her bowel function is good. Stools are soft and most days. No melena rectal bleeding. Denies abdominal pain. She complains of red itchy lesions that of been coming and going over the past few months. She states she spoke with her PCP and was felt that they may be bug bites. Patient states she had an exterminator, looking for bedbugs, aunts or anything else her house and didn't find anything. She does not have animals in her house. She does go outside for some light gardening in the late afternoons.  Patient was in hospital back in May with chest pain. She also has significant anxiety, sick sinus syndrome status post pacemaker placement, CAD. She woke up from sleep with precordial chest pain coming from the upper stomach and radiating to the neck and the back of her head. Nausea without emesis. Diaphoresis. Pain relieved with sublingual nitroglycerin. She is admitted to the hospital ruled out for ACS. With admission she had no further symptoms. She has followed up with cardiology.  Current Outpatient Prescriptions  Medication Sig Dispense Refill  . acetaminophen (TYLENOL) 500 MG tablet Take 500 mg by mouth every 6 (six) hours as needed for moderate pain.    . busPIRone (BUSPAR) 5 MG tablet  TAKE 1 TABLET(5 MG) BY MOUTH TWICE DAILY 180 tablet 1  . citalopram (CELEXA) 10 MG tablet TAKE 1 TABLET BY MOUTH DAILY 90 tablet 1  . famotidine (PEPCID) 10 MG tablet Take 10 mg by mouth daily.    . fluticasone (FLONASE) 50 MCG/ACT nasal spray Place 2 sprays into both nostrils daily. (Patient taking differently: Place 2 sprays into both nostrils daily as needed for allergies. ) 16 g 2  . losartan (COZAAR) 50 MG tablet Take 50 mg by mouth daily.    . meclizine (ANTIVERT) 25 MG tablet Take 25 mg by mouth 2 (two) times daily as needed for dizziness.    . methocarbamol (ROBAXIN) 500 MG tablet Take 1 tablet (500 mg total) by mouth every 6 (six) hours as needed for muscle spasms. 30 tablet 0  . Multiple Vitamin (MULTIVITAMIN WITH MINERALS) TABS tablet Take 1 tablet by mouth daily.    . nitroGLYCERIN (NITROSTAT) 0.4 MG SL tablet Place 1 tablet (0.4 mg total) under the tongue every 5 (five) minutes as needed for chest pain. One tablet under the tongue at onset of chest pains, repeat every five minutes as needed 25 tablet 1  . omeprazole (PRILOSEC) 20 MG capsule TAKE 1 CAPSULE BY MOUTH DAILY 90 capsule 1  . potassium chloride SA (K-DUR,KLOR-CON) 20 MEQ tablet Take 2 tablets (40 mEq total) by mouth daily. 60 tablet 0  . pravastatin (PRAVACHOL) 40 MG tablet Take 1 tablet (40 mg total) by mouth daily. 30 tablet 5  . triamterene-hydrochlorothiazide (MAXZIDE-25) 37.5-25 MG  tablet Take 1 tablet by mouth daily. 30 tablet 6   No current facility-administered medications for this visit.     Allergies as of 04/15/2017  . (No Known Allergies)    ROS:  General: Negative for anorexia, weight loss, fever, chills, fatigue, weakness. ENT: Negative for hoarseness, difficulty swallowing , nasal congestion. CV: Negative for chest pain, angina, palpitations, dyspnea on exertion, peripheral edema.  Respiratory: Negative for dyspnea at rest, dyspnea on exertion, cough, sputum, wheezing.  GI: See history of present  illness. GU:  Negative for dysuria, hematuria, urinary incontinence, urinary frequency, nocturnal urination.  Endo: Negative for unusual weight change.    Physical Examination:   BP (!) 155/72   Pulse 64   Temp 97.8 F (36.6 C) (Oral)   Ht 5\' 10"  (1.778 m)   Wt 160 lb 6.4 oz (72.8 kg)   BMI 23.02 kg/m   General: Well-nourished, well-developed in no acute distress.  Eyes: No icterus. Mouth: Oropharyngeal mucosa moist and pink , no lesions erythema or exudate. Lungs: Clear to auscultation bilaterally.  Heart: Regular rate and rhythm, no murmurs rubs or gallops.  Abdomen: Bowel sounds are normal, nontender, nondistended, no hepatosplenomegaly or masses, no abdominal bruits or hernia , no rebound or guarding.   Extremities: No lower extremity edema. No clubbing or deformities. Neuro: Alert and oriented x 4   Skin: Warm and dry, no jaundice.  Several small erythematous lesions (4-5) noted on both lower extremities, one on the left lateral elbow. She reported significant itching on her right back but I cannot see any abnormal skin lesions today. Psych: Alert and cooperative, normal mood and affect.  Labs:  Lab Results  Component Value Date   CREATININE 1.22 (H) 03/03/2017   BUN 18 03/03/2017   NA 140 03/03/2017   K 4.6 03/03/2017   CL 103 03/03/2017   CO2 30 03/03/2017   Lab Results  Component Value Date   ALT 17 03/03/2017   AST 23 03/03/2017   ALKPHOS 62 03/03/2017   BILITOT 0.4 03/03/2017   Lab Results  Component Value Date   WBC 4.9 03/03/2017   HGB 12.3 03/03/2017   HCT 37.0 03/03/2017   MCV 92.7 03/03/2017   PLT 214 03/03/2017    Imaging Studies: No results found.

## 2017-04-15 NOTE — Assessment & Plan Note (Addendum)
Clinically seems be doing well from a GI standpoint. GERD seems to be well controlled. Patient tells me that she started some over-the-counter antacids in addition to her omeprazole but she cannot recall the name. Pepcid 10 mg daily and omeprazole 20 mg daily both of her drug list. Stool somewhat dark brown with new medication. No melena. Denies abdominal pain. Appetite is good. No weight loss. No further chest pain. No swallowing issues.  Patient will call back with over-the-counter medication name. Return to the office in one year or sooner if needed.  Patient spent the majority of visit discussing an itchy rash that has been intermittent over the past several months. She reports that she is discussed with PCP. She has small pea-sized erythematous lesions which are itchy and noted mostly on the lower extremities and on one of her elbows. Suspect bug bites. Patient quite concerned and anxious about them, advised her to discuss further with PCP to consider ruling out other etiologies, consider dermatology appointment if persisting.

## 2017-04-16 ENCOUNTER — Telehealth: Payer: Self-pay

## 2017-04-16 NOTE — Progress Notes (Signed)
cc'ed to pcp °

## 2017-04-16 NOTE — Telephone Encounter (Signed)
Noted. We do have that on her drug list.

## 2017-04-16 NOTE — Telephone Encounter (Signed)
Pt came by office and brought her medications. LSL was wanting to know what OTC med she takes for acid reflux. She takes Famotidine 10mg  once a day. She said the Famotidine is helping her. Routing to LSL.

## 2017-04-16 NOTE — Addendum Note (Signed)
Addended by: Levonne Hubert on: 04/16/2017 11:27 AM   Modules accepted: Orders

## 2017-04-19 ENCOUNTER — Telehealth: Payer: Self-pay | Admitting: Pharmacist

## 2017-04-19 ENCOUNTER — Other Ambulatory Visit: Payer: Self-pay | Admitting: Family Medicine

## 2017-04-19 NOTE — Patient Outreach (Signed)
[facility]) Care Management  04/19/2017  Candace Cervantes 05-04-1925 [id number]  Successful telephone call to Candace Cervantes, 83 year old female, referred to [facility] by her provider Dr. Leonia Reader, for medication management.  PMHx includes, but not limited to, atrial fibrillation, CAD, sick sinus syndrome with permanent pacemaker, HTN, HLD, anxiety, depression, GERD, allergic rhinitis, and mild cognitive impairment.  HIPAA identifiers verified.    Patient states she has had changes to her medications recently and it is hard for her to keep up with what she should be taking.  She states she gets easily confused and often forgets to take her evening doses of medications.  She is not using a pillbox.  She states she is still driving and either herself or her daughter picks up her medicine from [facility] in [place].  Patient is unable to read her medication labels from her bottles even with her glasses or remember what she is taking to perform medication review telephonically.    I reviewed blister-packing services with patient and she is very agreeable to try this.  [facility] in [place], [place] will blister-pack and deliver to patient at no extra charge.  They will transfer patient's prescriptions from [facility] and blister-pack when refills are due.    Plan: Home visit scheduled with patient for this Wednesday at 11:00 AM for medication review.    Ralene Bathe, PharmD, [place] [phone]

## 2017-04-20 ENCOUNTER — Other Ambulatory Visit: Payer: Self-pay | Admitting: *Deleted

## 2017-04-20 NOTE — Patient Outreach (Addendum)
[facility]) Care Management  04/20/2017  Candace Cervantes 1925/04/15 [id number]  Telephone Screen  Referral Date: 04/16/17 Referral Source: MD referral Referral Reason: Medical Management Insurance: [facility] Medicarel  Outreach attempt #1 to patient. No answer. RN CM unable to leave HIPAA compliant message.     Plan: RN CM will contact patient within 4 business days.  Lake Bells, RN, BSN, MHA/MSL, [place] Telephonic Care Manager Coordinator [facility] Direct Phone: [phone] Toll Free: [phone] Fax: [phone]

## 2017-04-21 ENCOUNTER — Other Ambulatory Visit: Payer: Self-pay | Admitting: Pharmacist

## 2017-04-21 ENCOUNTER — Other Ambulatory Visit: Payer: Self-pay | Admitting: *Deleted

## 2017-04-21 DIAGNOSIS — G3184 Mild cognitive impairment, so stated: Secondary | ICD-10-CM

## 2017-04-21 MED ORDER — TRIAMTERENE-HCTZ 37.5-25 MG PO TABS: 1.0000 | ORAL_TABLET | Freq: Every day | ORAL | 3 refills | Status: DC

## 2017-04-21 MED ORDER — NITROGLYCERIN 0.4 MG SL SUBL: 0.4000 mg | SUBLINGUAL_TABLET | SUBLINGUAL | 3 refills | Status: DC | PRN

## 2017-04-21 NOTE — Patient Outreach (Signed)
[facility]) Care Management  04/21/2017  Candace Cervantes [birth year] [id number]  Subjective:  Home visit today with Candace Cervantes at her residence.  HIPAA identifiers verified.  [facility] consent form signed.  Patient has all her pill bottles ready for review at her kitchen table.  Some of her pill bottles have stickers to differentiate morning and evening.  She has not taken any of her medications yet today except an OTC famotidine.  She has a large pile of loose tablets including several duplicates on her napkin which she states are a combination of pills she was supposed to take yesterday mixed with what she is supposed to take today.  She states she often forgets to take her medications.  Patient has several duplicate bottles of medications as well as expired medications in her possession.  Patient has a hard time reading the labels of her medications.  She states she is afraid she is going to fall and that she staggers often in her house.  She is upset that her husband died in 18-Dec-2016 and her "poor son" is trying to take her money and that her "good son" is telling her she should share her money.  She states she is very glad I came to review her medications with her so I can "take care of all of it." Patient requests [facility] CM RN and LCSW referrals in addition to pharmacy.      Objective:  Medications Reviewed Today    Reviewed by Candace Cervantes, RPH (Pharmacist) on 04/21/17 at 1226  Med List Status: <None>  Medication Order Taking? Sig Documenting Provider Last Dose Status Informant        Discontinued 04/21/17 1218 (Dose change)   ACETAMINOPHEN EXTRA STRENGTH PO [id number] No Take 650 mg by mouth daily as needed (pain). [provider] Not Taking Active   busPIRone (BUSPAR) 5 MG tablet [id number] Yes TAKE 1 TABLET(5 MG) BY MOUTH TWICE DAILY Candace Helper, MD Taking Active Self  citalopram (CELEXA) 10 MG tablet [id number] Yes TAKE 1 TABLET BY MOUTH DAILY Candace Helper, MD Taking Active Self  famotidine (PEPCID) 10 MG tablet [id number] Yes Take 10 mg by mouth daily. [provider] Taking Active        Patient taking differently:       Discontinued 04/21/17 1222 (Patient has not taken in last 30 days)         Discontinued 04/21/17 1209 (Discontinued by provider)   meclizine (ANTIVERT) 25 MG tablet [id number] Yes Take 25 mg by mouth 2 (two) times daily as needed for dizziness. [provider] Taking Active Self  methocarbamol (ROBAXIN) 500 MG tablet [id number] Yes Take 1 tablet (500 mg total) by mouth every 6 (six) hours as needed for muscle spasms. Candace Helper, MD Taking Active   Multiple Vitamin (MULTIVITAMIN WITH MINERALS) TABS tablet [id number] Yes Take 1 tablet by mouth daily. [provider] Taking Active Self  nitroGLYCERIN (NITROSTAT) 0.4 MG SL tablet [id number] Yes Place 1 tablet (0.4 mg total) under the tongue every 5 (five) minutes as needed for chest pain. One tablet under the tongue at onset of chest pains, repeat every five minutes as needed Candace Helper, MD Taking Active Self  omeprazole (PRILOSEC) 20 MG capsule [id number] Yes TAKE 1 CAPSULE BY MOUTH DAILY Candace Helper, MD Taking Active Self  potassium chloride SA (K-DUR,KLOR-CON) 20 MEQ tablet [id number] Yes Take 2 tablets (40 mEq total) by mouth daily. Kathie Dike, MD Taking Active  pravastatin (PRAVACHOL) 40 MG tablet [id number] Yes Take 1 tablet (40 mg total) by mouth daily. Candace Helper, MD Taking Active   triamterene-hydrochlorothiazide [facility]) 37.5-25 MG tablet [id number] Yes Take 1 tablet by mouth daily. Candace Colonel, NP Taking Active          Assessment:   Drugs sorted by system:  Neurologic/Psychologic:Buspirone, citalopram, PRN meclizine  Cardiovascular: Pravastatin, triamterene-HCTZ, potassium, PRN NTG SL  Gastrointestinal: Famotidine, omeprazole  Pain: PRN acetaminophen, PRN aspirin, PRN  methocarbamol  Vitamins/Minerals: Women's MVI  Medications to avoid in the elderly:   Meclizine:   Per the Beers List, the antihistamine in the medication is highly anticholinergic, clearance may be reduced with advancing age, tolerance may develop when used as a hypnotic and should be avoided in the elderly.  Antihistamine use in the elderly poses a greater risk of confusion, dry mouth, constipation, or other anticholinergic effects or toxicity.  Per Beers list, medications with strong anticholinergic properties should be avoided in older adults with dementia and cognitive impairment, those with or at high risk of delirium as well as in patients with constipation.  Per Beers list, this medication should be avoided in older adults with lower urinary tract symptoms and in older men with BPH.   Methocarbamol:   Per the Beers List, most muscle relaxants and antispasmodic drugs are poorly tolerated by elderly patients, since these can cause anticholinergic adverse effects, sedation, and weakness.  Additionally, their effectiveness at doses tolerated by elderly patients is questionable.  Geriatric patients may have age-related decreased in renal function.  Dosage decreases, alternative therapy and/or closer monitoring may be indicated.    Drug interactions:   Monitor potassium closely as patient on a potassium sparing diuretic and potassium supplement.    Other issues noted:  Patient very confused on how she should be taking her medications.  She may have been taking both her losartan and triamterene-HCTZ as well as doubling up on her medications some days and forgetting other days.  She is not a good candidate for a pillbox as it does not appear she has family assisting with medications and patient unable to fill pillbox herself.    Patient still had bottle of losartan in her possession mixed in with her other scheduled medications even though it was stopped recently.    Expired, opened bottle of  NTG, expired saline NS, empty celebrex and atorvastatin pill bottles, and empty MVI bottle.   Duplicate pill bottles of pravastatin and citalopram.   Patient still had PRN alprazolam tablets in her possession.    Plan: 1.  All of patient's medications were transferred to [facility] in [place].  I personally delivered patient's current medication bottles to [facility] so they can immediately blister-pack for patient and delivery to her home Thursday or Friday morning.  Otherwise, pharmacy would have to wait until refills are due which would not be for another 1-2 months.   I left patient doses of her medications from today through Friday morning in a pillbox.  Patient was able to show me how she would take her doses via teach back method.  MVI and famotidine to be included in blisterpack if possible.    2.  Spoke with representative from Dr. Elna Breslow office.  Refill called in to [facility] for triamterene-HCTZ and SL NTG.    3.  Route note to providers.  Requested prescription for famotidine to be called in by patient's GI provider so it will not be overlooked in the blister-pack.  Famotidine is Tier 2 which should be $8 co-pay.   4.  Follow-up with patient on Friday to see how she is doing with her blister-pack.   5.  Make referrals to [facility] and [facility] per patient request.   Ralene Bathe, PharmD, [place] [phone]

## 2017-04-22 ENCOUNTER — Telehealth: Payer: Self-pay

## 2017-04-22 ENCOUNTER — Encounter: Payer: Self-pay | Admitting: Pharmacist

## 2017-04-22 MED ORDER — FAMOTIDINE 10 MG PO TABS: 10.0000 mg | ORAL_TABLET | Freq: Every day | ORAL | 5 refills | Status: DC

## 2017-04-22 NOTE — Telephone Encounter (Signed)
Colleen from [facility] called to see if we could call in Pepcid 10 mg daily for the patient so this could be added to her bubble pack. She has been buying it over the counter.

## 2017-04-22 NOTE — Telephone Encounter (Signed)
Colleen is aware.

## 2017-04-22 NOTE — Telephone Encounter (Signed)
Make sure her omeprazole is being given in the am before breakfast.  Take pepcid 10mg  daily before evening meal. RX sent.

## 2017-04-23 ENCOUNTER — Other Ambulatory Visit: Payer: Self-pay | Admitting: *Deleted

## 2017-04-23 ENCOUNTER — Ambulatory Visit: Payer: Self-pay | Admitting: Pharmacist

## 2017-04-23 ENCOUNTER — Telehealth: Payer: Self-pay | Admitting: Pharmacist

## 2017-04-23 NOTE — Patient Outreach (Signed)
[facility] [facility]) Care Management  04/23/2017  Candace Cervantes Jul 10, 1925 [id number]   Successful telephone outreach call to patient. HIPAA identifiers verified. Patient received blister-packed medications yesterday from [name].  She states that the delivery person from [facility] reviewed the blister-pack with her and that her daughter came over yesterday afternoon and reviewed it with her as well.  She states she understands how to use it.  Her PRN medications remain separated in pill bottles on her kitchen table.    Patient complained of having head pain occasionally as well as leg pain and rashes/bumps on her skin.  I recommended that she speak with her family doctor / PCP regarding these issues.    Plan: Route note to PCP.  Call patient next week to follow-up on medication adherence.   Ralene Bathe, PharmD, [place] [phone]

## 2017-04-23 NOTE — Patient Outreach (Signed)
Successful telephone outreach to pt, follow up on referral received 04/22/17 from [facility] pharmacist who did a home visit with pt 04/22/2017- pt requested conversation with RN CM after [facility] services were reviewed with her.  Spoke with pt, HIPAA identifiers provided, discussed purpose of call- follow up on referral.   Pt recognized RN CM as  spouse (deceased) received [facility] CM services from this RN CM.  Pt reports [facility] pharmacist came yesterday, went over her medications, getting home delivery for medications now, taking all medications as ordered. 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  Pt reports she did request conversation with RN CM and agreed today to have RN CM do a home visit next week to assess needs.    Plan:  As discussed with pt, plan to follow up next week- initial home visit.   [name].   [place] Care Management  [phone]

## 2017-04-26 ENCOUNTER — Other Ambulatory Visit: Payer: Self-pay | Admitting: *Deleted

## 2017-04-26 ENCOUNTER — Telehealth: Payer: Self-pay | Admitting: Family Medicine

## 2017-04-26 ENCOUNTER — Other Ambulatory Visit: Payer: Self-pay | Admitting: Family Medicine

## 2017-04-26 ENCOUNTER — Ambulatory Visit: Payer: Self-pay | Admitting: *Deleted

## 2017-04-26 NOTE — Patient Outreach (Signed)
[place] [facility]) Care Management  04/26/2017  LATRESA GASSER 05/23/1925 [id number]  Care Coordination  RN CM [place]) spoke with Magda Bernheim [facility] RN). Rose will be following-up with patient. Initial home visit is scheduled for the week of 78/23/18.  Lake Bells, RN, BSN, MHA/MSL, [place] Telephonic Care Manager Coordinator [facility] Direct Phone: [phone] Toll Free: [phone] Fax: [phone]

## 2017-04-26 NOTE — Telephone Encounter (Signed)
Pls write a d/c note with patient's name and DOB to [employer]. State discontinue meclizine and robaxin effective 04/27/2017, leave in my box, I will sign , then you fax to [place] please, thanks  ??Pls ask  Thanks

## 2017-04-26 NOTE — Patient Outreach (Signed)
[facility]) Care Management  04/26/2017  Candace Cervantes April 13, 1925 [id number]   Referral received from [place] stating that patient wanted to have a conversation with this CSW.  Per patient, her spouse passed away on 11-30-2016. She was his primary caregiver.Per patient, she now feels a little lonesome but has her daughter who comes over regularly. Per patient, she has hired someone to take care of the lawn,  Per patient, she feels she is not herself, and often feels the spirits of her husband in her home. Patient's  daughter would like  her to move in with her as well as another friend of hers as offered that patient move in with her, however patient is adamant that she wants to remain in her own home. Referral to grief counseling discussed, however patient not interested. Patient would like a home visit.  Plan: Home visit scheduled for 04/28/17 at 10:00am.   [name] [facility] Care Management [phone]

## 2017-04-27 ENCOUNTER — Encounter: Payer: Self-pay | Admitting: Family Medicine

## 2017-04-27 ENCOUNTER — Encounter: Payer: Self-pay | Admitting: *Deleted

## 2017-04-27 ENCOUNTER — Other Ambulatory Visit: Payer: Self-pay | Admitting: *Deleted

## 2017-04-27 NOTE — Telephone Encounter (Signed)
Done

## 2017-04-27 NOTE — Patient Outreach (Signed)
Error entry.  See 04/27/17 initial home visit note.   [name].   [place] Care Management  [phone]

## 2017-04-27 NOTE — Patient Outreach (Signed)
[facility]) Care Management   04/27/2017  Candace Cervantes April 27, 1925 [id number]  Candace Cervantes is an 83 y.o. female  Subjective: Pt reports she is getting more rest since spouse passed, okay living by herself, Daughter lives close by, either daughter or granddaughter call every day to check on her.  Pt reports daughter wanted her to stay with her but she is gone most of the time.  Pt reports  She almost fell several times, uses a cane.  Pt reports her niece set up Call alert system but  Not working.   Pt reports saw Heart MD within last 2 weeks, good report.  Pt reports no pain  Today but does deal with pain in back of head,coming from her nervous system.  Pt reports  can't see well/need to follow up with  an Eye MD.   Objective:   Vitals:   04/27/17 1511  BP: 130/70  Pulse: 67  Resp: 16    ROS  Physical Exam  Constitutional: She is oriented to person, place, and time. She appears well-developed and well-nourished.  Cardiovascular: Normal rate and regular rhythm.   Respiratory: Effort normal and breath sounds normal.  GI: Soft. Bowel sounds are normal.  Musculoskeletal: Normal range of motion. She exhibits no edema.  Neurological: She is alert and oriented to person, place, and time.  Skin: Skin is warm and dry.  Psychiatric: She has a normal mood and affect. Her behavior is normal. Judgment and thought content normal.    Encounter Medications:   Outpatient Encounter Prescriptions as of 04/27/2017  Medication Sig Note  . ACETAMINOPHEN EXTRA STRENGTH PO Take 650 mg by mouth daily as needed (pain). 04/27/2017: As needed.   . busPIRone (BUSPAR) 5 MG tablet TAKE 1 TABLET(5 MG) BY MOUTH TWICE DAILY   . citalopram (CELEXA) 10 MG tablet TAKE 1 TABLET BY MOUTH DAILY   . famotidine (PEPCID) 10 MG tablet Take 1 tablet (10 mg total) by mouth daily before supper.   . Multiple Vitamin (MULTIVITAMIN WITH MINERALS) TABS tablet Take 1 tablet by mouth daily.   .  nitroGLYCERIN (NITROSTAT) 0.4 MG SL tablet Place 1 tablet (0.4 mg total) under the tongue every 5 (five) minutes as needed for chest pain. One tablet under the tongue at onset of chest pains, repeat every five minutes as needed 04/27/2017: Have on hand to use as needed.   Marland Kitchen omeprazole (PRILOSEC) 20 MG capsule TAKE 1 CAPSULE BY MOUTH DAILY   . potassium chloride SA (K-DUR,KLOR-CON) 20 MEQ tablet Take 2 tablets (40 mEq total) by mouth daily. 04/27/2017: Pt taking one tablet daily   . pravastatin (PRAVACHOL) 40 MG tablet Take 1 tablet (40 mg total) by mouth daily.   Marland Kitchen triamterene-hydrochlorothiazide (MAXZIDE-25) 37.5-25 MG tablet Take 1 tablet by mouth daily.    No facility-administered encounter medications on file as of 04/27/2017.     Functional Status:   In your present state of health, do you have any difficulty performing the following activities: 04/27/2017 02/20/2017  Hearing? Y N  Vision? Y N  Difficulty concentrating or making decisions? Y N  Walking or climbing stairs? Y N  Dressing or bathing? N N  Doing errands, shopping? Y N  Preparing Food and eating ? N -  Using the Toilet? N -  Do you have problems with loss of bowel control? N -  Managing your Medications? N -  Managing your Finances? Y -  Housekeeping or managing your Housekeeping? N -  Some  recent data might be hidden    Fall/Depression Screening:    Fall Risk  04/27/2017 03/03/2017 11/05/2016  Falls in the past year? Yes No No  Number falls in past yr: 1 - -  Injury with Fall? No - -  Risk for fall due to : - - -  Follow up Falls prevention discussed - -   PHQ 2/9 Scores 04/27/2017 03/03/2017 11/05/2016 06/02/2016 04/17/2015 02/08/2014 07/26/2013  PHQ - 2 Score 1 0 0 2 2 2 1   PHQ- 9 Score - - - 5 7 4  -    Assessment:  Pleasant 83 year old female, lives alone, daughter close by.     Lungs clear, no complaints of pain,sob, edema. Hypertension:  BP today 130/70. View in [place] recent MD visits BP 160/80,164/76,155/72.      Discussed with pt monitoring salt intake (Meals on Wheels when taste salty), ongoing     Medication adherence and exercise.  Hydration: per pt does not drink enough water- encouraged to increase especially with hot/     Humid weather.   [facility] CM Care Plan Problem One     Most Recent Value  Care Plan Problem One  Hx of Hypertension   Role Documenting the Problem One  Care Management Coordinator  Care Plan for Problem One  Active  THN Long Term Goal   improvement of self management of Hypertension seen within the next 40 days   THN Long Term Goal Start Date  04/27/17  Interventions for Problem One Long Term Goal  Utilized teach back method with pt ongoing medication adherence, exercise, monitoring salt intake   [facility] CM Short Term Goal #1   Pt would monitor salt intake more closely for the next 30 days   [facility] CM Short Term Goal #1 Start Date  04/27/17  Interventions for Short Term Goal #1  Discussed with pt substituing/limiting portions of Meals on Wheels when taste salty, also suggested switching to Mrs. Dash     [facility] CM Care Plan Problem Two     Most Recent Value  Care Plan Problem Two  Fall risk - per pt almost fell several times   Role Documenting the Problem Two  Care Management Coordinator  Care Plan for Problem Two  Active  [facility]N CM Short Term Goal #1   Pt would have no falls within the next 30 days   [facility]N CM Short Term Goal #1 Start Date  04/27/17  Interventions for Short Term Goal #2   Discussed with pt ongoing use of cane, have Call Alert system set up, call Eye MD for an appointment       Plan:  As discussed with pt, plan to continue to follow short term with community CM services,             Follow up again next month- home visit.           As discussed with pt, need to  make an appointment with Eye MD           Plan to send Candace Cervantes by in basket 04/27/17 home visit encounter.   Candace Cervantes.   [place] Care Management  [phone]

## 2017-04-28 ENCOUNTER — Other Ambulatory Visit: Payer: Self-pay | Admitting: *Deleted

## 2017-04-28 ENCOUNTER — Other Ambulatory Visit: Payer: Self-pay | Admitting: Pharmacist

## 2017-04-28 ENCOUNTER — Ambulatory Visit: Payer: Self-pay | Admitting: Pharmacist

## 2017-04-28 NOTE — Patient Outreach (Signed)
[facility]) Care Management  04/28/2017  Candace Cervantes July 30, 1925 [id number]  Successful outreach telephone call to Candace Cervantes.  HIPAA identifiers verified.   Per Dr. Moshe Cipro, meclizine and methocarbamol have been discontinued.  Patient did not know she was supposed to stop these medications.  Updated patient.  Patient verbalized understanding and stated she will not take these medications anymore. Reviewed with patient that she can put loose tablets in her coffee grounds to discard and to mark off her name on her prescription labels on the empty bottles.    Patient states she is doing well with the blister-pack of medications.  She thinks she has missed two bedtime doses in the last week but has taken all her morning doses.  We reviewed that she can take her bedtime doses earlier in the evening around supper if this will help improve her memory and adherence.    Patient still complaining of head pain and skin rashes/bumps.  She has not made an appointment with Dr. Moshe Cipro yet.  I gave her Dr. Griffin Dakin office number and patient stated she will call for appointment today.    Plan: Follow-up with patient next week regarding blister-packed medications and adherence.    Ralene Bathe, PharmD, [place] [phone]

## 2017-04-29 ENCOUNTER — Encounter: Payer: Self-pay | Admitting: *Deleted

## 2017-04-29 NOTE — Patient Outreach (Signed)
[facility]) Care Management  04/29/2017  Candace Cervantes [birth year] [id number]   Subjective: This Education officer, museum saw patient in patient's home. She is a 83 year old female referred to this social worker for mild confusion and assistance with her advanced directive. Patient states that her spouse died in 10-Dec-2016 and she is now living in the home alone. She has two daughters, one living only  Few miles away, however she is adamant that she does not want to live with her. "she is gone all of the time, I would still be by myself". Patient's son has purchased her a med alert necklace for emergencies through [employer].  Patient openly discussed the events leading up to her husbands death. Briefly discussed financial conflicts with her son. Per patient, she has denied him additional money. She states that there is a possibility that he can become aggressive with her. Safety precautions discussed.   Objective:  Current Outpatient Prescriptions on File Prior to Visit  Medication Sig Dispense Refill  . ACETAMINOPHEN EXTRA STRENGTH PO Take 650 mg by mouth daily as needed (pain).    . busPIRone (BUSPAR) 5 MG tablet TAKE 1 TABLET(5 MG) BY MOUTH TWICE DAILY 180 tablet 1  . citalopram (CELEXA) 10 MG tablet TAKE 1 TABLET BY MOUTH DAILY 90 tablet 1  . famotidine (PEPCID) 10 MG tablet Take 1 tablet (10 mg total) by mouth daily before supper. 30 tablet 5  . Multiple Vitamin (MULTIVITAMIN WITH MINERALS) TABS tablet Take 1 tablet by mouth daily.    . nitroGLYCERIN (NITROSTAT) 0.4 MG SL tablet Place 1 tablet (0.4 mg total) under the tongue every 5 (five) minutes as needed for chest pain. One tablet under the tongue at onset of chest pains, repeat every five minutes as needed 25 tablet 3  . omeprazole (PRILOSEC) 20 MG capsule TAKE 1 CAPSULE BY MOUTH DAILY 90 capsule 1  . potassium chloride SA (K-DUR,KLOR-CON) 20 MEQ tablet Take 2 tablets (40 mEq total) by mouth daily. 60 tablet 0  .  pravastatin (PRAVACHOL) 40 MG tablet Take 1 tablet (40 mg total) by mouth daily. 30 tablet 5  . triamterene-hydrochlorothiazide (MAXZIDE-25) 37.5-25 MG tablet Take 1 tablet by mouth daily. 90 tablet 3   No current facility-administered medications on file prior to visit.     Depression screen [facility] 2/9 04/27/2017 03/03/2017 11/05/2016 06/02/2016 04/17/2015  Decreased Interest 0 0 0 1 1  Down, Depressed, Hopeless 1 0 0 1 1  PHQ - 2 Score 1 0 0 2 2  Altered sleeping - - - 0 1  Tired, decreased energy - - - 2 1  Change in appetite - - - 0 0  Feeling bad or failure about yourself  - - - 1 1  Trouble concentrating - - - 0 1  Moving slowly or fidgety/restless - - - 0 1  Suicidal thoughts - - - 0 0  PHQ-9 Score - - - 5 7  Difficult doing work/chores - - - Somewhat difficult Not difficult at all  Some recent data might be hidden     Assessment: Patient now living on her own following the death of her spouse. Patient does not want to live with her daughter, however her daughter is supportive and visits regularly. She lives less that 3 miles away.  Safety precautions discussed related to patient's step-son and possible aggression related to finances.  Patient to wear her med alert at all times and keep it charged up. Patient's medications reviewed,  it was found that she had skipped several days of her evening and bedtime pill packs and did not have an explanation.  This Education officer, museum reinforced the importance of taking her medication as ordered. Patient's advanced directive discussed. Per patient, she believes she has completed this document but does not know where it is. She plans to locate it for the next home visit.  Plan: This Education officer, museum will follow up with patient within 1 month regarding her advanced directive.           This Education officer, museum will inform RNCM and [facility] Pharmacist of patient's medication adherence.    [name] [facility] Care Management [phone]

## 2017-05-05 ENCOUNTER — Ambulatory Visit: Payer: Self-pay | Admitting: Pharmacist

## 2017-05-05 ENCOUNTER — Other Ambulatory Visit: Payer: Self-pay | Admitting: Pharmacist

## 2017-05-05 NOTE — Patient Outreach (Signed)
[facility]) Care Management  05/05/2017  Candace Cervantes 02-22-1925 [id number]  Unsuccessful outreach attempt to patient today.  No voicemail came on to leave message.   Plan: Follow-up with patient tomorrow regarding her medications and blister-pack adherence  Ralene Bathe, PharmD, [place] [phone]    s

## 2017-05-06 ENCOUNTER — Ambulatory Visit: Payer: Self-pay | Admitting: Pharmacist

## 2017-05-06 ENCOUNTER — Other Ambulatory Visit: Payer: Self-pay | Admitting: Pharmacist

## 2017-05-06 NOTE — Patient Outreach (Signed)
[facility]) Care Management  05/06/2017  Candace Cervantes [birth year] [id number]  Successful outreach call to Candace Cervantes today.  HIPAA identifiers verified.  Patient states she is doing "very good."  She is tired from shucking corn but otherwise no complaints.  She states that she doing well with the bubble-packed medications.  She has about 2 weeks left of medications. Patient thinks that she occasionally forgets to take her bedtime dose of medications.  I recommended to Candace Cervantes that she can take her bedtime medications at supper if this will help her to remember since she keeps her medications in the kitchen.  Patient confirmed she has an appointment with Dr. Tamala Julian next Tuesday, May 11, 2017 at 9:00AM.  I suggested that she bring her bubble-pack card of medications with her to appointment so Dr. Tamala Julian can see how she is doing with adherence.  Patient stated she understood and would do this.    Plan: Follow-up with patient in another 2 weeks regarding her medication adherence.    Ralene Bathe, PharmD, [place] [phone]

## 2017-05-11 ENCOUNTER — Encounter: Payer: Self-pay | Admitting: Family Medicine

## 2017-05-11 ENCOUNTER — Ambulatory Visit (INDEPENDENT_AMBULATORY_CARE_PROVIDER_SITE_OTHER): Payer: Medicare Other | Admitting: Family Medicine

## 2017-05-11 VITALS — BP 118/74 | HR 68 | Resp 16 | Ht 70.0 in | Wt 160.0 lb

## 2017-05-11 DIAGNOSIS — E785 Hyperlipidemia, unspecified: Secondary | ICD-10-CM | POA: Diagnosis not present

## 2017-05-11 DIAGNOSIS — K219 Gastro-esophageal reflux disease without esophagitis: Secondary | ICD-10-CM | POA: Diagnosis not present

## 2017-05-11 DIAGNOSIS — E559 Vitamin D deficiency, unspecified: Secondary | ICD-10-CM | POA: Diagnosis not present

## 2017-05-11 DIAGNOSIS — I10 Essential (primary) hypertension: Secondary | ICD-10-CM | POA: Diagnosis not present

## 2017-05-11 DIAGNOSIS — F411 Generalized anxiety disorder: Secondary | ICD-10-CM

## 2017-05-11 DIAGNOSIS — D638 Anemia in other chronic diseases classified elsewhere: Secondary | ICD-10-CM | POA: Diagnosis not present

## 2017-05-11 DIAGNOSIS — R51 Headache: Secondary | ICD-10-CM | POA: Diagnosis not present

## 2017-05-11 DIAGNOSIS — Z1211 Encounter for screening for malignant neoplasm of colon: Secondary | ICD-10-CM

## 2017-05-11 DIAGNOSIS — H547 Unspecified visual loss: Secondary | ICD-10-CM | POA: Diagnosis not present

## 2017-05-11 DIAGNOSIS — R519 Headache, unspecified: Secondary | ICD-10-CM

## 2017-05-11 LAB — BASIC METABOLIC PANEL
BUN: 20 mg/dL (ref 7–25)
CALCIUM: 9.7 mg/dL (ref 8.6–10.4)
CO2: 28 mmol/L (ref 20–32)
Chloride: 102 mmol/L (ref 98–110)
Creat: 1.06 mg/dL — ABNORMAL HIGH (ref 0.60–0.88)
GLUCOSE: 92 mg/dL (ref 65–99)
Potassium: 4.1 mmol/L (ref 3.5–5.3)
SODIUM: 139 mmol/L (ref 135–146)

## 2017-05-11 LAB — CBC
HCT: 34.6 % — ABNORMAL LOW (ref 35.0–45.0)
HEMOGLOBIN: 11.6 g/dL — AB (ref 11.7–15.5)
MCH: 31 pg (ref 27.0–33.0)
MCHC: 33.5 g/dL (ref 32.0–36.0)
MCV: 92.5 fL (ref 80.0–100.0)
MPV: 9.6 fL (ref 7.5–12.5)
PLATELETS: 192 10*3/uL (ref 140–400)
RBC: 3.74 MIL/uL — ABNORMAL LOW (ref 3.80–5.10)
RDW: 14.1 % (ref 11.0–15.0)
WBC: 4 10*3/uL (ref 3.8–10.8)

## 2017-05-11 LAB — POC HEMOCCULT BLD/STL (OFFICE/1-CARD/DIAGNOSTIC): FECAL OCCULT BLD: NEGATIVE

## 2017-05-11 NOTE — Progress Notes (Signed)
   Candace Cervantes     MRN: [id number]      DOB: 1925-01-03   HPI Candace Cervantes is here for follow up and re-evaluation of chronic medical conditions, medication management and review of any available recent lab and radiology data.  Preventive health is updated, specifically  Cancer screening and Immunization.   Questions or concerns regarding consultations or procedures which the PT has had in the interim are  addressed. The PT denies any adverse reactions to current medications since the last visit.  C/o intermittent headaches, primarily from posterior neck, also states her vision gets blurry intermittently , needs eye exam States managing on her own at home well, despite loss of spouse, her children and grandchildren check on her, live nearby and se has a fall alert necklace   ROS Denies recent fever or chills. Denies sinus pressure, nasal congestion, ear pain or sore throat. Denies chest congestion, productive cough or wheezing. Denies chest pains, palpitations and leg swelling Denies abdominal pain, nausea, vomiting,diarrhea or constipation.   Denies dysuria, frequency, hesitancy or incontinence. Denies uncontrolled  joint pain, swelling and limitation in mobility. Denies headaches, seizures, numbness, or tingling. Denies depression, anxiety or insomnia. Denies skin break down or rash.   PE  BP 118/74   Pulse 68   Resp 16   Ht 5\' 10"  (1.778 m)   Wt 160 lb (72.6 kg)   SpO2 98%   BMI 22.96 kg/m   Patient alert and oriented and in no cardiopulmonary distress.  HEENT: No facial asymmetry, EOMI,   oropharynx pink and moist.  Neck supple no JVD, no mass.  Chest: Clear to auscultation bilaterally.  CVS: S1, S2 no murmurs, no S3.Regular rate.  ABD: Soft non tender.  Rectal; no mass, heme negative stool  Ext: No edema  MS: decreased  ROM spine, shoulders, hips and knees.  Skin: Intact, no ulcerations or rash noted.  Psych: Good eye contact, normal affect. Memory mildly  impaired not anxious or depressed appearing.  CNS: CN 2-12 intact, power,  normal throughout.no focal deficits noted.   Assessment & Plan Loss of vision C/o bilateral blurry vision, wears glasses, refer for eye exam  Essential hypertension Controlled, no change in medication   GAD (generalized anxiety disorder) Controlled, no change in medication   Hyperlipemia Hyperlipidemia:Low fat diet discussed and encouraged.   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

## 2017-05-11 NOTE — Patient Instructions (Addendum)
Pls change 10/3 visit to wellness with nurse if there is available slot.  MD follow up in early January  You are referred to Dr Gershon Crane for eye exam  Rectal exam shows no blood in stool  CBC, chem 7 and vit d today please  Happy 90, many more, be careful not to fall, and continue to enjoy your family!  Thank you  for choosing [facility]. We consider it a privelige to serve you.  Delivering excellent health care in a caring and  compassionate way is our goal.  Partnering with you,  so that together we can achieve this goal is our strategy.

## 2017-05-11 NOTE — Assessment & Plan Note (Signed)
C/o bilateral blurry vision, wears glasses, refer for eye exam

## 2017-05-12 LAB — VITAMIN D 25 HYDROXY (VIT D DEFICIENCY, FRACTURES): Vit D, 25-Hydroxy: 31 ng/mL (ref 30–100)

## 2017-05-14 ENCOUNTER — Encounter: Payer: Self-pay | Admitting: Family Medicine

## 2017-05-15 NOTE — Assessment & Plan Note (Signed)
Hyperlipidemia:Low fat diet discussed and encouraged.   Lipid Panel  Lab Results  Component Value Date   CHOL 190 03/03/2017   HDL 96 03/03/2017   LDLCALC 77 03/03/2017   TRIG 85 03/03/2017   CHOLHDL 2.0 03/03/2017   Controlled, no change in medication

## 2017-05-15 NOTE — Assessment & Plan Note (Signed)
Controlled, no change in medication  

## 2017-05-15 NOTE — Assessment & Plan Note (Signed)
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

## 2017-05-20 ENCOUNTER — Ambulatory Visit: Payer: Self-pay | Admitting: Pharmacist

## 2017-05-21 ENCOUNTER — Ambulatory Visit: Payer: Self-pay | Admitting: Pharmacist

## 2017-05-21 ENCOUNTER — Other Ambulatory Visit: Payer: Self-pay | Admitting: Pharmacist

## 2017-05-21 NOTE — Patient Outreach (Signed)
[facility]) Care Management  05/21/2017  Candace Cervantes 12/09/1924 [id number]   Successful telephone encounter with patient today.  HIPAA identifiers verified. Patient reports she is doing very well with the medication bubble-pack and had a recent delivery this week with another 4 week supply.  She estimates that she misses her evening medications (buspar, famotidine) 2-3x a week but always takes her morning medications.  Ms. Shimada reports that she is trying to remember to take her evening medications near supper and that she feels she is improving with her adherence.  She does not want to use a medication alarm system.    Per review of recent office visit with Dr. Moshe Cipro, noted potassium discontinued.  Per [employer], potassium still active medication on patient's profile and was included in her next month's medication supply.  I left a message with Dr. Griffin Dakin office to clarify potassium with patient's pharmacy.  No other medication changes noted.    I offered to visit patient at her home to review her medication bubble-pack but patient declined.  She is happy with her medications and denies any further concerns or questions.    [facility] pharmacy will close patient's case at this time but am happy to assist in the future as needed.   Plan: I will route my note to Dr. Griffin Dakin office and alert [facility] RN Kathie Rhodes and [place] of pharmacy case closure.   Ralene Bathe, PharmD, [place] [phone]

## 2017-05-24 ENCOUNTER — Telehealth: Payer: Self-pay

## 2017-05-24 NOTE — Telephone Encounter (Signed)
no

## 2017-05-24 NOTE — Telephone Encounter (Signed)
Should this pt be taking potassium?

## 2017-05-28 ENCOUNTER — Other Ambulatory Visit: Payer: Self-pay | Admitting: *Deleted

## 2017-05-28 ENCOUNTER — Encounter: Payer: Self-pay | Admitting: *Deleted

## 2017-05-28 NOTE — Patient Outreach (Signed)
[employer] [facility]) Care Management  [facility] Social Work  05/28/2017  RUWAYDA CURET 1925/07/20 [id number]  Subjective:   Patient is a 83 year old female. Per patient, she has not heard from her step-son Brooke Bonito. and is no longer feeling threatened by him. Patient still has her medical alert system for emergencies, she was reminded to keep on charger overnight.  Patient continues to have trouble remembering to take her evening and bed time medications. Per patient, she plans to lay them on her night stand so that she will remember to take them before bed.. Patient continues to report  that she cannot locate her Living Will/ Advanced Directive. Patient asked for an additional copy to review. Patient stated that she has small bumps on her legs and arms, she is not sure what they are. Per patient, she discussed this with one of her doctors, who suggested that she speak with her primary care physician. Per patient, she has been using Bengay and lubricant which helps the itching. Objective:   Encounter Medications:  Outpatient Encounter Prescriptions as of 05/28/2017  Medication Sig Note  . ACETAMINOPHEN EXTRA STRENGTH PO Take 650 mg by mouth daily as needed (pain). 04/27/2017: As needed.   . busPIRone (BUSPAR) 5 MG tablet TAKE 1 TABLET(5 MG) BY MOUTH TWICE DAILY   . citalopram (CELEXA) 10 MG tablet TAKE 1 TABLET BY MOUTH DAILY   . famotidine (PEPCID) 10 MG tablet Take 1 tablet (10 mg total) by mouth daily before supper.   . Multiple Vitamin (MULTIVITAMIN WITH MINERALS) TABS tablet Take 1 tablet by mouth daily.   . nitroGLYCERIN (NITROSTAT) 0.4 MG SL tablet Place 1 tablet (0.4 mg total) under the tongue every 5 (five) minutes as needed for chest pain. One tablet under the tongue at onset of chest pains, repeat every five minutes as needed 04/27/2017: Have on hand to use as needed.   Marland Kitchen omeprazole (PRILOSEC) 20 MG capsule TAKE 1 CAPSULE BY MOUTH DAILY   . pravastatin (PRAVACHOL) 40 MG tablet Take 1  tablet (40 mg total) by mouth daily.   Marland Kitchen triamterene-hydrochlorothiazide (MAXZIDE-25) 37.5-25 MG tablet Take 1 tablet by mouth daily.    No facility-administered encounter medications on file as of 05/28/2017.     Functional Status:  In your present state of health, do you have any difficulty performing the following activities: 04/27/2017 02/20/2017  Hearing? Y N  Vision? Y N  Difficulty concentrating or making decisions? Y N  Comment little, some times daughter does finances  -  Walking or climbing stairs? Y N  Dressing or bathing? N N  Doing errands, shopping? Y N  Preparing Food and eating ? N -  Using the Toilet? N -  Do you have problems with loss of bowel control? N -  Managing your Medications? N -  Managing your Finances? Y -  Housekeeping or managing your Housekeeping? N -  Some recent data might be hidden    Fall/Depression Screening:  PHQ 2/9 Scores 04/27/2017 03/03/2017 11/05/2016 06/02/2016 04/17/2015 02/08/2014 07/26/2013  PHQ - 2 Score 1 0 0 2 2 2 1   PHQ- 9 Score - - - 5 7 4  -    Assessment: Patient very friendly and engaging, discussed her frustration with her husbands family and their request for  financial help. She continues to practice firm boundaries when the requests are made. Options explored to assist with remembering to taker her evening and bedtime medications. Medication adherence reinforced. Per patient, she will lay them on  her nightstand as a reminder. Spoke with patient's daughter Roselyn about patient's Scientist, physiological. Document  reviewed and discussed with patient , however patient would like her daughter Roselyn to review and assist with it's completion. This Education officer, museum spoke with patient's daughter about patient's  Advanced Directive and she agrees to assist patient with it's completion.  Plan: This Education officer, museum to discuss medication adherence with [facility] RNCM  This Education officer, museum to follow up with patient within 1 month regarding completion of her  Advanced Directive.   [name] [facility] Care Management [phone]

## 2017-05-28 NOTE — Patient Outreach (Signed)
[facility]) Care Management   05/28/2017  Candace Cervantes 04-23-1925 [id number]  Candace Cervantes is an 83 y.o. female  Subjective:  Pt reports been dealing with spots on legs/itching, forgot to tell PCP at last  Visit, been using Bengay and vaseline - helps.   Pt reports been having dark stools, informed  MD during office visit/checked her out, no bleeding seen.  Pt reports did go to  [facility]  Referred by PCP but left because needed a ride home for dilated eyes.  Pt reports does not  Understand why PCP referred her to [facility] when she was having headaches.  RN CM  Discussed with pt view of PCP's recent office visit in [place]- did report to MD having blurred Vision.  Pt reports she continues to have blurred vision, eyesight not as good as it was and  Headaches are better.   Objective:   Vitals:   05/28/17 1327 05/28/17 1404  BP: (!) 144/82 132/82  Pulse: 60   Resp: 20   SpO2: 98%     ROS  Physical Exam  Constitutional: She is oriented to person, place, and time. She appears well-developed and well-nourished.  Cardiovascular: Normal rate, regular rhythm and normal heart sounds.   Respiratory: Effort normal and breath sounds normal.  GI: Soft.  Musculoskeletal: Normal range of motion. She exhibits no edema.  Neurological: She is alert and oriented to person, place, and time.  Skin: Skin is warm and dry.  Psychiatric: She has a normal mood and affect. Her behavior is normal. Judgment and thought content normal.    Encounter Medications:   Outpatient Encounter Prescriptions as of 05/28/2017  Medication Sig Note  . ACETAMINOPHEN EXTRA STRENGTH PO Take 650 mg by mouth daily as needed (pain). 04/27/2017: As needed.   . busPIRone (BUSPAR) 5 MG tablet TAKE 1 TABLET(5 MG) BY MOUTH TWICE DAILY   . citalopram (CELEXA) 10 MG tablet TAKE 1 TABLET BY MOUTH DAILY   . famotidine (PEPCID) 10 MG tablet Take 1 tablet (10 mg total) by mouth daily before supper.   . Multiple Vitamin  (MULTIVITAMIN WITH MINERALS) TABS tablet Take 1 tablet by mouth daily.   . nitroGLYCERIN (NITROSTAT) 0.4 MG SL tablet Place 1 tablet (0.4 mg total) under the tongue every 5 (five) minutes as needed for chest pain. One tablet under the tongue at onset of chest pains, repeat every five minutes as needed 04/27/2017: Have on hand to use as needed.   Marland Kitchen omeprazole (PRILOSEC) 20 MG capsule TAKE 1 CAPSULE BY MOUTH DAILY   . pravastatin (PRAVACHOL) 40 MG tablet Take 1 tablet (40 mg total) by mouth daily.   Marland Kitchen triamterene-hydrochlorothiazide (MAXZIDE-25) 37.5-25 MG tablet Take 1 tablet by mouth daily.    No facility-administered encounter medications on file as of 05/28/2017.     Functional Status:   In your present state of health, do you have any difficulty performing the following activities: 04/27/2017 02/20/2017  Hearing? Y N  Vision? Y N  Difficulty concentrating or making decisions? Y N  Comment little, some times daughter does finances  -  Walking or climbing stairs? Y N  Dressing or bathing? N N  Doing errands, shopping? Y N  Preparing Food and eating ? N -  Using the Toilet? N -  Do you have problems with loss of bowel control? N -  Managing your Medications? N -  Managing your Finances? Y -  Housekeeping or managing your Housekeeping? N -  Some recent data might be hidden    Fall/Depression Screening:    Fall Risk  04/27/2017 03/03/2017 11/05/2016  Falls in the past year? Yes No No  Number falls in past yr: 1 - -  Injury with Fall? No - -  Risk for fall due to : - - -  Follow up Falls prevention discussed - -   PHQ 2/9 Scores 04/27/2017 03/03/2017 11/05/2016 06/02/2016 04/17/2015 02/08/2014 07/26/2013  PHQ - 2 Score 1 0 0 _0 PHQ- 9 Score - - - _1 -    Assessment:  Pleasant 83 year old female, lives alone, daughter Candace Cervantes close by/calls pt daily.   Daughter Candace Cervantes arrived during home visit to which RN CM discussed with pt and daughter need  To  schedule another appointment with  Eye MD to which both agreed/daughter to provide transportation  Home.      Hypertension: BP today 144/82 Right arm (medication taken approximately 4 hours earlier), 132/82 left arm.        Pt did report had something salty today.     Medications: pt provided bubble packs for review along with missed medicatio days (8/1,8/2 Simvastatin,        Buspar; 8/14- Pepcid).   Reinforced with pt importance of medication adherence, daughter- when         Calling pt give medication reminders.       Plan:   As discussed with pt, plan to discharge from [facility] services- goals met.             To inform Chrystal [facility] LCSW of discharge.              Plan to inform Dr Moshe Cipro of discharge.               [facility] CM Care Plan Problem One     Most Recent Value  Care Plan Problem One  Hx of Hypertension   Role Documenting the Problem One  Care Management Coordinator  Care Plan for Problem One  Active  THN Long Term Goal   Improvement of self management of Hypertension seen within the next 40 days   [facility]N Long Term Goal Start Date  04/27/17  [facility] Long Term Goal Met Date  05/28/17  Interventions for Problem One Long Term Goal  Reinforced with pt ongoing adherence with medications.   [facility] CM Short Term Goal #1   Pt would monitor salt intake more closely for the next 30 days   [facility] CM Short Term Goal #1 Start Date  04/27/17  [facility] CM Short Term Goal #1 Met Date  05/28/17    [facility] CM Care Plan Problem Two     Most Recent Value  Care Plan Problem Two  Fall fisk- per pt almost fell several times.   Role Documenting the Problem Two  Care Management [place] for Problem Two  Active  [facility] CM Short Term Goal #1   P would have no falls in the next 30 days   [facility] CM Short Term Goal #1 Start Date  04/27/17  [facility] CM Short Term Goal #1 Met Date   05/28/17  Interventions for Short Term Goal #2   Reinforced with pt use of  cane outdoors/as needed indoors.      [name].   [facility] Care Management   [phone]

## 2017-06-10 ENCOUNTER — Telehealth: Payer: Self-pay | Admitting: Family Medicine

## 2017-06-10 ENCOUNTER — Other Ambulatory Visit: Payer: Self-pay | Admitting: Licensed Clinical Social Worker

## 2017-06-10 NOTE — Patient Outreach (Signed)
Assessment:  CSW Candace Cervantes completed chart review on client. Client had been receiving CSW services with [employer]. Candace Land LCSW had spoken with client and daughter of client about Advanced Directives completion for client. Candace Cervantes had provided client with Advanced Directives booklet. Daughter of client had said she would help client review and complete needed Advanced Directives documents for client. Client had previously requested that her daughter help client with completion of needed Advanced Directives documents. CSW spoke via phone with client. CSW verified client identity. CSW received verbal permission from client on 06/10/17 for CSW to speak with client about current client needs and status. CSW spoke with client about [facility] document and completion of document. CSW spoke with client about Living Will document and completion of document. Client said her daughter was reviewing Advanced Directives packet provided to client by [employer]. CSW offered to assist client as needed with information or completion of Advanced Directives documents for client. Client said she wanted to speak further with her daughter about Advanced Directives completion for client. CSW thanked client for phone call with CSW on 06/10/17. Client was appreciative of phone call from [place] on 06/10/17.    Plan:  CSW Candace Cervantes to call client in 4 weeks to assess client status on completion of Advanced Directives documents for client.  Client to communicate with her daughter to review Advanced Directives booklet and to discuss Advanced Directives completion with her daughter.   Candace Cervantes.Candace Cervantes MSW, LCSW Licensed Clinical Social Worker [facility] Care Management [phone]

## 2017-06-10 NOTE — Telephone Encounter (Signed)
Routing to Dr Moshe Cipro to advise

## 2017-06-10 NOTE — Telephone Encounter (Signed)
Patient called to request a referral to a dermatologist (recomended by her heart doctor) for itchy bumps that keep coming up on her leg  Cb#: [phone]

## 2017-06-11 ENCOUNTER — Other Ambulatory Visit: Payer: Self-pay | Admitting: Family Medicine

## 2017-06-11 DIAGNOSIS — R21 Rash and other nonspecific skin eruption: Secondary | ICD-10-CM

## 2017-06-11 NOTE — Telephone Encounter (Signed)
pls let her know I will refer to Dr Juel Burrow office , I have entered the referral ,pls follow through

## 2017-06-14 ENCOUNTER — Encounter: Payer: Self-pay | Admitting: Family Medicine

## 2017-06-14 ENCOUNTER — Other Ambulatory Visit: Payer: Self-pay | Admitting: Family Medicine

## 2017-06-14 ENCOUNTER — Ambulatory Visit (INDEPENDENT_AMBULATORY_CARE_PROVIDER_SITE_OTHER): Payer: Medicare Other | Admitting: Family Medicine

## 2017-06-14 ENCOUNTER — Telehealth: Payer: Self-pay | Admitting: Family Medicine

## 2017-06-14 VITALS — BP 138/82 | HR 81 | Temp 97.0°F | Resp 16 | Ht 70.0 in | Wt 159.8 lb

## 2017-06-14 DIAGNOSIS — I10 Essential (primary) hypertension: Secondary | ICD-10-CM

## 2017-06-14 DIAGNOSIS — R21 Rash and other nonspecific skin eruption: Secondary | ICD-10-CM | POA: Diagnosis not present

## 2017-06-14 DIAGNOSIS — M7989 Other specified soft tissue disorders: Secondary | ICD-10-CM | POA: Diagnosis not present

## 2017-06-14 DIAGNOSIS — Z23 Encounter for immunization: Secondary | ICD-10-CM

## 2017-06-14 MED ORDER — INDOMETHACIN 25 MG PO CAPS: ORAL_CAPSULE | ORAL | 0 refills | Status: DC

## 2017-06-14 NOTE — Patient Instructions (Addendum)
F/u as before  Medication sent to your  Pharmacy for 3 days for swelling of left finger joint  Happy to know that you are  j keeping busy and content  Please see if you can get appt info for the dermatology appointment that was  Requested   Flu vaccine today  Thank you  for choosing [facility]. We consider it a privelige to serve you.  Delivering excellent health care in a caring and  compassionate way is our goal.  Partnering with you,  so that together we can achieve this goal is our strategy.

## 2017-06-14 NOTE — Telephone Encounter (Signed)
Called patient to let  Her know she is scheduled with Dr.Hall's office on 06/23/17 at 1pm, for the rash on her leg

## 2017-06-14 NOTE — Progress Notes (Signed)
   Candace Cervantes     MRN: [id number]      DOB: [birth year]   HPI [name] is here with a 1 week h/o pain and swelling of left ring finger, she denies trauma to the finger, her ring is on the finger and is not painful. C/o rash on legs with itch, wants to ho to dermatology and referral has been sent Needs flu vaccine today  ROS Denies recent fever or chills. Denies sinus pressure, nasal congestion, ear pain or sore throat. Denies chest congestion, productive cough or wheezing. Denies chest pains, palpitations and leg swelling Denies abdominal pain, nausea, vomiting,diarrhea or constipation.   Denies dysuria, frequency, hesitancy or incontinence.  Denies headaches, seizures, numbness, or tingling. Denies depression, anxiety or insomnia.Currently has found an old friend and is happy/ content    PE  Pulse 81   Temp (!) 97 F (36.1 C) (Other (Comment))   Resp 16   Ht 5\' 10"  (1.778 m)   Wt 159 lb 12 oz (72.5 kg)   SpO2 98%   BMI 22.92 kg/m   Patient alert and oriented and in no cardiopulmonary distress.  HEENT: No facial asymmetry, EOMI,   oropharynx pink and moist.  Neck supple no JVD, no mass.  Chest: Clear to auscultation bilaterally.  CVS: S1, S2 no murmurs, no S3.Regular rate.  ABD: Soft non tender.   Ext: No edema  MS: Adequate though reduced  ROM spine, shoulders, hips and knees.Marked swelling of first MP joint of left ring finger, no significant warmth or tenderness.Unable to remove ring from finger  Skin: Intact, hyperpigmented annular lesions on both lower extremities.  Psych: Good eye contact, normal affect. Memory mildly impaired not anxious or depressed appearing.  CNS: CN 2-12 intact, power,  normal throughout.no focal deficits noted.   Assessment & Plan  Swelling of left ring finger Acute swelling of joint, short course of anti inflammatory prescribed  Rash and nonspecific skin eruption Hyperpigmented pruritic rash on lower extremities for months  , referred to dermatology and awaiting appt info  Essential hypertension Controlled, no change in medication

## 2017-06-15 ENCOUNTER — Other Ambulatory Visit: Payer: Self-pay

## 2017-06-16 ENCOUNTER — Encounter: Payer: Self-pay | Admitting: Family Medicine

## 2017-06-16 NOTE — Assessment & Plan Note (Signed)
Acute swelling of joint, short course of anti inflammatory prescribed

## 2017-06-16 NOTE — Assessment & Plan Note (Signed)
Controlled, no change in medication  

## 2017-06-16 NOTE — Assessment & Plan Note (Signed)
Hyperpigmented pruritic rash on lower extremities for months , referred to dermatology and awaiting appt info

## 2017-06-29 ENCOUNTER — Ambulatory Visit: Payer: Self-pay | Admitting: *Deleted

## 2017-07-01 NOTE — Progress Notes (Signed)
Cardiology Office Note   Date:  07/02/2017   ID:  Candace, Cervantes March 18, 1925, MRN [id number]  PCP:  Candace Helper, MD  Cardiologist:  Candace Cervantes  Chief Complaint  Patient presents with  . Hypertension  . Atrial Fibrillation  . Coronary Artery Disease      History of Present Illness: Candace Cervantes is a 83 y.o. female who presents for ongoing assessment and management of atrial fibrillation, with a history of symptomatic bradycardia status post pacemaker placement, coronary artery disease, and hypertension. The patient was last seen in the office in June 2018. At that time she was complaining of dizziness and feeling unsteady on her feet. On that last office visit Candace Cervantes was discontinued and she was changed to low-dose losartan 50 mg daily, with plan to remove diuretic portion of her antihypertensive to see if this is helpful for her symptoms.  She was seen by primary care on 06/14/2017, at that time she was without complaints of further dizziness. Blood pressure was 138/82.  She is doing very well. No complaints and medically compliant.   Past Medical History:  Diagnosis Date  . Anxiety disorder   . Arthritis   . CAD (coronary artery disease) 2005   Nonobstructive 40% RCA lesion and normal ejection fraction at cath   . GERD (gastroesophageal reflux disease)   . Hyperlipidemia   . Hypertension   . Osteoporosis   . Pacemaker 2002   Permanent Placement  . Pacemaker 2007   Generator Changed   . Sinoatrial node dysfunction (HCC)   . SSS (sick sinus syndrome) ([place])   . Zenker diverticulum    previously evaluated by Dr. Erik Obey in 1884.     Past Surgical History:  Procedure Laterality Date  . CHOLECYSTECTOMY    . COLONOSCOPY  2003   no polyps.  . CYSTECTOMY     from back of the neck  . ESOPHAGOGASTRODUODENOSCOPY  2008   Dr. Oneida Alar, difficulty passing scope through UES, incomplete fibrous ring at GEJ, hh, multiple benign gastri polyps  . ESOPHAGOGASTRODUODENOSCOPY  N/A 10/17/2013   ZENKER'S, STRICTURE: 10-12.8 MM,  NSAID GASTRITIS FG POLYPS  . ESOPHAGOGASTRODUODENOSCOPY (EGD) WITH ESOPHAGEAL DILATION N/A 10/30/2013   SLF: 1. Zenkers diverticulum with a large opening at the cricopharyngeus 2. 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 Innumerable Fundic gland polyps 4. Mild NSAID gastritis  . MALONEY DILATION N/A 10/17/2013   Procedure: MALONEY DILATION;  Surgeon: Candace Binder, MD;  Location: [facility];  Service: Endoscopy;  Laterality: N/A;  with PEDS GASTROSCOPE  . PACEMAKER INSERTION  2002  . PACEMAKER PLACEMENT  2007   replacement   . SAVORY DILATION N/A 10/17/2013   Procedure: SAVORY DILATION;  Surgeon: Candace Binder, MD;  Location: [facility];  Service: Endoscopy;  Laterality: N/A;  with PEDS GASTROSCOPE  . TOTAL ABDOMINAL HYSTERECTOMY  2002     Current Outpatient Prescriptions  Medication Sig Dispense Refill  . ACETAMINOPHEN EXTRA STRENGTH PO Take 650 mg by mouth daily as needed (pain).    . citalopram (CELEXA) 10 MG tablet TAKE 1 TABLET BY MOUTH DAILY 90 tablet 1  . famotidine (PEPCID) 10 MG tablet Take 1 tablet (10 mg total) by mouth daily before supper. 30 tablet 5  . Multiple Vitamin (MULTIVITAMIN WITH MINERALS) TABS tablet Take 1 tablet by mouth daily.    . nitroGLYCERIN (NITROSTAT) 0.4 MG SL tablet Place 1 tablet (0.4 mg total) under the tongue every 5 (five) minutes as needed for chest pain. One tablet  under the tongue at onset of chest pains, repeat every five minutes as needed 25 tablet 3  . omeprazole (PRILOSEC) 20 MG capsule TAKE 1 CAPSULE BY MOUTH DAILY 90 capsule 1  . pravastatin (PRAVACHOL) 40 MG tablet Take 1 tablet (40 mg total) by mouth daily. 30 tablet 5  . triamterene-hydrochlorothiazide (Candace Cervantes-25) 37.5-25 MG tablet Take 1 tablet by mouth daily. 90 tablet 3  . busPIRone (BUSPAR) 5 MG tablet TAKE 1 TABLET(5 MG) BY MOUTH TWICE DAILY (Patient not taking: Reported on 07/02/2017) 180 tablet 1  . indomethacin (INDOCIN) 25 MG capsule  One capsule 3 times daily for 1 day, then one day one capsule two times daily for one day , then one capsule one daily for one day (Patient not taking: Reported on 07/02/2017) 6 capsule 0   No current facility-administered medications for this visit.     Allergies:   Patient has no known allergies.    Social History:  The patient  reports that she has never smoked. She has never used smokeless tobacco. She reports that she does not drink alcohol or use drugs.   Family History:  The patient's family history includes Alcohol abuse in her brother; Cancer in her brother; Colon cancer in her other; Heart disease in her mother; Lung disease in her sister.    ROS: All other systems are reviewed and negative. Unless otherwise mentioned in H&P    PHYSICAL EXAM: VS:  BP 136/78   Pulse 65   Ht 5\' 11"  (1.803 m)   Wt 159 lb (72.1 kg)   SpO2 98%   BMI 22.18 kg/m  , BMI Body mass index is 22.18 kg/m. GEN: Well nourished, well developed, in no acute distress  HEENT: normal  Neck: no JVD, carotid bruits, or masses Cardiac: RRR; no murmurs, rubs, or gallops,no edema  Respiratory:  clear to auscultation bilaterally, normal work of breathing GI: soft, nontender, nondistended, + BS MS: no deformity or atrophy  Skin: warm and dry, no rash Neuro:  Strength and sensation are intact Psych: euthymic mood, full affect    Recent Labs: 03/08/17: Magnesium 2.0 03/03/2017: ALT 17; TSH 3.80 05/11/2017: BUN 20; Creat 1.06; Hemoglobin 11.6; Platelets 192; Potassium 4.1; Sodium 139    Lipid Panel    Component Value Date/Time   CHOL 190 03/03/2017 1455   TRIG 85 03/03/2017 1455   HDL 96 03/03/2017 1455   CHOLHDL 2.0 03/03/2017 1455   VLDL 17 03/03/2017 1455   LDLCALC 77 03/03/2017 1455      Wt Readings from Last 3 Encounters:  07/02/17 159 lb (72.1 kg)  06/14/17 159 lb 12 oz (72.5 kg)  05/28/17 160 lb (72.6 kg)      Other studies Reviewed: Echocardiogram Mar 08, 2017 Left ventricle: The cavity  size was normal. Wall thickness was   increased in a pattern of mild LVH. Systolic function was normal.   The estimated ejection fraction was in the range of 60% to 65%.   Wall motion was normal; there were no regional wall motion   abnormalities. Doppler parameters are consistent with abnormal   left ventricular relaxation (grade 1 diastolic dysfunction). - Aortic valve: Mildly calcified annulus. Trileaflet; mildly   calcified leaflets. There was trivial regurgitation. - Mitral valve: There was mild regurgitation. - Right ventricle: Pacer wire or catheter noted in right ventricle. - Atrial septum: No defect or patent foramen ovale was identified. - Tricuspid valve: There was trivial regurgitation. - Pulmonary arteries: PA peak pressure: 29 mm Hg (S). - Pericardium,  extracardiac: There was no pericardial effusion.    ASSESSMENT AND PLAN:  1. CAD: No cardiac complaints. She is doing well. She is medically compliant. No changes in her regimen,  2. Hypertension: Excellent control. Will not make any changes in her regimen at this time.  3. Paroxysmal atrial fibrillation: Currently in sinus rhythm. She offers no complaints of palpitation heart racing. She is not currently on anticoagulation therapy.     Current medicines are reviewed at length with the patient today.    Labs/ tests ordered today include:  Phill Myron. [place] Pugh, ANP, AACC   07/02/2017 1:21 PM    [facility] [address], [place], [place] [address] Phone: [phone]; Fax: [phone]

## 2017-07-02 ENCOUNTER — Other Ambulatory Visit: Payer: Self-pay | Admitting: *Deleted

## 2017-07-02 ENCOUNTER — Encounter: Payer: Self-pay | Admitting: Adult Health

## 2017-07-02 ENCOUNTER — Ambulatory Visit (INDEPENDENT_AMBULATORY_CARE_PROVIDER_SITE_OTHER): Payer: Medicare Other | Admitting: Adult Health

## 2017-07-02 ENCOUNTER — Encounter: Payer: Self-pay | Admitting: *Deleted

## 2017-07-02 VITALS — BP 136/78 | HR 65 | Ht 71.0 in | Wt 159.0 lb

## 2017-07-02 DIAGNOSIS — I2510 Atherosclerotic heart disease of native coronary artery without angina pectoris: Secondary | ICD-10-CM | POA: Diagnosis not present

## 2017-07-02 DIAGNOSIS — I10 Essential (primary) hypertension: Secondary | ICD-10-CM

## 2017-07-02 NOTE — Patient Outreach (Signed)
[name]) Care Management  07/02/2017  Candace Cervantes 23-Dec-1924 [id number]   Phone call to patient to follow up on Advanced Directive completion and to assess for continued social work needs. Per patient, her daughter has the Advanced Directive form, however has not assisted with the completion due to her work schedule. However per patient, next week should be a better week and should have it done then. Patient verbalized having no additional community resource needs and is confident that she will have the Advanced Directive completed with the help of her daughter.  Patient to be closed to [facility] care management at this time.    [name] [facility] Care Management [phone]

## 2017-07-02 NOTE — Patient Instructions (Signed)

## 2017-07-07 ENCOUNTER — Ambulatory Visit: Payer: Medicare Other

## 2017-07-07 ENCOUNTER — Ambulatory Visit: Payer: Medicare Other | Admitting: Family Medicine

## 2017-07-07 ENCOUNTER — Other Ambulatory Visit: Payer: Self-pay | Admitting: Licensed Clinical Social Worker

## 2017-07-09 ENCOUNTER — Ambulatory Visit: Payer: Self-pay | Admitting: Licensed Clinical Social Worker

## 2017-07-12 ENCOUNTER — Ambulatory Visit (INDEPENDENT_AMBULATORY_CARE_PROVIDER_SITE_OTHER): Payer: Medicare Other

## 2017-07-12 VITALS — BP 140/82 | HR 60 | Temp 97.8°F | Ht 71.0 in | Wt 161.0 lb

## 2017-07-12 DIAGNOSIS — Z0000 Encounter for general adult medical examination without abnormal findings: Secondary | ICD-10-CM | POA: Diagnosis not present

## 2017-07-12 NOTE — Patient Instructions (Addendum)
Candace Cervantes , Thank you for taking time to come for your Medicare Wellness Visit. I appreciate your ongoing commitment to your health goals. Please review the following plan we discussed and let me know if I can assist you in the future.   Screening recommendations/referrals: Colonoscopy: No longer required due to age 83: No longer required due to age Bone Density: Up to date Recommended yearly ophthalmology/optometry visit for glaucoma screening and checkup Recommended yearly dental visit for hygiene and checkup  Vaccinations: Influenza vaccine: Up to date Pneumococcal vaccine: Up to date Tdap vaccine: Up to date, next due 08/2021 Shingles vaccine: Done    Advanced directives: Advance directive discussed with you today. I have provided a copy for you to complete at home and have notarized. Once this is complete please bring a copy in to our office so we can scan it into your chart.  Next appointment: Follow up with Dr. Moshe Cipro on 10/07/2017 at 1:40 pm. Follow up in 1 year for your annual wellness visit.   Preventive Care 62 Years and Older, Female Preventive care refers to lifestyle choices and visits with your health care provider that can promote health and wellness. What does preventive care include?  A yearly physical exam. This is also called an annual well check.  Dental exams once or twice a year.  Routine eye exams. Ask your health care provider how often you should have your eyes checked.  Personal lifestyle choices, including:  Daily care of your teeth and gums.  Regular physical activity.  Eating a healthy diet.  Avoiding tobacco and drug use.  Limiting alcohol use.  Practicing safe sex.  Taking low-dose aspirin every day.  Taking vitamin and mineral supplements as recommended by your health care provider. What happens during an annual well check? The services and screenings done by your health care provider during your annual well check will depend on  your age, overall health, lifestyle risk factors, and family history of disease. Counseling  Your health care provider may ask you questions about your:  Alcohol use.  Tobacco use.  Drug use.  Emotional well-being.  Home and relationship well-being.  Sexual activity.  Eating habits.  History of falls.  Memory and ability to understand (cognition).  Work and work Statistician.  Reproductive health. Screening  You may have the following tests or measurements:  Height, weight, and BMI.  Blood pressure.  Lipid and cholesterol levels. These may be checked every 5 years, or more frequently if you are over 72 years old.  Skin check.  Lung cancer screening. You may have this screening every year starting at age 36 if you have a 30-pack-year history of smoking and currently smoke or have quit within the past 15 years.  Fecal occult blood test (FOBT) of the stool. You may have this test every year starting at age 52.  Flexible sigmoidoscopy or colonoscopy. You may have a sigmoidoscopy every 5 years or a colonoscopy every 10 years starting at age 75.  Hepatitis C blood test.  Hepatitis B blood test.  Sexually transmitted disease (STD) testing.  Diabetes screening. This is done by checking your blood sugar (glucose) after you have not eaten for a while (fasting). You may have this done every 1-3 years.  Bone density scan. This is done to screen for osteoporosis. You may have this done starting at age 7.  Mammogram. This may be done every 1-2 years. Talk to your health care provider about how often you should have regular mammograms.  Talk with your health care provider about your test results, treatment options, and if necessary, the need for more tests. Vaccines  Your health care provider may recommend certain vaccines, such as:  Influenza vaccine. This is recommended every year.  Tetanus, diphtheria, and acellular pertussis (Tdap, Td) vaccine. You may need a Td booster  every 10 years.  Zoster vaccine. You may need this after age 31.  Pneumococcal 13-valent conjugate (PCV13) vaccine. One dose is recommended after age 27.  Pneumococcal polysaccharide (PPSV23) vaccine. One dose is recommended after age 53. Talk to your health care provider about which screenings and vaccines you need and how often you need them. This information is not intended to replace advice given to you by your health care provider. Make sure you discuss any questions you have with your health care provider. Document Released: 10/18/2015 Document Revised: 06/10/2016 Document Reviewed: 07/23/2015 Elsevier Interactive Patient Education  2017 [place] Prevention in the Home Falls can cause injuries. They can happen to people of all ages. There are many things you can do to make your home safe and to help prevent falls. What can I do on the outside of my home?  Regularly fix the edges of walkways and driveways and fix any cracks.  Remove anything that might make you trip as you walk through a door, such as a raised step or threshold.  Trim any bushes or trees on the path to your home.  Use bright outdoor lighting.  Clear any walking paths of anything that might make someone trip, such as rocks or tools.  Regularly check to see if handrails are loose or broken. Make sure that both sides of any steps have handrails.  Any raised decks and porches should have guardrails on the edges.  Have any leaves, snow, or ice cleared regularly.  Use sand or salt on walking paths during winter.  Clean up any spills in your garage right away. This includes oil or grease spills. What can I do in the bathroom?  Use night lights.  Install grab bars by the toilet and in the tub and shower. Do not use towel bars as grab bars.  Use non-skid mats or decals in the tub or shower.  If you need to sit down in the shower, use a plastic, non-slip stool.  Keep the floor dry. Clean up any  water that spills on the floor as soon as it happens.  Remove soap buildup in the tub or shower regularly.  Attach bath mats securely with double-sided non-slip rug tape.  Do not have throw rugs and other things on the floor that can make you trip. What can I do in the bedroom?  Use night lights.  Make sure that you have a light by your bed that is easy to reach.  Do not use any sheets or blankets that are too big for your bed. They should not hang down onto the floor.  Have a firm chair that has side arms. You can use this for support while you get dressed.  Do not have throw rugs and other things on the floor that can make you trip. What can I do in the kitchen?  Clean up any spills right away.  Avoid walking on wet floors.  Keep items that you use a lot in easy-to-reach places.  If you need to reach something above you, use a strong step stool that has a grab bar.  Keep electrical cords out of the way.  Do not use floor polish or wax that makes floors slippery. If you must use wax, use non-skid floor wax.  Do not have throw rugs and other things on the floor that can make you trip. What can I do with my stairs?  Do not leave any items on the stairs.  Make sure that there are handrails on both sides of the stairs and use them. Fix handrails that are broken or loose. Make sure that handrails are as long as the stairways.  Check any carpeting to make sure that it is firmly attached to the stairs. Fix any carpet that is loose or worn.  Avoid having throw rugs at the top or bottom of the stairs. If you do have throw rugs, attach them to the floor with carpet tape.  Make sure that you have a light switch at the top of the stairs and the bottom of the stairs. If you do not have them, ask someone to add them for you. What else can I do to help prevent falls?  Wear shoes that:  Do not have high heels.  Have rubber bottoms.  Are comfortable and fit you well.  Are closed  at the toe. Do not wear sandals.  If you use a stepladder:  Make sure that it is fully opened. Do not climb a closed stepladder.  Make sure that both sides of the stepladder are locked into place.  Ask someone to hold it for you, if possible.  Clearly mark and make sure that you can see:  Any grab bars or handrails.  First and last steps.  Where the edge of each step is.  Use tools that help you move around (mobility aids) if they are needed. These include:  Canes.  Walkers.  Scooters.  Crutches.  Turn on the lights when you go into a dark area. Replace any light bulbs as soon as they burn out.  Set up your furniture so you have a clear path. Avoid moving your furniture around.  If any of your floors are uneven, fix them.  If there are any pets around you, be aware of where they are.  Review your medicines with your doctor. Some medicines can make you feel dizzy. This can increase your chance of falling. Ask your doctor what other things that you can do to help prevent falls. This information is not intended to replace advice given to you by your health care provider. Make sure you discuss any questions you have with your health care provider. Document Released: 07/18/2009 Document Revised: 02/27/2016 Document Reviewed: 10/26/2014 Elsevier Interactive Patient Education  2017 [employer].

## 2017-07-12 NOTE — Progress Notes (Signed)
Subjective:   Candace Cervantes is a 83 y.o. female who presents for Medicare Annual (Subsequent) preventive examination.  Review of Systems:  Cardiac Risk Factors include: advanced age (>95men, >63 women);dyslipidemia;hypertension;sedentary lifestyle     Objective:     Vitals: BP 140/82   Pulse 60   Temp 97.8 F (36.6 C) (Temporal)   Ht 5\' 11"  (1.803 m)   Wt 161 lb (73 kg)   SpO2 97%   BMI 22.45 kg/m   Body mass index is 22.45 kg/m.   Tobacco History  Smoking Status  . Never Smoker  Smokeless Tobacco  . Never Used     Counseling given: Not Answered   Past Medical History:  Diagnosis Date  . Anxiety disorder   . Arthritis   . CAD (coronary artery disease) 2005   Nonobstructive 40% RCA lesion and normal ejection fraction at cath   . GERD (gastroesophageal reflux disease)   . Hyperlipidemia   . Hypertension   . Osteoporosis   . Pacemaker 2002   Permanent Placement  . Pacemaker 2007   Generator Changed   . Sinoatrial node dysfunction (HCC)   . SSS (sick sinus syndrome) ([place])   . Zenker diverticulum    previously evaluated by Dr. Erik Obey in [id number].    Past Surgical History:  Procedure Laterality Date  . CHOLECYSTECTOMY    . COLONOSCOPY  2003   no polyps.  . CYSTECTOMY     from back of the neck  . ESOPHAGOGASTRODUODENOSCOPY  2008   Dr. Oneida Alar, difficulty passing scope through UES, incomplete fibrous ring at GEJ, hh, multiple benign gastri polyps  . ESOPHAGOGASTRODUODENOSCOPY N/A 10/17/2013   ZENKER'S, STRICTURE: 10-12.8 MM,  NSAID GASTRITIS FG POLYPS  . ESOPHAGOGASTRODUODENOSCOPY (EGD) WITH ESOPHAGEAL DILATION N/A 10/30/2013   SLF: 1. Zenkers diverticulum with a large opening at the cricopharyngeus 2. 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 Innumerable Fundic gland polyps 4. Mild NSAID gastritis  . MALONEY DILATION N/A 10/17/2013   Procedure: MALONEY DILATION;  Surgeon: Danie Binder, MD;  Location: [facility];  Service: Endoscopy;  Laterality: N/A;  with PEDS  GASTROSCOPE  . PACEMAKER INSERTION  2002  . PACEMAKER PLACEMENT  2007   replacement   . SAVORY DILATION N/A 10/17/2013   Procedure: SAVORY DILATION;  Surgeon: Danie Binder, MD;  Location: [facility];  Service: Endoscopy;  Laterality: N/A;  with PEDS GASTROSCOPE  . TOTAL ABDOMINAL HYSTERECTOMY  2002   Family History  Problem Relation Age of Onset  . Heart disease Mother   . Lung disease Sister   . Alcohol abuse Brother   . Cancer Brother        possible prostate  . Diabetes Sister   . Colon cancer Other        sibling, age 66   History  Sexual Activity  . Sexual activity: Not Currently    Outpatient Encounter Prescriptions as of 07/12/2017  Medication Sig  . ACETAMINOPHEN EXTRA STRENGTH PO Take 650 mg by mouth daily as needed (pain).  . citalopram (CELEXA) 10 MG tablet TAKE 1 TABLET BY MOUTH DAILY  . famotidine (PEPCID) 10 MG tablet Take 1 tablet (10 mg total) by mouth daily before supper.  . Multiple Vitamin (MULTIVITAMIN WITH MINERALS) TABS tablet Take 1 tablet by mouth daily.  . nitroGLYCERIN (NITROSTAT) 0.4 MG SL tablet Place 1 tablet (0.4 mg total) under the tongue every 5 (five) minutes as needed for chest pain. One tablet under the tongue at onset of chest pains,  repeat every five minutes as needed  . omeprazole (PRILOSEC) 20 MG capsule TAKE 1 CAPSULE BY MOUTH DAILY  . pravastatin (PRAVACHOL) 40 MG tablet Take 1 tablet (40 mg total) by mouth daily.  Marland Kitchen triamterene-hydrochlorothiazide (MAXZIDE-25) 37.5-25 MG tablet Take 1 tablet by mouth daily.  . busPIRone (BUSPAR) 5 MG tablet TAKE 1 TABLET(5 MG) BY MOUTH TWICE DAILY (Patient not taking: Reported on 07/12/2017)  . indomethacin (INDOCIN) 25 MG capsule One capsule 3 times daily for 1 day, then one day one capsule two times daily for one day , then one capsule one daily for one day (Patient not taking: Reported on 07/02/2017)   No facility-administered encounter medications on file as of 07/12/2017.     Activities of Daily  Living In your present state of health, do you have any difficulty performing the following activities: 07/12/2017 04/27/2017  Hearing? Tempie Donning  Vision? N Y  Difficulty concentrating or making decisions? Y Y  Comment - little, some times daughter does finances   Walking or climbing stairs? Y Y  Dressing or bathing? N N  Doing errands, shopping? N Y  Conservation officer, nature and eating ? N N  Using the Toilet? N N  In the past six months, have you accidently leaked urine? Y -  Do you have problems with loss of bowel control? Y N  Managing your Medications? N N  Managing your Finances? Tempie Donning  Housekeeping or managing your Housekeeping? N N  Some recent data might be hidden    Patient Care Team: Fayrene Helper, MD as PCP - [facility] N[facility] Phill Myron, NP as Nurse Practitioner (Nurse Practitioner) Yehuda Savannah, MD as Attending Physician (Cardiology) Danie Binder, MD as Consulting Physician (Gastroenterology) Rutherford Guys, MD as Consulting Physician (Ophthalmology) Allyn Kenner, MD as Consulting Physician (Dermatology)    Assessment:    Exercise Activities and Dietary recommendations Current Exercise Habits: The patient does not participate in regular exercise at present, Exercise limited by: None identified  Goals    . Exercise 3x per week (30 min per time)          Recommend starting a routine exercise program at least 3 days a week for 30-45 minutes at a time as tolerated.        Fall Risk Fall Risk  07/12/2017 04/27/2017 03/03/2017 11/05/2016 12/25/2015  Falls in the past year? No Yes No No No  Number falls in past yr: - 1 - - -  Injury with Fall? - No - - -  Risk for fall due to : Impaired balance/gait;Impaired mobility - - - -  Follow up - Falls prevention discussed - - -   Depression Screen PHQ 2/9 Scores 07/12/2017 04/27/2017 03/03/2017 11/05/2016  PHQ - 2 Score 1 1 0 0  PHQ- 9 Score - - - -     Cognitive Function: Normal 6CIT Screen 07/12/2017  What Year? 0 points  What  month? 0 points  What time? 0 points  Count back from 20 0 points  Months in reverse 0 points  Repeat phrase 2 points  Total Score 2    Immunization History  Administered Date(s) Administered  . H1N1 09/20/2008  . Influenza Split 08/11/2011, 06/22/2012  . Influenza Whole 06/30/2007, 07/08/2009, 07/09/2010  . Influenza,inj,Quad PF,6+ Mos 06/20/2013, 05/28/2014, 06/13/2015, 06/02/2016, 06/14/2017  . Pneumococcal Conjugate-13 04/03/2014  . Pneumococcal Polysaccharide-23 09/04/2003  . Td 02/20/2004  . Tdap 08/18/2011  . Zoster 05/24/2008   Screening Tests Health Maintenance  Topic Date Due  .  TETANUS/TDAP  08/17/2021  . INFLUENZA VACCINE  Completed  . DEXA SCAN  Completed  . PNA vac Low Risk Adult  Completed      Plan:   I have personally reviewed and noted the following in the patient's chart:   . Medical and social history . Use of alcohol, tobacco or illicit drugs  . Current medications and supplements . Functional ability and status . Nutritional status . Physical activity . Advanced directives . List of other physicians . Hospitalizations, surgeries, and ER visits in previous 12 months . Vitals . Screenings to include cognitive, depression, and falls . Referrals and appointments  In addition, I have reviewed and discussed with patient certain preventive protocols, quality metrics, and best practice recommendations. A written personalized care plan for preventive services as well as general preventive health recommendations were provided to patient.     Stormy Fabian, LPN  [id number]

## 2017-07-15 ENCOUNTER — Other Ambulatory Visit: Payer: Self-pay | Admitting: Family Medicine

## 2017-07-15 NOTE — Telephone Encounter (Signed)
Seen [phone]

## 2017-07-19 DIAGNOSIS — S70361A Insect bite (nonvenomous), right thigh, initial encounter: Secondary | ICD-10-CM | POA: Diagnosis not present

## 2017-07-19 DIAGNOSIS — L818 Other specified disorders of pigmentation: Secondary | ICD-10-CM | POA: Diagnosis not present

## 2017-08-03 DIAGNOSIS — H5203 Hypermetropia, bilateral: Secondary | ICD-10-CM | POA: Diagnosis not present

## 2017-08-03 DIAGNOSIS — H524 Presbyopia: Secondary | ICD-10-CM | POA: Diagnosis not present

## 2017-08-03 DIAGNOSIS — H52203 Unspecified astigmatism, bilateral: Secondary | ICD-10-CM | POA: Diagnosis not present

## 2017-08-03 DIAGNOSIS — Z961 Presence of intraocular lens: Secondary | ICD-10-CM | POA: Diagnosis not present

## 2017-09-14 ENCOUNTER — Other Ambulatory Visit: Payer: Self-pay | Admitting: Family Medicine

## 2017-10-07 ENCOUNTER — Ambulatory Visit (INDEPENDENT_AMBULATORY_CARE_PROVIDER_SITE_OTHER): Payer: Medicare Other | Admitting: Family Medicine

## 2017-10-07 ENCOUNTER — Ambulatory Visit: Payer: Medicare Other | Admitting: Family Medicine

## 2017-10-07 ENCOUNTER — Encounter: Payer: Self-pay | Admitting: Family Medicine

## 2017-10-07 VITALS — BP 160/80 | HR 80 | Resp 16 | Ht 65.0 in | Wt 159.0 lb

## 2017-10-07 DIAGNOSIS — I4891 Unspecified atrial fibrillation: Secondary | ICD-10-CM | POA: Diagnosis not present

## 2017-10-07 DIAGNOSIS — F411 Generalized anxiety disorder: Secondary | ICD-10-CM | POA: Diagnosis not present

## 2017-10-07 DIAGNOSIS — G8929 Other chronic pain: Secondary | ICD-10-CM

## 2017-10-07 DIAGNOSIS — E785 Hyperlipidemia, unspecified: Secondary | ICD-10-CM | POA: Diagnosis not present

## 2017-10-07 DIAGNOSIS — R7302 Impaired glucose tolerance (oral): Secondary | ICD-10-CM | POA: Diagnosis not present

## 2017-10-07 DIAGNOSIS — I10 Essential (primary) hypertension: Secondary | ICD-10-CM

## 2017-10-07 DIAGNOSIS — R51 Headache: Secondary | ICD-10-CM

## 2017-10-07 DIAGNOSIS — R519 Headache, unspecified: Secondary | ICD-10-CM

## 2017-10-07 DIAGNOSIS — Z87898 Personal history of other specified conditions: Secondary | ICD-10-CM

## 2017-10-07 NOTE — Patient Instructions (Addendum)
Nurse BP check in 6 weeks , please stop sausage and excess salt  MD follow up  In 3 months, needs fasting lipid , cmp and EGFR and vit D for 3,CBC hBA1C month visit   Citalopram is being reduced to half tablet for 2 weeks, then stop after that. This is for anxiety and depression and you do not have this anymore, the pharmacy will take care of changing the medication for you  Headache is from severe arthritis in your neck, you may take tylenol 325 mg one daily, as needed, and massage  Both sides of the neck twice daily and do gentle stretching exercises of your neck twice daily as we did in the office today  Be careful not to fall  All the best for 2019!

## 2017-10-09 ENCOUNTER — Other Ambulatory Visit: Payer: Self-pay | Admitting: Family Medicine

## 2017-10-09 ENCOUNTER — Encounter: Payer: Self-pay | Admitting: Family Medicine

## 2017-10-09 DIAGNOSIS — R519 Headache, unspecified: Secondary | ICD-10-CM

## 2017-10-09 DIAGNOSIS — R51 Headache: Secondary | ICD-10-CM

## 2017-10-09 DIAGNOSIS — G8929 Other chronic pain: Secondary | ICD-10-CM

## 2017-10-09 HISTORY — DX: Headache, unspecified: R51.9

## 2017-10-09 HISTORY — DX: Other chronic pain: G89.29

## 2017-10-09 NOTE — Assessment & Plan Note (Signed)
Patient educated about the importance of limiting  Carbohydrate intake , the need to commit to daily physical activity for a minimum of 30 minutes , and to commit weight loss. The fact that changes in all these areas will reduce or eliminate all together the development of diabetes is stressed.   Diabetic Labs Latest Ref Rng & Units 05/11/2017 03/03/2017 02/20/2017 06/02/2016 04/06/2016  HbA1c <5.7 % - 5.8(H) - 5.9(H) -  Chol <200 mg/dL - 190 - 195 -  HDL >50 mg/dL - 96 - 94 -  Calc LDL <100 mg/dL - 77 - 91 -  Triglycerides <150 mg/dL - 85 - 50 -  Creatinine 0.60 - 0.88 mg/dL 1.06(H) 1.22(H) 1.13(H) 1.03(H) 0.98(H)   BP/Weight 10/07/2017 07/12/2017 07/02/2017 06/14/2017 05/28/2017 05/11/2017 3/55/7322  Systolic BP 025 427 062 376 283 151 761  Diastolic BP 80 82 78 82 82 74 70  Wt. (Lbs) 159 161 159 159.75 160 160 162  BMI 26.46 22.45 22.18 22.92 22.96 22.96 23.24   No flowsheet data found.  Updated lab needed at/ before next visit.

## 2017-10-09 NOTE — Assessment & Plan Note (Signed)
Markedly improved, taper off of citalopram over the next 4 weeks

## 2017-10-09 NOTE — Assessment & Plan Note (Signed)
Headache due to severe arthritis of c spine, no neurological impairment on exam or by history,p counseled re neck exercises and use of massage therapy at [facility] to help with headache and tylenol for arthritis

## 2017-10-09 NOTE — Assessment & Plan Note (Signed)
Rate controlled , no anticoagulant due to high fall risk

## 2017-10-09 NOTE — Assessment & Plan Note (Signed)
Not at goal, needs nurse BP re eval in 4 to 6 weeks, needs to reduce salt intake DASH diet and commitment to daily physical activity for a minimum of 30 minutes discussed and encouraged, as a part of hypertension management. The importance of attaining a healthy weight is also discussed.  BP/Weight 10/07/2017 07/12/2017 07/02/2017 06/14/2017 05/28/2017 05/11/2017 05/29/7492  Systolic BP 552 174 715 953 967 289 791  Diastolic BP 80 82 78 82 82 74 70  Wt. (Lbs) 159 161 159 159.75 160 160 162  BMI 26.46 22.45 22.18 22.92 22.96 22.96 23.24

## 2017-10-09 NOTE — Assessment & Plan Note (Signed)
Hyperlipidemia:Low fat diet discussed and encouraged.   Lipid Panel  Lab Results  Component Value Date   CHOL 190 03/03/2017   HDL 96 03/03/2017   LDLCALC 77 03/03/2017   TRIG 85 03/03/2017   CHOLHDL 2.0 03/03/2017     Updated lab needed at/ before next visit.

## 2017-10-09 NOTE — Progress Notes (Signed)
Candace Cervantes     MRN: [id number]      DOB: 06/26/1925   HPI Candace Cervantes is here for follow up and re-evaluation of chronic medical conditions, medication management and review of any available recent lab and radiology data.  Preventive health is updated, specifically  Immunization.   Questions or concerns regarding consultations or procedures which the PT has had in the interim are  addressed. The PT denies any adverse reactions to current medications since the last visit.  C/o neck pain and headache and shoulder rpains  ROS Denies recent fever or chills. Denies sinus pressure, nasal congestion, ear pain or sore throat. Denies chest congestion, productive cough or wheezing. Denies chest pains, palpitations and leg swelling Denies abdominal pain, nausea, vomiting,diarrhea or constipation.   Denies dysuria, frequency, hesitancy or incontinence.  Denies  seizures, numbness, or tingling. Denies depression, anxiety or insomnia. Denies skin break down or rash.   PE  BP (!) 160/80   Pulse 80   Resp 16   Ht 5\' 5"  (1.651 m)   Wt 159 lb (72.1 kg)   SpO2 96%   BMI 26.46 kg/m   Patient alert and oriented and in no cardiopulmonary distress.  HEENT: No facial asymmetry, EOMI,   oropharynx pink and moist.  Neck decreased ROM no JVD, no mass.  Chest: Clear to auscultation bilaterally.  CVS: S1, S2 no murmurs, no S3.Regular rate.  ABD: Soft non tender.   Ext: No edema  MS: Decreased  ROM spine, shoulders, hips and knees.  Skin: Intact, no ulcerations or rash noted.  Psych: Good eye contact, normal affect. Memory mildly impaitred not anxious or depressed appearing.  CNS: CN 2-12 intact, power,  normal throughout.no focal deficits noted.   Assessment & Plan Essential hypertension Not at goal, needs nurse BP re eval in 4 to 6 weeks, needs to reduce salt intake DASH diet and commitment to daily physical activity for a minimum of 30 minutes discussed and encouraged, as a part of  hypertension management. The importance of attaining a healthy weight is also discussed.  BP/Weight 10/07/2017 07/12/2017 07/02/2017 06/14/2017 05/28/2017 05/11/2017 7/00/1749  Systolic BP 449 675 916 384 665 993 570  Diastolic BP 80 82 78 82 82 74 70  Wt. (Lbs) 159 161 159 159.75 160 160 162  BMI 26.46 22.45 22.18 22.92 22.96 22.96 23.24       Atrial fibrillation Rate controlled , no anticoagulant due to high fall risk  IGT (impaired glucose tolerance) Patient educated about the importance of limiting  Carbohydrate intake , the need to commit to daily physical activity for a minimum of 30 minutes , and to commit weight loss. The fact that changes in all these areas will reduce or eliminate all together the development of diabetes is stressed.   Diabetic Labs Latest Ref Rng & Units 05/11/2017 03/03/2017 02/20/2017 06/02/2016 04/06/2016  HbA1c <5.7 % - 5.8(H) - 5.9(H) -  Chol <200 mg/dL - 190 - 195 -  HDL >50 mg/dL - 96 - 94 -  Calc LDL <100 mg/dL - 77 - 91 -  Triglycerides <150 mg/dL - 85 - 50 -  Creatinine 0.60 - 0.88 mg/dL 1.06(H) 1.22(H) 1.13(H) 1.03(H) 0.98(H)   BP/Weight 10/07/2017 07/12/2017 07/02/2017 06/14/2017 05/28/2017 05/11/2017 1/77/9390  Systolic BP 300 923 300 762 263 335 456  Diastolic BP 80 82 78 82 82 74 70  Wt. (Lbs) 159 161 159 159.75 160 160 162  BMI 26.46 22.45 22.18 22.92 22.96 22.96 23.24  No flowsheet data found.  Updated lab needed at/ before next visit.   Hyperlipemia Hyperlipidemia:Low fat diet discussed and encouraged.   Lipid Panel  Lab Results  Component Value Date   CHOL 190 03/03/2017   HDL 96 03/03/2017   LDLCALC 77 03/03/2017   TRIG 85 03/03/2017   CHOLHDL 2.0 03/03/2017     Updated lab needed at/ before next visit.   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

## 2017-10-09 NOTE — Assessment & Plan Note (Signed)
Anticoagulant contraindicated

## 2017-10-15 ENCOUNTER — Telehealth: Payer: Self-pay

## 2017-10-15 DIAGNOSIS — R7302 Impaired glucose tolerance (oral): Secondary | ICD-10-CM

## 2017-10-15 DIAGNOSIS — D638 Anemia in other chronic diseases classified elsewhere: Secondary | ICD-10-CM

## 2017-10-15 DIAGNOSIS — I10 Essential (primary) hypertension: Secondary | ICD-10-CM

## 2017-10-15 DIAGNOSIS — E569 Vitamin deficiency, unspecified: Secondary | ICD-10-CM

## 2017-10-15 DIAGNOSIS — E785 Hyperlipidemia, unspecified: Secondary | ICD-10-CM

## 2017-10-15 NOTE — Telephone Encounter (Signed)
Labs ordered.

## 2017-10-15 NOTE — Telephone Encounter (Signed)
-----  Message from Fayrene Helper, MD sent at 10/09/2017  8:47 PM EST ----- Regarding: needs fasting labs in 3 months, 1 week befoire her visit pls I did not mention at visit or write on d/c summary, late entry, pls call and explain to her and mail order Fasting CBC, lipid, cmp and EGFr and hBA1C, and vit D thanks

## 2017-11-09 ENCOUNTER — Other Ambulatory Visit: Payer: Self-pay | Admitting: Family Medicine

## 2017-12-02 ENCOUNTER — Other Ambulatory Visit: Payer: Self-pay | Admitting: Family Medicine

## 2017-12-02 DIAGNOSIS — R7302 Impaired glucose tolerance (oral): Secondary | ICD-10-CM | POA: Diagnosis not present

## 2017-12-02 DIAGNOSIS — I10 Essential (primary) hypertension: Secondary | ICD-10-CM | POA: Diagnosis not present

## 2017-12-02 DIAGNOSIS — E559 Vitamin D deficiency, unspecified: Secondary | ICD-10-CM | POA: Diagnosis not present

## 2017-12-02 DIAGNOSIS — E785 Hyperlipidemia, unspecified: Secondary | ICD-10-CM | POA: Diagnosis not present

## 2017-12-03 LAB — COMPLETE METABOLIC PANEL WITH GFR
AG Ratio: 1.3 (calc) (ref 1.0–2.5)
ALBUMIN MSPROF: 4.4 g/dL (ref 3.6–5.1)
ALKALINE PHOSPHATASE (APISO): 57 U/L (ref 33–130)
ALT: 21 U/L (ref 6–29)
AST: 27 U/L (ref 10–35)
BUN / CREAT RATIO: 19 (calc) (ref 6–22)
BUN: 19 mg/dL (ref 7–25)
CO2: 35 mmol/L — AB (ref 20–32)
CREATININE: 1.02 mg/dL — AB (ref 0.60–0.88)
Calcium: 10.1 mg/dL (ref 8.6–10.4)
Chloride: 100 mmol/L (ref 98–110)
GFR, EST NON AFRICAN AMERICAN: 48 mL/min/{1.73_m2} — AB (ref >=60)
GFR, Est African American: 56 mL/min/{1.73_m2} — ABNORMAL LOW (ref >=60)
GLOBULIN: 3.3 g/dL (ref 1.9–3.7)
Glucose, Bld: 98 mg/dL (ref 65–99)
Potassium: 3.6 mmol/L (ref 3.5–5.3)
SODIUM: 139 mmol/L (ref 135–146)
Total Bilirubin: 0.5 mg/dL (ref 0.2–1.2)
Total Protein: 7.7 g/dL (ref 6.1–8.1)

## 2017-12-03 LAB — CBC
HCT: 35.7 % (ref 35.0–45.0)
Hemoglobin: 12.2 g/dL (ref 11.7–15.5)
MCH: 31.7 pg (ref 27.0–33.0)
MCHC: 34.2 g/dL (ref 32.0–36.0)
MCV: 92.7 fL (ref 80.0–100.0)
MPV: 10 fL (ref 7.5–12.5)
PLATELETS: 198 10*3/uL (ref 140–400)
RBC: 3.85 10*6/uL (ref 3.80–5.10)
RDW: 12.8 % (ref 11.0–15.0)
WBC: 3.5 10*3/uL — AB (ref 3.8–10.8)

## 2017-12-03 LAB — LIPID PANEL
CHOLESTEROL: 194 mg/dL (ref <200)
HDL: 83 mg/dL (ref >50)
LDL Cholesterol (Calc): 96 mg/dL (calc)
Non-HDL Cholesterol (Calc): 111 mg/dL (calc) (ref <130)
TRIGLYCERIDES: 65 mg/dL (ref <150)
Total CHOL/HDL Ratio: 2.3 (calc) (ref <5.0)

## 2017-12-03 LAB — VITAMIN D 25 HYDROXY (VIT D DEFICIENCY, FRACTURES): Vit D, 25-Hydroxy: 40 ng/mL (ref 30–100)

## 2017-12-03 LAB — HEMOGLOBIN A1C
HEMOGLOBIN A1C: 5.8 %{Hb} — AB (ref <5.7)
Mean Plasma Glucose: 120 (calc)
eAG (mmol/L): 6.6 (calc)

## 2017-12-07 ENCOUNTER — Ambulatory Visit (INDEPENDENT_AMBULATORY_CARE_PROVIDER_SITE_OTHER): Payer: Medicare Other | Admitting: Family Medicine

## 2017-12-07 ENCOUNTER — Ambulatory Visit (HOSPITAL_COMMUNITY)
Admission: RE | Admit: 2017-12-07 | Discharge: 2017-12-07 | Disposition: A | Discharge status: Home / Self Care | Payer: Medicare Other | Source: Ambulatory Visit | Attending: Family Medicine | Admitting: Family Medicine

## 2017-12-07 ENCOUNTER — Encounter: Payer: Self-pay | Admitting: Family Medicine

## 2017-12-07 VITALS — BP 142/80 | HR 66 | Resp 16 | Ht 65.0 in | Wt 160.0 lb

## 2017-12-07 DIAGNOSIS — R9389 Abnormal findings on diagnostic imaging of other specified body structures: Secondary | ICD-10-CM | POA: Insufficient documentation

## 2017-12-07 DIAGNOSIS — I10 Essential (primary) hypertension: Secondary | ICD-10-CM

## 2017-12-07 DIAGNOSIS — R0600 Dyspnea, unspecified: Secondary | ICD-10-CM | POA: Diagnosis not present

## 2017-12-07 DIAGNOSIS — Z1211 Encounter for screening for malignant neoplasm of colon: Secondary | ICD-10-CM | POA: Diagnosis not present

## 2017-12-07 DIAGNOSIS — R5383 Other fatigue: Secondary | ICD-10-CM | POA: Insufficient documentation

## 2017-12-07 DIAGNOSIS — E785 Hyperlipidemia, unspecified: Secondary | ICD-10-CM | POA: Diagnosis not present

## 2017-12-07 LAB — POC HEMOCCULT BLD/STL (OFFICE/1-CARD/DIAGNOSTIC): Fecal Occult Blood, POC: NEGATIVE

## 2017-12-07 NOTE — Assessment & Plan Note (Addendum)
6 month h/o worsening symptoms, no  abnormality to explain, will obtain CXR, and also refer to cardiology for further evaluation

## 2017-12-07 NOTE — Patient Instructions (Signed)
Please reschedule her f/u with me to end April, call if you need me before  No blood in stool and recent labs are very  Good  Please get CXR today  You are referred to the heart specialist to further evaluate your tiredness  Please stop eating foods that make your heartburn/ reflux symptoms worse.  Stop citalopram, you do not need this anymore, take one every other day of what you have with you , then no more  Hope you feel better soon

## 2017-12-13 NOTE — Assessment & Plan Note (Signed)
Hyperlipidemia:Low fat diet discussed and encouraged.   Lipid Panel  Lab Results  Component Value Date   CHOL 194 12/02/2017   HDL 83 12/02/2017   LDLCALC 77 03/03/2017   TRIG 65 12/02/2017   CHOLHDL 2.3 12/02/2017    Controlled, no change in medication

## 2017-12-13 NOTE — Progress Notes (Signed)
   Candace Cervantes     MRN: [id number]      DOB: 1925/03/19   HPI Ms. Meas is here for follow up and re-evaluation of chronic medical conditions, medication management and review of any available recent lab and radiology data.  .   Questions or concerns regarding consultations or procedures which the PT has had in the interim are  addressed. The PT denies any adverse reactions to current medications since the last visit.  6 month h/o excessive fatigue with minimal activity  feels as though this is out of proportion for her age  ROS Denies recent fever or chills. Denies sinus pressure, nasal congestion, ear pain or sore throat. Denies chest congestion, productive cough or wheezing. Denies chest pains, palpitations and leg swelling Denies abdominal pain, nausea, vomiting,diarrhea or constipation.   Denies dysuria, frequency, hesitancy or incontinence. c/o  joint pain, and limitation in mobility. Denies headaches, seizures, numbness, or tingling. Denies depression, anxiety or insomnia. Denies skin break down or rash.   PE  BP (!) 142/80   Pulse 66   Resp 16   Ht 5\' 5"  (1.651 m)   Wt 160 lb (72.6 kg)   SpO2 97%   BMI 26.63 kg/m   Patient alert and oriented and in no cardiopulmonary distress.  HEENT: No facial asymmetry, EOMI,   oropharynx pink and moist.  Neck supple no JVD, no mass.  Chest: Clear to auscultation bilaterally.  CVS: S1, S2 no murmurs, no S3.Regular rate.  ABD: Soft mild epigastric tenderness, no guarding or rebound, normal BS Rectal: heme negative stool  Ext: No edema  MS: Decreased ROM spine, shoulders, hips and knees.  Skin: Intact, no ulcerations or rash noted.  Psych: Good eye contact, normal affect. Memory intact not anxious or depressed appearing.  CNS: CN 2-12 intact, power,  normal throughout.no focal deficits noted.   Assessment & Plan  Fatigue 6 month h/o worsening symptoms, no  abnormality to explain, will obtain CXR, and also refer to  cardiology for further evaluation  Essential hypertension Controlled, no change in medication   Hyperlipemia Hyperlipidemia:Low fat diet discussed and encouraged.   Lipid Panel  Lab Results  Component Value Date   CHOL 194 12/02/2017   HDL 83 12/02/2017   LDLCALC 77 03/03/2017   TRIG 65 12/02/2017   CHOLHDL 2.3 12/02/2017    Controlled, no change in medication

## 2017-12-13 NOTE — Assessment & Plan Note (Signed)
Controlled, no change in medication  

## 2017-12-14 ENCOUNTER — Telehealth: Payer: Self-pay

## 2017-12-14 NOTE — Telephone Encounter (Signed)
x

## 2017-12-29 NOTE — Progress Notes (Signed)
Cardiology Office Note    Date:  12/30/2017   ID:  Marlie, Kuennen 1925/08/28, MRN [id number]  PCP:  Fayrene Helper, MD  Cardiologist: Cristopher Peru, MD    Chief Complaint  Patient presents with  . Follow-up    6 month visit; Worsening fatigue    History of Present Illness:    SHANTINA CHRONISTER is a 84 y.o. female with past medical history of CAD (nonobstructive disease by cath in 2005), PAF, symptomatic bradycardia (s/p initial placement in 2002 with gen change in 2007), HTN, and HLD who presents to the office today for 41-month follow-up.   She was last examined by Jory Sims, DNP in 06/2017 and reported overall doing well with no chest pain or dyspnea at that time. BP was well-controlled at 136/78 and she was continued on her current medication regimen.   She was recently evaluated by her PCP on 12/07/2017 and reported worsening dyspnea on exertion and fatigue over the past 6 months. A CXR was obtained and showed no active cardiopulmonary disease, therefore she was informed to follow-up with Cardiology for further evaluation.  In talking with the patient today, she reports to me having worsening fatigue over the past 6 months to a year. She lost her second husband last spring and reports having difficulty coping with this. She now lives by herself and is hopeful that one of her daughters will move in with her soon. Says she does not sleep well at night due to "her mind racing". Still performs ADL's independently and drives around town. She ambulates with a cane but denies any specific chest pain or dyspnea on exertion with activity.  No recent orthopnea, PND, or lower extremity edema. Reports weight has been stable on her home scales.   Past Medical History:  Diagnosis Date  . Anxiety disorder   . Arthritis   . CAD (coronary artery disease) 2005   Nonobstructive 40% RCA lesion and normal ejection fraction at cath   . GERD (gastroesophageal reflux disease)   .  Hyperlipidemia   . Hypertension   . Osteoporosis   . Pacemaker 2002   Permanent Placement  . Pacemaker 2007   Generator Changed   . Sinoatrial node dysfunction (HCC)   . SSS (sick sinus syndrome) ([facility])   . Zenker diverticulum    previously evaluated by Dr. Erik Obey in 6433.     Past Surgical History:  Procedure Laterality Date  . CHOLECYSTECTOMY    . COLONOSCOPY  2003   no polyps.  . CYSTECTOMY     from back of the neck  . ESOPHAGOGASTRODUODENOSCOPY  2008   Dr. Oneida Alar, difficulty passing scope through UES, incomplete fibrous ring at GEJ, hh, multiple benign gastri polyps  . ESOPHAGOGASTRODUODENOSCOPY N/A 10/17/2013   ZENKER'S, STRICTURE: 10-12.8 MM,  NSAID GASTRITIS FG POLYPS  . ESOPHAGOGASTRODUODENOSCOPY (EGD) WITH ESOPHAGEAL DILATION N/A 10/30/2013   SLF: 1. Zenkers diverticulum with a large opening at the cricopharyngeus 2. 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 Innumerable Fundic gland polyps 4. Mild NSAID gastritis  . MALONEY DILATION N/A 10/17/2013   Procedure: MALONEY DILATION;  Surgeon: Danie Binder, MD;  Location: [facility];  Service: Endoscopy;  Laterality: N/A;  with PEDS GASTROSCOPE  . PACEMAKER INSERTION  2002  . PACEMAKER PLACEMENT  2007   replacement   . SAVORY DILATION N/A 10/17/2013   Procedure: SAVORY DILATION;  Surgeon: Danie Binder, MD;  Location: [facility];  Service: Endoscopy;  Laterality: N/A;  with PEDS GASTROSCOPE  .  TOTAL ABDOMINAL HYSTERECTOMY  2002    Current Medications: Outpatient Medications Prior to Visit  Medication Sig Dispense Refill  . ACETAMINOPHEN EXTRA STRENGTH PO Take 650 mg by mouth daily as needed (pain).    . famotidine (PEPCID) 10 MG tablet Take 1 tablet (10 mg total) by mouth daily before supper. 30 tablet 5  . Multiple Vitamin (MULTIVITAMIN WITH MINERALS) TABS tablet Take 1 tablet by mouth daily.    . nitroGLYCERIN (NITROSTAT) 0.4 MG SL tablet Place 1 tablet (0.4 mg total) under the tongue every 5 (five) minutes as needed for  chest pain. One tablet under the tongue at onset of chest pains, repeat every five minutes as needed 25 tablet 3  . omeprazole (PRILOSEC) 20 MG capsule TAKE (1) CAPSULE BY MOUTH EVERY DAY. 90 capsule 1  . pravastatin (PRAVACHOL) 40 MG tablet TAKE 1 TABLET EVERY DAY. 90 tablet 1  . triamterene-hydrochlorothiazide (MAXZIDE-25) 37.5-25 MG tablet Take 1 tablet by mouth daily. 90 tablet 3   No facility-administered medications prior to visit.      Allergies:   Patient has no known allergies.   Social History   Socioeconomic History  . Marital status: Married    Spouse name: Not on file  . Number of children: 2  . Years of education: Not on file  . Highest education level: Not on file  Occupational History  . Not on file  Social Needs  . Financial resource strain: Not on file  . Food insecurity:    Worry: Not on file    Inability: Not on file  . Transportation needs:    Medical: Not on file    Non-medical: Not on file  Tobacco Use  . Smoking status: Never Smoker  . Smokeless tobacco: Never Used  Substance and Sexual Activity  . Alcohol use: No    Alcohol/week: 0.0 oz  . Drug use: No  . Sexual activity: Not Currently  Lifestyle  . Physical activity:    Days per week: Not on file    Minutes per session: Not on file  . Stress: Not on file  Relationships  . Social connections:    Talks on phone: Not on file    Gets together: Not on file    Attends religious service: Not on file    Active member of club or organization: Not on file    Attends meetings of clubs or organizations: Not on file    Relationship status: Not on file  Other Topics Concern  . Not on file  Social History Narrative  . Not on file     Family History:  The patient's family history includes Alcohol abuse in her brother; Cancer in her brother; Colon cancer in her other; Diabetes in her sister; Heart disease in her mother; Lung disease in her sister.   Review of Systems:   Please see the history of  present illness.     General:  No chills, fever, night sweats or weight changes. Positive for fatigue.  Cardiovascular:  No chest pain, dyspnea on exertion, edema, orthopnea, palpitations, paroxysmal nocturnal dyspnea. Dermatological: No rash, lesions/masses Respiratory: No cough, dyspnea Urologic: No hematuria, dysuria Abdominal:   No nausea, vomiting, diarrhea, bright red blood per rectum, melena, or hematemesis Neurologic:  No visual changes, wkns, changes in mental status. All other systems reviewed and are otherwise negative except as noted above.   Physical Exam:    VS:  BP (!) 142/78 (BP Location: Left Arm)   Pulse 68  Ht 5\' 9"  (1.753 m)   Wt 160 lb (72.6 kg)   SpO2 96%   BMI 23.63 kg/m    General: Well developed, elderly African American female appearing in no acute distress. Appears younger than stated age.  Head: Normocephalic, atraumatic, sclera non-icteric, no xanthomas, nares are without discharge.  Neck: No carotid bruits. JVD not elevated.  Lungs: Respirations regular and unlabored, without wheezes or rales.  Heart: Regular rate and rhythm. No S3 or S4.  No murmur, no rubs, or gallops appreciated. Abdomen: Soft, non-tender, non-distended with normoactive bowel sounds. No hepatomegaly. No rebound/guarding. No obvious abdominal masses. Msk:  Strength and tone appear normal for age. No joint deformities or effusions. Extremities: No clubbing or cyanosis. No lower extremity edema.  Distal pedal pulses are 2+ bilaterally. Neuro: Alert and oriented X 3. Moves all extremities spontaneously. No focal deficits noted. Psych:  Responds to questions appropriately with a normal affect. Skin: No rashes or lesions noted  Wt Readings from Last 3 Encounters:  12/30/17 160 lb (72.6 kg)  12/07/17 160 lb (72.6 kg)  10/07/17 159 lb (72.1 kg)     Studies/Labs Reviewed:   EKG:  EKG is ordered today. The EKG ordered today demonstrates normal sinus rhythm, heart rate 63 with  borderline first-degree AV block. No acute ST or T wave changes when compared to prior tracings.  Recent Labs: 02/20/2017: Magnesium 2.0 03/03/2017: TSH 3.80 12/02/2017: ALT 21; BUN 19; Creat 1.02; Hemoglobin 12.2; Platelets 198; Potassium 3.6; Sodium 139   Lipid Panel    Component Value Date/Time   CHOL 194 12/02/2017 1004   TRIG 65 12/02/2017 1004   HDL 83 12/02/2017 1004   CHOLHDL 2.3 12/02/2017 1004   VLDL 17 03/03/2017 1455   LDLCALC 96 12/02/2017 1004    Additional studies/ records that were reviewed today include:   Echocardiogram: 02/2017 Study Conclusions  - Left ventricle: The cavity size was normal. Wall thickness was   increased in a pattern of mild LVH. Systolic function was normal.   The estimated ejection fraction was in the range of 60% to 65%.   Wall motion was normal; there were no regional wall motion   abnormalities. Doppler parameters are consistent with abnormal   left ventricular relaxation (grade 1 diastolic dysfunction). - Aortic valve: Mildly calcified annulus. Trileaflet; mildly   calcified leaflets. There was trivial regurgitation. - Mitral valve: There was mild regurgitation. - Right ventricle: Pacer wire or catheter noted in right ventricle. - Atrial septum: No defect or patent foramen ovale was identified. - Tricuspid valve: There was trivial regurgitation. - Pulmonary arteries: PA peak pressure: 29 mm Hg (S). - Pericardium, extracardiac: There was no pericardial effusion.  Impressions:  - Mild LVH with LVEF 60-65% and grade 1 diastolic dysfunction. Mild   mitral regurgitation. Mildly sclerotic valve with trivial aortic   regurgitation. Device wire present within the right heart.   Trivial tricuspid regurgitation with PASP 29 mmHg.   Assessment:    1. PAF (paroxysmal atrial fibrillation) ([facility])   2. Chronic fatigue   3. Cardiac pacemaker in situ   4. Essential hypertension   5. Hyperlipidemia LDL goal <70      Plan:   In order  of problems listed above:  1. Paroxysmal Atrial Fibrillation - She denies any recent palpitations, lightheadedness, dizziness, or presyncope. Her most recent device interrogation showed less than 1% AF episodes. She is maintaining normal sinus rhythm by examination and EKG today.  - This patients CHA2DS2-VASc Score and unadjusted Ischemic  Stroke Rate (% per year) is equal to 7.2 % stroke rate/year from a score of 5 (HTN, Vascular, Female, Age (2)). Not on anticoagulation due to her advanced age and history of falls.  2. Chronic Fatigue - Reports this has been occurring for the past 6 months up to a year and she is not sleeping well due to being scared of staying by herself. She is not interested in moving to an ALF. - she told her PCP she had noticed worsening dyspnea on exertion but she tells me today that breathing has been stable. We discussed further evaluation with a repeat echocardiogram but she declines at this time. I asked her to make [place] aware if any of her symptoms change or she would like to proceed with further evaluation.  3. Symptomatic bradycardia - s/p initial placement in 2002 with gen change in 2007.  - Followed by Dr. Lovena Le. She is scheduled for a pacer check in 02/2018.  4. HTN - BP is initially elevated at 178/80 during today's visit, improved to 142/78 on recheck. - She reports being under increased stress as one of her daughters is flying in from out of town to visit this weekend. I have asked her to continue to follow blood pressure in the ambulatory setting. - By review of prior notes, she was switched from Triamterene-HCTZ to Losartan but Triamterene/HCTZ is listed on her current medication regimen. The patient is unsure which medication she is taking. I asked her to verify this upon returning home and to call the office so we can update her medication list.   5. HLD - Followed by PCP. FLP in 11/2017 showed total cholesterol 194, HDL 83, triglycerides 65, and LDL 96. -  Remains on Pravastatin 40 mg daily.    Medication Adjustments/Labs and Tests Ordered: Current medicines are reviewed at length with the patient today.  Concerns regarding medicines are outlined above.  Medication changes, Labs and Tests ordered today are listed in the Patient Instructions below. Patient Instructions  Medication Instructions:  Your physician recommends that you continue on your current medications as directed. Please refer to the Current Medication list given to you today.  Labwork: NONE   Testing/Procedures: NONE   Follow-Up: Your physician recommends that you schedule a follow-up appointment in: May with Dr. Lovena Le.   Any Other Special Instructions Will Be Listed Below (If Applicable).  Call office on Monday with name of Blood Pressure Medicine   If you need a refill on your cardiac medications before your next appointment, please call your pharmacy.  Thank you for choosing [facility]!    Signed, Erma Heritage, PA-C  12/30/2017 5:15 PM    [facility] [address] [place], [place] [address] Phone: [phone]

## 2017-12-30 ENCOUNTER — Encounter: Payer: Self-pay | Admitting: Student

## 2017-12-30 ENCOUNTER — Ambulatory Visit: Payer: Medicare Other | Admitting: Student

## 2017-12-30 VITALS — BP 142/78 | HR 68 | Ht 69.0 in | Wt 160.0 lb

## 2017-12-30 DIAGNOSIS — I10 Essential (primary) hypertension: Secondary | ICD-10-CM

## 2017-12-30 DIAGNOSIS — Z950 Presence of cardiac pacemaker: Secondary | ICD-10-CM | POA: Diagnosis not present

## 2017-12-30 DIAGNOSIS — R5382 Chronic fatigue, unspecified: Secondary | ICD-10-CM | POA: Diagnosis not present

## 2017-12-30 DIAGNOSIS — E785 Hyperlipidemia, unspecified: Secondary | ICD-10-CM

## 2017-12-30 DIAGNOSIS — I480 Paroxysmal atrial fibrillation: Secondary | ICD-10-CM

## 2017-12-30 NOTE — Patient Instructions (Signed)
Medication Instructions:  Your physician recommends that you continue on your current medications as directed. Please refer to the Current Medication list given to you today.   Labwork: NONE   Testing/Procedures: NONE   Follow-Up: Your physician recommends that you schedule a follow-up appointment in: May with Dr. Lovena Le.    Any Other Special Instructions Will Be Listed Below (If Applicable).  Call office on Monday with name of Blood Pressure Medicine    If you need a refill on your cardiac medications before your next appointment, please call your pharmacy.  Thank you for choosing [facility]!

## 2018-01-04 ENCOUNTER — Ambulatory Visit: Payer: Medicare Other | Admitting: Family Medicine

## 2018-01-11 ENCOUNTER — Telehealth: Payer: Self-pay | Admitting: Student

## 2018-01-11 MED ORDER — CITALOPRAM HYDROBROMIDE 10 MG PO TABS: 10.0000 mg | ORAL_TABLET | Freq: Every day | ORAL | 1 refills | Status: DC

## 2018-01-11 NOTE — Telephone Encounter (Signed)
Please give pt a call concerning her meds

## 2018-01-11 NOTE — Telephone Encounter (Signed)
Blood pressure med pt is taking is what is listed per Ovid Curd at [facility] 37.5-25 mg daily

## 2018-01-25 ENCOUNTER — Telehealth: Payer: Self-pay | Admitting: Family Medicine

## 2018-01-25 ENCOUNTER — Encounter: Payer: Self-pay | Admitting: Family Medicine

## 2018-01-25 ENCOUNTER — Ambulatory Visit (INDEPENDENT_AMBULATORY_CARE_PROVIDER_SITE_OTHER): Payer: Medicare Other | Admitting: Family Medicine

## 2018-01-25 ENCOUNTER — Ambulatory Visit (HOSPITAL_COMMUNITY)
Admission: RE | Admit: 2018-01-25 | Discharge: 2018-01-25 | Disposition: A | Discharge status: Home / Self Care | Payer: Medicare Other | Source: Ambulatory Visit | Attending: Family Medicine | Admitting: Family Medicine

## 2018-01-25 DIAGNOSIS — R1084 Generalized abdominal pain: Secondary | ICD-10-CM | POA: Diagnosis not present

## 2018-01-25 DIAGNOSIS — N3001 Acute cystitis with hematuria: Secondary | ICD-10-CM | POA: Diagnosis not present

## 2018-01-25 DIAGNOSIS — R2681 Unsteadiness on feet: Secondary | ICD-10-CM | POA: Diagnosis not present

## 2018-01-25 DIAGNOSIS — G3184 Mild cognitive impairment, so stated: Secondary | ICD-10-CM

## 2018-01-25 DIAGNOSIS — F411 Generalized anxiety disorder: Secondary | ICD-10-CM

## 2018-01-25 DIAGNOSIS — S3992XA Unspecified injury of lower back, initial encounter: Secondary | ICD-10-CM

## 2018-01-25 DIAGNOSIS — I10 Essential (primary) hypertension: Secondary | ICD-10-CM

## 2018-01-25 DIAGNOSIS — K5900 Constipation, unspecified: Secondary | ICD-10-CM | POA: Diagnosis not present

## 2018-01-25 DIAGNOSIS — M549 Dorsalgia, unspecified: Secondary | ICD-10-CM

## 2018-01-25 NOTE — Telephone Encounter (Signed)
Noted and I totally agree
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
## 2018-01-25 NOTE — Telephone Encounter (Signed)
fyi

## 2018-01-26 DIAGNOSIS — I10 Essential (primary) hypertension: Secondary | ICD-10-CM | POA: Diagnosis not present

## 2018-01-26 DIAGNOSIS — N3001 Acute cystitis with hematuria: Secondary | ICD-10-CM | POA: Diagnosis not present

## 2018-01-26 LAB — CBC
HCT: 34.3 % — ABNORMAL LOW (ref 35.0–45.0)
Hemoglobin: 11.8 g/dL (ref 11.7–15.5)
MCH: 31.3 pg (ref 27.0–33.0)
MCHC: 34.4 g/dL (ref 32.0–36.0)
MCV: 91 fL (ref 80.0–100.0)
MPV: 10.3 fL (ref 7.5–12.5)
PLATELETS: 191 10*3/uL (ref 140–400)
RBC: 3.77 10*6/uL — ABNORMAL LOW (ref 3.80–5.10)
RDW: 12.9 % (ref 11.0–15.0)
WBC: 4.6 10*3/uL (ref 3.8–10.8)

## 2018-01-26 LAB — POCT URINALYSIS DIPSTICK
BILIRUBIN UA: NEGATIVE
GLUCOSE UA: NEGATIVE
KETONES UA: NEGATIVE
Nitrite, UA: NEGATIVE
Odor: NORMAL
Protein, UA: NEGATIVE
SPEC GRAV UA: 1.02 (ref 1.010–1.025)
Urobilinogen, UA: 1 E.U./dL
pH, UA: 7 (ref 5.0–8.0)

## 2018-01-26 LAB — BASIC METABOLIC PANEL WITH GFR
BUN/Creatinine Ratio: 20 (calc) (ref 6–22)
BUN: 24 mg/dL (ref 7–25)
CALCIUM: 9.7 mg/dL (ref 8.6–10.4)
CHLORIDE: 101 mmol/L (ref 98–110)
CO2: 31 mmol/L (ref 20–32)
Creat: 1.19 mg/dL — ABNORMAL HIGH (ref 0.60–0.88)
GFR, EST AFRICAN AMERICAN: 47 mL/min/{1.73_m2} — AB (ref >=60)
GFR, EST NON AFRICAN AMERICAN: 40 mL/min/{1.73_m2} — AB (ref >=60)
Glucose, Bld: 93 mg/dL (ref 65–139)
POTASSIUM: 3.3 mmol/L — AB (ref 3.5–5.3)
SODIUM: 139 mmol/L (ref 135–146)

## 2018-01-27 ENCOUNTER — Ambulatory Visit: Payer: Medicare Other | Admitting: Family Medicine

## 2018-01-27 ENCOUNTER — Other Ambulatory Visit: Payer: Self-pay | Admitting: Family Medicine

## 2018-01-27 LAB — URINE CULTURE
MICRO NUMBER: 90503066
SPECIMEN QUALITY: ADEQUATE

## 2018-01-27 MED ORDER — POTASSIUM CHLORIDE ER 10 MEQ PO TBCR: 10.0000 meq | EXTENDED_RELEASE_TABLET | Freq: Two times a day (BID) | ORAL | 0 refills | Status: DC

## 2018-01-30 ENCOUNTER — Encounter: Payer: Self-pay | Admitting: Family Medicine

## 2018-01-30 DIAGNOSIS — M549 Dorsalgia, unspecified: Secondary | ICD-10-CM | POA: Insufficient documentation

## 2018-01-30 DIAGNOSIS — S3992XA Unspecified injury of lower back, initial encounter: Secondary | ICD-10-CM

## 2018-01-30 DIAGNOSIS — R2681 Unsteadiness on feet: Secondary | ICD-10-CM | POA: Insufficient documentation
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
## 2018-01-30 NOTE — Assessment & Plan Note (Signed)
MMSE on 01/2018 is 27/30
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
## 2018-01-30 NOTE — Assessment & Plan Note (Signed)
Controlled, no change in medication  

## 2018-01-30 NOTE — Assessment & Plan Note (Signed)
Resolved, no longer requires medication
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
## 2018-02-08 ENCOUNTER — Telehealth: Payer: Self-pay | Admitting: Family Medicine

## 2018-02-08 NOTE — Telephone Encounter (Signed)
Pt icalled and LVM for the nurse to call

## 2018-02-14 ENCOUNTER — Emergency Department (HOSPITAL_COMMUNITY)
Admission: EM | Admit: 2018-02-14 | Discharge: 2018-02-14 | Disposition: A | Discharge status: Home / Self Care | Payer: Medicare Other | Attending: Emergency Medicine | Admitting: Emergency Medicine

## 2018-02-14 ENCOUNTER — Emergency Department (HOSPITAL_COMMUNITY): Payer: Medicare Other

## 2018-02-14 ENCOUNTER — Other Ambulatory Visit: Payer: Self-pay

## 2018-02-14 ENCOUNTER — Encounter (HOSPITAL_COMMUNITY): Payer: Self-pay | Admitting: Emergency Medicine

## 2018-02-14 DIAGNOSIS — E785 Hyperlipidemia, unspecified: Secondary | ICD-10-CM | POA: Insufficient documentation

## 2018-02-14 DIAGNOSIS — I10 Essential (primary) hypertension: Secondary | ICD-10-CM | POA: Diagnosis not present

## 2018-02-14 DIAGNOSIS — I2510 Atherosclerotic heart disease of native coronary artery without angina pectoris: Secondary | ICD-10-CM | POA: Diagnosis not present

## 2018-02-14 DIAGNOSIS — R0781 Pleurodynia: Secondary | ICD-10-CM | POA: Diagnosis not present

## 2018-02-14 DIAGNOSIS — I4891 Unspecified atrial fibrillation: Secondary | ICD-10-CM | POA: Insufficient documentation

## 2018-02-14 DIAGNOSIS — Z79899 Other long term (current) drug therapy: Secondary | ICD-10-CM | POA: Insufficient documentation

## 2018-02-14 DIAGNOSIS — Z950 Presence of cardiac pacemaker: Secondary | ICD-10-CM | POA: Diagnosis not present

## 2018-02-14 DIAGNOSIS — R51 Headache: Secondary | ICD-10-CM | POA: Diagnosis not present

## 2018-02-14 DIAGNOSIS — R0789 Other chest pain: Secondary | ICD-10-CM | POA: Diagnosis not present

## 2018-02-14 DIAGNOSIS — E876 Hypokalemia: Secondary | ICD-10-CM | POA: Insufficient documentation

## 2018-02-14 LAB — BASIC METABOLIC PANEL
ANION GAP: 9 (ref 5–15)
BUN: 19 mg/dL (ref 6–20)
CALCIUM: 9.7 mg/dL (ref 8.9–10.3)
CHLORIDE: 99 mmol/L — AB (ref 101–111)
CO2: 31 mmol/L (ref 22–32)
Creatinine, Ser: 0.97 mg/dL (ref 0.44–1.00)
GFR calc non Af Amer: 50 mL/min — ABNORMAL LOW (ref >60)
GFR, EST AFRICAN AMERICAN: 58 mL/min — AB (ref >60)
GLUCOSE: 91 mg/dL (ref 65–99)
POTASSIUM: 3.4 mmol/L — AB (ref 3.5–5.1)
Sodium: 139 mmol/L (ref 135–145)

## 2018-02-14 LAB — CBC WITH DIFFERENTIAL/PLATELET
BASOS ABS: 0 10*3/uL (ref 0.0–0.1)
BASOS PCT: 0 %
Eosinophils Absolute: 0.1 10*3/uL (ref 0.0–0.7)
Eosinophils Relative: 2 %
HEMATOCRIT: 34.4 % — AB (ref 36.0–46.0)
HEMOGLOBIN: 11.3 g/dL — AB (ref 12.0–15.0)
Lymphocytes Relative: 46 %
Lymphs Abs: 1.9 10*3/uL (ref 0.7–4.0)
MCH: 31 pg (ref 26.0–34.0)
MCHC: 32.8 g/dL (ref 30.0–36.0)
MCV: 94.5 fL (ref 78.0–100.0)
Monocytes Absolute: 0.3 10*3/uL (ref 0.1–1.0)
Monocytes Relative: 8 %
NEUTROS ABS: 1.8 10*3/uL (ref 1.7–7.7)
NEUTROS PCT: 44 %
Platelets: 183 10*3/uL (ref 150–400)
RBC: 3.64 MIL/uL — AB (ref 3.87–5.11)
RDW: 13.6 % (ref 11.5–15.5)
WBC: 4.1 10*3/uL (ref 4.0–10.5)

## 2018-02-14 LAB — URINALYSIS, ROUTINE W REFLEX MICROSCOPIC
BILIRUBIN URINE: NEGATIVE
Glucose, UA: NEGATIVE mg/dL
Hgb urine dipstick: NEGATIVE
KETONES UR: NEGATIVE mg/dL
NITRITE: NEGATIVE
PH: 7 (ref 5.0–8.0)
PROTEIN: NEGATIVE mg/dL
Specific Gravity, Urine: 1.008 (ref 1.005–1.030)

## 2018-02-14 LAB — TROPONIN I

## 2018-02-14 MED ORDER — POTASSIUM CHLORIDE CRYS ER 20 MEQ PO TBCR
40.0000 meq | EXTENDED_RELEASE_TABLET | Freq: Once | ORAL | Status: AC
  Administered 2018-02-14: 40 meq via ORAL
  Filled 2018-02-14: qty 2

## 2018-02-14 MED ORDER — ACETAMINOPHEN 325 MG PO TABS
325.0000 mg | ORAL_TABLET | Freq: Once | ORAL | Status: AC
  Administered 2018-02-14: 325 mg via ORAL
  Filled 2018-02-14: qty 1

## 2018-02-14 NOTE — ED Notes (Signed)
ED Provider at bedside. 

## 2018-02-14 NOTE — ED Triage Notes (Signed)
Patient complaining of bilateral flank pain for greater than 2 weeks, abdominal pain and headache starting today.

## 2018-02-14 NOTE — Telephone Encounter (Signed)
No other info was left. Called back and no answer
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
## 2018-02-14 NOTE — Discharge Instructions (Addendum)
Can take Tylenol 325 mg every 4 hours as needed for pain.  Call Dr. Griffin Dakin office tomorrow to schedule appointment for within a week.  Your blood pressure should be rechecked at Dr. Griffin Dakin office today's was elevated at 166/80

## 2018-02-15 DIAGNOSIS — R2681 Unsteadiness on feet: Secondary | ICD-10-CM

## 2018-02-23 ENCOUNTER — Ambulatory Visit (INDEPENDENT_AMBULATORY_CARE_PROVIDER_SITE_OTHER): Payer: Medicare Other | Admitting: Family Medicine

## 2018-02-23 ENCOUNTER — Encounter: Payer: Self-pay | Admitting: Family Medicine

## 2018-02-23 VITALS — BP 122/74 | HR 61 | Resp 16 | Ht 65.0 in | Wt 153.0 lb

## 2018-02-23 DIAGNOSIS — F411 Generalized anxiety disorder: Secondary | ICD-10-CM | POA: Diagnosis not present

## 2018-02-23 DIAGNOSIS — R2681 Unsteadiness on feet: Secondary | ICD-10-CM

## 2018-02-23 DIAGNOSIS — Z09 Encounter for follow-up examination after completed treatment for conditions other than malignant neoplasm: Secondary | ICD-10-CM

## 2018-02-23 DIAGNOSIS — I10 Essential (primary) hypertension: Secondary | ICD-10-CM

## 2018-02-23 MED ORDER — CITALOPRAM HYDROBROMIDE 10 MG PO TABS: 10.0000 mg | ORAL_TABLET | Freq: Every day | ORAL | 5 refills | Status: DC

## 2018-02-23 NOTE — Patient Instructions (Addendum)
Chancel June 10 f/u appt and change to 5 weeks    Please resume the citalopram as before, this will help anxiety, shakes, depression   We need to see why no physical therapy has been started and get this done.  We will try to see how to get you to senior center in [facility], please go today and ask questiuons and get back to me today with your findings  pls call me / drop off a note , Margot Ables ( daughter) [phone] cell

## 2018-02-24 ENCOUNTER — Telehealth: Payer: Self-pay | Admitting: Family Medicine

## 2018-02-24 NOTE — Telephone Encounter (Signed)
Daughter is calling in to advise that they went by regarding Senior Events, and her Mom will participate--thanks

## 2018-02-25 NOTE — Telephone Encounter (Signed)
Wonderful- noted

## 2018-03-04 ENCOUNTER — Ambulatory Visit: Payer: Medicare Other | Admitting: Internal Medicine

## 2018-03-04 ENCOUNTER — Encounter: Payer: Self-pay | Admitting: Internal Medicine

## 2018-03-04 VITALS — BP 160/78 | HR 71 | Ht 68.0 in | Wt 155.0 lb

## 2018-03-04 DIAGNOSIS — I480 Paroxysmal atrial fibrillation: Secondary | ICD-10-CM

## 2018-03-04 DIAGNOSIS — I10 Essential (primary) hypertension: Secondary | ICD-10-CM | POA: Diagnosis not present

## 2018-03-04 DIAGNOSIS — Z950 Presence of cardiac pacemaker: Secondary | ICD-10-CM | POA: Diagnosis not present

## 2018-03-04 NOTE — Progress Notes (Signed)
HPI Candace Cervantes returns today for followup of her PPM, sinus node dysfunction, s/p PPM. She has been sad after losing her husband. He had dementia. No Known Allergies   Current Outpatient Medications  Medication Sig Dispense Refill  . ACETAMINOPHEN EXTRA STRENGTH PO Take 650 mg by mouth daily as needed (pain).    . citalopram (CELEXA) 10 MG tablet Take 1 tablet (10 mg total) by mouth daily. 30 tablet 5  . famotidine (PEPCID) 10 MG tablet Take 1 tablet (10 mg total) by mouth daily before supper. 30 tablet 5  . Multiple Vitamin (MULTIVITAMIN WITH MINERALS) TABS tablet Take 1 tablet by mouth daily.    . nitroGLYCERIN (NITROSTAT) 0.4 MG SL tablet Place 1 tablet (0.4 mg total) under the tongue every 5 (five) minutes as needed for chest pain. One tablet under the tongue at onset of chest pains, repeat every five minutes as needed 25 tablet 3  . omeprazole (PRILOSEC) 20 MG capsule TAKE (1) CAPSULE BY MOUTH EVERY DAY. 90 capsule 1  . potassium chloride (K-DUR) 10 MEQ tablet Take 1 tablet (10 mEq total) by mouth 2 (two) times daily. 10 tablet 0  . pravastatin (PRAVACHOL) 40 MG tablet TAKE 1 TABLET EVERY DAY. 90 tablet 1  . triamterene-hydrochlorothiazide (MAXZIDE-25) 37.5-25 MG tablet Take 1 tablet by mouth daily. 90 tablet 3   No current facility-administered medications for this visit.      Past Medical History:  Diagnosis Date  . Anxiety disorder   . Arthritis   . CAD (coronary artery disease) 2005   Nonobstructive 40% RCA lesion and normal ejection fraction at cath   . GERD (gastroesophageal reflux disease)   . Hyperlipidemia   . Hypertension   . Osteoporosis   . Pacemaker 2002   Permanent Placement  . Pacemaker 2007   Generator Changed   . Sinoatrial node dysfunction (HCC)   . SSS (sick sinus syndrome) ([place])   . Zenker diverticulum    previously evaluated by Dr. Erik Obey in 2130.     ROS:   All systems reviewed and negative except as noted in the HPI.   Past Surgical  History:  Procedure Laterality Date  . CHOLECYSTECTOMY    . COLONOSCOPY  2003   no polyps.  . CYSTECTOMY     from back of the neck  . ESOPHAGOGASTRODUODENOSCOPY  2008   Dr. Oneida Alar, difficulty passing scope through UES, incomplete fibrous ring at GEJ, hh, multiple benign gastri polyps  . ESOPHAGOGASTRODUODENOSCOPY N/A 10/17/2013   ZENKER'S, STRICTURE: 10-12.8 MM,  NSAID GASTRITIS FG POLYPS  . ESOPHAGOGASTRODUODENOSCOPY (EGD) WITH ESOPHAGEAL DILATION N/A 10/30/2013   SLF: 1. Zenkers diverticulum with a large opening at the cricopharyngeus 2. 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 Innumerable Fundic gland polyps 4. Mild NSAID gastritis  . MALONEY DILATION N/A 10/17/2013   Procedure: MALONEY DILATION;  Surgeon: Danie Binder, MD;  [facility];  Service: Endoscopy;  Laterality: N/A;  with PEDS GASTROSCOPE  . PACEMAKER INSERTION  2002  . PACEMAKER PLACEMENT  2007   replacement   . SAVORY DILATION N/A 10/17/2013   Procedure: SAVORY DILATION;  Surgeon: Danie Binder, MD;  [facility];  Service: Endoscopy;  Laterality: N/A;  with PEDS GASTROSCOPE  . TOTAL ABDOMINAL HYSTERECTOMY  2002     Family History  Problem Relation Age of Onset  . Heart disease Mother   . Lung disease Sister   . Alcohol abuse Brother   . Cancer Brother  possible prostate  . Diabetes Sister   . Colon cancer Other        sibling, age 30     Social History   Socioeconomic History  . Marital status: Married    Spouse name: Not on file  . Number of children: 2  . Years of education: Not on file  . Highest education level: Not on file  Occupational History  . Not on file  Social Needs  . Financial resource strain: Not on file  . Food insecurity:    Worry: Not on file    Inability: Not on file  . Transportation needs:    Medical: Not on file    Non-medical: Not on file  Tobacco Use  . Smoking status: Never Smoker  . Smokeless tobacco: Never Used  Substance and Sexual Activity    . Alcohol use: No    Alcohol/week: 0.0 oz  . Drug use: No  . Sexual activity: Not Currently  Lifestyle  . Physical activity:    Days per week: Not on file    Minutes per session: Not on file  . Stress: Not on file  Relationships  . Social connections:    Talks on phone: Not on file    Gets together: Not on file    Attends religious service: Not on file    Active member of club or organization: Not on file    Attends meetings of clubs or organizations: Not on file    Relationship status: Not on file  . Intimate partner violence:    Fear of current or ex partner: Not on file    Emotionally abused: Not on file    Physically abused: Not on file    Forced sexual activity: Not on file  Other Topics Concern  . Not on file  Social History Narrative  . Not on file     BP (!) 160/78   Pulse 71   Ht 5\' 8"  (1.727 m)   Wt 155 lb (70.3 kg)   SpO2 99% Comment: on room air  BMI 23.57 kg/m   Physical Exam:  Well appearing 84 yo woman, NAD HEENT: Unremarkable Neck:  6 cm JVD, no thyromegally Lymphatics:  No adenopathy Back:  No CVA tenderness Lungs:  Clear with no wheezes HEART:  Regular rate rhythm, no murmurs, no rubs, no clicks Abd:  soft, positive bowel sounds, no organomegally, no rebound, no guarding Ext:  2 plus pulses, no edema, no cyanosis, no clubbing Skin:  No rashes no nodules Neuro:  CN II through XII intact, motor grossly intact  DEVICE  Normal device function.  See PaceArt for details.   Assess/Plan: 1. Sinus node dysfunction - she is asyptomatic, s/p PPM insertion. 2. HTN - I considered adding more medication today but her daughter tells me she was ast her pirmary md's office and her pressure was normal. 3. PPM - her [employer] DDD PM is working normally.  4. Atrial arrhythmias - she has brief episodes. I do not think she has enough to recommend systemic anti-coagulation.  Mikle Bosworth.D.

## 2018-03-04 NOTE — Patient Instructions (Signed)
Medication Instructions:  Your physician recommends that you continue on your current medications as directed. Please refer to the Current Medication list given to you today.   Labwork: NONE   Testing/Procedures: NONE   Follow-Up: Your physician wants you to follow-up in: 1 Year with Dr. Taylor. You will receive a reminder letter in the mail two months in advance. If you don't receive a letter, please call our office to schedule the follow-up appointment.   Any Other Special Instructions Will Be Listed Below (If Applicable).     If you need a refill on your cardiac medications before your next appointment, please call your pharmacy.  Thank you for choosing [facility]!   

## 2018-03-09 ENCOUNTER — Encounter: Payer: Self-pay | Admitting: Gastroenterology

## 2018-03-14 ENCOUNTER — Ambulatory Visit: Payer: Medicare Other | Admitting: Family Medicine

## 2018-03-17 ENCOUNTER — Telehealth: Payer: Self-pay | Admitting: Family Medicine

## 2018-03-17 NOTE — Telephone Encounter (Signed)
CB#[id number]/ [phone] (Daughter-Rosalyn)  Called to check the status of FMLA paperwork she dropped off May 23rd

## 2018-03-18 NOTE — Telephone Encounter (Signed)
pls let her know will be ready for her to collect on Monday, sorry for long delay

## 2018-03-21 ENCOUNTER — Encounter: Payer: Self-pay | Admitting: Family Medicine

## 2018-03-21 ENCOUNTER — Telehealth: Payer: Self-pay | Admitting: Family Medicine

## 2018-03-21 NOTE — Telephone Encounter (Signed)
Form is in the fax box. Please call the number on the envelope for daughter to collect
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
## 2018-03-21 NOTE — Telephone Encounter (Signed)
Called [phone] (no answer)  & [phone] left msg with ms.Yamina Stankey that paperwork was ready to be picked up

## 2018-03-21 NOTE — Assessment & Plan Note (Addendum)
Controlled, no change in medication, generally controlled, has not taken meds this morning DASH diet and commitment to daily physical activity for a minimum of 30 minutes discussed and encouraged, as a part of hypertension management. The importance of attaining a healthy weight is also discussed.  BP/Weight 03/04/2018 02/23/2018 02/14/2018 01/25/2018 12/30/2017 0/10/402 02/11/1367  Systolic BP 599 234 144 360 165 800 634  Diastolic BP 78 74 72 70 78 80 80  Wt. (Lbs) 155 153 159 159 160 160 159  BMI 23.57 25.46 26.46 26.46 23.63 26.63 26.46
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
## 2018-03-22 DIAGNOSIS — I10 Essential (primary) hypertension: Secondary | ICD-10-CM

## 2018-03-22 NOTE — Telephone Encounter (Signed)
Candace Cervantes --is aware the forms are ready --she will be by the office on thursday

## 2018-03-29 ENCOUNTER — Telehealth: Payer: Self-pay

## 2018-03-29 NOTE — Telephone Encounter (Signed)
Amy called to make you aware that when she was entering her medications on file that it came up a drug interaction between omeprazole and citalopram and she wanted to make sure you wanted her on both together. Please advise  [phone]

## 2018-03-30 ENCOUNTER — Telehealth: Payer: Self-pay | Admitting: Family Medicine

## 2018-03-30 LAB — CUP PACEART INCLINIC DEVICE CHECK
Implantable Lead Implant Date: 20021126
Implantable Lead Implant Date: 20021126
Implantable Pulse Generator Implant Date: 20070926
MDC IDC LEAD LOCATION: 753859
MDC IDC LEAD LOCATION: 753860
MDC IDC SESS DTM: 20190626100435
Pulse Gen Serial Number: 1794510

## 2018-03-30 NOTE — Telephone Encounter (Signed)
Candace Cervantes from [facility] is requesting for you to call her back regarding patient Cb# [phone]/ [phone]

## 2018-03-30 NOTE — Telephone Encounter (Signed)
Spoke with Judson Roch- she stopped by the office

## 2018-03-31 ENCOUNTER — Ambulatory Visit (INDEPENDENT_AMBULATORY_CARE_PROVIDER_SITE_OTHER): Payer: Medicare Other | Admitting: Family Medicine

## 2018-03-31 ENCOUNTER — Encounter: Payer: Self-pay | Admitting: Family Medicine

## 2018-03-31 VITALS — BP 160/78 | HR 63 | Resp 16 | Ht 68.0 in | Wt 154.0 lb

## 2018-03-31 DIAGNOSIS — E785 Hyperlipidemia, unspecified: Secondary | ICD-10-CM

## 2018-03-31 DIAGNOSIS — R2681 Unsteadiness on feet: Secondary | ICD-10-CM

## 2018-03-31 DIAGNOSIS — Z1231 Encounter for screening mammogram for malignant neoplasm of breast: Secondary | ICD-10-CM | POA: Diagnosis not present

## 2018-03-31 DIAGNOSIS — I10 Essential (primary) hypertension: Secondary | ICD-10-CM

## 2018-03-31 DIAGNOSIS — Z1331 Encounter for screening for depression: Secondary | ICD-10-CM

## 2018-03-31 DIAGNOSIS — F411 Generalized anxiety disorder: Secondary | ICD-10-CM

## 2018-03-31 MED ORDER — UNABLE TO FIND: 0 refills | Status: DC

## 2018-03-31 NOTE — Telephone Encounter (Signed)
Amy aware.

## 2018-03-31 NOTE — Patient Instructions (Addendum)
Wellness with nurse in October 9 or after   Please schedule a mammogram at checkout, PT [place] F/U with MD in mid December, call if you need me sooner  We will prescribe clawed cane  You are doing better, family supportive and you are busy  Use cane and be careful not to fall  Eat regulalrly  Thank you  for choosing [facility]. We consider it a privelige to serve you.  Delivering excellent health care in a caring and  compassionate way is our goal.  Partnering with you,  so that together we can achieve this goal is our strategy.

## 2018-03-31 NOTE — Telephone Encounter (Signed)
Yes stay on both

## 2018-03-31 NOTE — Progress Notes (Signed)
   Candace Cervantes     MRN: [id number]      DOB: [birth year]   HPI Candace Cervantes is here for follow up and re-evaluation of chronic medical conditions, medication management and review of any available recent lab and radiology data.   The PT denies any adverse reactions to current medications since the last visit.  Increased activity I the home, she is cleaning uip her home, and her family, daughter, son in law, grand children and great grand children all live nearby and are involved. Though she would  Enjoy the senior center it is not feasible to get her there. Physical therapy is coming in. She requests a clawed cane   ROS Denies recent fever or chills. Denies sinus pressure, nasal congestion, ear pain or sore throat. Denies chest congestion, productive cough or wheezing. Denies chest pains, palpitations and leg swelling Denies abdominal pain, nausea, vomiting,diarrhea or constipation.   Denies dysuria, frequency, hesitancy or incontinence. c/o joint pain, swelling and limitation in mobility. Denies headaches, seizures, numbness, or tingling. Denies uncontrolled  depression, anxiety or insomnia.Improved with family involvement and adjustment to the fact that she is no longer freely mobile Denies skin break down or rash.   PE  BP (!) 160/78   Pulse 63   Resp 16   Ht 5\' 8"  (1.727 m)   Wt 154 lb (69.9 kg)   SpO2 96%   BMI 23.42 kg/m   Patient alert and oriented and in no cardiopulmonary distress.  HEENT: No facial asymmetry, EOMI,   oropharynx pink and moist.  Neck supple no JVD, no mass.  Chest: Clear to auscultation bilaterally.  CVS: S1, S2 no murmurs, no S3.Regular rate.  ABD: Soft non tender.   Ext: No edema  MS: decreased ROM spine, shoulders, hips and knees.  Skin: Intact, no ulcerations or rash noted.  Psych: Good eye contact, normal affect. Memory impaired  not anxious or depressed appearing.  CNS: CN 2-12 intact, power,  normal throughout.no focal deficits  noted.   Assessment & Plan Essential hypertension Sub optimal though adequate based on patient's age and fall risk, no medication adjustment at this time Encouraged to reduce salt inmtake and increase fruit and vegetable intake  Unsteady gait Reciving pT in home x 6 weeks which will be beneficial, and script for clawed cane is also written Home safety is discussed also  GAD (generalized anxiety disorder) Still reports intermittent tremor , less however, and I associate this to anxiety, her thyroid function test is normal  Hyperlipemia Hyperlipidemia:Low fat diet discussed and encouraged.   Lipid Panel  Lab Results  Component Value Date   CHOL 194 12/02/2017   HDL 83 12/02/2017   LDLCALC 96 12/02/2017   TRIG 65 12/02/2017   CHOLHDL 2.3 12/02/2017   Controlled, no change in medication     Screening for depression Depression screen shows improvement from prior test with a score of 11, her main issue is that of loss of independence , however she is graciously settling into this , and has very good family support, hopefully she will be allowed to enjoy a part of senior adult life also

## 2018-04-04 ENCOUNTER — Encounter: Payer: Self-pay | Admitting: Family Medicine

## 2018-04-04 DIAGNOSIS — M1990 Unspecified osteoarthritis, unspecified site: Secondary | ICD-10-CM | POA: Diagnosis not present

## 2018-04-04 DIAGNOSIS — I10 Essential (primary) hypertension: Secondary | ICD-10-CM | POA: Diagnosis not present

## 2018-04-04 DIAGNOSIS — R2681 Unsteadiness on feet: Secondary | ICD-10-CM | POA: Diagnosis not present

## 2018-04-04 DIAGNOSIS — R296 Repeated falls: Secondary | ICD-10-CM | POA: Diagnosis not present

## 2018-04-04 DIAGNOSIS — Z1331 Encounter for screening for depression: Secondary | ICD-10-CM | POA: Insufficient documentation

## 2018-04-04 NOTE — Assessment & Plan Note (Signed)
Sub optimal though adequate based on patient's age and fall risk, no medication adjustment at this time Encouraged to reduce salt inmtake and increase fruit and vegetable intake

## 2018-04-04 NOTE — Assessment & Plan Note (Signed)
Still reports intermittent tremor , less however, and I associate this to anxiety, her thyroid function test is normal

## 2018-04-04 NOTE — Assessment & Plan Note (Signed)
Hyperlipidemia:Low fat diet discussed and encouraged.   Lipid Panel  Lab Results  Component Value Date   CHOL 194 12/02/2017   HDL 83 12/02/2017   LDLCALC 96 12/02/2017   TRIG 65 12/02/2017   CHOLHDL 2.3 12/02/2017   Controlled, no change in medication

## 2018-04-04 NOTE — Assessment & Plan Note (Signed)
Reciving pT in home x 6 weeks which will be beneficial, and script for clawed cane is also written Home safety is discussed also

## 2018-04-04 NOTE — Assessment & Plan Note (Signed)
Depression screen shows improvement from prior test with a score of 11, her main issue is that of loss of independence , however she is graciously settling into this , and has very good family support, hopefully she will be allowed to enjoy a part of senior adult life also

## 2018-04-06 DIAGNOSIS — R2681 Unsteadiness on feet: Secondary | ICD-10-CM | POA: Diagnosis not present

## 2018-04-06 DIAGNOSIS — M1990 Unspecified osteoarthritis, unspecified site: Secondary | ICD-10-CM | POA: Diagnosis not present

## 2018-04-06 DIAGNOSIS — R296 Repeated falls: Secondary | ICD-10-CM | POA: Diagnosis not present

## 2018-04-06 DIAGNOSIS — I10 Essential (primary) hypertension: Secondary | ICD-10-CM | POA: Diagnosis not present

## 2018-04-11 ENCOUNTER — Other Ambulatory Visit: Payer: Self-pay | Admitting: Adult Health

## 2018-04-11 DIAGNOSIS — R296 Repeated falls: Secondary | ICD-10-CM | POA: Diagnosis not present

## 2018-04-11 DIAGNOSIS — M1990 Unspecified osteoarthritis, unspecified site: Secondary | ICD-10-CM | POA: Diagnosis not present

## 2018-04-11 DIAGNOSIS — I10 Essential (primary) hypertension: Secondary | ICD-10-CM | POA: Diagnosis not present

## 2018-04-11 DIAGNOSIS — R2681 Unsteadiness on feet: Secondary | ICD-10-CM | POA: Diagnosis not present

## 2018-04-12 DIAGNOSIS — R296 Repeated falls: Secondary | ICD-10-CM | POA: Diagnosis not present

## 2018-04-12 DIAGNOSIS — R2681 Unsteadiness on feet: Secondary | ICD-10-CM | POA: Diagnosis not present

## 2018-04-12 DIAGNOSIS — I10 Essential (primary) hypertension: Secondary | ICD-10-CM | POA: Diagnosis not present

## 2018-04-12 DIAGNOSIS — F411 Generalized anxiety disorder: Secondary | ICD-10-CM

## 2018-04-12 DIAGNOSIS — M1990 Unspecified osteoarthritis, unspecified site: Secondary | ICD-10-CM | POA: Diagnosis not present

## 2018-04-14 ENCOUNTER — Telehealth: Payer: Self-pay | Admitting: Family Medicine

## 2018-04-14 DIAGNOSIS — R2681 Unsteadiness on feet: Secondary | ICD-10-CM | POA: Diagnosis not present

## 2018-04-14 DIAGNOSIS — I10 Essential (primary) hypertension: Secondary | ICD-10-CM | POA: Diagnosis not present

## 2018-04-14 DIAGNOSIS — R296 Repeated falls: Secondary | ICD-10-CM | POA: Diagnosis not present

## 2018-04-14 DIAGNOSIS — M1990 Unspecified osteoarthritis, unspecified site: Secondary | ICD-10-CM | POA: Diagnosis not present

## 2018-04-14 NOTE — Telephone Encounter (Signed)
Patients physical therapist from [facility] called to let you know that patients blood pressure is 174/90. Monday it was 158/78, last Wednesday is was 140/68  Cb# [phone]/ [phone]

## 2018-04-16 NOTE — Telephone Encounter (Signed)
Noted there is fluctuation and I have reviewed the numbers and am making no changes in her medications

## 2018-04-19 DIAGNOSIS — R296 Repeated falls: Secondary | ICD-10-CM | POA: Diagnosis not present

## 2018-04-19 DIAGNOSIS — M1990 Unspecified osteoarthritis, unspecified site: Secondary | ICD-10-CM | POA: Diagnosis not present

## 2018-04-19 DIAGNOSIS — I10 Essential (primary) hypertension: Secondary | ICD-10-CM | POA: Diagnosis not present

## 2018-04-19 DIAGNOSIS — R2681 Unsteadiness on feet: Secondary | ICD-10-CM | POA: Diagnosis not present

## 2018-04-21 DIAGNOSIS — R2681 Unsteadiness on feet: Secondary | ICD-10-CM | POA: Diagnosis not present

## 2018-04-21 DIAGNOSIS — I10 Essential (primary) hypertension: Secondary | ICD-10-CM | POA: Diagnosis not present

## 2018-04-21 DIAGNOSIS — R296 Repeated falls: Secondary | ICD-10-CM | POA: Diagnosis not present

## 2018-04-21 DIAGNOSIS — M1990 Unspecified osteoarthritis, unspecified site: Secondary | ICD-10-CM | POA: Diagnosis not present

## 2018-04-25 DIAGNOSIS — I10 Essential (primary) hypertension: Secondary | ICD-10-CM | POA: Diagnosis not present

## 2018-04-25 DIAGNOSIS — R2681 Unsteadiness on feet: Secondary | ICD-10-CM | POA: Diagnosis not present

## 2018-04-25 DIAGNOSIS — M1990 Unspecified osteoarthritis, unspecified site: Secondary | ICD-10-CM | POA: Diagnosis not present

## 2018-04-25 DIAGNOSIS — R296 Repeated falls: Secondary | ICD-10-CM | POA: Diagnosis not present

## 2018-04-28 DIAGNOSIS — M1990 Unspecified osteoarthritis, unspecified site: Secondary | ICD-10-CM | POA: Diagnosis not present

## 2018-04-28 DIAGNOSIS — I10 Essential (primary) hypertension: Secondary | ICD-10-CM | POA: Diagnosis not present

## 2018-04-28 DIAGNOSIS — R296 Repeated falls: Secondary | ICD-10-CM | POA: Diagnosis not present

## 2018-04-28 DIAGNOSIS — R2681 Unsteadiness on feet: Secondary | ICD-10-CM | POA: Diagnosis not present

## 2018-05-02 DIAGNOSIS — M1990 Unspecified osteoarthritis, unspecified site: Secondary | ICD-10-CM | POA: Diagnosis not present

## 2018-05-02 DIAGNOSIS — R296 Repeated falls: Secondary | ICD-10-CM | POA: Diagnosis not present

## 2018-05-02 DIAGNOSIS — I10 Essential (primary) hypertension: Secondary | ICD-10-CM | POA: Diagnosis not present

## 2018-05-02 DIAGNOSIS — R2681 Unsteadiness on feet: Secondary | ICD-10-CM | POA: Diagnosis not present

## 2018-05-05 DIAGNOSIS — I10 Essential (primary) hypertension: Secondary | ICD-10-CM | POA: Diagnosis not present

## 2018-05-05 DIAGNOSIS — M1990 Unspecified osteoarthritis, unspecified site: Secondary | ICD-10-CM | POA: Diagnosis not present

## 2018-05-05 DIAGNOSIS — R2681 Unsteadiness on feet: Secondary | ICD-10-CM | POA: Diagnosis not present

## 2018-05-05 DIAGNOSIS — R296 Repeated falls: Secondary | ICD-10-CM | POA: Diagnosis not present

## 2018-05-09 DIAGNOSIS — M1990 Unspecified osteoarthritis, unspecified site: Secondary | ICD-10-CM | POA: Diagnosis not present

## 2018-05-09 DIAGNOSIS — R2681 Unsteadiness on feet: Secondary | ICD-10-CM | POA: Diagnosis not present

## 2018-05-09 DIAGNOSIS — R296 Repeated falls: Secondary | ICD-10-CM | POA: Diagnosis not present

## 2018-05-09 DIAGNOSIS — I10 Essential (primary) hypertension: Secondary | ICD-10-CM | POA: Diagnosis not present

## 2018-05-12 DIAGNOSIS — I10 Essential (primary) hypertension: Secondary | ICD-10-CM | POA: Diagnosis not present

## 2018-05-12 DIAGNOSIS — M1990 Unspecified osteoarthritis, unspecified site: Secondary | ICD-10-CM | POA: Diagnosis not present

## 2018-05-12 DIAGNOSIS — R296 Repeated falls: Secondary | ICD-10-CM | POA: Diagnosis not present

## 2018-05-12 DIAGNOSIS — R2681 Unsteadiness on feet: Secondary | ICD-10-CM | POA: Diagnosis not present

## 2018-05-18 ENCOUNTER — Other Ambulatory Visit: Payer: Self-pay | Admitting: Family Medicine

## 2018-05-19 ENCOUNTER — Telehealth: Payer: Self-pay | Admitting: Family Medicine

## 2018-05-19 NOTE — Telephone Encounter (Signed)
PT LVM for the nurse to call her

## 2018-05-20 NOTE — Telephone Encounter (Signed)
Wants you to know that her head and her neck has been bothering her a little more now and she takes a tylenol daily but she is noticing the pain a little more now. Kept saying its not a bad hurt but she wanted to know what she could do for it before it got really bad. Please advise

## 2018-05-24 ENCOUNTER — Ambulatory Visit: Payer: Medicare Other | Admitting: Family Medicine

## 2018-05-25 NOTE — Telephone Encounter (Signed)
She can use topical OTC  rubs like Bengay  And Myoflex or any type of arthritis medication she has like that twice daily to massage the neck , she has arthritis in her neck and this is contributing to the headache One tylenol daily will also help to control  the arthritis, pain

## 2018-05-25 NOTE — Telephone Encounter (Signed)
Called patient to advise of Dr.Simpson's recommendations. Number busy. Will try again later.

## 2018-05-26 NOTE — Telephone Encounter (Signed)
Called again, number busy

## 2018-05-27 NOTE — Telephone Encounter (Signed)
Unable to reach  Mailed letter

## 2018-06-09 ENCOUNTER — Encounter: Payer: Self-pay | Admitting: Family Medicine

## 2018-06-09 ENCOUNTER — Ambulatory Visit (INDEPENDENT_AMBULATORY_CARE_PROVIDER_SITE_OTHER): Payer: Medicare Other | Admitting: Family Medicine

## 2018-06-09 VITALS — BP 160/72 | HR 68 | Resp 16 | Ht 68.0 in | Wt 152.0 lb

## 2018-06-09 DIAGNOSIS — R7302 Impaired glucose tolerance (oral): Secondary | ICD-10-CM | POA: Diagnosis not present

## 2018-06-09 DIAGNOSIS — E785 Hyperlipidemia, unspecified: Secondary | ICD-10-CM | POA: Diagnosis not present

## 2018-06-09 DIAGNOSIS — R51 Headache: Secondary | ICD-10-CM

## 2018-06-09 DIAGNOSIS — I10 Essential (primary) hypertension: Secondary | ICD-10-CM

## 2018-06-09 DIAGNOSIS — R2681 Unsteadiness on feet: Secondary | ICD-10-CM

## 2018-06-09 DIAGNOSIS — G8929 Other chronic pain: Secondary | ICD-10-CM

## 2018-06-09 DIAGNOSIS — Z23 Encounter for immunization: Secondary | ICD-10-CM

## 2018-06-09 DIAGNOSIS — F411 Generalized anxiety disorder: Secondary | ICD-10-CM

## 2018-06-09 DIAGNOSIS — R519 Headache, unspecified: Secondary | ICD-10-CM

## 2018-06-09 DIAGNOSIS — R7301 Impaired fasting glucose: Secondary | ICD-10-CM

## 2018-06-09 MED ORDER — AMLODIPINE BESYLATE 2.5 MG PO TABS: ORAL_TABLET | ORAL | 5 refills | Status: DC

## 2018-06-09 NOTE — Patient Instructions (Signed)
F/U as before , call if you need me before  New additional medication for blood pressure is amlodipine 2.5 mg one at bedtime since blood pressure a bit high  Do neck  Exercises and use your walker so you straighten back  Call when you decide you need help for finger with dr Luna Glasgow  As far as tremor which is occasional , I attribute this to aging, and anxiety at times, however neurology can fully assess and is available should  you become more concerned  Please increase food intake , and  Boosts so no weight loss  Fasting CBC, lipid, cmp and EGFr and tSH and hBA1C first week in October

## 2018-06-11 ENCOUNTER — Encounter: Payer: Self-pay | Admitting: Family Medicine

## 2018-06-11 NOTE — Progress Notes (Signed)
   Candace Cervantes     MRN: [id number]      DOB: [birth year]   HPI Candace Cervantes is here with a c/o headache, mainly right sided extending from neck, rated at a 5, relieved with tylenol . She denies localized weakness or numbness  Preventive health is updated, specifically   Immunization.   States that the physical therapy was very beneficial, however , not using her walker  As instructed to help with posture, advised her to do so  The PT denies any adverse reactions to current medications since the last visit.  Daughter concerned about weight loss, she is often eating alone , has 1 supplement / day,  ROS Denies recent fever or chills. Denies sinus pressure, nasal congestion, ear pain or sore throat. Denies chest congestion, productive cough or wheezing. Denies chest pains, palpitations and leg swelling Denies abdominal pain, nausea, vomiting,diarrhea or constipation.   Denies dysuria, frequency, hesitancy or incontinence. C/o  joint pain, swelling and limitation in mobility. Denies headaches, seizures, numbness, or tingling. Denies , anxiety or insomnia. Denies skin break down or rash.   PE  BP (!) 160/72   Pulse 68   Resp 16   Ht 5\' 8"  (1.727 m)   Wt 152 lb (68.9 kg)   SpO2 98%   BMI 23.11 kg/m   Patient alert and oriented and in no cardiopulmonary distress.  HEENT: No facial asymmetry, EOMI,   oropharynx pink and moist.  Neck decreased ROM no JVD, no mass.  Chest: Clear to auscultation bilaterally.  CVS: S1, S2 no murmurs, no S3.Regular rate.  ABD: Soft non tender.   Ext: No edema  MS: decreased  ROM spine, shoulders, hips and knees.trigger fingers x 2 , index and 4th on left hand  Skin: Intact, no ulcerations or rash noted.  Psych: Good eye contact, normal affect. Memory intact not anxious or depressed appearing.  CNS: CN 2-12 intact, power,  normal throughout.no focal deficits noted.   Assessment & Plan  Essential hypertension Uncontrolled, add amlodipine 2.5  mg at bedtime DASH diet and commitment to daily physical activity for a minimum of 30 minutes discussed and encouraged, as a part of hypertension management. The importance of attaining a healthy weight is also discussed.  BP/Weight 06/09/2018 03/31/2018 03/04/2018 02/23/2018 02/14/2018 01/25/2018 10/13/3233  Systolic BP 573 220 254 270 [phone]  Diastolic BP 72 78 78 74 72 70 78  Wt. (Lbs) 152 154 155 153 159 159 160  BMI 23.11 23.42 23.57 25.46 26.46 26.46 23.63   F/u in 6 weeks     Chronic headache disorder Chronic c/o headache with normal head scan less than 6 months ago and non focal neurologic exam Pt has severe arthritis of neck and I believe ha this is main contributor to the headache and explain this again to both her and her daughter, she is to use tylenol for relief as she is already doing and is encouraged to address neck stiffness and neck pain with topical rubs, exercises and massages  ANXIETY DISORDER, GENERALIZED C/o tremor not disabling , but a concern. No witnessed significant tremor during  20 minute visit and no significant intentional tremor, reassured that this was mild essential tremor , worse with aging, however option of neurology consult given, as his is a recurrent concern both by the pt and her daughter  Unsteady gait Improved after PT, pt to be diligent with use of walker and exercises

## 2018-06-11 NOTE — Assessment & Plan Note (Signed)
C/o tremor not disabling , but a concern. No witnessed significant tremor during  20 minute visit and no significant intentional tremor, reassured that this was mild essential tremor , worse with aging, however option of neurology consult given, as his is a recurrent concern both by the pt and her daughter

## 2018-06-11 NOTE — Assessment & Plan Note (Addendum)
Chronic c/o headache with normal head scan less than 6 months ago and non focal neurologic exam Pt has severe arthritis of neck and I believe ha this is main contributor to the headache and explain this again to both her and her daughter, she is to use tylenol for relief as she is already doing and is encouraged to address neck stiffness and neck pain with topical rubs, exercises and massages

## 2018-06-11 NOTE — Assessment & Plan Note (Signed)
Uncontrolled, add amlodipine 2.5 mg at bedtime DASH diet and commitment to daily physical activity for a minimum of 30 minutes discussed and encouraged, as a part of hypertension management. The importance of attaining a healthy weight is also discussed.  BP/Weight 06/09/2018 03/31/2018 03/04/2018 02/23/2018 02/14/2018 01/25/2018 9/73/3125  Systolic BP 087 199 412 [phone]  Diastolic BP 72 78 78 74 72 70 78  Wt. (Lbs) 152 154 155 153 159 159 160  BMI 23.11 23.42 23.57 25.46 26.46 26.46 23.63   F/u in 6 weeks

## 2018-06-11 NOTE — Assessment & Plan Note (Signed)
Improved after PT, pt to be diligent with use of walker and exercises

## 2018-06-13 ENCOUNTER — Other Ambulatory Visit: Payer: Self-pay | Admitting: Family Medicine

## 2018-07-08 DIAGNOSIS — R7301 Impaired fasting glucose: Secondary | ICD-10-CM | POA: Diagnosis not present

## 2018-07-08 DIAGNOSIS — E785 Hyperlipidemia, unspecified: Secondary | ICD-10-CM | POA: Diagnosis not present

## 2018-07-08 DIAGNOSIS — I10 Essential (primary) hypertension: Secondary | ICD-10-CM | POA: Diagnosis not present

## 2018-07-09 LAB — COMPLETE METABOLIC PANEL WITH GFR
AG Ratio: 1.5 (calc) (ref 1.0–2.5)
ALBUMIN MSPROF: 4.5 g/dL (ref 3.6–5.1)
ALT: 25 U/L (ref 6–29)
AST: 30 U/L (ref 10–35)
Alkaline phosphatase (APISO): 56 U/L (ref 33–130)
BUN / CREAT RATIO: 24 (calc) — AB (ref 6–22)
BUN: 23 mg/dL (ref 7–25)
CALCIUM: 10.1 mg/dL (ref 8.6–10.4)
CO2: 31 mmol/L (ref 20–32)
CREATININE: 0.95 mg/dL — AB (ref 0.60–0.88)
Chloride: 104 mmol/L (ref 98–110)
GFR, EST AFRICAN AMERICAN: 61 mL/min/{1.73_m2} (ref >=60)
GFR, EST NON AFRICAN AMERICAN: 52 mL/min/{1.73_m2} — AB (ref >=60)
GLUCOSE: 98 mg/dL (ref 65–99)
Globulin: 3 g/dL (calc) (ref 1.9–3.7)
Potassium: 3.7 mmol/L (ref 3.5–5.3)
Sodium: 142 mmol/L (ref 135–146)
TOTAL PROTEIN: 7.5 g/dL (ref 6.1–8.1)
Total Bilirubin: 0.6 mg/dL (ref 0.2–1.2)

## 2018-07-09 LAB — CBC
HCT: 36.9 % (ref 35.0–45.0)
Hemoglobin: 12.2 g/dL (ref 11.7–15.5)
MCH: 30.8 pg (ref 27.0–33.0)
MCHC: 33.1 g/dL (ref 32.0–36.0)
MCV: 93.2 fL (ref 80.0–100.0)
MPV: 10.2 fL (ref 7.5–12.5)
PLATELETS: 197 10*3/uL (ref 140–400)
RBC: 3.96 10*6/uL (ref 3.80–5.10)
RDW: 12.9 % (ref 11.0–15.0)
WBC: 3.6 10*3/uL — ABNORMAL LOW (ref 3.8–10.8)

## 2018-07-09 LAB — LIPID PANEL
CHOLESTEROL: 179 mg/dL (ref <200)
HDL: 83 mg/dL (ref >50)
LDL Cholesterol (Calc): 82 mg/dL (calc)
Non-HDL Cholesterol (Calc): 96 mg/dL (calc) (ref <130)
Total CHOL/HDL Ratio: 2.2 (calc) (ref <5.0)
Triglycerides: 56 mg/dL (ref <150)

## 2018-07-09 LAB — HEMOGLOBIN A1C
Hgb A1c MFr Bld: 6.1 % of total Hgb — ABNORMAL HIGH (ref <5.7)
Mean Plasma Glucose: 128 (calc)
eAG (mmol/L): 7.1 (calc)

## 2018-07-09 LAB — TSH: TSH: 2.5 mIU/L (ref 0.40–4.50)

## 2018-07-25 ENCOUNTER — Ambulatory Visit (INDEPENDENT_AMBULATORY_CARE_PROVIDER_SITE_OTHER): Payer: Medicare Other

## 2018-07-25 ENCOUNTER — Ambulatory Visit: Payer: Medicare Other | Admitting: Family Medicine

## 2018-07-25 VITALS — BP 162/78 | Resp 15 | Ht 68.0 in | Wt 153.4 lb

## 2018-07-25 DIAGNOSIS — Z0000 Encounter for general adult medical examination without abnormal findings: Secondary | ICD-10-CM | POA: Diagnosis not present

## 2018-07-25 NOTE — Patient Instructions (Signed)
Candace Cervantes , Thank you for taking time to come for your Medicare Wellness Visit. I appreciate your ongoing commitment to your health goals. Please review the following plan we discussed and let me know if I can assist you in the future.   Schedule mammogram for early am  Wellness in 12 months    Screening recommendations/referrals: Colonoscopy: no longer indicated  Mammogram: referred- will schedule at checkout  Bone Density: complete  Recommended yearly ophthalmology/optometry visit for glaucoma screening and checkup Recommended yearly dental visit for hygiene and checkup  Vaccinations: Influenza vaccine: up to date  Pneumococcal vaccine: complete  Tdap vaccine: complete  Shingles vaccine: n/a  Advanced directives: info given   Conditions/risks identified: fall risk  Next appointment: wellness in 1 year   Preventive Care 82 Years and Older, Female Preventive care refers to lifestyle choices and visits with your health care provider that can promote health and wellness. What does preventive care include?  A yearly physical exam. This is also called an annual well check.  Dental exams once or twice a year.  Routine eye exams. Ask your health care provider how often you should have your eyes checked.  Personal lifestyle choices, including:  Daily care of your teeth and gums.  Regular physical activity.  Eating a healthy diet.  Avoiding tobacco and drug use.  Limiting alcohol use.  Practicing safe sex.  Taking low-dose aspirin every day.  Taking vitamin and mineral supplements as recommended by your health care provider. What happens during an annual well check? The services and screenings done by your health care provider during your annual well check will depend on your age, overall health, lifestyle risk factors, and family history of disease. Counseling  Your health care provider may ask you questions about your:  Alcohol use.  Tobacco use.  Drug  use.  Emotional well-being.  Home and relationship well-being.  Sexual activity.  Eating habits.  History of falls.  Memory and ability to understand (cognition).  Work and work Statistician.  Reproductive health. Screening  You may have the following tests or measurements:  Height, weight, and BMI.  Blood pressure.  Lipid and cholesterol levels. These may be checked every 5 years, or more frequently if you are over 15 years old.  Skin check.  Lung cancer screening. You may have this screening every year starting at age 50 if you have a 30-pack-year history of smoking and currently smoke or have quit within the past 15 years.  Fecal occult blood test (FOBT) of the stool. You may have this test every year starting at age 43.  Flexible sigmoidoscopy or colonoscopy. You may have a sigmoidoscopy every 5 years or a colonoscopy every 10 years starting at age 42.  Hepatitis C blood test.  Hepatitis B blood test.  Sexually transmitted disease (STD) testing.  Diabetes screening. This is done by checking your blood sugar (glucose) after you have not eaten for a while (fasting). You may have this done every 1-3 years.  Bone density scan. This is done to screen for osteoporosis. You may have this done starting at age 70.  Mammogram. This may be done every 1-2 years. Talk to your health care provider about how often you should have regular mammograms. Talk with your health care provider about your test results, treatment options, and if necessary, the need for more tests. Vaccines  Your health care provider may recommend certain vaccines, such as:  Influenza vaccine. This is recommended every year.  Tetanus, diphtheria, and  acellular pertussis (Tdap, Td) vaccine. You may need a Td booster every 10 years.  Zoster vaccine. You may need this after age 58.  Pneumococcal 13-valent conjugate (PCV13) vaccine. One dose is recommended after age 59.  Pneumococcal polysaccharide  (PPSV23) vaccine. One dose is recommended after age 13. Talk to your health care provider about which screenings and vaccines you need and how often you need them. This information is not intended to replace advice given to you by your health care provider. Make sure you discuss any questions you have with your health care provider. Document Released: 10/18/2015 Document Revised: 06/10/2016 Document Reviewed: 07/23/2015 Elsevier Interactive Patient Education  2017 [place] Prevention in the Home Falls can cause injuries. They can happen to people of all ages. There are many things you can do to make your home safe and to help prevent falls. What can I do on the outside of my home?  Regularly fix the edges of walkways and driveways and fix any cracks.  Remove anything that might make you trip as you walk through a door, such as a raised step or threshold.  Trim any bushes or trees on the path to your home.  Use bright outdoor lighting.  Clear any walking paths of anything that might make someone trip, such as rocks or tools.  Regularly check to see if handrails are loose or broken. Make sure that both sides of any steps have handrails.  Any raised decks and porches should have guardrails on the edges.  Have any leaves, snow, or ice cleared regularly.  Use sand or salt on walking paths during winter.  Clean up any spills in your garage right away. This includes oil or grease spills. What can I do in the bathroom?  Use night lights.  Install grab bars by the toilet and in the tub and shower. Do not use towel bars as grab bars.  Use non-skid mats or decals in the tub or shower.  If you need to sit down in the shower, use a plastic, non-slip stool.  Keep the floor dry. Clean up any water that spills on the floor as soon as it happens.  Remove soap buildup in the tub or shower regularly.  Attach bath mats securely with double-sided non-slip rug tape.  Do not have  throw rugs and other things on the floor that can make you trip. What can I do in the bedroom?  Use night lights.  Make sure that you have a light by your bed that is easy to reach.  Do not use any sheets or blankets that are too big for your bed. They should not hang down onto the floor.  Have a firm chair that has side arms. You can use this for support while you get dressed.  Do not have throw rugs and other things on the floor that can make you trip. What can I do in the kitchen?  Clean up any spills right away.  Avoid walking on wet floors.  Keep items that you use a lot in easy-to-reach places.  If you need to reach something above you, use a strong step stool that has a grab bar.  Keep electrical cords out of the way.  Do not use floor polish or wax that makes floors slippery. If you must use wax, use non-skid floor wax.  Do not have throw rugs and other things on the floor that can make you trip. What can I do with my stairs?  Do not leave any items on the stairs.  Make sure that there are handrails on both sides of the stairs and use them. Fix handrails that are broken or loose. Make sure that handrails are as long as the stairways.  Check any carpeting to make sure that it is firmly attached to the stairs. Fix any carpet that is loose or worn.  Avoid having throw rugs at the top or bottom of the stairs. If you do have throw rugs, attach them to the floor with carpet tape.  Make sure that you have a light switch at the top of the stairs and the bottom of the stairs. If you do not have them, ask someone to add them for you. What else can I do to help prevent falls?  Wear shoes that:  Do not have high heels.  Have rubber bottoms.  Are comfortable and fit you well.  Are closed at the toe. Do not wear sandals.  If you use a stepladder:  Make sure that it is fully opened. Do not climb a closed stepladder.  Make sure that both sides of the stepladder are  locked into place.  Ask someone to hold it for you, if possible.  Clearly mark and make sure that you can see:  Any grab bars or handrails.  First and last steps.  Where the edge of each step is.  Use tools that help you move around (mobility aids) if they are needed. These include:  Canes.  Walkers.  Scooters.  Crutches.  Turn on the lights when you go into a dark area. Replace any light bulbs as soon as they burn out.  Set up your furniture so you have a clear path. Avoid moving your furniture around.  If any of your floors are uneven, fix them.  If there are any pets around you, be aware of where they are.  Review your medicines with your doctor. Some medicines can make you feel dizzy. This can increase your chance of falling. Ask your doctor what other things that you can do to help prevent falls. This information is not intended to replace advice given to you by your health care provider. Make sure you discuss any questions you have with your health care provider. Document Released: 07/18/2009 Document Revised: 02/27/2016 Document Reviewed: 10/26/2014 Elsevier Interactive Patient Education  2017 [employer].

## 2018-07-25 NOTE — Progress Notes (Signed)
Subjective:   Candace Cervantes is a 84 y.o. female who presents for Medicare Annual (Subsequent) preventive examination.  Review of Systems:   Cardiac Risk Factors include: advanced age (>27men, >41 women);dyslipidemia;hypertension     Objective:     Vitals: BP (!) 162/78   Resp 15   Ht 5\' 8"  (1.727 m)   Wt 153 lb 6.4 oz (69.6 kg)   SpO2 98%   BMI 23.32 kg/m   Body mass index is 23.32 kg/m.  Advanced Directives 07/25/2018 02/14/2018 07/12/2017 04/28/2017 02/20/2017 02/20/2017 03/26/2016  Does Patient Have a Medical Advance Directive? No No No Yes Yes Yes Yes  Type of Advance Directive - - Public librarian;Living will [place];Living will [place];Living will [place]  Does patient want to make changes to medical advance directive? - - - - No - Patient declined - No - Patient declined  Copy of [place] in Chart? - - - No - copy requested No - copy requested No - copy requested No - copy requested  Would patient like information on creating a medical advance directive? Yes (ED - Information included in AVS) - (No Data) - - - No - patient declined information  Pre-existing out of facility DNR order (yellow form or pink MOST form) - - - - - - -    Tobacco Social History   Tobacco Use  Smoking Status Never Smoker  Smokeless Tobacco Never Used     Counseling given: Not Answered   Clinical Intake:  Pre-visit preparation completed: Yes  Pain : No/denies pain Pain Score: 0-No pain     Nutritional Status: BMI of 19-24  Normal Diabetes: No  How often do you need to have someone help you when you read instructions, pamphlets, or other written materials from your doctor or pharmacy?: 2 - Rarely What is the last grade level you completed in school?: 12th grade   Interpreter Needed?: No  Information entered by :: Susann Lawhorne LPN   Past Medical History:  Diagnosis Date  . Anxiety  disorder   . Arthritis   . CAD (coronary artery disease) 2005   Nonobstructive 40% RCA lesion and normal ejection fraction at cath   . GERD (gastroesophageal reflux disease)   . Hyperlipidemia   . Hypertension   . Osteoporosis   . Pacemaker 2002   Permanent Placement  . Pacemaker 2007   Generator Changed   . Sinoatrial node dysfunction (HCC)   . SSS (sick sinus syndrome) ([place])   . Zenker diverticulum    previously evaluated by Dr. Erik Obey in 1610.    Past Surgical History:  Procedure Laterality Date  . CHOLECYSTECTOMY    . COLONOSCOPY  2003   no polyps.  . CYSTECTOMY     from back of the neck  . ESOPHAGOGASTRODUODENOSCOPY  2008   Dr. Oneida Alar, difficulty passing scope through UES, incomplete fibrous ring at GEJ, hh, multiple benign gastri polyps  . ESOPHAGOGASTRODUODENOSCOPY N/A 10/17/2013   ZENKER'S, STRICTURE: 10-12.8 MM,  NSAID GASTRITIS FG POLYPS  . ESOPHAGOGASTRODUODENOSCOPY (EGD) WITH ESOPHAGEAL DILATION N/A 10/30/2013   SLF: 1. Zenkers diverticulum with a large opening at the cricopharyngeus 2. 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 Innumerable Fundic gland polyps 4. Mild NSAID gastritis  . MALONEY DILATION N/A 10/17/2013   Procedure: MALONEY DILATION;  Surgeon: Danie Binder, MD;  [facility];  Service: Endoscopy;  Laterality: N/A;  with PEDS GASTROSCOPE  . PACEMAKER  INSERTION  2002  . PACEMAKER PLACEMENT  2007   replacement   . SAVORY DILATION N/A 10/17/2013   Procedure: SAVORY DILATION;  Surgeon: Danie Binder, MD;  Location: [facility];  Service: Endoscopy;  Laterality: N/A;  with PEDS GASTROSCOPE  . TOTAL ABDOMINAL HYSTERECTOMY  2002   Family History  Problem Relation Age of Onset  . Heart disease Mother   . Lung disease Sister   . Alcohol abuse Brother   . Cancer Brother        possible prostate  . Diabetes Sister   . Colon cancer Other        sibling, age 36   Social History   Socioeconomic History  . Marital status: Married    Spouse name:  Not on file  . Number of children: 2  . Years of education: Not on file  . Highest education level: 12th grade  Occupational History  . Not on file  Social Needs  . Financial resource strain: Not hard at all  . Food insecurity:    Worry: Never true    Inability: Never true  . Transportation needs:    Medical: No    Non-medical: No  Tobacco Use  . Smoking status: Never Smoker  . Smokeless tobacco: Never Used  Substance and Sexual Activity  . Alcohol use: No    Alcohol/week: 0.0 standard drinks  . Drug use: No  . Sexual activity: Not Currently  Lifestyle  . Physical activity:    Days per week: 0 days    Minutes per session: 0 min  . Stress: Not at all  Relationships  . Social connections:    Talks on phone: More than three times a week    Gets together: More than three times a week    Attends religious service: More than 4 times per year    Active member of club or organization: Yes    Attends meetings of clubs or organizations: More than 4 times per year    Relationship status: Widowed  Other Topics Concern  . Not on file  Social History Narrative  . Not on file    Outpatient Encounter Medications as of 07/25/2018  Medication Sig  . ACETAMINOPHEN EXTRA STRENGTH PO Take 650 mg by mouth daily as needed (pain).  Marland Kitchen amLODipine (NORVASC) 2.5 MG tablet Take one tablet at bedtime for blood presure  . citalopram (CELEXA) 10 MG tablet Take 1 tablet (10 mg total) by mouth daily.  . famotidine (PEPCID) 10 MG tablet Take 1 tablet (10 mg total) by mouth daily before supper.  . Multiple Vitamin (MULTIVITAMIN WITH MINERALS) TABS tablet Take 1 tablet by mouth daily.  . nitroGLYCERIN (NITROSTAT) 0.4 MG SL tablet Place 1 tablet (0.4 mg total) under the tongue every 5 (five) minutes as needed for chest pain. One tablet under the tongue at onset of chest pains, repeat every five minutes as needed  . omeprazole (PRILOSEC) 20 MG capsule TAKE (1) CAPSULE BY MOUTH EVERY DAY.  Marland Kitchen potassium  chloride (K-DUR) 10 MEQ tablet Take 1 tablet (10 mEq total) by mouth 2 (two) times daily.  . pravastatin (PRAVACHOL) 40 MG tablet TAKE 1 TABLET EVERY DAY.  Marland Kitchen triamterene-hydrochlorothiazide (MAXZIDE-25) 37.5-25 MG tablet TAKE ONE TABLET BY MOUTH ONCE DAILY.  Marland Kitchen UNABLE TO FIND Quad cane x 1 DX. Unsteady gait   No facility-administered encounter medications on file as of 07/25/2018.     Activities of Daily Living In your present state of health, do you  have any difficulty performing the following activities: 07/25/2018  Hearing? N  Vision? N  Difficulty concentrating or making decisions? N  Walking or climbing stairs? Y  Dressing or bathing? Y  Doing errands, shopping? Y  Comment daughter brings her   Conservation officer, nature and eating ? Y  Comment gets meals on wheels   Using the Toilet? N  In the past six months, have you accidently leaked urine? Y  Do you have problems with loss of bowel control? Y  Managing your Medications? N  Comment bubble packaged   Managing your Finances? N  Housekeeping or managing your Housekeeping? Y  Comment daughter and granchildren help clean  Some recent data might be hidden    Patient Care Team: Fayrene Helper, MD as PCP - General Evans Lance, MD as PCP - Cardiology (Cardiology) Lendon Colonel, NP as Nurse Practitioner (Nurse Practitioner) Lattie Haw Cristopher Estimable, MD as Attending Physician (Cardiology) Danie Binder, MD as Consulting Physician (Gastroenterology) Rutherford Guys, MD as Consulting Physician (Ophthalmology) Allyn Kenner, MD as Consulting Physician (Dermatology)    Assessment:   This is a routine wellness examination for Rea.  Exercise Activities and Dietary recommendations Current Exercise Habits: Home exercise routine, Type of exercise: walking, Time (Minutes): 20, Frequency (Times/Week): 3, Weekly Exercise (Minutes/Week): 60, Intensity: Mild, Exercise limited by: cardiac condition(s);orthopedic condition(s)  Goals    .  Exercise 3x per week (30 min per time)     Recommend starting a routine exercise program at least 3 days a week for 30-45 minutes at a time as tolerated.      . Patient Stated (pt-stated)     Will do more chair exercises during free time    . Prevent falls     Will do the chair exercises to help strengthen her legs and take her time and always use walker when ambulating        Fall Risk Fall Risk  07/25/2018 07/25/2018 06/09/2018 03/31/2018 02/23/2018  Falls in the past year? No No No No No  Number falls in past yr: - - - - -  Injury with Fall? - - - - -  Risk for fall due to : - - - - -  Follow up - - - - -   Is the patient's home free of loose throw rugs in walkways, pet beds, electrical cords, etc?   yes      Grab bars in the bathroom? yes      Handrails on the stairs?   yes      Adequate lighting?   yes  Timed Get Up and Go performed: uses walker and completed get up and go in 15 seconds.   Depression Screen PHQ 2/9 Scores 07/25/2018 06/09/2018 03/31/2018 02/23/2018  PHQ - 2 Score 4 2 3 4   PHQ- 9 Score 12 5 11 16      Cognitive Function MMSE - Mini Mental State Exam 01/25/2018  Orientation to time 5  Orientation to Place 5  Registration 3  Attention/ Calculation 5  Recall 1  Language- name 2 objects 2  Language- repeat 1  Language- follow 3 step command 3  Language- read & follow direction 1  Write a sentence 1  Copy design 0  Total score 27     6CIT Screen 07/25/2018 07/12/2017  What Year? 0 points 0 points  What month? 0 points 0 points  What time? 0 points 0 points  Count back from 20 0 points 0 points  Months in  reverse 2 points 0 points  Repeat phrase 4 points 2 points  Total Score 6 2    Immunization History  Administered Date(s) Administered  . H1N1 09/20/2008  . Influenza Split 08/11/2011, 06/22/2012  . Influenza Whole 06/30/2007, 07/08/2009, 07/09/2010  . Influenza,inj,Quad PF,6+ Mos 06/20/2013, 05/28/2014, 06/13/2015, 06/02/2016, 06/14/2017,  06/09/2018  . Pneumococcal Conjugate-13 04/03/2014  . Pneumococcal Polysaccharide-23 09/04/2003  . Td 02/20/2004  . Tdap 08/18/2011  . Zoster 05/24/2008    Qualifies for Shingles Vaccine? declined  Screening Tests Health Maintenance  Topic Date Due  . TETANUS/TDAP  08/17/2021  . INFLUENZA VACCINE  Completed  . DEXA SCAN  Completed  . PNA vac Low Risk Adult  Completed    Cancer Screenings: Lung: Low Dose CT Chest recommended if Age 48-80 years, 30 pack-year currently smoking OR have quit w/in 15years. Patient does not qualify. Breast:  Up to date on Mammogram? No  - mammogram has been ordered  Up to date of Bone Density/Dexa? Yes Colorectal: n/a  Additional Screenings: Hepatitis C Screening: not done      Plan:     I have personally reviewed and noted the following in the patient's chart:   . Medical and social history . Use of alcohol, tobacco or illicit drugs  . Current medications and supplements . Functional ability and status . Nutritional status . Physical activity . Advanced directives . List of other physicians . Hospitalizations, surgeries, and ER visits in previous 12 months . Vitals . Screenings to include cognitive, depression, and falls . Referrals and appointments  In addition, I have reviewed and discussed with patient certain preventive protocols, quality metrics, and best practice recommendations. A written personalized care plan for preventive services as well as general preventive health recommendations were provided to patient.     Kate Sable, LPN, LPN  81/[id number]

## 2018-09-19 ENCOUNTER — Encounter: Payer: Self-pay | Admitting: Family Medicine

## 2018-09-19 ENCOUNTER — Ambulatory Visit (INDEPENDENT_AMBULATORY_CARE_PROVIDER_SITE_OTHER): Payer: Medicare Other | Admitting: Family Medicine

## 2018-09-19 VITALS — BP 162/82 | HR 62 | Resp 15 | Ht 68.0 in | Wt 153.0 lb

## 2018-09-19 DIAGNOSIS — R2681 Unsteadiness on feet: Secondary | ICD-10-CM | POA: Diagnosis not present

## 2018-09-19 DIAGNOSIS — R42 Dizziness and giddiness: Secondary | ICD-10-CM

## 2018-09-19 DIAGNOSIS — R7302 Impaired glucose tolerance (oral): Secondary | ICD-10-CM | POA: Diagnosis not present

## 2018-09-19 DIAGNOSIS — M150 Primary generalized (osteo)arthritis: Secondary | ICD-10-CM

## 2018-09-19 DIAGNOSIS — M159 Polyosteoarthritis, unspecified: Secondary | ICD-10-CM

## 2018-09-19 DIAGNOSIS — I10 Essential (primary) hypertension: Secondary | ICD-10-CM | POA: Diagnosis not present

## 2018-09-19 DIAGNOSIS — E785 Hyperlipidemia, unspecified: Secondary | ICD-10-CM

## 2018-09-19 NOTE — Assessment & Plan Note (Signed)
Elevated at visit, missed medication this am and has been 'rushing" No medication change at this time Encouraged low salt diet , rich In fruit and vegetables

## 2018-09-19 NOTE — Progress Notes (Signed)
   Candace Cervantes     MRN: [id number]      DOB: [birth year]   HPI [name] is here for follow up and re-evaluation of chronic medical conditions, medication management and review of any available recent lab and radiology data.  Preventive health is updated, specifically  Cancer screening and Immunization.   Questions or concerns regarding consultations or procedures which the PT has had in the interim are  addressed. The PT denies any adverse reactions to current medications since the last visit.  Reports several near falls , but uses her walker at all times.c/o intermittent light headedness, which also increases her unsteadiness and risk of injury, requests in home physical therapy as sh has had in the past which was very useful, and is reasonable and inmdicated  C/o abdominal distension intermittently, denies change in BM or blood in the stool  ROS Denies recent fever or chills. Denies sinus pressure, nasal congestion, ear pain or sore throat. Denies chest congestion, productive cough or wheezing. Denies chest pains, palpitations and leg swelling Denies abdominal pain, nausea, vomiting,diarrhea or constipation.   Denies dysuria, frequency, hesitancy or incontinence. Denies headaches, seizures, numbness, or tingling. Denies depression, anxiety or insomnia. Denies skin break down or rash.   PE  BP (!) 162/82   Pulse 62   Resp 15   Ht 5\' 8"  (1.727 m)   Wt 153 lb (69.4 kg)   SpO2 98%   BMI 23.26 kg/m   Patient alert and oriented and in no cardiopulmonary distress.  HEENT: No facial asymmetry, EOMI,   oropharynx pink and moist.  Neck decreased ROM no JVD, no mass.  Chest: Clear to auscultation bilaterally.  CVS: S1, S2 no murmurs, no S3.Regular rate.  ABD: Soft non tender.   Ext: No edema  MS: Decreased ROM spine, shoulders, hips and knees.  Skin: Intact, no ulcerations or rash noted.  Psych: Good eye contact, normal affect. Memory impaired  not anxious or depressed  appearing.  CNS: CN 2-12 intact, power,  normal throughout.no focal deficits noted.   Assessment & Plan  Essential hypertension Elevated at visit, missed medication this am and has been 'rushing" No medication change at this time Encouraged low salt diet , rich In fruit and vegetables  Unsteady gait Reports increase in instability with dizziness and near falls increased in past 4 to 6 weeks, has benefited from PT in the past and will therefore refer for POT twice weekly for 6 months to reduce fall risk and improve mobility  Dizziness Recurrent , intermittent dizziness , placing patient at increased fall risk, refer for in home PT for 6 weeks, twice weekly  Osteoarthritis Generalized osteoarthritis of spine and knees, increased fall risk and unsteady gait, needs in home PT based on recent h/o recurrent near falls Home safety reviewed with her daughter and with the patient at visit also  Hyperlipemia Hyperlipidemia:Low fat diet discussed and encouraged.   Lipid Panel  Lab Results  Component Value Date   CHOL 179 07/08/2018   HDL 83 07/08/2018   LDLCALC 82 07/08/2018   TRIG 56 07/08/2018   CHOLHDL 2.2 07/08/2018   Controlled, no change in medication

## 2018-09-19 NOTE — Patient Instructions (Signed)
F/U mid to end March, call if you need me  Before  Please schedule mammogram at checkout  You will be referred for in home PT for 4 weeks, twice weekly  Please collect fasting lipid, cmp and EGFr and hBA1C and vit D to be drawn 1 week  Before next visit at [facility] not to fall.  NO SCATTER RUGS, let's , de clutter   Thank you  for choosing [facility]. We consider it a privelige to serve you.  Delivering excellent health care in a caring and  compassionate way is our goal.  Partnering with you,  so that together we can achieve this goal is our strategy.

## 2018-09-23 ENCOUNTER — Encounter: Payer: Self-pay | Admitting: Family Medicine

## 2018-09-23 NOTE — Assessment & Plan Note (Signed)
Hyperlipidemia:Low fat diet discussed and encouraged.   Lipid Panel  Lab Results  Component Value Date   CHOL 179 07/08/2018   HDL 83 07/08/2018   LDLCALC 82 07/08/2018   TRIG 56 07/08/2018   CHOLHDL 2.2 07/08/2018   Controlled, no change in medication

## 2018-09-23 NOTE — Assessment & Plan Note (Signed)
Generalized osteoarthritis of spine and knees, increased fall risk and unsteady gait, needs in home PT based on recent h/o recurrent near falls Home safety reviewed with her daughter and with the patient at visit also

## 2018-09-23 NOTE — Assessment & Plan Note (Signed)
Reports increase in instability with dizziness and near falls increased in past 4 to 6 weeks, has benefited from PT in the past and will therefore refer for POT twice weekly for 6 months to reduce fall risk and improve mobility

## 2018-09-23 NOTE — Assessment & Plan Note (Signed)
Recurrent , intermittent dizziness , placing patient at increased fall risk, refer for in home PT for 6 weeks, twice weekly

## 2018-09-29 ENCOUNTER — Other Ambulatory Visit: Payer: Self-pay | Admitting: Family Medicine

## 2018-10-04 ENCOUNTER — Telehealth: Payer: Self-pay

## 2018-10-04 DIAGNOSIS — R2681 Unsteadiness on feet: Secondary | ICD-10-CM

## 2018-10-06 ENCOUNTER — Encounter: Payer: Self-pay | Admitting: *Deleted

## 2018-10-07 ENCOUNTER — Ambulatory Visit (HOSPITAL_COMMUNITY)
Admission: RE | Admit: 2018-10-07 | Discharge: 2018-10-07 | Disposition: A | Discharge status: Home / Self Care | Payer: Medicare Other | Source: Ambulatory Visit | Attending: Family Medicine | Admitting: Family Medicine

## 2018-10-07 DIAGNOSIS — Z1231 Encounter for screening mammogram for malignant neoplasm of breast: Secondary | ICD-10-CM

## 2018-10-24 NOTE — Telephone Encounter (Signed)
Referred to [facility]

## 2018-10-25 DIAGNOSIS — R296 Repeated falls: Secondary | ICD-10-CM | POA: Diagnosis not present

## 2018-10-25 DIAGNOSIS — R7302 Impaired glucose tolerance (oral): Secondary | ICD-10-CM | POA: Diagnosis not present

## 2018-10-25 DIAGNOSIS — R42 Dizziness and giddiness: Secondary | ICD-10-CM | POA: Diagnosis not present

## 2018-10-25 DIAGNOSIS — R2681 Unsteadiness on feet: Secondary | ICD-10-CM | POA: Diagnosis not present

## 2018-10-25 DIAGNOSIS — I10 Essential (primary) hypertension: Secondary | ICD-10-CM | POA: Diagnosis not present

## 2018-10-25 DIAGNOSIS — M1990 Unspecified osteoarthritis, unspecified site: Secondary | ICD-10-CM | POA: Diagnosis not present

## 2018-10-27 ENCOUNTER — Emergency Department (HOSPITAL_COMMUNITY): Payer: Medicare Other

## 2018-10-27 ENCOUNTER — Encounter (HOSPITAL_COMMUNITY): Payer: Self-pay | Admitting: Emergency Medicine

## 2018-10-27 ENCOUNTER — Emergency Department (HOSPITAL_COMMUNITY)
Admission: EM | Admit: 2018-10-27 | Discharge: 2018-10-27 | Disposition: A | Discharge status: Home / Self Care | Payer: Medicare Other | Attending: Emergency Medicine | Admitting: Emergency Medicine

## 2018-10-27 ENCOUNTER — Other Ambulatory Visit: Payer: Self-pay

## 2018-10-27 DIAGNOSIS — R296 Repeated falls: Secondary | ICD-10-CM | POA: Diagnosis not present

## 2018-10-27 DIAGNOSIS — R42 Dizziness and giddiness: Secondary | ICD-10-CM | POA: Diagnosis not present

## 2018-10-27 DIAGNOSIS — Z79899 Other long term (current) drug therapy: Secondary | ICD-10-CM | POA: Insufficient documentation

## 2018-10-27 DIAGNOSIS — S3992XA Unspecified injury of lower back, initial encounter: Secondary | ICD-10-CM

## 2018-10-27 DIAGNOSIS — I10 Essential (primary) hypertension: Secondary | ICD-10-CM | POA: Insufficient documentation

## 2018-10-27 DIAGNOSIS — R2681 Unsteadiness on feet: Secondary | ICD-10-CM | POA: Diagnosis not present

## 2018-10-27 DIAGNOSIS — Y9389 Activity, other specified: Secondary | ICD-10-CM | POA: Diagnosis not present

## 2018-10-27 DIAGNOSIS — W07XXXA Fall from chair, initial encounter: Secondary | ICD-10-CM | POA: Insufficient documentation

## 2018-10-27 DIAGNOSIS — W19XXXA Unspecified fall, initial encounter: Secondary | ICD-10-CM

## 2018-10-27 DIAGNOSIS — R7302 Impaired glucose tolerance (oral): Secondary | ICD-10-CM | POA: Diagnosis not present

## 2018-10-27 DIAGNOSIS — I2510 Atherosclerotic heart disease of native coronary artery without angina pectoris: Secondary | ICD-10-CM | POA: Insufficient documentation

## 2018-10-27 DIAGNOSIS — Z950 Presence of cardiac pacemaker: Secondary | ICD-10-CM | POA: Diagnosis not present

## 2018-10-27 DIAGNOSIS — Y92009 Unspecified place in unspecified non-institutional (private) residence as the place of occurrence of the external cause: Secondary | ICD-10-CM

## 2018-10-27 DIAGNOSIS — M433 Recurrent atlantoaxial dislocation with myelopathy: Secondary | ICD-10-CM | POA: Diagnosis not present

## 2018-10-27 DIAGNOSIS — Y999 Unspecified external cause status: Secondary | ICD-10-CM | POA: Diagnosis not present

## 2018-10-27 DIAGNOSIS — Y92000 Kitchen of unspecified non-institutional (private) residence as  the place of occurrence of the external cause: Secondary | ICD-10-CM | POA: Diagnosis not present

## 2018-10-27 DIAGNOSIS — M1990 Unspecified osteoarthritis, unspecified site: Secondary | ICD-10-CM | POA: Diagnosis not present

## 2018-10-27 NOTE — ED Triage Notes (Signed)
Pt c/o RT buttocks pain and RT lower back pain after a fall this afternoon. Pt stated kitchen chair slid out from underneath her.

## 2018-10-27 NOTE — ED Provider Notes (Signed)
[facility] EMERGENCY DEPARTMENT Provider Note   CSN: [id number] Arrival date & time: 10/27/18  1650     History   Chief Complaint Chief Complaint  Patient presents with  . Fall    HPI Candace Cervantes is a 85 y.o. female.  85 year old female with fall at home. Patient reports ongoing weakness in her legs. She normally ambulates with a walker. She attempted to sit down in the kitchen chair, and the chair slipped out from under her. She landed on her buttocks. She did not hit her head or lose consciousness.  The history is provided by the patient.  Fall  This is a new problem. The current episode started 3 to 5 hours ago. The problem has not changed since onset.Pertinent negatives include no abdominal pain and no headaches.    Past Medical History:  Diagnosis Date  . Anxiety disorder   . Arthritis   . CAD (coronary artery disease) 2005   Nonobstructive 40% RCA lesion and normal ejection fraction at cath   . GERD (gastroesophageal reflux disease)   . Hyperlipidemia   . Hypertension   . Osteoporosis   . Pacemaker 2002   Permanent Placement  . Pacemaker 2007   Generator Changed   . Sinoatrial node dysfunction (HCC)   . SSS (sick sinus syndrome) ([place])   . Zenker diverticulum    previously evaluated by Dr. Erik Obey in [id number].     Patient Active Problem List   Diagnosis Date Noted  . Unsteady gait 01/30/2018  . Fatigue 12/07/2017  . Chronic headache disorder 10/09/2017  . Dizziness 04/24/2016  . GAD (generalized anxiety disorder) 04/21/2015  . IGT (impaired glucose tolerance) 09/27/2014  . Osteopenia 02/20/2014  . Anemia of chronic disease 09/15/2013  . Atrial fibrillation ([place]) 05/16/2013  . Sinoatrial node dysfunction (HCC)   . CAD (coronary artery disease) 08/10/2011  . Cardiac pacemaker in situ 08/10/2011  . MCI (mild cognitive impairment) 12/15/2008  . Allergic rhinitis 09/20/2008  . ZENKER'S DIVERTICULUM 06/14/2008  . GERD 06/14/2008  . Hyperlipemia  02/15/2008  . ANXIETY DISORDER, GENERALIZED 02/15/2008  . Essential hypertension 02/15/2008  . Osteoarthritis 02/15/2008    Past Surgical History:  Procedure Laterality Date  . CHOLECYSTECTOMY    . COLONOSCOPY  2003   no polyps.  . CYSTECTOMY     from back of the neck  . ESOPHAGOGASTRODUODENOSCOPY  2008   Dr. Oneida Alar, difficulty passing scope through UES, incomplete fibrous ring at GEJ, hh, multiple benign gastri polyps  . ESOPHAGOGASTRODUODENOSCOPY N/A 10/17/2013   ZENKER'S, STRICTURE: 10-12.8 MM,  NSAID GASTRITIS FG POLYPS  . ESOPHAGOGASTRODUODENOSCOPY (EGD) WITH ESOPHAGEAL DILATION N/A 10/30/2013   SLF: 1. Zenkers diverticulum with a large opening at the cricopharyngeus 2. 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 Innumerable Fundic gland polyps 4. Mild NSAID gastritis  . MALONEY DILATION N/A 10/17/2013   Procedure: MALONEY DILATION;  Surgeon: Danie Binder, MD;  Location: [facility] ENDO SUITE;  Service: Endoscopy;  Laterality: N/A;  with PEDS GASTROSCOPE  . PACEMAKER INSERTION  2002  . PACEMAKER PLACEMENT  2007   replacement   . SAVORY DILATION N/A 10/17/2013   Procedure: SAVORY DILATION;  Surgeon: Danie Binder, MD;  Location: [facility] ENDO SUITE;  Service: Endoscopy;  Laterality: N/A;  with PEDS GASTROSCOPE  . TOTAL ABDOMINAL HYSTERECTOMY  2002     OB History   No obstetric history on file.      Home Medications    Prior to Admission medications   Medication Sig Start Date  End Date Taking? Authorizing Provider  ACETAMINOPHEN EXTRA STRENGTH PO Take 650 mg by mouth daily as needed (pain).    [provider]  amLODipine (NORVASC) 2.5 MG tablet Take one tablet at bedtime for blood presure 06/09/18   Fayrene Helper, MD  citalopram (CELEXA) 10 MG tablet TAKE 1 TABLET EVERY DAY. 10/03/18   Fayrene Helper, MD  famotidine (PEPCID) 10 MG tablet Take 1 tablet (10 mg total) by mouth daily before supper. 04/22/17   Mahala Menghini, PA-C  Multiple Vitamin (MULTIVITAMIN WITH MINERALS)  TABS tablet Take 1 tablet by mouth daily.    [provider]  nitroGLYCERIN (NITROSTAT) 0.4 MG SL tablet Place 1 tablet (0.4 mg total) under the tongue every 5 (five) minutes as needed for chest pain. One tablet under the tongue at onset of chest pains, repeat every five minutes as needed 04/21/17   Lendon Colonel, NP  omeprazole (PRILOSEC) 20 MG capsule TAKE (1) CAPSULE BY MOUTH EVERY DAY. 10/03/18   Fayrene Helper, MD  potassium chloride (K-DUR) 10 MEQ tablet Take 1 tablet (10 mEq total) by mouth 2 (two) times daily. 01/27/18   Fayrene Helper, MD  pravastatin (PRAVACHOL) 40 MG tablet TAKE 1 TABLET EVERY DAY. 06/13/18   Fayrene Helper, MD  triamterene-hydrochlorothiazide (MAXZIDE-25) 37.5-25 MG tablet TAKE ONE TABLET BY MOUTH ONCE DAILY. 04/12/18   Evans Lance, MD  UNABLE TO FIND Quad cane x 1 DX. Unsteady gait 03/31/18   Fayrene Helper, MD    Family History Family History  Problem Relation Age of Onset  . Heart disease Mother   . Lung disease Sister   . Alcohol abuse Brother   . Cancer Brother        possible prostate  . Diabetes Sister   . Colon cancer Other        sibling, age 2    Social History Social History   Tobacco Use  . Smoking status: Never Smoker  . Smokeless tobacco: Never Used  Substance Use Topics  . Alcohol use: No    Alcohol/week: 0.0 standard drinks  . Drug use: No     Allergies   Patient has no known allergies.   Review of Systems Review of Systems  Gastrointestinal: Negative for abdominal pain.  Musculoskeletal: Positive for arthralgias, gait problem and neck stiffness.  Neurological: Negative for headaches.  All other systems reviewed and are negative.    Physical Exam Updated Vital Signs BP (!) 150/70 (BP Location: Left Arm)   Pulse 64   Temp 98.1 F (36.7 C) (Oral)   Resp 14   Ht 5\' 11"  (1.803 m)   Wt 69.4 kg   SpO2 97%   BMI 21.34 kg/m   Physical Exam Vitals signs and nursing note reviewed. Exam  conducted with a chaperone present.  Constitutional:      General: She is not in acute distress. HENT:     Head: Atraumatic.  Eyes:     Conjunctiva/sclera: Conjunctivae normal.  Cardiovascular:     Rate and Rhythm: Normal rate and regular rhythm.  Pulmonary:     Effort: Pulmonary effort is normal.     Breath sounds: Normal breath sounds.  Abdominal:     General: There is no distension.     Palpations: Abdomen is soft.     Tenderness: There is no abdominal tenderness.  Musculoskeletal:        General: Tenderness and signs of injury present.  Back:  Skin:    General: Skin is warm and dry.  Neurological:     Mental Status: She is alert and oriented to person, place, and time.  Psychiatric:        Mood and Affect: Mood normal.      ED Treatments / Results  Labs (all labs ordered are listed, but only abnormal results are displayed) Labs Reviewed - No data to display  EKG None  Radiology Dg Sacrum/coccyx  Result Date: 10/27/2018 CLINICAL DATA:  Fall with coccygeal pain EXAM: SACRUM AND COCCYX - 2+ VIEW COMPARISON:  CT 08/15/2015 FINDINGS: There is no evidence of fracture or other focal bone lesions. Sacrococcygeal region is obscured by stool and gas on the frontal view. Possible subtle step-off deformity at the sacrococcygeal region on the lateral view as may be seen with a fracture. IMPRESSION: Possible subtle sacrococcygeal minimally offset fracture Electronically Signed   By: Donavan Foil M.D.   On: 10/27/2018 19:52    Procedures Procedures (including critical care time)  Medications Ordered in ED Medications - No data to display   Initial Impression / Assessment and Plan / ED Course  I have reviewed the triage vital signs and the nursing notes.  Pertinent labs & imaging results that were available during my care of the patient were reviewed by me and considered in my medical decision making (see chart for details).    Patient discussed with and seen by Dr.  Lita Mains.    Patient with fall at home. Coccygeal pain. Radiology reveals possible subtle sacrococcygeal fracture. Patient is not on blood thinners, did not hit her head. Care instructions provided. Return precautions discussed. Follow-up with PCP.  Final Clinical Impressions(s) / ED Diagnoses   Final diagnoses:  Fall in home, initial encounter  Coccygeal injury, initial encounter    ED Discharge Orders    None       Etta Quill, NP 10/27/18 2143    Julianne Rice, MD 10/28/18 [phone]

## 2018-10-31 ENCOUNTER — Other Ambulatory Visit: Payer: Self-pay | Admitting: Family Medicine

## 2018-11-01 DIAGNOSIS — R42 Dizziness and giddiness: Secondary | ICD-10-CM | POA: Diagnosis not present

## 2018-11-01 DIAGNOSIS — R296 Repeated falls: Secondary | ICD-10-CM | POA: Diagnosis not present

## 2018-11-01 DIAGNOSIS — M1990 Unspecified osteoarthritis, unspecified site: Secondary | ICD-10-CM | POA: Diagnosis not present

## 2018-11-01 DIAGNOSIS — R7302 Impaired glucose tolerance (oral): Secondary | ICD-10-CM | POA: Diagnosis not present

## 2018-11-01 DIAGNOSIS — I10 Essential (primary) hypertension: Secondary | ICD-10-CM | POA: Diagnosis not present

## 2018-11-01 DIAGNOSIS — R2681 Unsteadiness on feet: Secondary | ICD-10-CM | POA: Diagnosis not present

## 2018-11-04 DIAGNOSIS — R42 Dizziness and giddiness: Secondary | ICD-10-CM

## 2018-11-04 DIAGNOSIS — R785 Finding of other psychotropic drug in blood: Secondary | ICD-10-CM

## 2018-11-04 DIAGNOSIS — M1990 Unspecified osteoarthritis, unspecified site: Secondary | ICD-10-CM

## 2018-11-04 DIAGNOSIS — R7302 Impaired glucose tolerance (oral): Secondary | ICD-10-CM

## 2018-11-04 DIAGNOSIS — R296 Repeated falls: Secondary | ICD-10-CM | POA: Diagnosis not present

## 2018-11-04 DIAGNOSIS — F411 Generalized anxiety disorder: Secondary | ICD-10-CM

## 2018-11-04 DIAGNOSIS — I10 Essential (primary) hypertension: Secondary | ICD-10-CM | POA: Diagnosis not present

## 2018-11-04 DIAGNOSIS — R2681 Unsteadiness on feet: Secondary | ICD-10-CM | POA: Diagnosis not present

## 2018-11-08 DIAGNOSIS — R7302 Impaired glucose tolerance (oral): Secondary | ICD-10-CM | POA: Diagnosis not present

## 2018-11-08 DIAGNOSIS — I10 Essential (primary) hypertension: Secondary | ICD-10-CM | POA: Diagnosis not present

## 2018-11-08 DIAGNOSIS — M1990 Unspecified osteoarthritis, unspecified site: Secondary | ICD-10-CM | POA: Diagnosis not present

## 2018-11-08 DIAGNOSIS — R2681 Unsteadiness on feet: Secondary | ICD-10-CM | POA: Diagnosis not present

## 2018-11-08 DIAGNOSIS — R42 Dizziness and giddiness: Secondary | ICD-10-CM | POA: Diagnosis not present

## 2018-11-08 DIAGNOSIS — R296 Repeated falls: Secondary | ICD-10-CM | POA: Diagnosis not present

## 2018-11-11 ENCOUNTER — Other Ambulatory Visit: Payer: Self-pay | Admitting: Family Medicine

## 2018-11-11 DIAGNOSIS — R7302 Impaired glucose tolerance (oral): Secondary | ICD-10-CM | POA: Diagnosis not present

## 2018-11-11 DIAGNOSIS — M1990 Unspecified osteoarthritis, unspecified site: Secondary | ICD-10-CM | POA: Diagnosis not present

## 2018-11-11 DIAGNOSIS — I10 Essential (primary) hypertension: Secondary | ICD-10-CM | POA: Diagnosis not present

## 2018-11-11 DIAGNOSIS — R2681 Unsteadiness on feet: Secondary | ICD-10-CM | POA: Diagnosis not present

## 2018-11-11 DIAGNOSIS — R42 Dizziness and giddiness: Secondary | ICD-10-CM | POA: Diagnosis not present

## 2018-11-11 DIAGNOSIS — R296 Repeated falls: Secondary | ICD-10-CM | POA: Diagnosis not present

## 2018-11-14 DIAGNOSIS — R296 Repeated falls: Secondary | ICD-10-CM | POA: Diagnosis not present

## 2018-11-14 DIAGNOSIS — R7302 Impaired glucose tolerance (oral): Secondary | ICD-10-CM | POA: Diagnosis not present

## 2018-11-14 DIAGNOSIS — R42 Dizziness and giddiness: Secondary | ICD-10-CM | POA: Diagnosis not present

## 2018-11-14 DIAGNOSIS — I10 Essential (primary) hypertension: Secondary | ICD-10-CM | POA: Diagnosis not present

## 2018-11-14 DIAGNOSIS — R2681 Unsteadiness on feet: Secondary | ICD-10-CM | POA: Diagnosis not present

## 2018-11-14 DIAGNOSIS — M1990 Unspecified osteoarthritis, unspecified site: Secondary | ICD-10-CM | POA: Diagnosis not present

## 2018-11-17 DIAGNOSIS — R7302 Impaired glucose tolerance (oral): Secondary | ICD-10-CM | POA: Diagnosis not present

## 2018-11-17 DIAGNOSIS — I10 Essential (primary) hypertension: Secondary | ICD-10-CM | POA: Diagnosis not present

## 2018-11-17 DIAGNOSIS — M1990 Unspecified osteoarthritis, unspecified site: Secondary | ICD-10-CM | POA: Diagnosis not present

## 2018-11-17 DIAGNOSIS — R296 Repeated falls: Secondary | ICD-10-CM | POA: Diagnosis not present

## 2018-11-17 DIAGNOSIS — R2681 Unsteadiness on feet: Secondary | ICD-10-CM | POA: Diagnosis not present

## 2018-11-17 DIAGNOSIS — R42 Dizziness and giddiness: Secondary | ICD-10-CM | POA: Diagnosis not present

## 2018-11-21 ENCOUNTER — Telehealth: Payer: Self-pay | Admitting: Family Medicine

## 2018-11-21 NOTE — Telephone Encounter (Signed)
Called and spoke with Gerald Stabs and gave verbal order to extend PT for three more weeks

## 2018-11-21 NOTE — Telephone Encounter (Signed)
pls extend for 3 weeks , thanks

## 2018-11-21 NOTE — Telephone Encounter (Signed)
Extend PT for 2x week for 3 more weeks   (recent FALL)

## 2018-11-21 NOTE — Telephone Encounter (Signed)
Do you agree?

## 2018-11-22 DIAGNOSIS — M1990 Unspecified osteoarthritis, unspecified site: Secondary | ICD-10-CM | POA: Diagnosis not present

## 2018-11-22 DIAGNOSIS — R296 Repeated falls: Secondary | ICD-10-CM | POA: Diagnosis not present

## 2018-11-22 DIAGNOSIS — R2681 Unsteadiness on feet: Secondary | ICD-10-CM | POA: Diagnosis not present

## 2018-11-22 DIAGNOSIS — I10 Essential (primary) hypertension: Secondary | ICD-10-CM | POA: Diagnosis not present

## 2018-11-22 DIAGNOSIS — R7302 Impaired glucose tolerance (oral): Secondary | ICD-10-CM | POA: Diagnosis not present

## 2018-11-22 DIAGNOSIS — R42 Dizziness and giddiness: Secondary | ICD-10-CM | POA: Diagnosis not present

## 2018-11-24 ENCOUNTER — Telehealth: Payer: Self-pay | Admitting: Family Medicine

## 2018-11-24 NOTE — Telephone Encounter (Signed)
Pt called and lmom - called back and she did not answer - could not leave msg

## 2018-11-25 DIAGNOSIS — R2681 Unsteadiness on feet: Secondary | ICD-10-CM | POA: Diagnosis not present

## 2018-11-25 DIAGNOSIS — R296 Repeated falls: Secondary | ICD-10-CM | POA: Diagnosis not present

## 2018-11-25 DIAGNOSIS — R42 Dizziness and giddiness: Secondary | ICD-10-CM | POA: Diagnosis not present

## 2018-11-25 DIAGNOSIS — M1990 Unspecified osteoarthritis, unspecified site: Secondary | ICD-10-CM | POA: Diagnosis not present

## 2018-11-25 DIAGNOSIS — I10 Essential (primary) hypertension: Secondary | ICD-10-CM | POA: Diagnosis not present

## 2018-11-29 ENCOUNTER — Other Ambulatory Visit: Payer: Self-pay | Admitting: Family Medicine

## 2018-11-29 DIAGNOSIS — R2681 Unsteadiness on feet: Secondary | ICD-10-CM | POA: Diagnosis not present

## 2018-11-29 DIAGNOSIS — I10 Essential (primary) hypertension: Secondary | ICD-10-CM | POA: Diagnosis not present

## 2018-11-29 DIAGNOSIS — R296 Repeated falls: Secondary | ICD-10-CM | POA: Diagnosis not present

## 2018-11-29 DIAGNOSIS — R42 Dizziness and giddiness: Secondary | ICD-10-CM | POA: Diagnosis not present

## 2018-11-29 DIAGNOSIS — M1990 Unspecified osteoarthritis, unspecified site: Secondary | ICD-10-CM | POA: Diagnosis not present

## 2018-12-02 DIAGNOSIS — R296 Repeated falls: Secondary | ICD-10-CM | POA: Diagnosis not present

## 2018-12-02 DIAGNOSIS — R42 Dizziness and giddiness: Secondary | ICD-10-CM | POA: Diagnosis not present

## 2018-12-02 DIAGNOSIS — R2681 Unsteadiness on feet: Secondary | ICD-10-CM | POA: Diagnosis not present

## 2018-12-02 DIAGNOSIS — I10 Essential (primary) hypertension: Secondary | ICD-10-CM | POA: Diagnosis not present

## 2018-12-02 DIAGNOSIS — M1990 Unspecified osteoarthritis, unspecified site: Secondary | ICD-10-CM | POA: Diagnosis not present

## 2018-12-14 NOTE — Progress Notes (Signed)
REVIEWED-NO ADDITIONAL RECOMMENDATIONS. 

## 2018-12-28 ENCOUNTER — Ambulatory Visit: Payer: Medicare Other | Admitting: Family Medicine

## 2018-12-28 DIAGNOSIS — I10 Essential (primary) hypertension: Secondary | ICD-10-CM | POA: Diagnosis not present

## 2018-12-28 DIAGNOSIS — E785 Hyperlipidemia, unspecified: Secondary | ICD-10-CM | POA: Diagnosis not present

## 2018-12-28 DIAGNOSIS — R7302 Impaired glucose tolerance (oral): Secondary | ICD-10-CM | POA: Diagnosis not present

## 2018-12-29 LAB — COMPLETE METABOLIC PANEL WITH GFR
AG RATIO: 1.5 (calc) (ref 1.0–2.5)
ALBUMIN MSPROF: 4.4 g/dL (ref 3.6–5.1)
ALT: 25 U/L (ref 6–29)
AST: 27 U/L (ref 10–35)
Alkaline phosphatase (APISO): 62 U/L (ref 37–153)
BUN / CREAT RATIO: 21 (calc) (ref 6–22)
BUN: 23 mg/dL (ref 7–25)
CALCIUM: 10 mg/dL (ref 8.6–10.4)
CO2: 31 mmol/L (ref 20–32)
CREATININE: 1.07 mg/dL — AB (ref 0.60–0.88)
Chloride: 102 mmol/L (ref 98–110)
GFR, EST AFRICAN AMERICAN: 53 mL/min/{1.73_m2} — AB (ref >=60)
GFR, EST NON AFRICAN AMERICAN: 45 mL/min/{1.73_m2} — AB (ref >=60)
GLOBULIN: 3 g/dL (ref 1.9–3.7)
Glucose, Bld: 101 mg/dL — ABNORMAL HIGH (ref 65–99)
POTASSIUM: 3.6 mmol/L (ref 3.5–5.3)
SODIUM: 140 mmol/L (ref 135–146)
TOTAL PROTEIN: 7.4 g/dL (ref 6.1–8.1)
Total Bilirubin: 0.6 mg/dL (ref 0.2–1.2)

## 2018-12-29 LAB — HEMOGLOBIN A1C
EAG (MMOL/L): 6.8 (calc)
HEMOGLOBIN A1C: 5.9 %{Hb} — AB (ref <5.7)
Mean Plasma Glucose: 123 (calc)

## 2018-12-29 LAB — LIPID PANEL
CHOLESTEROL: 189 mg/dL (ref <200)
HDL: 88 mg/dL (ref >=50)
LDL Cholesterol (Calc): 87 mg/dL (calc)
Non-HDL Cholesterol (Calc): 101 mg/dL (calc) (ref <130)
Total CHOL/HDL Ratio: 2.1 (calc) (ref <5.0)
Triglycerides: 54 mg/dL (ref <150)

## 2019-01-04 ENCOUNTER — Encounter: Payer: Self-pay | Admitting: Family Medicine

## 2019-01-04 ENCOUNTER — Ambulatory Visit (INDEPENDENT_AMBULATORY_CARE_PROVIDER_SITE_OTHER): Payer: Medicare Other | Admitting: Family Medicine

## 2019-01-04 ENCOUNTER — Other Ambulatory Visit: Payer: Self-pay

## 2019-01-04 DIAGNOSIS — G8929 Other chronic pain: Secondary | ICD-10-CM

## 2019-01-04 DIAGNOSIS — G3184 Mild cognitive impairment, so stated: Secondary | ICD-10-CM

## 2019-01-04 DIAGNOSIS — R51 Headache: Secondary | ICD-10-CM | POA: Diagnosis not present

## 2019-01-04 DIAGNOSIS — M150 Primary generalized (osteo)arthritis: Secondary | ICD-10-CM

## 2019-01-04 DIAGNOSIS — I10 Essential (primary) hypertension: Secondary | ICD-10-CM

## 2019-01-04 DIAGNOSIS — R2681 Unsteadiness on feet: Secondary | ICD-10-CM

## 2019-01-04 DIAGNOSIS — R519 Headache, unspecified: Secondary | ICD-10-CM

## 2019-01-04 DIAGNOSIS — M159 Polyosteoarthritis, unspecified: Secondary | ICD-10-CM

## 2019-01-04 DIAGNOSIS — F411 Generalized anxiety disorder: Secondary | ICD-10-CM

## 2019-01-04 MED ORDER — PRAVASTATIN SODIUM 40 MG PO TABS: 40.0000 mg | ORAL_TABLET | Freq: Every day | ORAL | 5 refills | Status: DC

## 2019-01-04 MED ORDER — AMLODIPINE BESYLATE 2.5 MG PO TABS: ORAL_TABLET | ORAL | 5 refills | Status: DC

## 2019-01-04 MED ORDER — CITALOPRAM HYDROBROMIDE 10 MG PO TABS: 10.0000 mg | ORAL_TABLET | Freq: Every day | ORAL | 5 refills | Status: DC

## 2019-01-04 NOTE — Assessment & Plan Note (Signed)
C/o generalized aches and pains, some days more severe than others. Uses tylenol and topical preparations and is careful with ambulation and movement , she is encouraged to continue this

## 2019-01-04 NOTE — Progress Notes (Signed)
Virtual Visit via Telephone Note  I connected with Candace Cervantes on 01/04/19 at  8:40 AM EDT by telephone and verified that I am speaking with the correct person using two identifiers.   I discussed the limitations, risks, security and privacy concerns of performing an evaluation and management service by telephone and the availability of in person appointments. I also discussed with the patient that there may be a patient responsible charge related to this service. The patient expressed understanding and agreed to proceed.  Patient is in her home , she is alone, her daughter is not currently present, she is aware that I am in the office, and does not have the ability to face time without assistance    History of Present Illness: F/u chronic problems Head is giving her trouble, states she cannot add or subtract as she used to,  and that arithmetic was her best subject  Arthritis pain more of a problem, and not as steady as would wish, near fall on arm of sofa about 2 weeks ago, feet got tangled up States losing weight , reports that she does have a good appetite,drinking 1 to 2 Ensures daily  Denies recent fever or chills. Denies sinus pressure, nasal congestion, ear pain or sore throat. Denies chest congestion, productive cough or wheezing. Denies chest pains, palpitations and leg swelling Denies abdominal pain, nausea, vomiting,diarrhea or constipation.   Denies dysuria, frequency, hesitancy or incontinence.  Denies uncontrolled  depression, anxiety or insomnia. Denies skin break down or rash.       Observations/Objective: There were no vitals taken for this visit.   Assessment and Plan:  Essential hypertension Uncontrolled based on record review and unable to assess at [facility] visit. Due to unsteady gait and repeated near falls, will not adjust medication TO LOWER SBP to less than 140 as I believe she will not tolerate this DASH diet and commitment to daily physical activity for  a minimum of 30 minutes discussed and encouraged, as a part of hypertension management. The importance of attaining a healthy weight is also discussed.  BP/Weight 10/27/2018 09/19/2018 07/25/2018 06/09/2018 03/31/2018 03/04/2018 3/38/2505  Systolic BP 397 673 419 379 024 097 353  Diastolic BP 65 82 78 72 78 78 74  Wt. (Lbs) 153 153 153.4 152 154 155 153  BMI 21.34 23.26 23.32 23.11 23.42 23.57 25.46       Chronic headache disorder Still c/o chronic headache , not disabling, denies any new neurologic symptoms, has severe arthritis in neck whioch likely contributes  MCI (mild cognitive impairment) Concerned about inability to perform mathematic problems, performed very well at the visit , she is reassured somewhat , and is encouraged to continue practicing with her family to keep her brain active  Osteoarthritis C/o generalized aches and pains, some days more severe than others. Uses tylenol and topical preparations and is careful with ambulation and movement , she is encouraged to continue this  Unsteady gait Home safety is reviewed during the visit, this is routinely practiced in her home   GAD (generalized anxiety disorder) Controlled, no change in medication    Follow Up Instructions:    I discussed the assessment and treatment plan with the patient. The patient was provided an opportunity to ask questions and all were answered. The patient agreed with the plan and demonstrated an understanding of the instructions.   The patient was advised to call back or seek an in-person evaluation if the symptoms worsen or if the condition fails to  improve as anticipated.  I provided 21 minutes of non-face-to-face time during this encounter.   [name], MD

## 2019-01-04 NOTE — Assessment & Plan Note (Signed)
Home safety is reviewed during the visit, this is routinely practiced in her home

## 2019-01-04 NOTE — Assessment & Plan Note (Signed)
Concerned about inability to perform mathematic problems, performed very well at the visit , she is reassured somewhat , and is encouraged to continue practicing with her family to keep her brain active

## 2019-01-04 NOTE — Assessment & Plan Note (Signed)
Uncontrolled based on record review and unable to assess at [facility] visit. Due to unsteady gait and repeated near falls, will not adjust medication TO LOWER SBP to less than 140 as I believe she will not tolerate this DASH diet and commitment to daily physical activity for a minimum of 30 minutes discussed and encouraged, as a part of hypertension management. The importance of attaining a healthy weight is also discussed.  BP/Weight 10/27/2018 09/19/2018 07/25/2018 06/09/2018 03/31/2018 03/04/2018 1/63/8466  Systolic BP 599 357 017 793 903 009 233  Diastolic BP 65 82 78 72 78 78 74  Wt. (Lbs) 153 153 153.4 152 154 155 153  BMI 21.34 23.26 23.32 23.11 23.42 23.57 25.46

## 2019-01-04 NOTE — Patient Instructions (Addendum)
Annual exam end July, call if you need me sooner  Please be very careful when moving around as you continue to experience near falls in your home , even though you are very careful   Recent labs are excellent , except kidney function slightly below normal, continue to drink adequate water , and to eat a lot of vegetables and fruit  Nurse will refill amlodipine, pravastatin and triamterene for an additional 6 months   Thank you  for choosing [facility]. We consider it a privelige to serve you.  Delivering excellent health care in a caring and  compassionate way is our goal.  Partnering with you,  so that together we can achieve this goal is our strategy.

## 2019-01-04 NOTE — Assessment & Plan Note (Signed)
Controlled, no change in medication  

## 2019-01-04 NOTE — Assessment & Plan Note (Signed)
Still c/o chronic headache , not disabling, denies any new neurologic symptoms, has severe arthritis in neck whioch likely contributes

## 2019-01-30 NOTE — Progress Notes (Signed)
REVIEWED-NO ADDITIONAL RECOMMENDATIONS. 

## 2019-02-20 ENCOUNTER — Telehealth: Payer: Self-pay | Admitting: Internal Medicine

## 2019-02-20 NOTE — Telephone Encounter (Signed)
Virtual Visit Pre-Appointment Phone Call  "(Name), I am calling you today to discuss your upcoming appointment. We are currently trying to limit exposure to the virus that causes COVID-19 by seeing patients at home rather than in the office."  1. "What is the BEST phone number to call the day of the visit?" - include this in appointment notes  2. Do you have or have access to (through a family member/friend) a smartphone with video capability that we can use for your visit?" a. If yes - list this number in appt notes as cell (if different from BEST phone #) and list the appointment type as a VIDEO visit in appointment notes b. If no - list the appointment type as a PHONE visit in appointment notes  3. Confirm consent - "In the setting of the current Covid19 crisis, you are scheduled for a (phone or video) visit with your provider on (date) at (time).  Just as we do with many in-office visits, in order for you to participate in this visit, we must obtain consent.  If you'd like, I can send this to your mychart (if signed up) or email for you to review.  Otherwise, I can obtain your verbal consent now.  All virtual visits are billed to your insurance company just like a normal visit would be.  By agreeing to a virtual visit, we'd like you to understand that the technology does not allow for your provider to perform an examination, and thus may limit your provider's ability to fully assess your condition. If your provider identifies any concerns that need to be evaluated in person, we will make arrangements to do so.  Finally, though the technology is pretty good, we cannot assure that it will always work on either your or our end, and in the setting of a video visit, we may have to convert it to a phone-only visit.  In either situation, we cannot ensure that we have a secure connection.  Are you willing to proceed?" STAFF: Did the patient verbally acknowledge consent to telehealth visit? Document  YES/NO here: Yes  4. Advise patient to be prepared - "Two hours prior to your appointment, go ahead and check your blood pressure, pulse, oxygen saturation, and your weight (if you have the equipment to check those) and write them all down. When your visit starts, your provider will ask you for this information. If you have an Apple Watch or Kardia device, please plan to have heart rate information ready on the day of your appointment. Please have a pen and paper handy nearby the day of the visit as well."  5. Give patient instructions for MyChart download to smartphone OR [email or web address] as below if video visit (depending on what platform provider is using)  6. Inform patient they will receive a phone call 15 minutes prior to their appointment time (may be from unknown caller ID) so they should be prepared to answer    TELEPHONE CALL NOTE  Candace Cervantes has been deemed a candidate for a follow-up tele-health visit to limit community exposure during the Covid-19 pandemic. I spoke with the patient via phone to ensure availability of phone/video source, confirm preferred email & phone number, and discuss instructions and expectations.  I reminded Candace Cervantes to be prepared with any vital sign and/or heart rhythm information that could potentially be obtained via home monitoring, at the time of her visit. I reminded Candace Cervantes to expect a phone call prior to  her visit.  Terry L Goins 02/20/2019 11:18 AM

## 2019-02-24 ENCOUNTER — Encounter: Payer: Medicare Other | Admitting: Internal Medicine

## 2019-03-01 ENCOUNTER — Other Ambulatory Visit: Payer: Self-pay

## 2019-03-01 ENCOUNTER — Telehealth (INDEPENDENT_AMBULATORY_CARE_PROVIDER_SITE_OTHER): Payer: Medicare Other | Admitting: Internal Medicine

## 2019-03-01 DIAGNOSIS — I10 Essential (primary) hypertension: Secondary | ICD-10-CM

## 2019-03-01 DIAGNOSIS — Z950 Presence of cardiac pacemaker: Secondary | ICD-10-CM | POA: Diagnosis not present

## 2019-03-01 NOTE — Progress Notes (Signed)
Electrophysiology TeleHealth Note   Due to national recommendations of social distancing due to COVID 19, an audio/video telehealth visit is felt to be most appropriate for this patient at this time.  See MyChart message from today for the patient's consent to telehealth for [facility]   Date:  03/01/2019   ID:  Candace Cervantes, DOB 01-06-1925, MRN [id number]  Location: patient's home  Provider location: [address], [place]  Evaluation Performed: Follow-up visit  PCP:  Fayrene Helper, MD  Cardiologist:  Cristopher Peru, MD  Electrophysiologist:  Dr Lovena Le  Chief Complaint:  "I feel fine."  History of Present Illness:    Candace Cervantes is a 85 y.o. female who presents via audio/video conferencing for a telehealth visit today. She is a pleasant woman with sinus node dysfunction, s/p PPM insertion.  She has a h/o atrial arrhythmias. Since last being seen in our clinic, the patient reports doing very well except for a fall in January. She is now using a walker and has not had another fall.  Today, she denies symptoms of palpitations, chest pain, shortness of breath,  lower extremity edema, dizziness, presyncope, or syncope.  The patient is otherwise without complaint today.  The patient denies symptoms of fevers, chills, cough, or new SOB worrisome for COVID 19.  Past Medical History:  Diagnosis Date  . Anxiety disorder   . Arthritis   . CAD (coronary artery disease) 2005   Nonobstructive 40% RCA lesion and normal ejection fraction at cath   . GERD (gastroesophageal reflux disease)   . Hyperlipidemia   . Hypertension   . Osteoporosis   . Pacemaker 2002   Permanent Placement  . Pacemaker 2007   Generator Changed   . Sinoatrial node dysfunction (HCC)   . SSS (sick sinus syndrome) ([place])   . Zenker diverticulum    previously evaluated by Dr. Erik Obey in [id number].     Past Surgical History:  Procedure Laterality Date  . CHOLECYSTECTOMY    . COLONOSCOPY  2003    no polyps.  . CYSTECTOMY     from back of the neck  . ESOPHAGOGASTRODUODENOSCOPY  2008   Dr. Oneida Alar, difficulty passing scope through UES, incomplete fibrous ring at GEJ, hh, multiple benign gastri polyps  . ESOPHAGOGASTRODUODENOSCOPY N/A 10/17/2013   ZENKER'S, STRICTURE: 10-12.8 MM,  NSAID GASTRITIS FG POLYPS  . ESOPHAGOGASTRODUODENOSCOPY (EGD) WITH ESOPHAGEAL DILATION N/A 10/30/2013   SLF: 1. Zenkers diverticulum with a large opening at the cricopharyngeus 2. 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 Innumerable Fundic gland polyps 4. Mild NSAID gastritis  . MALONEY DILATION N/A 10/17/2013   Procedure: MALONEY DILATION;  Surgeon: Danie Binder, MD;  [facility];  Service: Endoscopy;  Laterality: N/A;  with PEDS GASTROSCOPE  . PACEMAKER INSERTION  2002  . PACEMAKER PLACEMENT  2007   replacement   . SAVORY DILATION N/A 10/17/2013   Procedure: SAVORY DILATION;  Surgeon: Danie Binder, MD;  [facility];  Service: Endoscopy;  Laterality: N/A;  with PEDS GASTROSCOPE  . TOTAL ABDOMINAL HYSTERECTOMY  2002    Current Outpatient Medications  Medication Sig Dispense Refill  . ACETAMINOPHEN EXTRA STRENGTH PO Take 650 mg by mouth daily as needed (pain).    Marland Kitchen amLODipine (NORVASC) 2.5 MG tablet One tab at bedtime 30 tablet 5  . citalopram (CELEXA) 10 MG tablet Take 1 tablet (10 mg total) by mouth daily. 30 tablet 5  . Multiple Vitamin (MULTIVITAMIN WITH MINERALS) TABS  tablet Take 1 tablet by mouth daily.    . nitroGLYCERIN (NITROSTAT) 0.4 MG SL tablet Place 1 tablet (0.4 mg total) under the tongue every 5 (five) minutes as needed for chest pain. One tablet under the tongue at onset of chest pains, repeat every five minutes as needed 25 tablet 3  . omeprazole (PRILOSEC) 20 MG capsule TAKE (1) CAPSULE BY MOUTH EVERY DAY. 30 capsule 5  . pravastatin (PRAVACHOL) 40 MG tablet Take 1 tablet (40 mg total) by mouth daily. 30 tablet 5  . triamterene-hydrochlorothiazide (MAXZIDE-25) 37.5-25  MG tablet TAKE ONE TABLET BY MOUTH ONCE DAILY. 30 tablet 11   No current facility-administered medications for this visit.     Allergies:   Patient has no known allergies.   Social History:  The patient  reports that she has never smoked. She has never used smokeless tobacco. She reports that she does not drink alcohol or use drugs.   Family History:  The patient's  family history includes Alcohol abuse in her brother; Cancer in her brother; Colon cancer in an other family member; Diabetes in her sister; Heart disease in her mother; Lung disease in her sister.   ROS:  Please see the history of present illness.   All other systems are personally reviewed and negative.    Exam:    Vital Signs:  There were no vitals taken for this visit.  Well appearing, alert and conversant, regular work of breathing,  good skin color Eyes- anicteric, neuro- grossly intact, skin- no apparent rash or lesions or cyanosis, mouth- oral mucosa is pink   Labs/Other Tests and Data Reviewed:    Recent Labs: 07/08/2018: Hemoglobin 12.2; Platelets 197; TSH 2.50 12/28/2018: ALT 25; BUN 23; Creat 1.07; Potassium 3.6; Sodium 140   Wt Readings from Last 3 Encounters:  10/27/18 153 lb (69.4 kg)  09/19/18 153 lb (69.4 kg)  07/25/18 153 lb 6.4 oz (69.6 kg)     Other studies personally reviewed   ASSESSMENT & PLAN:    1.  Sinus node dysfunction - she is asymptomatic, s/p pPPM insertion. 2. PPM - her St. Jude DDD PM has not been check in many months. We will need for her to return in 3 months for an in office check. 3. HTN - her blood pressure has been high previously. She reports that it has been ok recently.  4. COVID 19 screen The patient denies symptoms of COVID 19 at this time.  The importance of social distancing was discussed today.  Follow-up:  3 months for PPM check Next remote: n/a  Current medicines are reviewed at length with the patient today.   The patient does not have concerns regarding her  medicines.  The following changes were made today:  none  Labs/ tests ordered today include: none No orders of the defined types were placed in this encounter.    Patient Risk:  after full review of this patients clinical status, I feel that they are at moderate risk at this time.  Today, I have spent 15 minutes with the patient with telehealth technology discussing all of the above.    Signed, Cristopher Peru, MD  03/01/2019 9:31 AM     [place] [address] [phone] (office) [phone] (fax)

## 2019-03-01 NOTE — Patient Instructions (Signed)
Medication Instructions:  Your physician recommends that you continue on your current medications as directed. Please refer to the Current Medication list given to you today.   Labwork: NONE  Testing/Procedures: NONE  Follow-Up: Your physician recommends that you schedule a follow-up appointment in: 3 Months with Dr. Lovena Le.    Any Other Special Instructions Will Be Listed Below (If Applicable).     If you need a refill on your cardiac medications before your next appointment, please call your pharmacy.  Thank you for choosing [facility]!

## 2019-03-17 ENCOUNTER — Encounter: Payer: Self-pay | Admitting: *Deleted

## 2019-04-19 IMAGING — DX DG CERVICAL SPINE 2 OR 3 VIEWS
4 series · 4 of 4 positions shown · non-contrast
Comparison: None.

CLINICAL DATA: Occipital headache.

EXAM:
CERVICAL SPINE - 2-3 VIEW

[c-spine lat (1 of 2)]
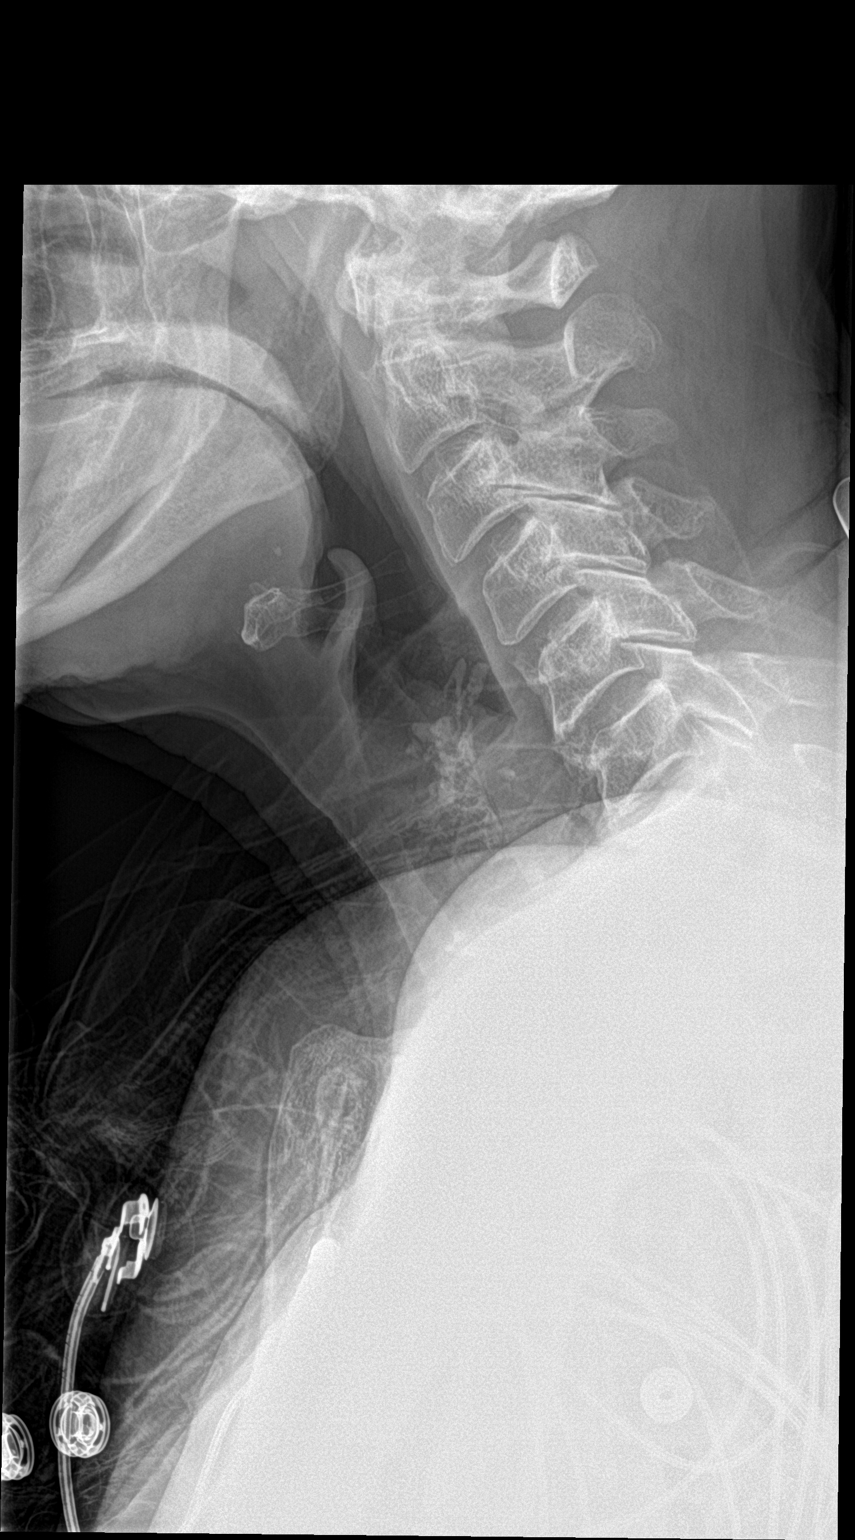

[c-spine ap]
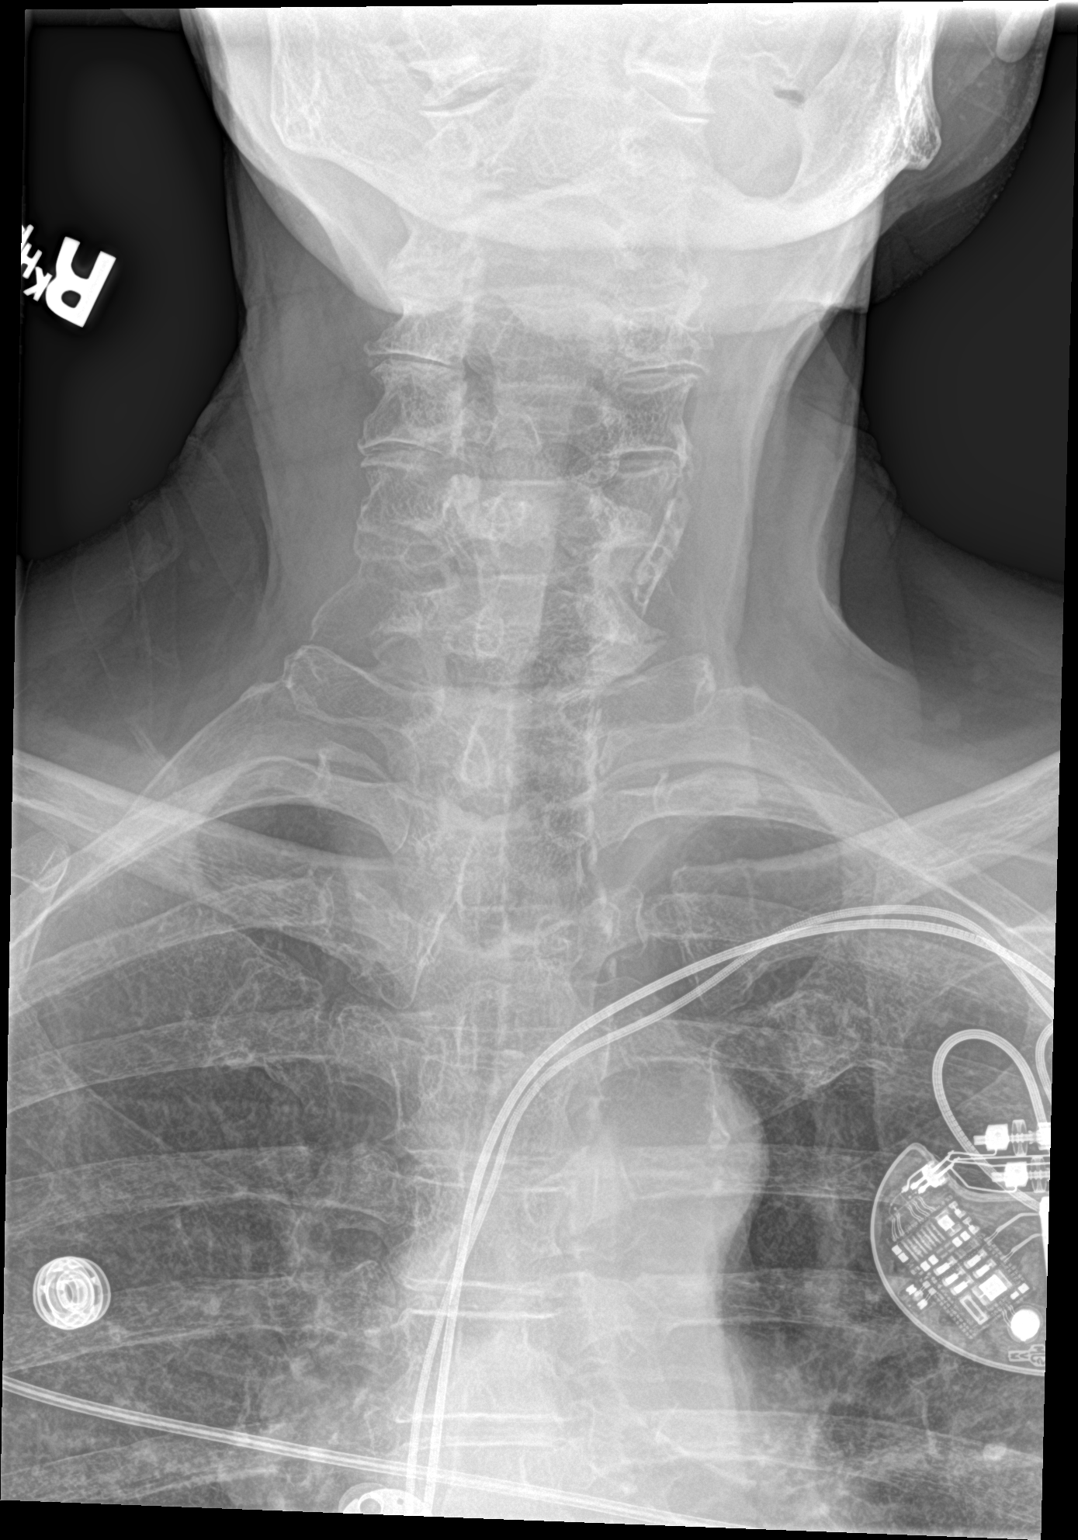

[c-spine swimmers trauma]
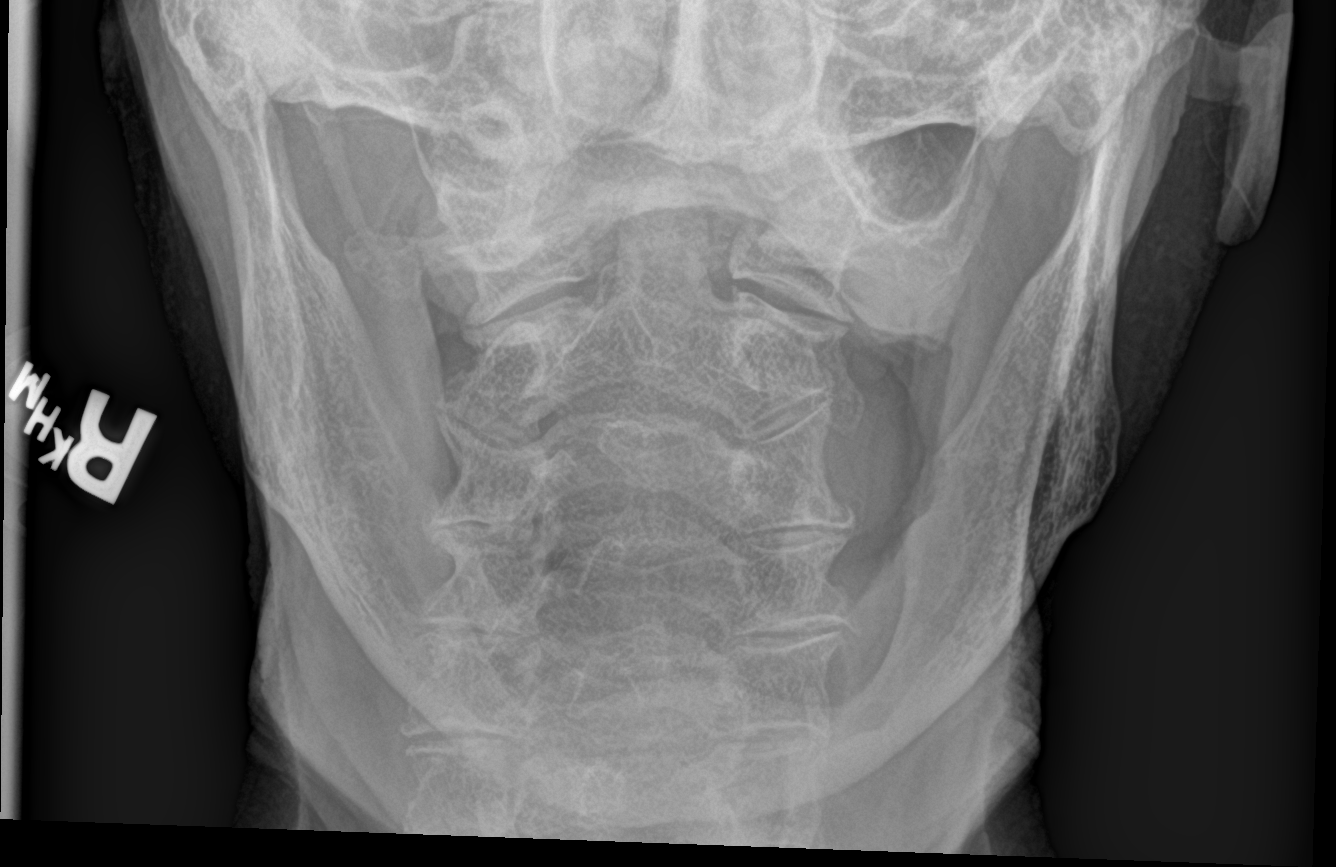

[c-spine lat (2 of 2)]
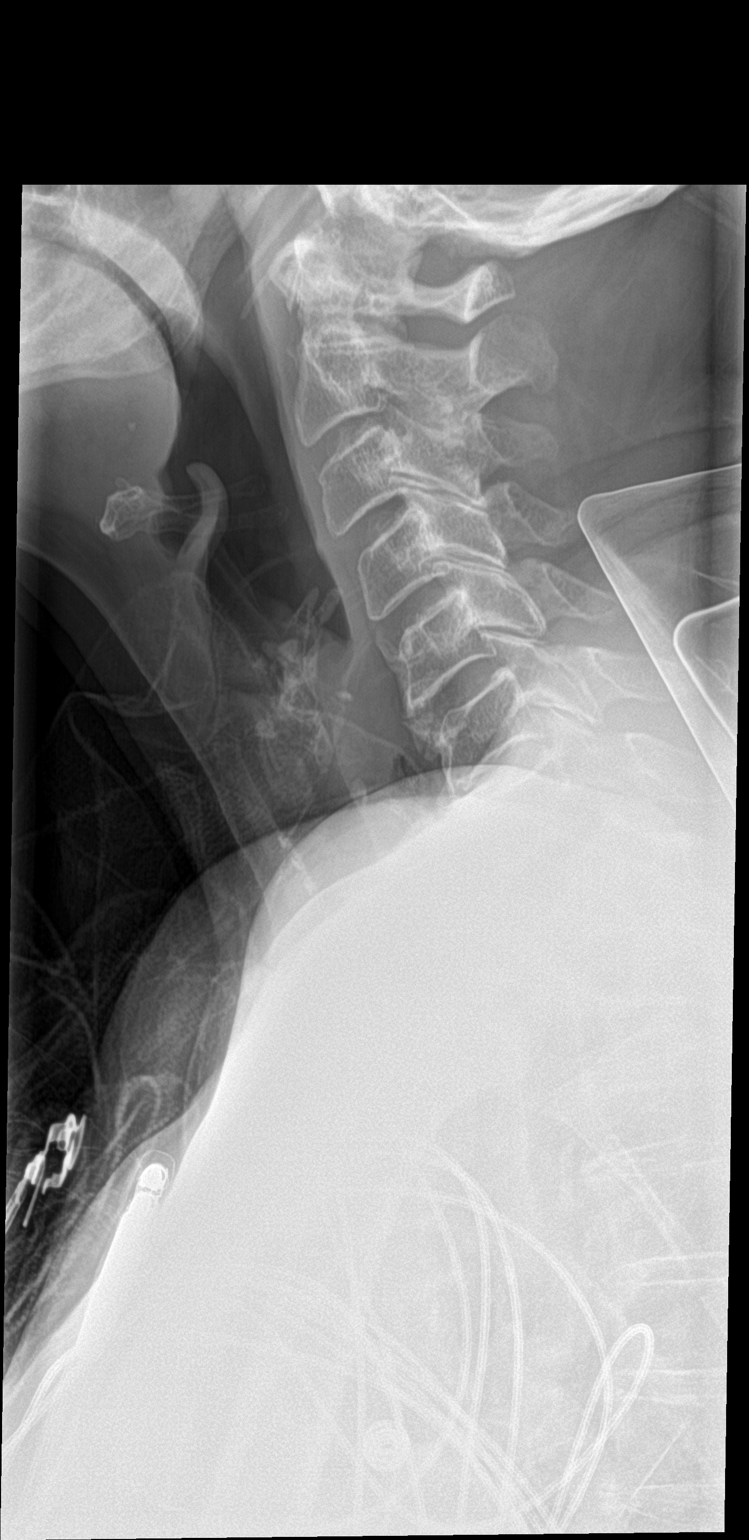

[4 of 4 positions shown; findings below may reference images not displayed]

FINDINGS: O O degenerative facet disease diffusely. Mild degenerative disc
disease in the lower cervical spine with anterior spurring. Normal
alignment. Prevertebral soft tissues are normal. No fracture.
IMPRESSION: Spondylosis.  No acute findings.

## 2019-04-19 IMAGING — DX DG CHEST 2V
2 series · 2 of 2 positions shown · non-contrast
Comparison: 03/26/2016

CLINICAL DATA: Central chest pain.

EXAM:
CHEST  2 VIEW

[chest lat]
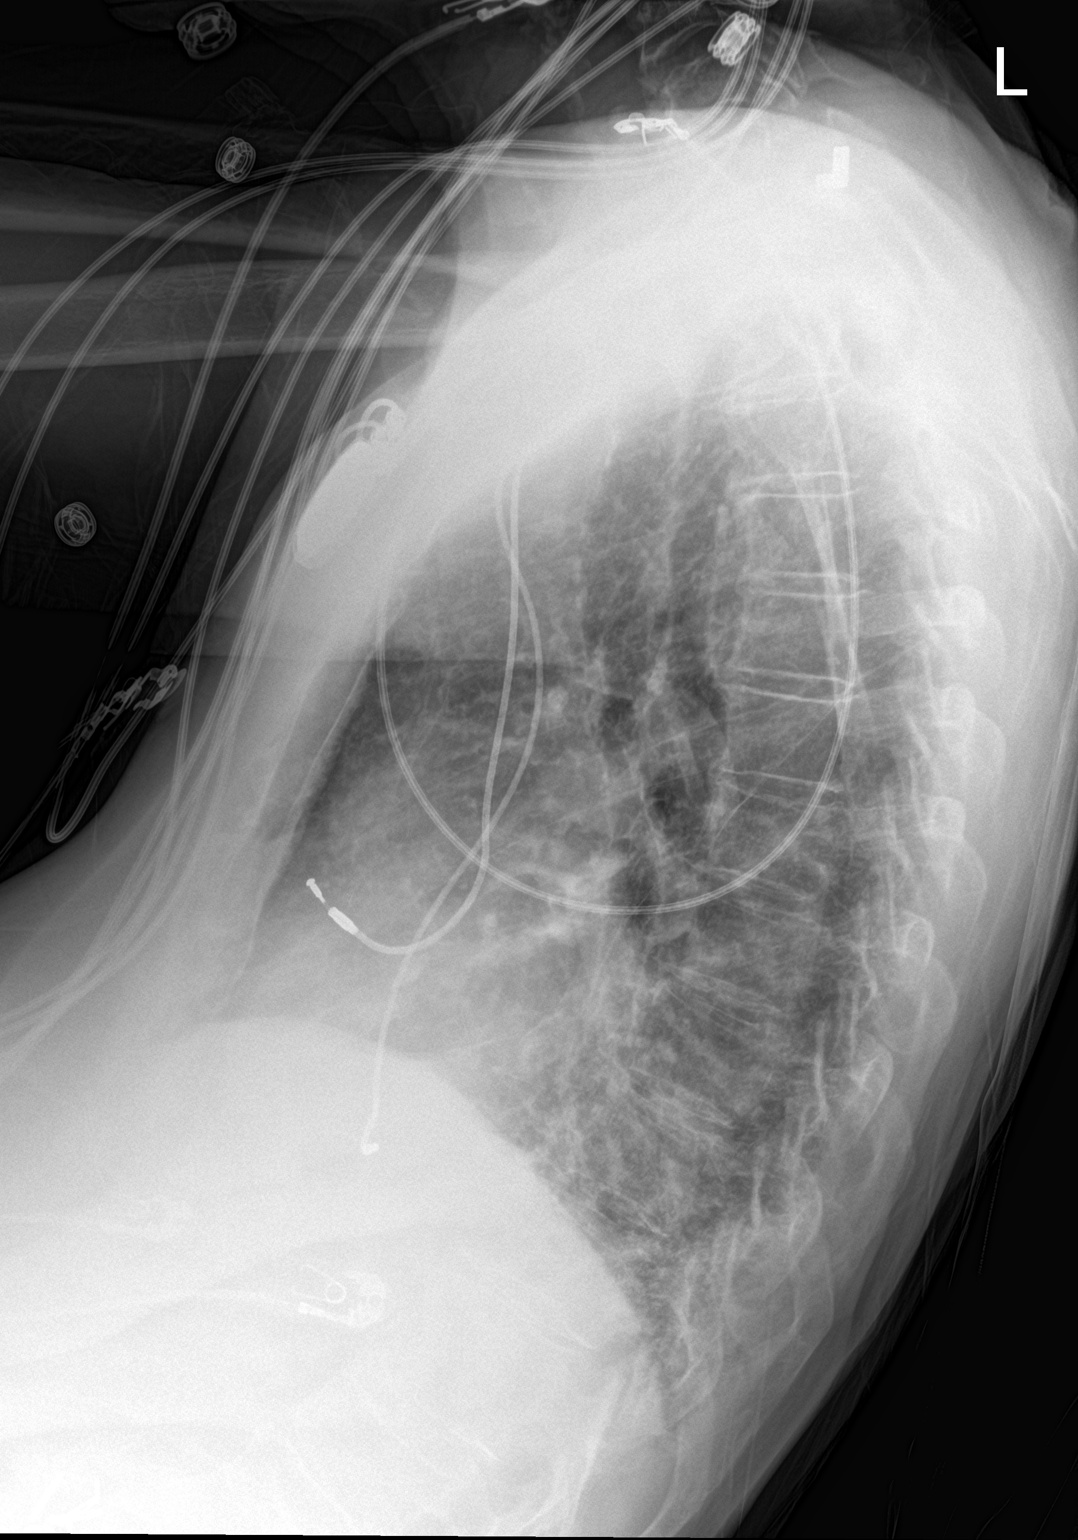

[chest ap]
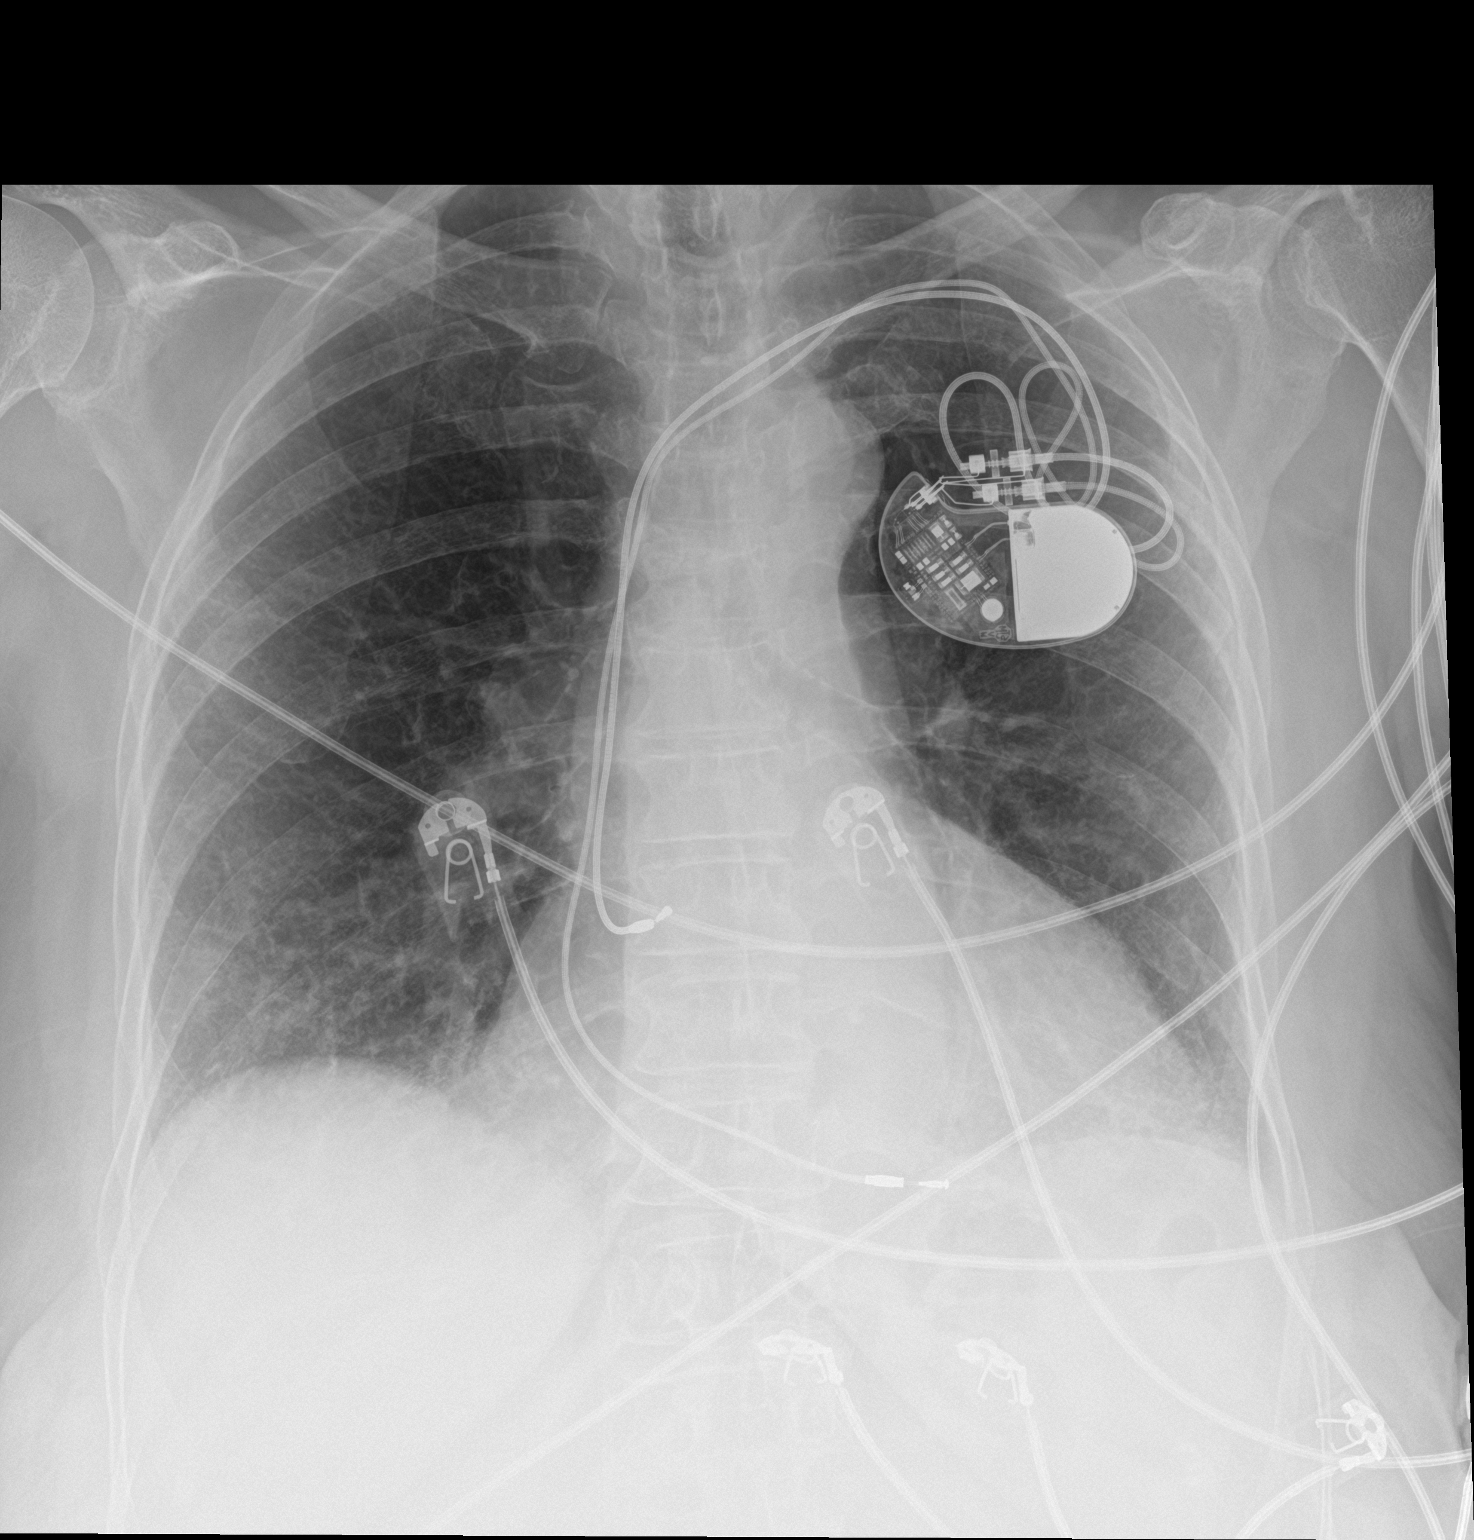

[2 of 2 positions shown; findings below may reference images not displayed]

FINDINGS: Left-sided pacemaker remains in place, leads projecting over the
right atrium and ventricle. Stable heart size and mediastinal
contours with mild cardiomegaly. Increased interstitial bibasilar
opacities are stable, may be related scarring or interstitial lung
disease. No consolidation, pleural effusion, pulmonary edema or
pneumothorax. No acute osseous abnormalities.
IMPRESSION: 1. No acute abnormality.
2. Chronic basilar interstitial opacities.

## 2019-05-01 ENCOUNTER — Ambulatory Visit (INDEPENDENT_AMBULATORY_CARE_PROVIDER_SITE_OTHER): Payer: Medicare Other | Admitting: Family Medicine

## 2019-05-01 ENCOUNTER — Encounter: Payer: Self-pay | Admitting: Family Medicine

## 2019-05-01 ENCOUNTER — Other Ambulatory Visit: Payer: Self-pay

## 2019-05-01 VITALS — BP 136/82 | HR 64 | Temp 98.0°F | Resp 15 | Ht 68.0 in | Wt 154.0 lb

## 2019-05-01 DIAGNOSIS — R7302 Impaired glucose tolerance (oral): Secondary | ICD-10-CM

## 2019-05-01 DIAGNOSIS — E785 Hyperlipidemia, unspecified: Secondary | ICD-10-CM

## 2019-05-01 DIAGNOSIS — E559 Vitamin D deficiency, unspecified: Secondary | ICD-10-CM

## 2019-05-01 DIAGNOSIS — Z0000 Encounter for general adult medical examination without abnormal findings: Secondary | ICD-10-CM

## 2019-05-01 DIAGNOSIS — I10 Essential (primary) hypertension: Secondary | ICD-10-CM

## 2019-05-01 NOTE — Assessment & Plan Note (Addendum)
Annual exam as documented. Counseling done  re healthy lifestyle involving healthy food choice .The importance of adequate sleep also discussed. Regular seat belt use and home safety, is also discussed. Changes in health habits are decided on by the patient with goals and time frames  set for achieving them. Immunization and cancer screening needs are specifically addressed at this visit.

## 2019-05-01 NOTE — Progress Notes (Signed)
    Candace Cervantes     MRN: [id number]      DOB: 1925/05/12  HPI: Patient is in for annual physical exam. No other health concerns are expressed or addressed at the visit. Immunization is up to date as is her lab work C/o several near falls, but still inconsistently using her walker or cane  PE: BP 136/82   Pulse 64   Temp 98 F (36.7 C) (Temporal)   Resp 15   Ht 5\' 8"  (1.727 m)   Wt 154 lb (69.9 kg)   SpO2 95%   BMI 23.42 kg/m    Pleasant  female, alert and oriented x 3, in no cardio-pulmonary distress. Afebrile. HEENT No facial trauma or asymetry. Sinuses non tender.  Extra occullar muscles intact,. External ears normal, . Neck: decreased ROM, no adenopathy,JVD or thyromegaly.No bruits.  Chest: Clear to ascultation bilaterally.No crackles or wheezes. Non tender to palpation  Breast: Not examined, normal mammogram in 10/2018 Cardiovascular system; Heart sounds normal,  S1 and  S2 ,no S3.  No murmur, or thrill. Apical beat not displaced Peripheral pulses normal.  Abdomen: Soft, non tender, no organomegaly or masses. . No guarding, tenderness or rebound.   Musculoskeletal exam: Decreased  ROM of spine, hips , shoulders and knees.  deformity ,swelling or crepitus noted. minimal muscle wasting and  atrophy.   Neurologic: Cranial nerves 2 to 12 intact.  disturbance in gait. No tremor.  Skin: Intact, no ulceration, erythema , scaling or rash noted. Pigmentation normal throughout  Psych; Normal mood and affect. Judgement and concentration normal   Assessment & Plan:  Annual physical exam Annual exam as documented. Counseling done  re healthy lifestyle involving healthy food choice .The importance of adequate sleep also discussed. Regular seat belt use and home safety, is also discussed. Changes in health habits are decided on by the patient with goals and time frames  set for achieving them. Immunization and cancer screening needs are specifically  addressed at this visit.

## 2019-05-01 NOTE — Patient Instructions (Signed)
Wellness due October 22 or after please schedule.  Exam today is good vision screening will be done at the end of the visit.  Fasting lab work October 5 or after CBC lipids CMP and EGFR TSH hemoglobin A1c and vitamin D.  Please use your walker or cane in the home at all times since you are somewhat unsteady we do not want you to fall.  Continue to practice good health habits reduce your exposure to COVID-19.  Thank you for choosing [facility] we considere it a  privilege to serve you thank you

## 2019-05-02 ENCOUNTER — Other Ambulatory Visit: Payer: Self-pay | Admitting: Family Medicine

## 2019-05-02 ENCOUNTER — Other Ambulatory Visit: Payer: Self-pay | Admitting: Internal Medicine

## 2019-05-04 ENCOUNTER — Other Ambulatory Visit: Payer: Self-pay | Admitting: Family Medicine

## 2019-06-20 ENCOUNTER — Other Ambulatory Visit: Payer: Self-pay

## 2019-06-20 ENCOUNTER — Encounter: Payer: Self-pay | Admitting: Internal Medicine

## 2019-06-20 ENCOUNTER — Encounter: Payer: Medicare Other | Admitting: Internal Medicine

## 2019-06-20 ENCOUNTER — Ambulatory Visit (INDEPENDENT_AMBULATORY_CARE_PROVIDER_SITE_OTHER): Payer: Medicare Other | Admitting: Internal Medicine

## 2019-06-20 VITALS — BP 154/78 | HR 65 | Temp 96.8°F | Ht 71.0 in | Wt 156.0 lb

## 2019-06-20 DIAGNOSIS — I480 Paroxysmal atrial fibrillation: Secondary | ICD-10-CM

## 2019-06-20 DIAGNOSIS — I10 Essential (primary) hypertension: Secondary | ICD-10-CM

## 2019-06-20 NOTE — Patient Instructions (Signed)
Medication Instructions: Your physician recommends that you continue on your current medications as directed. Please refer to the Current Medication list given to you today.   Labwork: None today  Procedures/Testing: None today  Follow-Up: 3 months for pacer check, 6 months with Dr.Taylor  Any Additional Special Instructions Will Be Listed Below (If Applicable).     If you need a refill on your cardiac medications before your next appointment, please call your pharmacy.      Thank you for choosing [facility] !

## 2019-06-20 NOTE — Progress Notes (Signed)
HPI Candace Cervantes is a 85 y.o. female who returns for followup. She is a pleasant woman with sinus node dysfunction, s/p PPM insertion.  She has a h/o atrial arrhythmias. Since last being seen in our clinic, the patient reports doing very well except for a fall in January. She is now using a walker and has not had another fall. Since her last visit she has continued to slowly weaken. She has become more sedentary.  No Known Allergies   Current Outpatient Medications  Medication Sig Dispense Refill  . ACETAMINOPHEN EXTRA STRENGTH PO Take 650 mg by mouth daily as needed (pain).    Marland Kitchen amLODipine (NORVASC) 2.5 MG tablet One tab at bedtime 30 tablet 5  . citalopram (CELEXA) 10 MG tablet Take 1 tablet (10 mg total) by mouth daily. 30 tablet 5  . Multiple Vitamin (MULTIVITAMIN WITH MINERALS) TABS tablet Take 1 tablet by mouth daily.    . nitroGLYCERIN (NITROSTAT) 0.4 MG SL tablet Place 1 tablet (0.4 mg total) under the tongue every 5 (five) minutes as needed for chest pain. One tablet under the tongue at onset of chest pains, repeat every five minutes as needed 25 tablet 3  . omeprazole (PRILOSEC) 20 MG capsule TAKE (1) CAPSULE BY MOUTH EVERY DAY. 30 capsule 0  . pravastatin (PRAVACHOL) 40 MG tablet Take 1 tablet (40 mg total) by mouth daily. 30 tablet 5  . triamterene-hydrochlorothiazide (MAXZIDE-25) 37.5-25 MG tablet TAKE ONE TABLET BY MOUTH ONCE DAILY. 30 tablet 11   No current facility-administered medications for this visit.      Past Medical History:  Diagnosis Date  . Anxiety disorder   . Arthritis   . CAD (coronary artery disease) 2005   Nonobstructive 40% RCA lesion and normal ejection fraction at cath   . GERD (gastroesophageal reflux disease)   . Hyperlipidemia   . Hypertension   . Osteoporosis   . Pacemaker 2002   Permanent Placement  . Pacemaker 2007   Generator Changed   . Sinoatrial node dysfunction (HCC)   . SSS (sick sinus syndrome) ([place])   . Zenker  diverticulum    previously evaluated by Dr. Erik Obey in [id number].     ROS:   All systems reviewed and negative except as noted in the HPI.   Past Surgical History:  Procedure Laterality Date  . CHOLECYSTECTOMY    . COLONOSCOPY  2003   no polyps.  . CYSTECTOMY     from back of the neck  . ESOPHAGOGASTRODUODENOSCOPY  2008   Dr. Oneida Alar, difficulty passing scope through UES, incomplete fibrous ring at GEJ, hh, multiple benign gastri polyps  . ESOPHAGOGASTRODUODENOSCOPY N/A 10/17/2013   ZENKER'S, STRICTURE: 10-12.8 MM,  NSAID GASTRITIS FG POLYPS  . ESOPHAGOGASTRODUODENOSCOPY (EGD) WITH ESOPHAGEAL DILATION N/A 10/30/2013   SLF: 1. Zenkers diverticulum with a large opening at the cricopharyngeus 2. 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 Innumerable Fundic gland polyps 4. Mild NSAID gastritis  . MALONEY DILATION N/A 10/17/2013   Procedure: MALONEY DILATION;  Surgeon: Danie Binder, MD;  [facility];  Service: Endoscopy;  Laterality: N/A;  with PEDS GASTROSCOPE  . PACEMAKER INSERTION  2002  . PACEMAKER PLACEMENT  2007   replacement   . SAVORY DILATION N/A 10/17/2013   Procedure: SAVORY DILATION;  Surgeon: Danie Binder, MD;  [facility];  Service: Endoscopy;  Laterality: N/A;  with PEDS GASTROSCOPE  . TOTAL ABDOMINAL HYSTERECTOMY  2002     Family History  Problem  Relation Age of Onset  . Heart disease Mother   . Lung disease Sister   . Alcohol abuse Brother   . Cancer Brother        possible prostate  . Diabetes Sister   . Colon cancer Other        sibling, age 62     Social History   Socioeconomic History  . Marital status: Married    Spouse name: Not on file  . Number of children: 2  . Years of education: Not on file  . Highest education level: 12th grade  Occupational History  . Not on file  Social Needs  . Financial resource strain: Not hard at all  . Food insecurity    Worry: Never true    Inability: Never true  . Transportation needs     Medical: No    Non-medical: No  Tobacco Use  . Smoking status: Never Smoker  . Smokeless tobacco: Never Used  Substance and Sexual Activity  . Alcohol use: No    Alcohol/week: 0.0 standard drinks  . Drug use: No  . Sexual activity: Not Currently  Lifestyle  . Physical activity    Days per week: 0 days    Minutes per session: 0 min  . Stress: Not at all  Relationships  . Social connections    Talks on phone: More than three times a week    Gets together: More than three times a week    Attends religious service: More than 4 times per year    Active member of club or organization: Yes    Attends meetings of clubs or organizations: More than 4 times per year    Relationship status: Widowed  . Intimate partner violence    Fear of current or ex partner: No    Emotionally abused: No    Physically abused: No    Forced sexual activity: No  Other Topics Concern  . Not on file  Social History Narrative  . Not on file     BP (!) 154/78   Pulse 65   Temp (!) 96.8 F (36 C) (Temporal)   Ht 5\' 11"  (1.803 m)   Wt 156 lb (70.8 kg)   SpO2 95%   BMI 21.76 kg/m   Physical Exam:  Elderly appearing woman, NAD HEENT: Unremarkable Neck:  6 cm JVD, no thyromegally Lymphatics:  No adenopathy Back:  No CVA tenderness Lungs:  Clear with no wheezes HEART:  Regular rate rhythm, no murmurs, no rubs, no clicks Abd:  soft, positive bowel sounds, no organomegally, no rebound, no guarding Ext:  2 plus pulses, no edema, no cyanosis, no clubbing Skin:  No rashes no nodules Neuro:  CN II through XII intact, motor grossly intact  DEVICE  Normal device function.  See PaceArt for details.   Assess/Plan: 1. Sinus node dysfunction - she is asymptomatic, s/p PPM insertion. 2.  HTN - her SBP is high today but is better at home. No change in meds. I encouraged her to maintain a low sodium diet. 3. PPM - her St. Jude DDD PM is working normally except for some atrial lead noise which is not new.  She is about a year from [facility].  Mikle Bosworth.D.

## 2019-07-06 ENCOUNTER — Other Ambulatory Visit: Payer: Self-pay

## 2019-07-06 ENCOUNTER — Ambulatory Visit (INDEPENDENT_AMBULATORY_CARE_PROVIDER_SITE_OTHER): Payer: Medicare Other

## 2019-07-06 DIAGNOSIS — E785 Hyperlipidemia, unspecified: Secondary | ICD-10-CM | POA: Diagnosis not present

## 2019-07-06 DIAGNOSIS — Z23 Encounter for immunization: Secondary | ICD-10-CM

## 2019-07-06 DIAGNOSIS — R7302 Impaired glucose tolerance (oral): Secondary | ICD-10-CM | POA: Diagnosis not present

## 2019-07-06 DIAGNOSIS — E559 Vitamin D deficiency, unspecified: Secondary | ICD-10-CM | POA: Diagnosis not present

## 2019-07-06 DIAGNOSIS — I10 Essential (primary) hypertension: Secondary | ICD-10-CM | POA: Diagnosis not present

## 2019-07-07 LAB — CBC
HCT: 36.7 % (ref 35.0–45.0)
Hemoglobin: 12.2 g/dL (ref 11.7–15.5)
MCH: 31.5 pg (ref 27.0–33.0)
MCHC: 33.2 g/dL (ref 32.0–36.0)
MCV: 94.8 fL (ref 80.0–100.0)
MPV: 10.4 fL (ref 7.5–12.5)
Platelets: 186 10*3/uL (ref 140–400)
RBC: 3.87 10*6/uL (ref 3.80–5.10)
RDW: 13.1 % (ref 11.0–15.0)
WBC: 4.3 10*3/uL (ref 3.8–10.8)

## 2019-07-07 LAB — COMPLETE METABOLIC PANEL WITH GFR
AG Ratio: 1.4 (calc) (ref 1.0–2.5)
ALT: 22 U/L (ref 6–29)
AST: 28 U/L (ref 10–35)
Albumin: 4.2 g/dL (ref 3.6–5.1)
Alkaline phosphatase (APISO): 53 U/L (ref 37–153)
BUN/Creatinine Ratio: 29 (calc) — ABNORMAL HIGH (ref 6–22)
BUN: 27 mg/dL — ABNORMAL HIGH (ref 7–25)
CO2: 31 mmol/L (ref 20–32)
Calcium: 10.1 mg/dL (ref 8.6–10.4)
Chloride: 103 mmol/L (ref 98–110)
Creat: 0.92 mg/dL — ABNORMAL HIGH (ref 0.60–0.88)
GFR, Est African American: 63 mL/min/{1.73_m2} (ref >=60)
GFR, Est Non African American: 54 mL/min/{1.73_m2} — ABNORMAL LOW (ref >=60)
Globulin: 3.1 g/dL (calc) (ref 1.9–3.7)
Glucose, Bld: 99 mg/dL (ref 65–99)
Potassium: 4 mmol/L (ref 3.5–5.3)
Sodium: 141 mmol/L (ref 135–146)
Total Bilirubin: 0.5 mg/dL (ref 0.2–1.2)
Total Protein: 7.3 g/dL (ref 6.1–8.1)

## 2019-07-07 LAB — LIPID PANEL
Cholesterol: 183 mg/dL (ref <200)
HDL: 78 mg/dL (ref >=50)
LDL Cholesterol (Calc): 89 mg/dL (calc)
Non-HDL Cholesterol (Calc): 105 mg/dL (calc) (ref <130)
Total CHOL/HDL Ratio: 2.3 (calc) (ref <5.0)
Triglycerides: 72 mg/dL (ref <150)

## 2019-07-07 LAB — HEMOGLOBIN A1C
Hgb A1c MFr Bld: 5.8 % of total Hgb — ABNORMAL HIGH (ref <5.7)
Mean Plasma Glucose: 120 (calc)
eAG (mmol/L): 6.6 (calc)

## 2019-07-07 LAB — VITAMIN D 25 HYDROXY (VIT D DEFICIENCY, FRACTURES): Vit D, 25-Hydroxy: 33 ng/mL (ref 30–100)

## 2019-07-07 LAB — TSH: TSH: 4.25 mIU/L (ref 0.40–4.50)

## 2019-07-26 ENCOUNTER — Ambulatory Visit: Payer: Medicare Other

## 2019-07-27 ENCOUNTER — Encounter (INDEPENDENT_AMBULATORY_CARE_PROVIDER_SITE_OTHER): Payer: Self-pay

## 2019-07-27 ENCOUNTER — Ambulatory Visit (INDEPENDENT_AMBULATORY_CARE_PROVIDER_SITE_OTHER): Payer: Medicare Other | Admitting: Family Medicine

## 2019-07-27 ENCOUNTER — Other Ambulatory Visit: Payer: Self-pay

## 2019-07-27 ENCOUNTER — Encounter: Payer: Self-pay | Admitting: Family Medicine

## 2019-07-27 VITALS — BP 154/78 | HR 65 | Resp 15 | Ht 71.0 in | Wt 156.0 lb

## 2019-07-27 DIAGNOSIS — Z0000 Encounter for general adult medical examination without abnormal findings: Secondary | ICD-10-CM

## 2019-07-27 NOTE — Progress Notes (Signed)
Subjective:   Candace Cervantes is a 85 y.o. female who presents for Medicare Annual (Subsequent) preventive examination.  Location of Patient: Home Location of Provider: Telehealth Consent was obtain for visit to be over via telehealth.  I verified that I am speaking with the correct person using two identifiers.   Review of Systems:    Cardiac Risk Factors include: advanced age (>24men, >59 women);hypertension;dyslipidemia     Objective:     Vitals: BP (!) 154/78   Pulse 65   Resp 15   Ht 5\' 11"  (1.803 m)   Wt 156 lb (70.8 kg)   BMI 21.76 kg/m   Body mass index is 21.76 kg/m.  Advanced Directives 10/27/2018 07/25/2018 02/14/2018 07/12/2017 04/28/2017 02/20/2017 02/20/2017  Does Patient Have a Medical Advance Directive? No No No No Yes Yes Yes  Type of Advance Directive - - - - Press photographer;Living will [place];Living will [place];Living will  Does patient want to make changes to medical advance directive? - - - - - No - Patient declined -  Copy of [place] in Chart? - - - - No - copy requested No - copy requested No - copy requested  Would patient like information on creating a medical advance directive? No - Patient declined Yes (ED - Information included in AVS) - (No Data) - - -  Pre-existing out of facility DNR order (yellow form or pink MOST form) - - - - - - -    Tobacco Social History   Tobacco Use  Smoking Status Never Smoker  Smokeless Tobacco Never Used     Counseling given: Yes   Clinical Intake:  Pre-visit preparation completed: Yes  Pain : No/denies pain Pain Score: 0-No pain     BMI - recorded: 21.76 Nutritional Status: BMI of 19-24  Normal Nutritional Risks: None Diabetes: No  How often do you need to have someone help you when you read instructions, pamphlets, or other written materials from your doctor or pharmacy?: 1 - Never What is the last grade level you  completed in school?: 12  Interpreter Needed?: No     Past Medical History:  Diagnosis Date  . Anxiety disorder   . Arthritis   . CAD (coronary artery disease) 2005   Nonobstructive 40% RCA lesion and normal ejection fraction at cath   . GERD (gastroesophageal reflux disease)   . Hyperlipidemia   . Hypertension   . Osteoporosis   . Pacemaker 2002   Permanent Placement  . Pacemaker 2007   Generator Changed   . Sinoatrial node dysfunction (HCC)   . SSS (sick sinus syndrome) ([place])   . Zenker diverticulum    previously evaluated by Dr. Erik Obey in [id number].    Past Surgical History:  Procedure Laterality Date  . CHOLECYSTECTOMY    . COLONOSCOPY  2003   no polyps.  . CYSTECTOMY     from back of the neck  . ESOPHAGOGASTRODUODENOSCOPY  2008   Dr. Oneida Alar, difficulty passing scope through UES, incomplete fibrous ring at GEJ, hh, multiple benign gastri polyps  . ESOPHAGOGASTRODUODENOSCOPY N/A 10/17/2013   ZENKER'S, STRICTURE: 10-12.8 MM,  NSAID GASTRITIS FG POLYPS  . ESOPHAGOGASTRODUODENOSCOPY (EGD) WITH ESOPHAGEAL DILATION N/A 10/30/2013   SLF: 1. Zenkers diverticulum with a large opening at the cricopharyngeus 2. 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 Innumerable Fundic gland polyps 4. Mild NSAID gastritis  . MALONEY DILATION N/A 10/17/2013   Procedure: MALONEY DILATION;  Surgeon: Carlyon Prows  Rexene Edison, MD;  [facility];  Service: Endoscopy;  Laterality: N/A;  with PEDS GASTROSCOPE  . PACEMAKER INSERTION  2002  . PACEMAKER PLACEMENT  2007   replacement   . SAVORY DILATION N/A 10/17/2013   Procedure: SAVORY DILATION;  Surgeon: Danie Binder, MD;  Location: [facility];  Service: Endoscopy;  Laterality: N/A;  with PEDS GASTROSCOPE  . TOTAL ABDOMINAL HYSTERECTOMY  2002   Family History  Problem Relation Age of Onset  . Heart disease Mother   . Lung disease Sister   . Alcohol abuse Brother   . Cancer Brother        possible prostate  . Diabetes Sister   . Colon cancer Other         sibling, age 33   Social History   Socioeconomic History  . Marital status: Married    Spouse name: Not on file  . Number of children: 2  . Years of education: Not on file  . Highest education level: 12th grade  Occupational History  . Not on file  Social Needs  . Financial resource strain: Not hard at all  . Food insecurity    Worry: Never true    Inability: Never true  . Transportation needs    Medical: No    Non-medical: No  Tobacco Use  . Smoking status: Never Smoker  . Smokeless tobacco: Never Used  Substance and Sexual Activity  . Alcohol use: No    Alcohol/week: 0.0 standard drinks  . Drug use: No  . Sexual activity: Not Currently  Lifestyle  . Physical activity    Days per week: 0 days    Minutes per session: 0 min  . Stress: Not at all  Relationships  . Social connections    Talks on phone: More than three times a week    Gets together: More than three times a week    Attends religious service: More than 4 times per year    Active member of club or organization: Yes    Attends meetings of clubs or organizations: More than 4 times per year    Relationship status: Widowed  Other Topics Concern  . Not on file  Social History Narrative  . Not on file    Outpatient Encounter Medications as of 07/27/2019  Medication Sig  . acetaminophen (TYLENOL) 325 MG tablet Take 325 mg by mouth every 6 (six) hours as needed.  Marland Kitchen amLODipine (NORVASC) 2.5 MG tablet One tab at bedtime  . citalopram (CELEXA) 10 MG tablet Take 1 tablet (10 mg total) by mouth daily.  . Multiple Vitamin (MULTIVITAMIN WITH MINERALS) TABS tablet Take 1 tablet by mouth daily.  . nitroGLYCERIN (NITROSTAT) 0.4 MG SL tablet Place 1 tablet (0.4 mg total) under the tongue every 5 (five) minutes as needed for chest pain. One tablet under the tongue at onset of chest pains, repeat every five minutes as needed  . omeprazole (PRILOSEC) 20 MG capsule TAKE (1) CAPSULE BY MOUTH EVERY DAY.  . pravastatin  (PRAVACHOL) 40 MG tablet Take 1 tablet (40 mg total) by mouth daily.  Marland Kitchen triamterene-hydrochlorothiazide (MAXZIDE-25) 37.5-25 MG tablet TAKE ONE TABLET BY MOUTH ONCE DAILY.  . [DISCONTINUED] ACETAMINOPHEN EXTRA STRENGTH PO Take 650 mg by mouth daily as needed (pain).   No facility-administered encounter medications on file as of 07/27/2019.     Activities of Daily Living In your present state of health, do you have any difficulty performing the following activities: 07/27/2019  Hearing?  N  Vision? N  Difficulty concentrating or making decisions? Y  Comment a little  Walking or climbing stairs? Y  Dressing or bathing? N  Doing errands, shopping? Y  Preparing Food and eating ? N  Using the Toilet? N  In the past six months, have you accidently leaked urine? N  Do you have problems with loss of bowel control? N  Managing your Medications? N  Managing your Finances? Y  Housekeeping or managing your Housekeeping? N  Some recent data might be hidden    Patient Care Team: Fayrene Helper, MD as PCP - General Evans Lance, MD as PCP - Cardiology (Cardiology) Lendon Colonel, NP as Nurse Practitioner (Nurse Practitioner) Lattie Haw, Cristopher Estimable, MD as Attending Physician (Cardiology) Danie Binder, MD as Consulting Physician (Gastroenterology) Rutherford Guys, MD as Consulting Physician (Ophthalmology) Allyn Kenner, MD as Consulting Physician (Dermatology)    Assessment:   This is a routine wellness examination for Suvi.  Exercise Activities and Dietary recommendations Current Exercise Habits: Home exercise routine, Type of exercise: calisthenics;stretching;walking, Time (Minutes): 20, Frequency (Times/Week): 7, Weekly Exercise (Minutes/Week): 140, Intensity: Mild, Exercise limited by: None identified  Goals    . Exercise 3x per week (30 min per time)     Recommend starting a routine exercise program at least 3 days a week for 30-45 minutes at a time as tolerated.      .  Patient Stated (pt-stated)     Will do more chair exercises during free time    . Prevent falls     Will do the chair exercises to help strengthen her legs and take her time and always use walker when ambulating        Fall Risk Fall Risk  07/27/2019 05/01/2019 01/04/2019 09/19/2018 07/25/2018  Falls in the past year? 1 0 0 0 No  Number falls in past yr: 1 0 0 - -  Injury with Fall? 1 0 - - -  Risk for fall due to : - - History of fall(s);Impaired balance/gait;Impaired mobility - -  Follow up - - - - -   Is the patient's home free of loose throw rugs in walkways, pet beds, electrical cords, etc?   yes      Grab bars in the bathroom? yes      Handrails on the stairs?   yes      Adequate lighting?   yes     Depression Screen PHQ 2/9 Scores 07/27/2019 09/19/2018 07/25/2018 06/09/2018  PHQ - 2 Score 1 1 4 2   PHQ- 9 Score - 2 12 5      Cognitive Function MMSE - Mini Mental State Exam 01/25/2018  Orientation to time 5  Orientation to Place 5  Registration 3  Attention/ Calculation 5  Recall 1  Language- name 2 objects 2  Language- repeat 1  Language- follow 3 step command 3  Language- read & follow direction 1  Write a sentence 1  Copy design 0  Total score 27     6CIT Screen 07/27/2019 07/25/2018 07/12/2017  What Year? 0 points 0 points 0 points  What month? 0 points 0 points 0 points  What time? 0 points 0 points 0 points  Count back from 20 0 points 0 points 0 points  Months in reverse 0 points 2 points 0 points  Repeat phrase 0 points 4 points 2 points  Total Score 0 6 2    Immunization History  Administered Date(s) Administered  . Fluad Quad(high  Dose 65+) 07/06/2019  . H1N1 09/20/2008  . Influenza Split 08/11/2011, 06/22/2012  . Influenza Whole 06/30/2007, 07/08/2009, 07/09/2010  . Influenza,inj,Quad PF,6+ Mos 06/20/2013, 05/28/2014, 06/13/2015, 06/02/2016, 06/14/2017, 06/09/2018  . Pneumococcal Conjugate-13 04/03/2014  . Pneumococcal Polysaccharide-23  09/04/2003  . Td 02/20/2004  . Tdap 08/18/2011  . Zoster 05/24/2008    Qualifies for Shingles Vaccine? completed  Screening Tests Health Maintenance  Topic Date Due  . TETANUS/TDAP  08/17/2021  . INFLUENZA VACCINE  Completed  . DEXA SCAN  Completed  . PNA vac Low Risk Adult  Completed    Cancer Screenings: Lung: Low Dose CT Chest recommended if Age 65-80 years, 30 pack-year currently smoking OR have quit w/in 15years. Patient does not qualify. Breast:  Up to date on Mammogram? Yes  N/a  Up to date of Bone Density/Dexa? Yes n/a  Colorectal:  n/a  Additional Screenings:   Hepatitis C Screening:      Plan:        1. Encounter for Medicare annual wellness exam  I have personally reviewed and noted the following in the patient's chart:   . Medical and social history . Use of alcohol, tobacco or illicit drugs  . Current medications and supplements . Functional ability and status . Nutritional status . Physical activity . Advanced directives . List of other physicians . Hospitalizations, surgeries, and ER visits in previous 12 months . Vitals . Screenings to include cognitive, depression, and falls . Referrals and appointments  In addition, I have reviewed and discussed with patient certain preventive protocols, quality metrics, and best practice recommendations. A written personalized care plan for preventive services as well as general preventive health recommendations were provided to patient.     I provided 20 minutes of non-face-to-face time during this encounter.   Perlie Mayo, NP  07/27/2019

## 2019-07-27 NOTE — Patient Instructions (Signed)
Candace Cervantes , Thank you for taking time to come for your Medicare Wellness Visit. I appreciate your ongoing commitment to your health goals. Please review the following plan we discussed and let me know if I can assist you in the future.   Please continue to practice social distancing to keep you, your family, and our community safe.  If you must go out, please wear a Mask and practice good handwashing.  Screening recommendations/referrals: Colonoscopy: no longer needed Mammogram: up to date Bone Density: completed Recommended yearly ophthalmology/optometry visit for glaucoma screening and checkup Recommended yearly dental visit for hygiene and checkup  Vaccinations: Influenza vaccine: completed Pneumococcal vaccine: completed Tdap vaccine: Up to date Shingles vaccine: completed  Advanced directives: completed  Conditions/risks identified: Falls  Next appointment: 08/17/2019    Preventive Care 83 Years and Older, Female Preventive care refers to lifestyle choices and visits with your health care provider that can promote health and wellness. What does preventive care include?  A yearly physical exam. This is also called an annual well check.  Dental exams once or twice a year.  Routine eye exams. Ask your health care provider how often you should have your eyes checked.  Personal lifestyle choices, including:  Daily care of your teeth and gums.  Regular physical activity.  Eating a healthy diet.  Avoiding tobacco and drug use.  Limiting alcohol use.  Practicing safe sex.  Taking low-dose aspirin every day.  Taking vitamin and mineral supplements as recommended by your health care provider. What happens during an annual well check? The services and screenings done by your health care provider during your annual well check will depend on your age, overall health, lifestyle risk factors, and family history of disease. Counseling  Your health care provider may ask  you questions about your:  Alcohol use.  Tobacco use.  Drug use.  Emotional well-being.  Home and relationship well-being.  Sexual activity.  Eating habits.  History of falls.  Memory and ability to understand (cognition).  Work and work Statistician.  Reproductive health. Screening  You may have the following tests or measurements:  Height, weight, and BMI.  Blood pressure.  Lipid and cholesterol levels. These may be checked every 5 years, or more frequently if you are over 47 years old.  Skin check.  Lung cancer screening. You may have this screening every year starting at age 48 if you have a 30-pack-year history of smoking and currently smoke or have quit within the past 15 years.  Fecal occult blood test (FOBT) of the stool. You may have this test every year starting at age 14.  Flexible sigmoidoscopy or colonoscopy. You may have a sigmoidoscopy every 5 years or a colonoscopy every 10 years starting at age 10.  Hepatitis C blood test.  Hepatitis B blood test.  Sexually transmitted disease (STD) testing.  Diabetes screening. This is done by checking your blood sugar (glucose) after you have not eaten for a while (fasting). You may have this done every 1-3 years.  Bone density scan. This is done to screen for osteoporosis. You may have this done starting at age 30.  Mammogram. This may be done every 1-2 years. Talk to your health care provider about how often you should have regular mammograms. Talk with your health care provider about your test results, treatment options, and if necessary, the need for more tests. Vaccines  Your health care provider may recommend certain vaccines, such as:  Influenza vaccine. This is recommended every year.  Tetanus, diphtheria, and acellular pertussis (Tdap, Td) vaccine. You may need a Td booster every 10 years.  Zoster vaccine. You may need this after age 53.  Pneumococcal 13-valent conjugate (PCV13) vaccine. One dose  is recommended after age 28.  Pneumococcal polysaccharide (PPSV23) vaccine. One dose is recommended after age 72. Talk to your health care provider about which screenings and vaccines you need and how often you need them. This information is not intended to replace advice given to you by your health care provider. Make sure you discuss any questions you have with your health care provider. Document Released: 10/18/2015 Document Revised: 06/10/2016 Document Reviewed: 07/23/2015 Elsevier Interactive Patient Education  2017 [place] Prevention in the Home Falls can cause injuries. They can happen to people of all ages. There are many things you can do to make your home safe and to help prevent falls. What can I do on the outside of my home?  Regularly fix the edges of walkways and driveways and fix any cracks.  Remove anything that might make you trip as you walk through a door, such as a raised step or threshold.  Trim any bushes or trees on the path to your home.  Use bright outdoor lighting.  Clear any walking paths of anything that might make someone trip, such as rocks or tools.  Regularly check to see if handrails are loose or broken. Make sure that both sides of any steps have handrails.  Any raised decks and porches should have guardrails on the edges.  Have any leaves, snow, or ice cleared regularly.  Use sand or salt on walking paths during winter.  Clean up any spills in your garage right away. This includes oil or grease spills. What can I do in the bathroom?  Use night lights.  Install grab bars by the toilet and in the tub and shower. Do not use towel bars as grab bars.  Use non-skid mats or decals in the tub or shower.  If you need to sit down in the shower, use a plastic, non-slip stool.  Keep the floor dry. Clean up any water that spills on the floor as soon as it happens.  Remove soap buildup in the tub or shower regularly.  Attach bath mats  securely with double-sided non-slip rug tape.  Do not have throw rugs and other things on the floor that can make you trip. What can I do in the bedroom?  Use night lights.  Make sure that you have a light by your bed that is easy to reach.  Do not use any sheets or blankets that are too big for your bed. They should not hang down onto the floor.  Have a firm chair that has side arms. You can use this for support while you get dressed.  Do not have throw rugs and other things on the floor that can make you trip. What can I do in the kitchen?  Clean up any spills right away.  Avoid walking on wet floors.  Keep items that you use a lot in easy-to-reach places.  If you need to reach something above you, use a strong step stool that has a grab bar.  Keep electrical cords out of the way.  Do not use floor polish or wax that makes floors slippery. If you must use wax, use non-skid floor wax.  Do not have throw rugs and other things on the floor that can make you trip. What can I do  with my stairs?  Do not leave any items on the stairs.  Make sure that there are handrails on both sides of the stairs and use them. Fix handrails that are broken or loose. Make sure that handrails are as long as the stairways.  Check any carpeting to make sure that it is firmly attached to the stairs. Fix any carpet that is loose or worn.  Avoid having throw rugs at the top or bottom of the stairs. If you do have throw rugs, attach them to the floor with carpet tape.  Make sure that you have a light switch at the top of the stairs and the bottom of the stairs. If you do not have them, ask someone to add them for you. What else can I do to help prevent falls?  Wear shoes that:  Do not have high heels.  Have rubber bottoms.  Are comfortable and fit you well.  Are closed at the toe. Do not wear sandals.  If you use a stepladder:  Make sure that it is fully opened. Do not climb a closed  stepladder.  Make sure that both sides of the stepladder are locked into place.  Ask someone to hold it for you, if possible.  Clearly mark and make sure that you can see:  Any grab bars or handrails.  First and last steps.  Where the edge of each step is.  Use tools that help you move around (mobility aids) if they are needed. These include:  Canes.  Walkers.  Scooters.  Crutches.  Turn on the lights when you go into a dark area. Replace any light bulbs as soon as they burn out.  Set up your furniture so you have a clear path. Avoid moving your furniture around.  If any of your floors are uneven, fix them.  If there are any pets around you, be aware of where they are.  Review your medicines with your doctor. Some medicines can make you feel dizzy. This can increase your chance of falling. Ask your doctor what other things that you can do to help prevent falls. This information is not intended to replace advice given to you by your health care provider. Make sure you discuss any questions you have with your health care provider. Document Released: 07/18/2009 Document Revised: 02/27/2016 Document Reviewed: 10/26/2014 Elsevier Interactive Patient Education  2017 [employer].

## 2019-07-31 ENCOUNTER — Other Ambulatory Visit: Payer: Self-pay

## 2019-07-31 MED ORDER — OMEPRAZOLE 20 MG PO CPDR: DELAYED_RELEASE_CAPSULE | ORAL | 5 refills | Status: DC

## 2019-07-31 MED ORDER — CITALOPRAM HYDROBROMIDE 10 MG PO TABS: 10.0000 mg | ORAL_TABLET | Freq: Every day | ORAL | 5 refills | Status: DC

## 2019-07-31 MED ORDER — AMLODIPINE BESYLATE 2.5 MG PO TABS: ORAL_TABLET | ORAL | 5 refills | Status: DC

## 2019-07-31 MED ORDER — PRAVASTATIN SODIUM 40 MG PO TABS: 40.0000 mg | ORAL_TABLET | Freq: Every day | ORAL | 5 refills | Status: DC

## 2019-08-02 DIAGNOSIS — H02831 Dermatochalasis of right upper eyelid: Secondary | ICD-10-CM | POA: Diagnosis not present

## 2019-08-02 DIAGNOSIS — Z961 Presence of intraocular lens: Secondary | ICD-10-CM | POA: Diagnosis not present

## 2019-08-02 DIAGNOSIS — H02834 Dermatochalasis of left upper eyelid: Secondary | ICD-10-CM | POA: Diagnosis not present

## 2019-08-02 DIAGNOSIS — H40013 Open angle with borderline findings, low risk, bilateral: Secondary | ICD-10-CM | POA: Diagnosis not present

## 2019-08-02 DIAGNOSIS — H04123 Dry eye syndrome of bilateral lacrimal glands: Secondary | ICD-10-CM | POA: Diagnosis not present

## 2019-08-04 ENCOUNTER — Ambulatory Visit: Payer: Medicare Other

## 2019-08-07 ENCOUNTER — Ambulatory Visit: Payer: Medicare Other

## 2019-08-17 ENCOUNTER — Ambulatory Visit (INDEPENDENT_AMBULATORY_CARE_PROVIDER_SITE_OTHER): Payer: Medicare Other | Admitting: Family Medicine

## 2019-08-17 ENCOUNTER — Encounter: Payer: Self-pay | Admitting: Family Medicine

## 2019-08-17 ENCOUNTER — Other Ambulatory Visit: Payer: Self-pay

## 2019-08-17 VITALS — BP 148/72 | HR 71 | Temp 97.1°F | Resp 15 | Ht 71.0 in | Wt 159.0 lb

## 2019-08-17 DIAGNOSIS — F411 Generalized anxiety disorder: Secondary | ICD-10-CM | POA: Diagnosis not present

## 2019-08-17 DIAGNOSIS — E785 Hyperlipidemia, unspecified: Secondary | ICD-10-CM | POA: Diagnosis not present

## 2019-08-17 DIAGNOSIS — R2681 Unsteadiness on feet: Secondary | ICD-10-CM

## 2019-08-17 DIAGNOSIS — I10 Essential (primary) hypertension: Secondary | ICD-10-CM | POA: Diagnosis not present

## 2019-08-17 NOTE — Patient Instructions (Signed)
F/U in office with MD, call if you need me sooner  Excellent labs and great blood pressure  No changes in medication  PLEASE use walker with seat all the time, and be very careful not to fall  Best for 20/21  Thanks for choosing [facility], we consider it a privelige to serve you.

## 2019-08-17 NOTE — Progress Notes (Signed)
   Candace Cervantes     MRN: [id number]      DOB: 03/25/1924   HPI Candace Cervantes is here for follow up and re-evaluation of chronic medical conditions, medication management and review of any available recent lab and radiology data.  Preventive health is updated, specifically  Cancer screening and Immunization.   Questions or concerns regarding consultations or procedures which the Candace Cervantes has had in the interim are  addressed. The Candace Cervantes denies any adverse reactions to current medications since the last visit.  C/o increased anxiety and fear of being alone, no falls but seems to be using walker more   ROS Denies recent fever or chills. Denies sinus pressure, nasal congestion, ear pain or sore throat. Denies chest congestion, productive cough or wheezing. Denies chest pains, palpitations and leg swelling Denies abdominal pain, nausea, vomiting,diarrhea or constipation.   Denies dysuria, frequency, hesitancy or incontinence. C/o  joint pain, swelling and limitation in mobility. Denies headaches, seizures, numbness, or tingling. Denies depression,c/o  anxiety denies  insomnia. Denies skin break down or rash.   PE  BP (!) 160/82   Pulse 71   Temp (!) 97.1 F (36.2 C) (Temporal)   Resp 15   Ht 5\' 11"  (1.803 m)   Wt 159 lb (72.1 kg)   SpO2 98%   BMI 22.18 kg/m   Patient alert and oriented and in no cardiopulmonary distress.  HEENT: No facial asymmetry, EOMI,     Neck supple .  Chest: Clear to auscultation bilaterally.  CVS: S1, S2 no murmurs, no S3.Regular rate.  ABD: Soft non tender.   Ext: No edema  MS: Adequate ROM spine, shoulders, hips and knees.  Skin: Intact, no ulcerations or rash noted.  Psych: Good eye contact, normal affect. Memory intact not anxious or depressed appearing.  CNS: CN 2-12 intact, power,  normal throughout.no focal deficits noted.   Assessment & Plan  Essential hypertension Elevated pressure art visit, reliance in taking medication is re evaluated. No  change in current medication, will re evbal in 4 months. Increased vegetable and fruit and reduced salt and processed food  ANXIETY DISORDER, GENERALIZED Mild increase in anxiety with aging, no change in medication, increased ned for physical presence of a family member discussed and appropriate arrangements will be made as able  Hyperlipemia Hyperlipidemia:Low fat diet discussed and encouraged.   Lipid Panel  Lab Results  Component Value Date   CHOL 183 07/06/2019   HDL 78 07/06/2019   LDLCALC 89 07/06/2019   TRIG 72 07/06/2019   CHOLHDL 2.3 07/06/2019  Controlled, no change in medication      Unsteady gait Reviewed the importance of using walker for stability

## 2019-08-27 ENCOUNTER — Encounter: Payer: Self-pay | Admitting: Family Medicine

## 2019-08-27 NOTE — Assessment & Plan Note (Signed)
Hyperlipidemia:Low fat diet discussed and encouraged.   Lipid Panel  Lab Results  Component Value Date   CHOL 183 07/06/2019   HDL 78 07/06/2019   LDLCALC 89 07/06/2019   TRIG 72 07/06/2019   CHOLHDL 2.3 07/06/2019  Controlled, no change in medication

## 2019-08-27 NOTE — Assessment & Plan Note (Signed)
Reviewed the importance of using walker for stability

## 2019-08-27 NOTE — Assessment & Plan Note (Signed)
Mild increase in anxiety with aging, no change in medication, increased ned for physical presence of a family member discussed and appropriate arrangements will be made as able

## 2019-08-27 NOTE — Assessment & Plan Note (Signed)
Elevated pressure art visit, reliance in taking medication is re evaluated. No change in current medication, will re evbal in 4 months. Increased vegetable and fruit and reduced salt and processed food

## 2019-09-05 ENCOUNTER — Other Ambulatory Visit: Payer: Self-pay

## 2019-09-05 ENCOUNTER — Ambulatory Visit: Payer: Medicare Other | Admitting: *Deleted

## 2019-09-05 DIAGNOSIS — I4891 Unspecified atrial fibrillation: Secondary | ICD-10-CM

## 2019-11-23 ENCOUNTER — Telehealth: Payer: Self-pay | Admitting: Internal Medicine

## 2019-11-23 NOTE — Telephone Encounter (Signed)
Virtual Visit Pre-Appointment Phone Call  "(Name), I am calling you today to discuss your upcoming appointment. We are currently trying to limit exposure to the virus that causes COVID-19 by seeing patients at home rather than in the office."  "What is the BEST phone number to call the day of the visit?" - [phone]  1. "Do you have or have access to (through a family member/friend) a smartphone with video capability that we can use for your visit?" a. If yes - list this number in appt notes as "cell" (if different from BEST phone #) and list the appointment type as a VIDEO visit in appointment notes b. If no - list the appointment type as a PHONE visit in appointment notes  2. Confirm consent - "In the setting of the current Covid19 crisis, you are scheduled for a (phone or video) visit with your provider on (date) at (time).  Just as we do with many in-office visits, in order for you to participate in this visit, we must obtain consent.  If you'd like, I can send this to your mychart (if signed up) or email for you to review.  Otherwise, I can obtain your verbal consent now.  All virtual visits are billed to your insurance company just like a normal visit would be.  By agreeing to a virtual visit, we'd like you to understand that the technology does not allow for your provider to perform an examination, and thus may limit your provider's ability to fully assess your condition. If your provider identifies any concerns that need to be evaluated in person, we will make arrangements to do so.  Finally, though the technology is pretty good, we cannot assure that it will always work on either your or our end, and in the setting of a video visit, we may have to convert it to a phone-only visit.  In either situation, we cannot ensure that we have a secure connection.  Are you willing to proceed?" STAFF: Did the patient verbally acknowledge consent to telehealth visit? Document YES/NO here:  YES  3. Advise patient to be prepared - "Two hours prior to your appointment, go ahead and check your blood pressure, pulse, oxygen saturation, and your weight (if you have the equipment to check those) and write them all down. When your visit starts, your provider will ask you for this information. If you have an Apple Watch or Kardia device, please plan to have heart rate information ready on the day of your appointment. Please have a pen and paper handy nearby the day of the visit as well."  4. Give patient instructions for MyChart download to smartphone OR Doximity/Doxy.me as below if video visit (depending on what platform provider is using)  5. Inform patient they will receive a phone call 15 minutes prior to their appointment time (may be from unknown caller ID) so they should be prepared to answer    TELEPHONE CALL NOTE  Candace Cervantes has been deemed a candidate for a follow-up tele-health visit to limit community exposure during the Covid-19 pandemic. I spoke with the patient via phone to ensure availability of phone/video source, confirm preferred email & phone number, and discuss instructions and expectations.  I reminded Candace Cervantes to be prepared with any vital sign and/or heart rhythm information that could potentially be obtained via home monitoring, at the time of her visit. I reminded Candace Cervantes to expect a phone call prior to her visit.  Vicky T  Slaughter 11/23/2019 2:35 PM   INSTRUCTIONS FOR DOWNLOADING THE MYCHART APP TO SMARTPHONE  - The patient must first make sure to have activated MyChart and know their login information - If Apple, go to [employer] and type in MyChart in the search bar and download the app. If Android, ask patient to go to [employer] and type in [place] in the search bar and download the app. The app is free but as with any other app downloads, their phone may require them to verify saved payment information or Apple/Android password.  -  The patient will need to then log into the app with their MyChart username and password, and select [facility] as their healthcare provider to link the account. When it is time for your visit, go to the MyChart app, find appointments, and click Begin Video Visit. Be sure to Select Allow for your device to access the Microphone and Camera for your visit. You will then be connected, and your provider will be with you shortly.  **If they have any issues connecting, or need assistance please contact MyChart service desk [phone])**  **If using a computer, in order to ensure the best quality for their visit they will need to use either of the following Internet Browsers: [employer], or Google Chrome**  IF USING DOXIMITY or DOXY.ME - The patient will receive a link just prior to their visit by text.     FULL LENGTH CONSENT FOR TELE-HEALTH VISIT   I hereby voluntarily request, consent and authorize [facility] and its employed or contracted physicians, physician assistants, nurse practitioners or other licensed health care professionals (the Practitioner), to provide me with telemedicine health care services (the "Services") as deemed necessary by the treating Practitioner. I acknowledge and consent to receive the Services by the Practitioner via telemedicine. I understand that the telemedicine visit will involve communicating with the Practitioner through live audiovisual communication technology and the disclosure of certain medical information by electronic transmission. I acknowledge that I have been given the opportunity to request an in-person assessment or other available alternative prior to the telemedicine visit and am voluntarily participating in the telemedicine visit.  I understand that I have the right to withhold or withdraw my consent to the use of telemedicine in the course of my care at any time, without affecting my right to future care or treatment, and that the  Practitioner or I may terminate the telemedicine visit at any time. I understand that I have the right to inspect all information obtained and/or recorded in the course of the telemedicine visit and may receive copies of available information for a reasonable fee.  I understand that some of the potential risks of receiving the Services via telemedicine include:  Marland Kitchen Delay or interruption in medical evaluation due to technological equipment failure or disruption; . Information transmitted may not be sufficient (e.g. poor resolution of images) to allow for appropriate medical decision making by the Practitioner; and/or  . In rare instances, security protocols could fail, causing a breach of personal health information.  Furthermore, I acknowledge that it is my responsibility to provide information about my medical history, conditions and care that is complete and accurate to the best of my ability. I acknowledge that Practitioner's advice, recommendations, and/or decision may be based on factors not within their control, such as incomplete or inaccurate data provided by me or distortions of diagnostic images or specimens that may result from electronic transmissions. I understand that the practice of  medicine is not an Chief Strategy Officer and that Practitioner makes no warranties or guarantees regarding treatment outcomes. I acknowledge that I will receive a copy of this consent concurrently upon execution via email to the email address I last provided but may also request a printed copy by calling the office of [facility].    I understand that my insurance will be billed for this visit.   I have read or had this consent read to me. . I understand the contents of this consent, which adequately explains the benefits and risks of the Services being provided via telemedicine.  . I have been provided ample opportunity to ask questions regarding this consent and the Services and have had my questions answered to my  satisfaction. . I give my informed consent for the services to be provided through the use of telemedicine in my medical care  By participating in this telemedicine visit I agree to the above.

## 2019-11-24 ENCOUNTER — Other Ambulatory Visit: Payer: Self-pay

## 2019-11-24 ENCOUNTER — Telehealth (INDEPENDENT_AMBULATORY_CARE_PROVIDER_SITE_OTHER): Payer: Medicare Other | Admitting: Internal Medicine

## 2019-11-24 ENCOUNTER — Encounter: Payer: Self-pay | Admitting: Internal Medicine

## 2019-11-24 VITALS — Ht 71.0 in | Wt 150.0 lb

## 2019-11-24 DIAGNOSIS — R0789 Other chest pain: Secondary | ICD-10-CM

## 2019-11-24 DIAGNOSIS — I495 Sick sinus syndrome: Secondary | ICD-10-CM | POA: Diagnosis not present

## 2019-11-24 DIAGNOSIS — I480 Paroxysmal atrial fibrillation: Secondary | ICD-10-CM

## 2019-11-24 DIAGNOSIS — Z950 Presence of cardiac pacemaker: Secondary | ICD-10-CM

## 2019-11-24 DIAGNOSIS — I10 Essential (primary) hypertension: Secondary | ICD-10-CM

## 2019-11-24 MED ORDER — NITROGLYCERIN 0.4 MG SL SUBL: 0.4000 mg | SUBLINGUAL_TABLET | SUBLINGUAL | 3 refills | Status: DC | PRN

## 2019-11-24 NOTE — Patient Instructions (Signed)
Medication Instructions:  Your physician recommends that you continue on your current medications as directed. Please refer to the Current Medication list given to you today.   Labwork: NONE   Testing/Procedures: NONE   Follow-Up: Your physician wants you to follow-up in: August with Dr. Taylor.  You will receive a reminder letter in the mail two months in advance. If you don't receive a letter, please call our office to schedule the follow-up appointment.   Any Other Special Instructions Will Be Listed Below (If Applicable).     If you need a refill on your cardiac medications before your next appointment, please call your pharmacy.  Thank you for choosing [facility]!   

## 2019-11-24 NOTE — Progress Notes (Signed)
Electrophysiology TeleHealth Note   Due to national recommendations of social distancing due to COVID 19, an audio/video telehealth visit is felt to be most appropriate for this patient at this time.  See MyChart message from today for the patient's consent to telehealth for [facility].   Date:  11/24/2019   ID:  Candace Cervantes, DOB 03/25/1924, MRN [id number]  Location: patient's home  Provider location: [address], [place]  Evaluation Performed: Follow-up visit  PCP:  Fayrene Helper, MD  Cardiologist:  Cristopher Peru, MD  Electrophysiologist:  Dr Lovena Le  Chief Complaint:  "I'm doing good I guess."  History of Present Illness:    Candace Cervantes is a 86 y.o. female who presents via audio/video conferencing for a telehealth visit today. She is a pleasant 86 yo woman with HTN, sinus node dysfunction, s/p PPM insertion. She has noted some chest pain when she gets stressed out. She has not been taking NTG. She has not had syncope. She denies peripheral edema or sob. She has been vaccinated for Covid twice. The patient is otherwise without complaint today.  The patient denies symptoms of fevers, chills, cough, or new SOB worrisome for COVID 19.  Past Medical History:  Diagnosis Date  . Anxiety disorder   . Arthritis   . CAD (coronary artery disease) 2005   Nonobstructive 40% RCA lesion and normal ejection fraction at cath   . GERD (gastroesophageal reflux disease)   . Hyperlipidemia   . Hypertension   . Osteoporosis   . Pacemaker 2002   Permanent Placement  . Pacemaker 2007   Generator Changed   . Sinoatrial node dysfunction (HCC)   . SSS (sick sinus syndrome) ([facility])   . Zenker diverticulum    previously evaluated by Dr. Erik Obey in [id number].     Past Surgical History:  Procedure Laterality Date  . CHOLECYSTECTOMY    . COLONOSCOPY  2003   no polyps.  . CYSTECTOMY     from back of the neck  . ESOPHAGOGASTRODUODENOSCOPY  2008   Dr. Oneida Alar, difficulty  passing scope through UES, incomplete fibrous ring at GEJ, hh, multiple benign gastri polyps  . ESOPHAGOGASTRODUODENOSCOPY N/A 10/17/2013   ZENKER'S, STRICTURE: 10-12.8 MM,  NSAID GASTRITIS FG POLYPS  . ESOPHAGOGASTRODUODENOSCOPY (EGD) WITH ESOPHAGEAL DILATION N/A 10/30/2013   SLF: 1. Zenkers diverticulum with a large opening at the cricopharyngeus 2. 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 Innumerable Fundic gland polyps 4. Mild NSAID gastritis  . MALONEY DILATION N/A 10/17/2013   Procedure: MALONEY DILATION;  Surgeon: Danie Binder, MD;  Location: [facility];  Service: Endoscopy;  Laterality: N/A;  with PEDS GASTROSCOPE  . PACEMAKER INSERTION  2002  . PACEMAKER PLACEMENT  2007   replacement   . SAVORY DILATION N/A 10/17/2013   Procedure: SAVORY DILATION;  Surgeon: Danie Binder, MD;  Location: [facility] SU[facility];  Service: Endoscopy;  Laterality: N/A;  with PEDS GASTROSCOPE  . TOTAL ABDOMINAL HYSTERECTOMY  2002    Current Outpatient Medications  Medication Sig Dispense Refill  . acetaminophen (TYLENOL) 325 MG tablet Take 325 mg by mouth every 6 (six) hours as needed.    . citalopram (CELEXA) 10 MG tablet Take 1 tablet (10 mg total) by mouth daily. 30 tablet 5  . Multiple Vitamin (MULTIVITAMIN WITH MINERALS) TABS tablet Take 1 tablet by mouth daily.    . nitroGLYCERIN (NITROSTAT) 0.4 MG SL tablet Place 1 tablet (0.4 mg total) under the tongue every 5 (five) minutes  as needed for chest pain. One tablet under the tongue at onset of chest pains, repeat every five minutes as needed 25 tablet 3  . omeprazole (PRILOSEC) 20 MG capsule TAKE (1) CAPSULE BY MOUTH EVERY DAY. 30 capsule 5  . triamterene-hydrochlorothiazide (MAXZIDE-25) 37.5-25 MG tablet TAKE ONE TABLET BY MOUTH ONCE DAILY. 30 tablet 11  . amLODipine (NORVASC) 2.5 MG tablet One tab at bedtime 30 tablet 5  . pravastatin (PRAVACHOL) 40 MG tablet Take 1 tablet (40 mg total) by mouth daily. 30 tablet 5   No current facility-administered  medications for this visit.    Allergies:   Patient has no known allergies.   Social History:  The patient  reports that she has never smoked. She has never used smokeless tobacco. She reports that she does not drink alcohol or use drugs.   Family History:  The patient's family history includes Alcohol abuse in her brother; Cancer in her brother; Colon cancer in an other family member; Diabetes in her sister; Heart disease in her mother; Lung disease in her sister.   ROS:  Please see the history of present illness.   All other systems are personally reviewed and negative.    Exam:    Vital Signs:  Ht 5\' 11"  (1.803 m)   Wt 150 lb (68 kg)   BMI 20.92 kg/m    Labs/Other Tests and Data Reviewed:    Recent Labs: 07/06/2019: ALT 22; BUN 27; Creat 0.92; Hemoglobin 12.2; Platelets 186; Potassium 4.0; Sodium 141; TSH 4.25   Wt Readings from Last 3 Encounters:  11/24/19 150 lb (68 kg)  08/17/19 159 lb (72.1 kg)  07/27/19 156 lb (70.8 kg)     Other studies personally reviewed:   ASSESSMENT & PLAN:    1.  Sinus node dysfunction - she is asymptomatic, s/p PPM insertion.  2. HTN - her bp has at time been increased though I have tried not to control it too tightly due to concerns about orthostasis 3. Chest pressure - she is sedentary but she is experiencing chest pressure with emotional stress. I have refilled her NTG script today. 4. PPM- she is about 8 months from ERI. We will plan to see her in person in August 2021.   Follow-up:  8/21 Next remote: n/a  Current medicines are reviewed at length with the patient today.   The patient does not have concerns regarding her medicines.  The following changes were made today:  none  Labs/ tests ordered today include: none No orders of the defined types were placed in this encounter.    Patient Risk:  after full review of this patients clinical status, I feel that they are at moderate risk at this time.  Today, I have spent 15 minutes  with the patient with telehealth technology discussing all of the above .    Signed, Cristopher Peru, MD  11/24/2019 9:54 AM     Candace Cervantes  [place] [id number] [phone] (office) [phone] (fax)

## 2019-11-24 NOTE — Patient Outreach (Signed)
[facility]) Care Management  11/24/2019  Candace Cervantes 03/25/1924 [id number]   Medication Adherence call to Candace Cervantes [facility] Identifiers Verify spoke with patient she is past due on Pravastatin 40 mg,patient explain she has medication,patient receives every month from [name]. Candace Cervantes is showing past due under [facility].   [facility] Management Direct Dial [phone]  Fax [phone] [email or web address]

## 2019-11-28 ENCOUNTER — Telehealth: Payer: Self-pay | Admitting: Internal Medicine

## 2019-11-28 NOTE — Telephone Encounter (Signed)
Please give pt's daughter Brunetta Genera a call -- she went to pick up pt's medication at [facility] and they did not have her meds

## 2019-11-29 MED ORDER — NITROGLYCERIN 0.4 MG SL SUBL: 0.4000 mg | SUBLINGUAL_TABLET | SUBLINGUAL | 3 refills | Status: DC | PRN

## 2019-11-29 NOTE — Telephone Encounter (Signed)
NTG was refilled to [facility]

## 2019-12-05 DIAGNOSIS — H02834 Dermatochalasis of left upper eyelid: Secondary | ICD-10-CM | POA: Diagnosis not present

## 2019-12-05 DIAGNOSIS — H40013 Open angle with borderline findings, low risk, bilateral: Secondary | ICD-10-CM | POA: Diagnosis not present

## 2019-12-05 DIAGNOSIS — H02831 Dermatochalasis of right upper eyelid: Secondary | ICD-10-CM | POA: Diagnosis not present

## 2019-12-05 DIAGNOSIS — Z961 Presence of intraocular lens: Secondary | ICD-10-CM | POA: Diagnosis not present

## 2019-12-05 DIAGNOSIS — H04123 Dry eye syndrome of bilateral lacrimal glands: Secondary | ICD-10-CM | POA: Diagnosis not present

## 2019-12-08 ENCOUNTER — Encounter: Payer: Self-pay | Admitting: Family Medicine

## 2019-12-08 ENCOUNTER — Ambulatory Visit (INDEPENDENT_AMBULATORY_CARE_PROVIDER_SITE_OTHER): Payer: Medicare Other | Admitting: Family Medicine

## 2019-12-08 ENCOUNTER — Other Ambulatory Visit: Payer: Self-pay

## 2019-12-08 VITALS — BP 130/70 | HR 77 | Temp 97.5°F | Resp 15 | Ht 71.0 in | Wt 163.0 lb

## 2019-12-08 DIAGNOSIS — I10 Essential (primary) hypertension: Secondary | ICD-10-CM

## 2019-12-08 DIAGNOSIS — K219 Gastro-esophageal reflux disease without esophagitis: Secondary | ICD-10-CM | POA: Diagnosis not present

## 2019-12-08 DIAGNOSIS — M8949 Other hypertrophic osteoarthropathy, multiple sites: Secondary | ICD-10-CM

## 2019-12-08 DIAGNOSIS — E785 Hyperlipidemia, unspecified: Secondary | ICD-10-CM | POA: Diagnosis not present

## 2019-12-08 DIAGNOSIS — M159 Polyosteoarthritis, unspecified: Secondary | ICD-10-CM

## 2019-12-08 DIAGNOSIS — Z950 Presence of cardiac pacemaker: Secondary | ICD-10-CM

## 2019-12-08 DIAGNOSIS — F411 Generalized anxiety disorder: Secondary | ICD-10-CM | POA: Diagnosis not present

## 2019-12-08 DIAGNOSIS — K921 Melena: Secondary | ICD-10-CM

## 2019-12-08 DIAGNOSIS — Z1211 Encounter for screening for malignant neoplasm of colon: Secondary | ICD-10-CM

## 2019-12-08 DIAGNOSIS — M150 Primary generalized (osteo)arthritis: Secondary | ICD-10-CM

## 2019-12-08 LAB — POC HEMOCCULT BLD/STL (OFFICE/1-CARD/DIAGNOSTIC): Fecal Occult Blood, POC: NEGATIVE

## 2019-12-08 NOTE — Patient Instructions (Signed)
Annual physical exam in office early September, call if you need me sooner  Rectal exam in office today  GREAT you have both covid vaccines  Rectal exam in office today due to black stool, to check for hidden blood  Labs today, cBC, lipid., cmp and EGFR  Be careful not to fall, no stairs, use walker  No lifting over 5 pounds, you don't need to over exert your heart!  Thankful you are happy and enjoying your life.  Thanks for choosing [facility], we consider it a privelige to serve you.

## 2019-12-08 NOTE — Assessment & Plan Note (Signed)
Adequate though sub  Optimal control, no med change

## 2019-12-08 NOTE — Assessment & Plan Note (Signed)
Pt reports black stool x 6 months, rectal exam shows brown heme negative stool which is reassuring. cBC to be checked also

## 2019-12-08 NOTE — Assessment & Plan Note (Signed)
Controlled, no change in medication  

## 2019-12-08 NOTE — Assessment & Plan Note (Signed)
Hyperlipidemia:Low fat diet discussed and encouraged.   Lipid Panel  Lab Results  Component Value Date   CHOL 183 07/06/2019   HDL 78 07/06/2019   LDLCALC 89 07/06/2019   TRIG 72 07/06/2019   CHOLHDL 2.3 07/06/2019   Controlled, no change in medication Updated lab needed at/ before next visit.

## 2019-12-08 NOTE — Assessment & Plan Note (Signed)
Generalized, but has no disabling pain and is able to independently care for herself Home safety and safe ambulation discussed

## 2019-12-08 NOTE — Progress Notes (Signed)
   Candace Cervantes     MRN: [id number]      DOB: 03/25/1924   HPI Candace Cervantes is here for follow up and re-evaluation of chronic medical conditions, medication management and review of any available recent lab and radiology data.  Preventive health is updated, specifically  Cancer screening and Immunization.    The PT denies any adverse reactions to current medications since the last visit.  Using walker instead of cane now as her arthritis is worsening States she needs this. No falls,and appears to be more careful in the home , aware of her increasing frailty  Recently  , over exerted herself lifting 1 gallon water recently and need to take NTG x3  Recently insisted on going down 10 steps to her basement to look for water damage Agrees to stop all 3 6 month h/o black stool  ROS Denies recent fever or chills. Denies sinus pressure, nasal congestion, ear pain or sore throat. Denies chest congestion, productive cough or wheezing. Denies chest pains, palpitations and leg swelling Denies abdominal pain, nausea, vomiting,diarrhea or constipation.   C/o black stool x 6 months, no change in caliber or bowel pattern, apetite is good Denies dysuria, frequency, hesitancy or incontinence. Denies uncontrolled  joint pain, swelling and limitation in mobility. Denies headaches, seizures, numbness, or tingling. Denies uncontrolled depression, anxiety or insomnia.Has a boyfriend who she calls daily , and this makes her happy Denies skin break down or rash.   PE  BP (!) 150/80   Pulse 77   Temp (!) 97.5 F (36.4 C) (Temporal)   Resp 15   Ht 5\' 11"  (1.803 m)   Wt 163 lb (73.9 kg)   SpO2 97%   BMI 22.73 kg/m   Patient alert and oriented and in no cardiopulmonary distress.  HEENT: No facial asymmetry, EOMI,     Neck decreased ROM.  Chest: Clear to auscultation bilaterally.  CVS: S1, S2 no murmurs, no S3.Regular rate.  ABD: Soft non tender.  Rectal: Soft brown, heme negative stool, no  rectal or anal blood or mass Ext: No edema  MS: Decreased ROM spine, shoulders, hips and knees.  Skin: Intact, no ulcerations or rash noted.  Psych: Good eye contact, normal affect. Memory mildly impaired not anxious or depressed appearing.  CNS: CN 2-12 intact, power,  normal throughout.no focal deficits noted.   Assessment & Plan  Essential hypertension Adequate though sub  Optimal control, no med change  ANXIETY DISORDER, GENERALIZED Controlled, no change in medication   Hyperlipemia Hyperlipidemia:Low fat diet discussed and encouraged.   Lipid Panel  Lab Results  Component Value Date   CHOL 183 07/06/2019   HDL 78 07/06/2019   LDLCALC 89 07/06/2019   TRIG 72 07/06/2019   CHOLHDL 2.3 07/06/2019   Controlled, no change in medication Updated lab needed at/ before next visit.     GERD Controlled, no change in medication   Osteoarthritis Generalized, but has no disabling pain and is able to independently care for herself Home safety and safe ambulation discussed  Cardiac pacemaker in situ Followed regularly by Cardiology  Black stool Pt reports black stool x 6 months, rectal exam shows brown heme negative stool which is reassuring. cBC to be checked also

## 2019-12-08 NOTE — Assessment & Plan Note (Signed)
Followed regularly by Cardiology

## 2019-12-09 LAB — COMPLETE METABOLIC PANEL WITH GFR
AG Ratio: 1.3 (calc) (ref 1.0–2.5)
ALT: 22 U/L (ref 6–29)
AST: 28 U/L (ref 10–35)
Albumin: 4 g/dL (ref 3.6–5.1)
Alkaline phosphatase (APISO): 54 U/L (ref 37–153)
BUN/Creatinine Ratio: 25 (calc) — ABNORMAL HIGH (ref 6–22)
BUN: 25 mg/dL (ref 7–25)
CO2: 30 mmol/L (ref 20–32)
Calcium: 10.3 mg/dL (ref 8.6–10.4)
Chloride: 103 mmol/L (ref 98–110)
Creat: 1.02 mg/dL — ABNORMAL HIGH (ref 0.60–0.88)
GFR, Est African American: 55 mL/min/{1.73_m2} — ABNORMAL LOW (ref >=60)
GFR, Est Non African American: 47 mL/min/{1.73_m2} — ABNORMAL LOW (ref >=60)
Globulin: 3.2 g/dL (calc) (ref 1.9–3.7)
Glucose, Bld: 104 mg/dL — ABNORMAL HIGH (ref 65–99)
Potassium: 3.8 mmol/L (ref 3.5–5.3)
Sodium: 141 mmol/L (ref 135–146)
Total Bilirubin: 0.5 mg/dL (ref 0.2–1.2)
Total Protein: 7.2 g/dL (ref 6.1–8.1)

## 2019-12-09 LAB — CBC
HCT: 36.4 % (ref 35.0–45.0)
Hemoglobin: 12.2 g/dL (ref 11.7–15.5)
MCH: 31.3 pg (ref 27.0–33.0)
MCHC: 33.5 g/dL (ref 32.0–36.0)
MCV: 93.3 fL (ref 80.0–100.0)
MPV: 10.2 fL (ref 7.5–12.5)
Platelets: 173 10*3/uL (ref 140–400)
RBC: 3.9 10*6/uL (ref 3.80–5.10)
RDW: 12.8 % (ref 11.0–15.0)
WBC: 4.1 10*3/uL (ref 3.8–10.8)

## 2019-12-09 LAB — LIPID PANEL
Cholesterol: 170 mg/dL (ref <200)
HDL: 75 mg/dL (ref >=50)
LDL Cholesterol (Calc): 81 mg/dL (calc)
Non-HDL Cholesterol (Calc): 95 mg/dL (calc) (ref <130)
Total CHOL/HDL Ratio: 2.3 (calc) (ref <5.0)
Triglycerides: 59 mg/dL (ref <150)

## 2019-12-13 ENCOUNTER — Encounter: Payer: Self-pay | Admitting: Family Medicine

## 2019-12-14 ENCOUNTER — Ambulatory Visit: Payer: Medicare Other | Admitting: Family Medicine

## 2019-12-27 ENCOUNTER — Encounter: Payer: Self-pay | Admitting: Internal Medicine

## 2019-12-27 ENCOUNTER — Encounter: Payer: Self-pay | Admitting: *Deleted

## 2019-12-27 ENCOUNTER — Other Ambulatory Visit: Payer: Self-pay

## 2019-12-27 ENCOUNTER — Ambulatory Visit (INDEPENDENT_AMBULATORY_CARE_PROVIDER_SITE_OTHER): Payer: Medicare Other | Admitting: Internal Medicine

## 2019-12-27 VITALS — BP 132/78 | HR 74 | Temp 97.0°F | Ht 71.0 in | Wt 161.0 lb

## 2019-12-27 DIAGNOSIS — I480 Paroxysmal atrial fibrillation: Secondary | ICD-10-CM | POA: Diagnosis not present

## 2019-12-27 DIAGNOSIS — I495 Sick sinus syndrome: Secondary | ICD-10-CM

## 2019-12-27 DIAGNOSIS — Z950 Presence of cardiac pacemaker: Secondary | ICD-10-CM | POA: Diagnosis not present

## 2019-12-27 LAB — CUP PACEART INCLINIC DEVICE CHECK
Battery Impedance: 25700 Ohm
Battery Voltage: 2.56 V
Brady Statistic RA Percent Paced: 39 %
Brady Statistic RV Percent Paced: 1 % — CL
Date Time Interrogation Session: 20210324144255
Implantable Lead Implant Date: 20021126
Implantable Lead Implant Date: 20021126
Implantable Lead Location: 753859
Implantable Lead Location: 753860
Implantable Pulse Generator Implant Date: 20070926
Lead Channel Impedance Value: 431 Ohm
Lead Channel Impedance Value: 495 Ohm
Lead Channel Pacing Threshold Amplitude: 0.5 V
Lead Channel Pacing Threshold Amplitude: 1.25 V
Lead Channel Pacing Threshold Pulse Width: 0.5 ms
Lead Channel Pacing Threshold Pulse Width: 0.7 ms
Lead Channel Sensing Intrinsic Amplitude: 1.2 mV
Lead Channel Sensing Intrinsic Amplitude: 6.3 mV
Lead Channel Setting Pacing Amplitude: 2 V
Lead Channel Setting Pacing Amplitude: 2.5 V
Lead Channel Setting Pacing Pulse Width: 0.7 ms
Lead Channel Setting Sensing Sensitivity: 2 mV
Pulse Gen Model: 5816
Pulse Gen Serial Number: 1794510

## 2019-12-27 NOTE — Patient Instructions (Signed)
Medication Instructions:  Your physician recommends that you continue on your current medications as directed. Please refer to the Current Medication list given to you today.  *If you need a refill on your cardiac medications before your next appointment, please call your pharmacy*   Lab Work: Your physician recommends that you return for lab work in: April 9   If you have labs (blood work) drawn today and your tests are completely normal, you will receive your results only by: Marland Kitchen MyChart Message (if you have MyChart) OR . A paper copy in the mail If you have any lab test that is abnormal or we need to change your treatment, we will call you to review the results.   Testing/Procedures: Your physician has recommended that you have a pacemaker inserted. A pacemaker is a small device that is placed under the skin of your chest or abdomen to help control abnormal heart rhythms. This device uses electrical pulses to prompt the heart to beat at a normal rate. Pacemakers are used to treat heart rhythms that are too slow. Wire (leads) are attached to the pacemaker that goes into the chambers of you heart. This is done in the hospital and usually requires and overnight stay. Please see the instruction sheet given to you today for more information.     Follow-Up: At [facility], you and your health needs are our priority.  As part of our continuing mission to provide you with exceptional heart care, we have created designated Provider Care Teams.  These Care Teams include your primary Cardiologist (physician) and Advanced Practice Providers (APPs -  Physician Assistants and Nurse Practitioners) who all work together to provide you with the care you need, when you need it.  We recommend signing up for the patient portal called "MyChart".  Sign up information is provided on this After Visit Summary.  MyChart is used to connect with patients for Virtual Visits (Telemedicine).  Patients are able to view  lab/test results, encounter notes, upcoming appointments, etc.  Non-urgent messages can be sent to your provider as well.   To learn more about what you can do with MyChart, go to [email or web address].    Your next appointment:    To Be Determined   The format for your next appointment:   In Person  Provider:   Cristopher Peru, MD   Other Instructions Thank you for choosing [facility]!

## 2019-12-27 NOTE — Progress Notes (Signed)
HPI Candace Cervantes returns today for followup She is a pleasant elderly woman with a sinus node dysfunction s/p PPM insertion. In the interim, she has reached ERI. She feels fatigued.  No Known Allergies   Current Outpatient Medications  Medication Sig Dispense Refill  . acetaminophen (TYLENOL) 325 MG tablet Take 325 mg by mouth every 6 (six) hours as needed.    Marland Kitchen amLODipine (NORVASC) 2.5 MG tablet One tab at bedtime 30 tablet 5  . citalopram (CELEXA) 10 MG tablet Take 1 tablet (10 mg total) by mouth daily. 30 tablet 5  . famotidine (PEPCID) 10 MG tablet Take 10 mg by mouth daily.    . Multiple Vitamin (MULTIVITAMIN WITH MINERALS) TABS tablet Take 1 tablet by mouth daily.    . nitroGLYCERIN (NITROSTAT) 0.4 MG SL tablet Place 1 tablet (0.4 mg total) under the tongue every 5 (five) minutes as needed for chest pain. One tablet under the tongue at onset of chest pains, repeat every five minutes as needed 25 tablet 3  . omeprazole (PRILOSEC) 20 MG capsule TAKE (1) CAPSULE BY MOUTH EVERY DAY. 30 capsule 5  . pravastatin (PRAVACHOL) 40 MG tablet Take 1 tablet (40 mg total) by mouth daily. 30 tablet 5  . triamterene-hydrochlorothiazide (MAXZIDE-25) 37.5-25 MG tablet TAKE ONE TABLET BY MOUTH ONCE DAILY. 30 tablet 11   No current facility-administered medications for this visit.     Past Medical History:  Diagnosis Date  . Anxiety disorder   . Arthritis   . CAD (coronary artery disease) 2005   Nonobstructive 40% RCA lesion and normal ejection fraction at cath   . Chronic headache disorder 10/09/2017  . GERD (gastroesophageal reflux disease)   . Hyperlipidemia   . Hypertension   . Osteoporosis   . Pacemaker 2002   Permanent Placement  . Pacemaker 2007   Generator Changed   . Sinoatrial node dysfunction (HCC)   . SSS (sick sinus syndrome) ([place])   . Zenker diverticulum    previously evaluated by Dr. Erik Obey in [id number].     ROS:   All systems reviewed and negative except as noted in  the HPI.   Past Surgical History:  Procedure Laterality Date  . CHOLECYSTECTOMY    . COLONOSCOPY  2003   no polyps.  . CYSTECTOMY     from back of the neck  . ESOPHAGOGASTRODUODENOSCOPY  2008   Dr. Oneida Alar, difficulty passing scope through UES, incomplete fibrous ring at GEJ, hh, multiple benign gastri polyps  . ESOPHAGOGASTRODUODENOSCOPY N/A 10/17/2013   ZENKER'S, STRICTURE: 10-12.8 MM,  NSAID GASTRITIS FG POLYPS  . ESOPHAGOGASTRODUODENOSCOPY (EGD) WITH ESOPHAGEAL DILATION N/A 10/30/2013   SLF: 1. Zenkers diverticulum with a large opening at the cricopharyngeus 2. 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 Innumerable Fundic gland polyps 4. Mild NSAID gastritis  . MALONEY DILATION N/A 10/17/2013   Procedure: MALONEY DILATION;  Surgeon: Danie Binder, MD;  [facility];  Service: Endoscopy;  Laterality: N/A;  with PEDS GASTROSCOPE  . PACEMAKER INSERTION  2002  . PACEMAKER PLACEMENT  2007   replacement   . SAVORY DILATION N/A 10/17/2013   Procedure: SAVORY DILATION;  Surgeon: Danie Binder, MD;  [facility];  Service: Endoscopy;  Laterality: N/A;  with PEDS GASTROSCOPE  . TOTAL ABDOMINAL HYSTERECTOMY  2002     Family History  Problem Relation Age of Onset  . Heart disease Mother   . Lung disease Sister   . Alcohol abuse Brother   .  Cancer Brother        possible prostate  . Diabetes Sister   . Colon cancer Other        sibling, age 60     Social History   Socioeconomic History  . Marital status: Married    Spouse name: Not on file  . Number of children: 2  . Years of education: Not on file  . Highest education level: 12th grade  Occupational History  . Not on file  Tobacco Use  . Smoking status: Never Smoker  . Smokeless tobacco: Never Used  Substance and Sexual Activity  . Alcohol use: No    Alcohol/week: 0.0 standard drinks  . Drug use: No  . Sexual activity: Not Currently  Other Topics Concern  . Not on file  Social History Narrative  . Not  on file   Social Determinants of Health   Financial Resource Strain:   . Difficulty of Paying Living Expenses:   Food Insecurity:   . Worried About Charity fundraiser in the Last Year:   . Arboriculturist in the Last Year:   Transportation Needs:   . Film/video editor (Medical):   Marland Kitchen Lack of Transportation (Non-Medical):   Physical Activity:   . Days of Exercise per Week:   . Minutes of Exercise per Session:   Stress:   . Feeling of Stress :   Social Connections:   . Frequency of Communication with Friends and Family:   . Frequency of Social Gatherings with Friends and Family:   . Attends Religious Services:   . Active Member of Clubs or Organizations:   . Attends Archivist Meetings:   Marland Kitchen Marital Status:   Intimate Partner Violence:   . Fear of Current or Ex-Partner:   . Emotionally Abused:   Marland Kitchen Physically Abused:   . Sexually Abused:      BP 132/78   Pulse 74   Temp (!) 97 F (36.1 C)   Ht 5\' 11"  (1.803 m)   Wt 161 lb (73 kg)   SpO2 96%   BMI 22.45 kg/m   Physical Exam:  Well appearing NAD HEENT: Unremarkable Neck:  No JVD, no thyromegally Lymphatics:  No adenopathy Back:  No CVA tenderness Lungs:  Clear with no wheezes HEART:  Regular rate rhythm, no murmurs, no rubs, no clicks Abd:  soft, positive bowel sounds, no organomegally, no rebound, no guarding Ext:  2 plus pulses, no edema, no cyanosis, no clubbing Skin:  No rashes no nodules Neuro:  CN II through XII intact, motor grossly intact  DEVICE  Normal device function.  See PaceArt for details. ERI.  Assess/Plan: 1. Sinus node dysfunction - She is s/p PPM insertion.  2. PPM - her St. Jude DDD PM is working normally but has reached ERI.  3. Fatigue - we will check a TSH.   Mikle Bosworth.D.

## 2019-12-27 NOTE — H&P (View-Only) (Signed)
HPI [name] returns today for followup She is a pleasant elderly woman with a sinus node dysfunction s/p PPM insertion. In the interim, she has reached ERI. She feels fatigued.  No Known Allergies   Current Outpatient Medications  Medication Sig Dispense Refill  . acetaminophen (TYLENOL) 325 MG tablet Take 325 mg by mouth every 6 (six) hours as needed.    Marland Kitchen amLODipine (NORVASC) 2.5 MG tablet One tab at bedtime 30 tablet 5  . citalopram (CELEXA) 10 MG tablet Take 1 tablet (10 mg total) by mouth daily. 30 tablet 5  . famotidine (PEPCID) 10 MG tablet Take 10 mg by mouth daily.    . Multiple Vitamin (MULTIVITAMIN WITH MINERALS) TABS tablet Take 1 tablet by mouth daily.    . nitroGLYCERIN (NITROSTAT) 0.4 MG SL tablet Place 1 tablet (0.4 mg total) under the tongue every 5 (five) minutes as needed for chest pain. One tablet under the tongue at onset of chest pains, repeat every five minutes as needed 25 tablet 3  . omeprazole (PRILOSEC) 20 MG capsule TAKE (1) CAPSULE BY MOUTH EVERY DAY. 30 capsule 5  . pravastatin (PRAVACHOL) 40 MG tablet Take 1 tablet (40 mg total) by mouth daily. 30 tablet 5  . triamterene-hydrochlorothiazide (MAXZIDE-25) 37.5-25 MG tablet TAKE ONE TABLET BY MOUTH ONCE DAILY. 30 tablet 11   No current facility-administered medications for this visit.     Past Medical History:  Diagnosis Date  . Anxiety disorder   . Arthritis   . CAD (coronary artery disease) 2005   Nonobstructive 40% RCA lesion and normal ejection fraction at cath   . Chronic headache disorder 10/09/2017  . GERD (gastroesophageal reflux disease)   . Hyperlipidemia   . Hypertension   . Osteoporosis   . Pacemaker 2002   Permanent Placement  . Pacemaker 2007   Generator Changed   . Sinoatrial node dysfunction (HCC)   . SSS (sick sinus syndrome) ([place])   . Zenker diverticulum    previously evaluated by Dr. Erik Cervantes in [id number].     ROS:   All systems reviewed and negative except as noted in  the HPI.   Past Surgical History:  Procedure Laterality Date  . CHOLECYSTECTOMY    . COLONOSCOPY  2003   no polyps.  . CYSTECTOMY     from back of the neck  . ESOPHAGOGASTRODUODENOSCOPY  2008   Dr. Oneida Cervantes, difficulty passing scope through UES, incomplete fibrous ring at GEJ, hh, multiple benign gastri polyps  . ESOPHAGOGASTRODUODENOSCOPY N/A 10/17/2013   ZENKER'S, STRICTURE: 10-12.8 MM,  NSAID GASTRITIS FG POLYPS  . ESOPHAGOGASTRODUODENOSCOPY (EGD) WITH ESOPHAGEAL DILATION N/A 10/30/2013   SLF: 1. Zenkers diverticulum with a large opening at the cricopharyngeus 2. 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 Innumerable Fundic gland polyps 4. Mild NSAID gastritis  . MALONEY DILATION N/A 10/17/2013   Procedure: MALONEY DILATION;  Surgeon: Candace Binder, MD;  [facility];  Service: Endoscopy;  Laterality: N/A;  with PEDS GASTROSCOPE  . PACEMAKER INSERTION  2002  . PACEMAKER PLACEMENT  2007   replacement   . SAVORY DILATION N/A 10/17/2013   Procedure: SAVORY DILATION;  Surgeon: Candace Binder, MD;  [facility];  Service: Endoscopy;  Laterality: N/A;  with PEDS GASTROSCOPE  . TOTAL ABDOMINAL HYSTERECTOMY  2002     Family History  Problem Relation Age of Onset  . Heart disease Mother   . Lung disease Sister   . Alcohol abuse Brother   .  Cancer Brother        possible prostate  . Diabetes Sister   . Colon cancer Other        sibling, age 66     Social History   Socioeconomic History  . Marital status: Married    Spouse name: Not on file  . Number of children: 2  . Years of education: Not on file  . Highest education level: 12th grade  Occupational History  . Not on file  Tobacco Use  . Smoking status: Never Smoker  . Smokeless tobacco: Never Used  Substance and Sexual Activity  . Alcohol use: No    Alcohol/week: 0.0 standard drinks  . Drug use: No  . Sexual activity: Not Currently  Other Topics Concern  . Not on file  Social History Narrative  . Not  on file   Social Determinants of Health   Financial Resource Strain:   . Difficulty of Paying Living Expenses:   Food Insecurity:   . Worried About Charity fundraiser in the Last Year:   . Arboriculturist in the Last Year:   Transportation Needs:   . Film/video editor (Medical):   Marland Kitchen Lack of Transportation (Non-Medical):   Physical Activity:   . Days of Exercise per Week:   . Minutes of Exercise per Session:   Stress:   . Feeling of Stress :   Social Connections:   . Frequency of Communication with Friends and Family:   . Frequency of Social Gatherings with Friends and Family:   . Attends Religious Services:   . Active Member of Clubs or Organizations:   . Attends Archivist Meetings:   Marland Kitchen Marital Status:   Intimate Partner Violence:   . Fear of Current or Ex-Partner:   . Emotionally Abused:   Marland Kitchen Physically Abused:   . Sexually Abused:      BP 132/78   Pulse 74   Temp (!) 97 F (36.1 C)   Ht 5\' 11"  (1.803 m)   Wt 161 lb (73 kg)   SpO2 96%   BMI 22.45 kg/m   Physical Exam:  Well appearing NAD HEENT: Unremarkable Neck:  No JVD, no thyromegally Lymphatics:  No adenopathy Back:  No CVA tenderness Lungs:  Clear with no wheezes HEART:  Regular rate rhythm, no murmurs, no rubs, no clicks Abd:  soft, positive bowel sounds, no organomegally, no rebound, no guarding Ext:  2 plus pulses, no edema, no cyanosis, no clubbing Skin:  No rashes no nodules Neuro:  CN II through XII intact, motor grossly intact  DEVICE  Normal device function.  See PaceArt for details. ERI.  Assess/Plan: 1. Sinus node dysfunction - She is s/p PPM insertion.  2. PPM - her St. Jude DDD PM is working normally but has reached ERI.  3. Fatigue - we will check a TSH.   Mikle Bosworth.D.

## 2019-12-28 ENCOUNTER — Encounter: Payer: Medicare Other | Admitting: Internal Medicine

## 2020-01-04 ENCOUNTER — Ambulatory Visit (INDEPENDENT_AMBULATORY_CARE_PROVIDER_SITE_OTHER): Payer: Medicare Other | Admitting: Family Medicine

## 2020-01-04 ENCOUNTER — Encounter: Payer: Self-pay | Admitting: Family Medicine

## 2020-01-04 ENCOUNTER — Other Ambulatory Visit: Payer: Self-pay

## 2020-01-04 VITALS — BP 166/63 | HR 60 | Temp 97.7°F | Ht 71.0 in | Wt 161.8 lb

## 2020-01-04 DIAGNOSIS — Z950 Presence of cardiac pacemaker: Secondary | ICD-10-CM | POA: Diagnosis not present

## 2020-01-04 DIAGNOSIS — I480 Paroxysmal atrial fibrillation: Secondary | ICD-10-CM | POA: Diagnosis not present

## 2020-01-04 DIAGNOSIS — M65342 Trigger finger, left ring finger: Secondary | ICD-10-CM | POA: Insufficient documentation

## 2020-01-04 DIAGNOSIS — M19041 Primary osteoarthritis, right hand: Secondary | ICD-10-CM

## 2020-01-04 DIAGNOSIS — M19042 Primary osteoarthritis, left hand: Secondary | ICD-10-CM | POA: Diagnosis not present

## 2020-01-04 DIAGNOSIS — I495 Sick sinus syndrome: Secondary | ICD-10-CM | POA: Diagnosis not present

## 2020-01-04 NOTE — Progress Notes (Signed)
Established Patient Office Visit  Subjective:  Patient ID: Candace Cervantes, female    DOB: 09-24-1925  Age: 86 y.o. MRN: [id number]  CC:  Chief Complaint  Patient presents with  . hand and feet pain    pain in hand and feet thats been going on for a long time but seem to be getting worse and worser. Past 2 week has been bad    HPI Candace Cervantes presents for arthritis-tylenol arthritis-pain reliever bilat hand pain-worsening over the past two weeks. Pt with trigger finger noted over the past few days. Pt with long term arthritis. Pt states now with pain in the toes and feet  Past Medical History:  Diagnosis Date  . Anxiety disorder   . Arthritis   . CAD (coronary artery disease) 2005   Nonobstructive 40% RCA lesion and normal ejection fraction at cath   . Chronic headache disorder 10/09/2017  . GERD (gastroesophageal reflux disease)   . Hyperlipidemia   . Hypertension   . Osteoporosis   . Pacemaker 2002   Permanent Placement  . Pacemaker 2007   Generator Changed   . Sinoatrial node dysfunction (HCC)   . SSS (sick sinus syndrome) ([place])   . Zenker diverticulum    previously evaluated by Dr. Erik Obey in [id number].     Past Surgical History:  Procedure Laterality Date  . CHOLECYSTECTOMY    . COLONOSCOPY  2003   no polyps.  . CYSTECTOMY     from back of the neck  . ESOPHAGOGASTRODUODENOSCOPY  2008   Dr. Oneida Alar, difficulty passing scope through UES, incomplete fibrous ring at GEJ, hh, multiple benign gastri polyps  . ESOPHAGOGASTRODUODENOSCOPY N/A 10/17/2013   ZENKER'S, STRICTURE: 10-12.8 MM,  NSAID GASTRITIS FG POLYPS  . ESOPHAGOGASTRODUODENOSCOPY (EGD) WITH ESOPHAGEAL DILATION N/A 10/30/2013   SLF: 1. Zenkers diverticulum with a large opening at the cricopharyngeus 2. 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 Innumerable Fundic gland polyps 4. Mild NSAID gastritis  . MALONEY DILATION N/A 10/17/2013   Procedure: MALONEY DILATION;  Surgeon: Danie Binder, MD;  [facility];   Service: Endoscopy;  Laterality: N/A;  with PEDS GASTROSCOPE  . PACEMAKER INSERTION  2002  . PACEMAKER PLACEMENT  2007   replacement   . SAVORY DILATION N/A 10/17/2013   Procedure: SAVORY DILATION;  Surgeon: Danie Binder, MD;  [facility];  Service: Endoscopy;  Laterality: N/A;  with PEDS GASTROSCOPE  . TOTAL ABDOMINAL HYSTERECTOMY  2002    Family History  Problem Relation Age of Onset  . Heart disease Mother   . Lung disease Sister   . Alcohol abuse Brother   . Cancer Brother        possible prostate  . Diabetes Sister   . Colon cancer Other        sibling, age 68    Social History   Socioeconomic History  . Marital status: Married    Spouse name: Not on file  . Number of children: 2  . Years of education: Not on file  . Highest education level: 12th grade  Occupational History  . Not on file  Tobacco Use  . Smoking status: Never Smoker  . Smokeless tobacco: Never Used  Substance and Sexual Activity  . Alcohol use: No    Alcohol/week: 0.0 standard drinks  . Drug use: No  . Sexual activity: Not Currently  Other Topics Concern  . Not on file  Social History Narrative  . Not on file   Social  Determinants of Health   Financial Resource Strain:   . Difficulty of Paying Living Expenses:   Food Insecurity:   . Worried About Charity fundraiser in the Last Year:   . Arboriculturist in the Last Year:   Transportation Needs:   . Film/video editor (Medical):   Marland Kitchen Lack of Transportation (Non-Medical):   Physical Activity:   . Days of Exercise per Week:   . Minutes of Exercise per Session:   Stress:   . Feeling of Stress :   Social Connections:   . Frequency of Communication with Friends and Family:   . Frequency of Social Gatherings with Friends and Family:   . Attends Religious Services:   . Active Member of Clubs or Organizations:   . Attends Archivist Meetings:   Marland Kitchen Marital Status:   Intimate Partner Violence:   . Fear of Current  or Ex-Partner:   . Emotionally Abused:   Marland Kitchen Physically Abused:   . Sexually Abused:     Outpatient Medications Prior to Visit  Medication Sig Dispense Refill  . acetaminophen (TYLENOL) 325 MG tablet Take 325 mg by mouth every 6 (six) hours as needed.    Marland Kitchen amLODipine (NORVASC) 2.5 MG tablet One tab at bedtime 30 tablet 5  . citalopram (CELEXA) 10 MG tablet Take 1 tablet (10 mg total) by mouth daily. 30 tablet 5  . famotidine (PEPCID) 10 MG tablet Take 10 mg by mouth daily.    . Multiple Vitamin (MULTIVITAMIN WITH MINERALS) TABS tablet Take 1 tablet by mouth daily.    . nitroGLYCERIN (NITROSTAT) 0.4 MG SL tablet Place 1 tablet (0.4 mg total) under the tongue every 5 (five) minutes as needed for chest pain. One tablet under the tongue at onset of chest pains, repeat every five minutes as needed 25 tablet 3  . omeprazole (PRILOSEC) 20 MG capsule TAKE (1) CAPSULE BY MOUTH EVERY DAY. 30 capsule 5  . pravastatin (PRAVACHOL) 40 MG tablet Take 1 tablet (40 mg total) by mouth daily. 30 tablet 5  . triamterene-hydrochlorothiazide (MAXZIDE-25) 37.5-25 MG tablet TAKE ONE TABLET BY MOUTH ONCE DAILY. 30 tablet 11   No facility-administered medications prior to visit.    No Known Allergies  ROS Review of Systems  Musculoskeletal: Positive for arthralgias and joint swelling.      Objective:    Physical Exam  Constitutional: She appears well-developed and well-nourished.  Musculoskeletal:        General: Tenderness and deformity present.     Comments: Trigger finger left hand 4th finger Tenderness PIP joints bilat, no eryth, no swelling Left foot callous 5th toe. Plantar tenderness     BP (!) 166/63 (BP Location: Right Arm, Patient Position: Sitting, Cuff Size: Normal)   Pulse 60   Temp 97.7 F (36.5 C) (Temporal)   Ht 5\' 11"  (1.803 m)   Wt 161 lb 12.8 oz (73.4 kg)   SpO2 95%   BMI 22.57 kg/m  Wt Readings from Last 3 Encounters:  01/04/20 161 lb 12.8 oz (73.4 kg)  12/27/19 161 lb (73  kg)  12/08/19 163 lb (73.9 kg)    Lab Results  Component Value Date   TSH 4.25 07/06/2019   Lab Results  Component Value Date   WBC 4.1 12/08/2019   HGB 12.2 12/08/2019   HCT 36.4 12/08/2019   MCV 93.3 12/08/2019   PLT 173 12/08/2019   Lab Results  Component Value Date   NA 141 12/08/2019  K 3.8 12/08/2019   CO2 30 12/08/2019   GLUCOSE 104 (H) 12/08/2019   BUN 25 12/08/2019   CREATININE 1.02 (H) 12/08/2019   BILITOT 0.5 12/08/2019   ALKPHOS 62 03/03/2017   AST 28 12/08/2019   ALT 22 12/08/2019   PROT 7.2 12/08/2019   ALBUMIN 4.0 03/03/2017   CALCIUM 10.3 12/08/2019   ANIONGAP 9 02/14/2018   Lab Results  Component Value Date   CHOL 170 12/08/2019   Lab Results  Component Value Date   HDL 75 12/08/2019   Lab Results  Component Value Date   LDLCALC 81 12/08/2019   Lab Results  Component Value Date   TRIG 59 12/08/2019   Lab Results  Component Value Date   CHOLHDL 2.3 12/08/2019   Lab Results  Component Value Date   HGBA1C 5.8 (H) 07/06/2019      Assessment & Plan:   1. Trigger ring finger of left hand - Ambulatory referral to Orthopedic Surgery  2. Arthritis of both hands Tylenol -increase to twice a day Voltaren gel to specific areas Follow-up: ortho   Jolina Symonds Hannah Beat, MD

## 2020-01-04 NOTE — Patient Instructions (Addendum)
Voltaren gel-available over counter Increase tylenol arthritis to twice a day for 2 weeks -to see if we can decrease the overall pain Ortho referral for possible injection for trigger finger B12(B complex) add to vitamins   If you have lab work done today you will be contacted with your lab results within the next 2 weeks.  If you have not heard from [place] then please contact us. The fastest way to get your results is to register for My Chart.   IF you received an x-ray today, you will receive an invoice from [facility]. Please contact [facility] at [phone] with questions or concerns regarding your invoice.   IF you received labwork today, you will receive an invoice from [facility]. Please contact [facility] at [phone] with questions or concerns regarding your invoice.   Our billing staff will not be able to assist you with questions regarding bills from these companies.  You will be contacted with the lab results as soon as they are available. The fastest way to get your results is to activate your My Chart account. Instructions are located on the last page of this paperwork. If you have not heard from [place] regarding the results in 2 weeks, please contact this office.

## 2020-01-05 LAB — CBC
HCT: 37 % (ref 35.0–45.0)
Hemoglobin: 12.3 g/dL (ref 11.7–15.5)
MCH: 31.1 pg (ref 27.0–33.0)
MCHC: 33.2 g/dL (ref 32.0–36.0)
MCV: 93.4 fL (ref 80.0–100.0)
MPV: 10.3 fL (ref 7.5–12.5)
Platelets: 182 10*3/uL (ref 140–400)
RBC: 3.96 10*6/uL (ref 3.80–5.10)
RDW: 12.9 % (ref 11.0–15.0)
WBC: 4.8 10*3/uL (ref 3.8–10.8)

## 2020-01-05 LAB — BASIC METABOLIC PANEL
BUN/Creatinine Ratio: 22 (calc) (ref 6–22)
BUN: 23 mg/dL (ref 7–25)
CO2: 32 mmol/L (ref 20–32)
Calcium: 10.1 mg/dL (ref 8.6–10.4)
Chloride: 102 mmol/L (ref 98–110)
Creat: 1.04 mg/dL — ABNORMAL HIGH (ref 0.60–0.88)
Glucose, Bld: 113 mg/dL (ref 65–139)
Potassium: 3.7 mmol/L (ref 3.5–5.3)
Sodium: 140 mmol/L (ref 135–146)

## 2020-01-05 LAB — TSH: TSH: 2.15 mIU/L (ref 0.40–4.50)

## 2020-01-12 ENCOUNTER — Other Ambulatory Visit (HOSPITAL_COMMUNITY)
Admission: RE | Admit: 2020-01-12 | Discharge: 2020-01-12 | Disposition: A | Discharge status: Home / Self Care | Payer: Medicare Other | Source: Ambulatory Visit | Attending: Internal Medicine | Admitting: Internal Medicine

## 2020-01-12 ENCOUNTER — Other Ambulatory Visit: Payer: Self-pay

## 2020-01-12 DIAGNOSIS — Z01812 Encounter for preprocedural laboratory examination: Secondary | ICD-10-CM | POA: Diagnosis not present

## 2020-01-12 DIAGNOSIS — Z20822 Contact with and (suspected) exposure to covid-19: Secondary | ICD-10-CM | POA: Insufficient documentation

## 2020-01-13 LAB — SARS CORONAVIRUS 2 (TAT 6-24 HRS): SARS Coronavirus 2: NEGATIVE

## 2020-01-15 ENCOUNTER — Other Ambulatory Visit: Payer: Self-pay

## 2020-01-15 ENCOUNTER — Ambulatory Visit (HOSPITAL_COMMUNITY)
Admission: RE | Admit: 2020-01-15 | Discharge: 2020-01-15 | Disposition: A | Discharge status: Home / Self Care | Payer: Medicare Other | Attending: Internal Medicine | Admitting: Internal Medicine

## 2020-01-15 ENCOUNTER — Ambulatory Visit (HOSPITAL_COMMUNITY)
Admission: RE | Disposition: A | Discharge status: Home / Self Care | Payer: Medicare Other | Source: Home / Self Care | Attending: Internal Medicine

## 2020-01-15 DIAGNOSIS — I2510 Atherosclerotic heart disease of native coronary artery without angina pectoris: Secondary | ICD-10-CM | POA: Insufficient documentation

## 2020-01-15 DIAGNOSIS — E785 Hyperlipidemia, unspecified: Secondary | ICD-10-CM | POA: Insufficient documentation

## 2020-01-15 DIAGNOSIS — M1990 Unspecified osteoarthritis, unspecified site: Secondary | ICD-10-CM | POA: Insufficient documentation

## 2020-01-15 DIAGNOSIS — Z8249 Family history of ischemic heart disease and other diseases of the circulatory system: Secondary | ICD-10-CM | POA: Diagnosis not present

## 2020-01-15 DIAGNOSIS — Z79899 Other long term (current) drug therapy: Secondary | ICD-10-CM | POA: Insufficient documentation

## 2020-01-15 DIAGNOSIS — Z45010 Encounter for checking and testing of cardiac pacemaker pulse generator [battery]: Secondary | ICD-10-CM | POA: Diagnosis not present

## 2020-01-15 DIAGNOSIS — K219 Gastro-esophageal reflux disease without esophagitis: Secondary | ICD-10-CM | POA: Diagnosis not present

## 2020-01-15 DIAGNOSIS — I10 Essential (primary) hypertension: Secondary | ICD-10-CM | POA: Diagnosis not present

## 2020-01-15 DIAGNOSIS — I495 Sick sinus syndrome: Secondary | ICD-10-CM | POA: Diagnosis not present

## 2020-01-15 HISTORY — PX: PPM GENERATOR CHANGEOUT: EP1233

## 2020-01-15 SURGERY — PPM GENERATOR CHANGEOUT

## 2020-01-15 MED ORDER — CHLORHEXIDINE GLUCONATE 4 % EX LIQD
4.0000 "application " | Freq: Once | CUTANEOUS | Status: DC
  Filled 2020-01-15: qty 60

## 2020-01-15 MED ORDER — CEFAZOLIN SODIUM-DEXTROSE 2-4 GM/100ML-% IV SOLN
INTRAVENOUS | Status: AC
  Filled 2020-01-15: qty 100

## 2020-01-15 MED ORDER — ACETAMINOPHEN 325 MG PO TABS: 325.0000 mg | ORAL_TABLET | ORAL | Status: DC | PRN

## 2020-01-15 MED ORDER — CEFAZOLIN SODIUM-DEXTROSE 2-4 GM/100ML-% IV SOLN
2.0000 g | INTRAVENOUS | Status: AC
  Administered 2020-01-15: 2 g via INTRAVENOUS
  Filled 2020-01-15: qty 100

## 2020-01-15 MED ORDER — LIDOCAINE HCL 1 % IJ SOLN
INTRAMUSCULAR | Status: AC
  Filled 2020-01-15: qty 60

## 2020-01-15 MED ORDER — ONDANSETRON HCL 4 MG/2ML IJ SOLN: 4.0000 mg | Freq: Four times a day (QID) | INTRAMUSCULAR | Status: DC | PRN

## 2020-01-15 MED ORDER — SODIUM CHLORIDE 0.9 % IV SOLN: INTRAVENOUS | Status: DC

## 2020-01-15 MED ORDER — SODIUM CHLORIDE 0.9 % IV SOLN
80.0000 mg | INTRAVENOUS | Status: AC
  Administered 2020-01-15: 80 mg
  Filled 2020-01-15: qty 2

## 2020-01-15 MED ORDER — SODIUM CHLORIDE 0.9 % IV SOLN
INTRAVENOUS | Status: AC
  Filled 2020-01-15: qty 2

## 2020-01-15 MED ORDER — LIDOCAINE HCL (PF) 1 % IJ SOLN
INTRAMUSCULAR | Status: DC | PRN
  Administered 2020-01-15: 60 mL

## 2020-01-15 SURGICAL SUPPLY — 4 items
CABLE SURGICAL S-101-97-12 (CABLE) ×3 IMPLANT
PACEMAKER ASSURITY DR-RF (Pacemaker) ×2 IMPLANT
PAD PRO RADIOLUCENT 2001M-C (PAD) ×3 IMPLANT
TRAY PACEMAKER INSERTION (PACKS) ×3 IMPLANT

## 2020-01-15 NOTE — Discharge Instructions (Signed)

## 2020-01-15 NOTE — Interval H&P Note (Signed)
History and Physical Interval Note:  01/15/2020 12:37 PM  Candace Cervantes  has presented today for surgery, with the diagnosis of eri.  The various methods of treatment have been discussed with the patient and family. After consideration of risks, benefits and other options for treatment, the patient has consented to  Procedure(s): PPM GENERATOR CHANGEOUT (N/A) as a surgical intervention.  The patient's history has been reviewed, patient examined, no change in status, stable for surgery.  I have reviewed the patient's chart and labs.  Questions were answered to the patient's satisfaction.     Cristopher Peru

## 2020-01-15 NOTE — Progress Notes (Signed)
Discharge instructions reviewed with daughter Brunetta Genera over the phone. All questions were answered.

## 2020-01-16 MED FILL — Lidocaine HCl Local Inj 1%: INTRAMUSCULAR | Qty: 60 | Status: AC

## 2020-01-24 ENCOUNTER — Other Ambulatory Visit: Payer: Self-pay | Admitting: Family Medicine

## 2020-01-25 ENCOUNTER — Other Ambulatory Visit: Payer: Self-pay

## 2020-01-25 ENCOUNTER — Ambulatory Visit (INDEPENDENT_AMBULATORY_CARE_PROVIDER_SITE_OTHER): Payer: Medicare Other | Admitting: Student

## 2020-01-25 DIAGNOSIS — I495 Sick sinus syndrome: Secondary | ICD-10-CM | POA: Diagnosis not present

## 2020-01-25 DIAGNOSIS — Z950 Presence of cardiac pacemaker: Secondary | ICD-10-CM

## 2020-01-25 LAB — CUP PACEART INCLINIC DEVICE CHECK
Battery Remaining Longevity: 132 mo
Battery Voltage: 3.1 V
Brady Statistic RA Percent Paced: 45 %
Brady Statistic RV Percent Paced: 0.72 %
Date Time Interrogation Session: 20210422122939
Implantable Lead Implant Date: 20021126
Implantable Lead Implant Date: 20021126
Implantable Lead Location: 753859
Implantable Lead Location: 753860
Implantable Pulse Generator Implant Date: 20210412
Lead Channel Impedance Value: 412.5 Ohm
Lead Channel Impedance Value: 462.5 Ohm
Lead Channel Pacing Threshold Amplitude: 0.5 V
Lead Channel Pacing Threshold Amplitude: 0.5 V
Lead Channel Pacing Threshold Amplitude: 1 V
Lead Channel Pacing Threshold Amplitude: 1 V
Lead Channel Pacing Threshold Pulse Width: 0.5 ms
Lead Channel Pacing Threshold Pulse Width: 0.5 ms
Lead Channel Pacing Threshold Pulse Width: 0.9 ms
Lead Channel Pacing Threshold Pulse Width: 0.9 ms
Lead Channel Sensing Intrinsic Amplitude: 1.4 mV
Lead Channel Sensing Intrinsic Amplitude: 5.5 mV
Lead Channel Setting Pacing Amplitude: 2 V
Lead Channel Setting Pacing Amplitude: 2.5 V
Lead Channel Setting Pacing Pulse Width: 0.9 ms
Lead Channel Setting Sensing Sensitivity: 2 mV
Pulse Gen Model: 2272
Pulse Gen Serial Number: 3814236

## 2020-01-25 NOTE — Progress Notes (Signed)
Wound check appointment. Steri-strips removed. Wound without redness or edema. Incision edges approximated, wound well healed. Normal device function. Thresholds, sensing, and impedances consistent with implant measurements. Device programmed at appropriate safety margins for chronic leads. Histogram distribution appropriate for patient and level of activity. No mode switches or high ventricular rates noted. Patient educated about wound care, arm mobility, lifting restrictions. ROV in 3 months with Dr. Lovena Le. Next remote 04/16/2020.

## 2020-02-05 ENCOUNTER — Telehealth: Payer: Self-pay | Admitting: Internal Medicine

## 2020-02-05 NOTE — Telephone Encounter (Signed)
Spoke with daughter and pt. Pt is having chest pain in the center of her chest between her breast. Last about 10-15 min at a time. Pt states that pain comes when she is lifting something. Pt takes acid reducer helps the pain. Pt denies SOB at this time but will call back if it returns. Pt will call PCP office. Please advise.

## 2020-02-05 NOTE — Telephone Encounter (Signed)
Pt c/o of Chest Pain: STAT if CP now or developed within 24 hours  1. Are you having CP right now? N/A  2. Are you experiencing any other symptoms (ex. SOB, nausea, vomiting, sweating)? SOB, fatigue  3. How long have you been experiencing CP? 1 weeks  4. Is your CP continuous or coming and going? Coming and going  5. Have you taken Nitroglycerin? No   Pt c/o Shortness Of Breath: STAT if SOB developed within the last 24 hours or pt is noticeably SOB on the phone  1. Are you currently SOB (can you hear that pt is SOB on the phone)? N/A  2. How long have you been experiencing SOB? 1 week  3. Are you SOB when sitting or when up moving around? When up and moving around  4. Are you currently experiencing any other symptoms? No   ?

## 2020-02-06 MED ORDER — ISOSORBIDE MONONITRATE ER 30 MG PO TB24: 15.0000 mg | ORAL_TABLET | Freq: Every day | ORAL | 6 refills | Status: DC

## 2020-02-06 NOTE — Telephone Encounter (Signed)
Daughter informed pt is to start Imdur 15 mg daily. Daughter voiced understanding.

## 2020-02-06 NOTE — Addendum Note (Signed)
Addended by: Levonne Hubert on: 02/06/2020 12:45 PM   Modules accepted: Orders

## 2020-02-06 NOTE — Telephone Encounter (Signed)
Per Dr. Lovena Le --have Pt start Imdur 15 mg one tablet by mouth daily.

## 2020-02-07 DIAGNOSIS — H401134 Primary open-angle glaucoma, bilateral, indeterminate stage: Secondary | ICD-10-CM | POA: Diagnosis not present

## 2020-02-07 DIAGNOSIS — H02831 Dermatochalasis of right upper eyelid: Secondary | ICD-10-CM | POA: Diagnosis not present

## 2020-02-07 DIAGNOSIS — H02834 Dermatochalasis of left upper eyelid: Secondary | ICD-10-CM | POA: Diagnosis not present

## 2020-02-07 DIAGNOSIS — H04123 Dry eye syndrome of bilateral lacrimal glands: Secondary | ICD-10-CM | POA: Diagnosis not present

## 2020-02-07 DIAGNOSIS — Z961 Presence of intraocular lens: Secondary | ICD-10-CM | POA: Diagnosis not present

## 2020-02-09 ENCOUNTER — Encounter: Payer: Medicare Other | Admitting: Internal Medicine

## 2020-02-22 ENCOUNTER — Other Ambulatory Visit: Payer: Self-pay | Admitting: Family Medicine

## 2020-03-20 DIAGNOSIS — H401134 Primary open-angle glaucoma, bilateral, indeterminate stage: Secondary | ICD-10-CM | POA: Diagnosis not present

## 2020-03-25 ENCOUNTER — Other Ambulatory Visit: Payer: Self-pay | Admitting: Family Medicine

## 2020-04-16 ENCOUNTER — Ambulatory Visit: Payer: Medicare Other

## 2020-04-22 ENCOUNTER — Other Ambulatory Visit: Payer: Self-pay | Admitting: Internal Medicine

## 2020-04-22 ENCOUNTER — Other Ambulatory Visit: Payer: Self-pay | Admitting: Family Medicine

## 2020-04-23 ENCOUNTER — Other Ambulatory Visit: Payer: Self-pay | Admitting: Family Medicine

## 2020-05-07 ENCOUNTER — Other Ambulatory Visit: Payer: Self-pay

## 2020-05-07 ENCOUNTER — Encounter: Payer: Self-pay | Admitting: Internal Medicine

## 2020-05-07 ENCOUNTER — Ambulatory Visit (INDEPENDENT_AMBULATORY_CARE_PROVIDER_SITE_OTHER): Payer: Medicare Other | Admitting: Internal Medicine

## 2020-05-07 VITALS — BP 140/78 | HR 60 | Ht 71.0 in | Wt 163.2 lb

## 2020-05-07 DIAGNOSIS — I495 Sick sinus syndrome: Secondary | ICD-10-CM

## 2020-05-07 NOTE — Progress Notes (Signed)
HPI [name] returns today for followup. She is a pleasant 86 yo woman with a h/o HTN, brief PAF, and sinus node dysfunction s/p PPM insertion. She had a gen change out 3 months ago. She feels well. She denies chest pain or sob. She has been bothered by headache. She is pending a visit to Dr. Moshe Cipro. No Known Allergies   Current Outpatient Medications  Medication Sig Dispense Refill  . acetaminophen (TYLENOL) 325 MG tablet Take 325 mg by mouth in the morning and at bedtime.     Marland Kitchen amLODipine (NORVASC) 2.5 MG tablet TAKE (1) TABLET BY MOUTH AT BEDTIME. 30 tablet 0  . citalopram (CELEXA) 10 MG tablet TAKE ONE TABLET BY MOUTH ONCE DAILY. 30 tablet 0  . Cyanocobalamin (VITAMIN B-12 PO) Take 1 tablet by mouth daily.    . dorzolamide-timolol (COSOPT) 22.3-6.8 MG/ML ophthalmic solution 1 drop 2 (two) times daily.    . famotidine (PEPCID) 10 MG tablet Take 10 mg by mouth daily.    Marland Kitchen latanoprost (XALATAN) 0.005 % ophthalmic solution 1 drop daily.    . Multiple Vitamin (MULTIVITAMIN WITH MINERALS) TABS tablet Take 1 tablet by mouth daily.    . nitroGLYCERIN (NITROSTAT) 0.4 MG SL tablet Place 1 tablet (0.4 mg total) under the tongue every 5 (five) minutes as needed for chest pain. One tablet under the tongue at onset of chest pains, repeat every five minutes as needed 25 tablet 3  . omeprazole (PRILOSEC) 20 MG capsule TAKE ONE CAPSULE BY MOUTH ONCE DAILY. 30 capsule 0  . pravastatin (PRAVACHOL) 40 MG tablet TAKE ONE TABLET BY MOUTH ONCE DAILY. 30 tablet 0  . triamterene-hydrochlorothiazide (MAXZIDE-25) 37.5-25 MG tablet TAKE ONE TABLET BY MOUTH ONCE DAILY. 90 tablet 3   No current facility-administered medications for this visit.     Past Medical History:  Diagnosis Date  . Anxiety disorder   . Arthritis   . CAD (coronary artery disease) 2005   Nonobstructive 40% RCA lesion and normal ejection fraction at cath   . Chronic headache disorder 10/09/2017  . GERD (gastroesophageal reflux  disease)   . Hyperlipidemia   . Hypertension   . Osteoporosis   . Pacemaker 2002   Permanent Placement  . Pacemaker 2007   Generator Changed   . Sinoatrial node dysfunction (HCC)   . SSS (sick sinus syndrome) ([place])   . Zenker diverticulum    previously evaluated by Dr. Erik Obey in 2297.     ROS:   All systems reviewed and negative except as noted in the HPI.   Past Surgical History:  Procedure Laterality Date  . CHOLECYSTECTOMY    . COLONOSCOPY  2003   no polyps.  . CYSTECTOMY     from back of the neck  . ESOPHAGOGASTRODUODENOSCOPY  2008   Dr. Oneida Alar, difficulty passing scope through UES, incomplete fibrous ring at GEJ, hh, multiple benign gastri polyps  . ESOPHAGOGASTRODUODENOSCOPY N/A 10/17/2013   ZENKER'S, STRICTURE: 10-12.8 MM,  NSAID GASTRITIS FG POLYPS  . ESOPHAGOGASTRODUODENOSCOPY (EGD) WITH ESOPHAGEAL DILATION N/A 10/30/2013   SLF: 1. Zenkers diverticulum with a large opening at the cricopharyngeus 2. 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 Innumerable Fundic gland polyps 4. Mild NSAID gastritis  . MALONEY DILATION N/A 10/17/2013   Procedure: MALONEY DILATION;  Surgeon: Danie Binder, MD;  Location: [facility];  Service: Endoscopy;  Laterality: N/A;  with PEDS GASTROSCOPE  . PACEMAKER INSERTION  2002  . PACEMAKER PLACEMENT  2007   replacement   .  PPM GENERATOR CHANGEOUT N/A 01/15/2020   Procedure: PPM GENERATOR CHANGEOUT;  Surgeon: Evans Lance, MD;  Location: [place] [facility];  Service: Cardiovascular;  Laterality: N/A;  . SAVORY DILATION N/A 10/17/2013   Procedure: SAVORY DILATION;  Surgeon: Danie Binder, MD;  Location: [facility];  Service: Endoscopy;  Laterality: N/A;  with PEDS GASTROSCOPE  . TOTAL ABDOMINAL HYSTERECTOMY  2002     Family History  Problem Relation Age of Onset  . Heart disease Mother   . Lung disease Sister   . Alcohol abuse Brother   . Cancer Brother        possible prostate  . Diabetes Sister   . Colon cancer Other         sibling, age 11     Social History   Socioeconomic History  . Marital status: Married    Spouse name: Not on file  . Number of children: 2  . Years of education: Not on file  . Highest education level: 12th grade  Occupational History  . Not on file  Tobacco Use  . Smoking status: Never Smoker  . Smokeless tobacco: Never Used  Vaping Use  . Vaping Use: Never used  Substance and Sexual Activity  . Alcohol use: No    Alcohol/week: 0.0 standard drinks  . Drug use: No  . Sexual activity: Not Currently  Other Topics Concern  . Not on file  Social History Narrative  . Not on file   Social Determinants of Health   Financial Resource Strain:   . Difficulty of Paying Living Expenses:   Food Insecurity:   . Worried About Charity fundraiser in the Last Year:   . Arboriculturist in the Last Year:   Transportation Needs:   . Film/video editor (Medical):   Marland Kitchen Lack of Transportation (Non-Medical):   Physical Activity:   . Days of Exercise per Week:   . Minutes of Exercise per Session:   Stress:   . Feeling of Stress :   Social Connections:   . Frequency of Communication with Friends and Family:   . Frequency of Social Gatherings with Friends and Family:   . Attends Religious Services:   . Active Member of Clubs or Organizations:   . Attends Archivist Meetings:   Marland Kitchen Marital Status:   Intimate Partner Violence:   . Fear of Current or Ex-Partner:   . Emotionally Abused:   Marland Kitchen Physically Abused:   . Sexually Abused:      BP 140/78 (BP Location: Left Arm, Patient Position: Sitting, Cuff Size: Normal)   Pulse 60   Ht 5\' 11"  (1.803 m)   Wt 163 lb 3.2 oz (74 kg)   SpO2 99% Comment: at rest  BMI 22.76 kg/m   Physical Exam:  Well appearing NAD HEENT: Unremarkable Neck:  No JVD, no thyromegally Lymphatics:  No adenopathy Back:  No CVA tenderness Lungs:  Clear with no wheezes HEART:  Regular rate rhythm, no murmurs, no rubs, no clicks Abd:  soft, positive  bowel sounds, no organomegally, no rebound, no guarding Ext:  2 plus pulses, no edema, no cyanosis, no clubbing Skin:  No rashes no nodules Neuro:  CN II through XII intact, motor grossly intact  EKG - NSR with atrial pacing  DEVICE  Normal device function.  See PaceArt for details.   Assess/Plan: 1. Sinus node dysfunction - she is atrial pacing and asymptomatic.   2.  PPM - her [place] DDD PM is working normally.  3. HA - she is pending a visit to Dr. Moshe Cipro. Sounds like she might have temporal arteritis. 4. PAF - she has had an hour of atrial fib. We discussed systemic anti-coagulation. We will hold off on this for now as the risk benefit does not warrant. However, if she has more atrial fib, we will reconsider Eliquis.  Mikle Bosworth.D.

## 2020-05-07 NOTE — Patient Instructions (Signed)
Medication Instructions:  Your physician recommends that you continue on your current medications as directed. Please refer to the Current Medication list given to you today.  *If you need a refill on your cardiac medications before your next appointment, please call your pharmacy*   Lab Work: NONE   If you have labs (blood work) drawn today and your tests are completely normal, you will receive your results only by: . MyChart Message (if you have MyChart) OR . A paper copy in the mail If you have any lab test that is abnormal or we need to change your treatment, we will call you to review the results.   Testing/Procedures: NONE    Follow-Up: At [facility], you and your health needs are our priority.  As part of our continuing mission to provide you with exceptional heart care, we have created designated Provider Care Teams.  These Care Teams include your primary Cardiologist (physician) and Advanced Practice Providers (APPs -  Physician Assistants and Nurse Practitioners) who all work together to provide you with the care you need, when you need it.  We recommend signing up for the patient portal called "MyChart".  Sign up information is provided on this After Visit Summary.  MyChart is used to connect with patients for Virtual Visits (Telemedicine).  Patients are able to view lab/test results, encounter notes, upcoming appointments, etc.  Non-urgent messages can be sent to your provider as well.   To learn more about what you can do with MyChart, go to [email or web address].    Your next appointment:   1 year(s)  The format for your next appointment:   In Person  Provider:   Gregg Taylor, MD   Other Instructions Thank you for choosing [facility]!    

## 2020-05-09 ENCOUNTER — Encounter: Payer: Self-pay | Admitting: Family Medicine

## 2020-05-09 ENCOUNTER — Ambulatory Visit (INDEPENDENT_AMBULATORY_CARE_PROVIDER_SITE_OTHER): Payer: Medicare Other | Admitting: Family Medicine

## 2020-05-09 ENCOUNTER — Other Ambulatory Visit: Payer: Self-pay

## 2020-05-09 VITALS — BP 180/90 | HR 60 | Resp 16 | Ht 71.0 in | Wt 159.1 lb

## 2020-05-09 DIAGNOSIS — Z0000 Encounter for general adult medical examination without abnormal findings: Secondary | ICD-10-CM | POA: Diagnosis not present

## 2020-05-09 DIAGNOSIS — E559 Vitamin D deficiency, unspecified: Secondary | ICD-10-CM | POA: Diagnosis not present

## 2020-05-09 DIAGNOSIS — D539 Nutritional anemia, unspecified: Secondary | ICD-10-CM

## 2020-05-09 DIAGNOSIS — E785 Hyperlipidemia, unspecified: Secondary | ICD-10-CM | POA: Diagnosis not present

## 2020-05-09 DIAGNOSIS — I10 Essential (primary) hypertension: Secondary | ICD-10-CM

## 2020-05-09 DIAGNOSIS — R7302 Impaired glucose tolerance (oral): Secondary | ICD-10-CM

## 2020-05-09 LAB — POCT GLYCOSYLATED HEMOGLOBIN (HGB A1C)
HbA1c POC (<> result, manual entry): 5.8 % (ref 4.0–5.6)
HbA1c, POC (controlled diabetic range): 5.8 % (ref 0.0–7.0)
HbA1c, POC (prediabetic range): 5.8 % (ref 5.7–6.4)
Hemoglobin A1C: 5.8 % — AB (ref 4.0–5.6)

## 2020-05-09 MED ORDER — HYDROCHLOROTHIAZIDE 12.5 MG PO CAPS: 12.5000 mg | ORAL_CAPSULE | Freq: Every day | ORAL | 3 refills | Status: DC

## 2020-05-09 MED ORDER — AMLODIPINE BESYLATE 5 MG PO TABS
5.0000 mg | ORAL_TABLET | Freq: Every day | ORAL | 3 refills | Status: DC
Start: 2020-05-09 — End: 2020-08-20

## 2020-05-09 NOTE — Progress Notes (Signed)
Candace Cervantes     MRN: [id number]      DOB: Jul 08, 1925  HPI: Patient is in for annual physical exam. Feels tired, not as physically able as she would like to be .   PE BP (!) 180/90   Pulse 60   Resp 16   Ht 5\' 11"  (1.803 m)   Wt 159 lb 1.9 oz (72.2 kg)   SpO2 98%   BMI 22.19 kg/m  ]  Pleasant  female, alert Afebrile. HEENT No facial trauma or asymetry. Sinuses non tender.  Extra occullar muscles intact.. External ears normal, . Neck:decreased ROM, no adenopathy,JVD or thyromegaly.No bruits.  Chest: Clear to ascultation bilaterally.No crackles or wheezes. Non tender to palpation  Breast: Asymptomatic, normal mammogram in 10/2018  Cardiovascular system; Heart sounds normal,  S1 and  S2 ,no S3.  No murmur, or thrill. Apical beat not displaced Peripheral pulses normal.  Abdomen: Soft, non tender,. No guarding, tenderness or rebound.      Musculoskeletal exam: Decreased  ROM of spine, hips , shoulders and knees.  deformity ,swelling oand crepitus noted. No muscle wasting or atrophy.   Neurologic: Cranial nerves 2 to 12 intact.decreased hearing Power, tone ,sensation  normal throughout. No disturbance in gait. No tremor.  Skin: Intact, no ulceration, erythema , scaling or rash noted. Pigmentation normal throughout  Psych; Normal mood and affect.   Assessment & Plan:  Annual physical exam Annual exam as documented. Counseling done  re healthy lifestyle involving commitment to 150 minutes exercise per week, heart healthy diet, and attaining healthy weight.The importance of adequate sleep also discussed. Regular seat belt use and home safety, is also discussed. Changes in health habits are decided on by the patient with goals and time frames  set for achieving them. Immunization and cancer screening needs are specifically addressed at this visit.   IGT (impaired glucose tolerance) Patient educated about the importance of limiting  Carbohydrate  intake , the need to commit to daily physical activity for a minimum of 30 minutes , and to commit weight loss. The fact that changes in all these areas will reduce or eliminate all together the development of diabetes is stressed.   Diabetic Labs Latest Ref Rng & Units 05/09/2020 05/09/2020 05/09/2020 05/09/2020 01/04/2020  HbA1c 4.0 - 5.6 % 5.8 5.8 5.8 5.8(A) -  Chol 100 - 199 mg/dL 182 - - - -  HDL >39 mg/dL 88 - - - -  Calc LDL 0 - 99 mg/dL 83 - - - -  Triglycerides 0 - 149 mg/dL 58 - - - -  Creatinine 0.57 - 1.00 mg/dL 0.98 - - - 1.04(H)   BP/Weight 05/09/2020 05/07/2020 01/15/2020 01/04/2020 12/27/2019 12/08/2019 7/89/3810  Systolic BP 175 102 585 277 824 235 -  Diastolic BP 90 78 67 63 78 70 -  Wt. (Lbs) 159.12 163.2 162 161.8 161 163 150  BMI 22.19 22.76 22.59 22.57 22.45 22.73 20.92   No flowsheet data found.    Essential hypertension Uncontrolled, medication dose change and re eval in 3 weeks DASH diet and commitment to daily physical activity for a minimum of 30 minutes discussed and encouraged, as a part of hypertension management. The importance of attaining a healthy weight is also discussed.  BP/Weight 05/09/2020 05/07/2020 01/15/2020 01/04/2020 12/27/2019 12/08/2019 3/61/4431  Systolic BP 540 086 761 950 932 671 -  Diastolic BP 90 78 67 63 78 70 -  Wt. (Lbs) 159.12 163.2 162 161.8 161 163 150  BMI 22.19  22.76 22.59 22.57 22.45 22.73 20.92        

## 2020-05-09 NOTE — Assessment & Plan Note (Signed)

## 2020-05-09 NOTE — Patient Instructions (Addendum)
Follow-up in office with MD to reevaluate blood pressure and PHQ 9  in the next 3 weeks.  Call if you need me sooner.  Your blood pressure is very high.  There is a change in medication.  Stop triamterene starting today.  New dose of amlodipine is 5 mg tablet +2.5 mg tablet which is 7.5 mg once daily.    New additional blood pressure medicine is hydrochlorothiazide 12.5 mg 1 daily  GlycoHb in office today  B12, lipid, cmp and eGFR, Vit D level today  Please be careful not to fall  Increase the amount of food you eat by eating more often, small amounts of " healthy food" as you are doing  Tylenol arthritis is the recommended tablet for arthritis pain  And headache  Our condolence on the recent loss of your brother  Thanks for choosing [facility], we consider it a privelige to serve you.

## 2020-05-10 LAB — CMP14+EGFR
ALT: 23 IU/L (ref 0–32)
AST: 25 IU/L (ref 0–40)
Albumin/Globulin Ratio: 1.3 (ref 1.2–2.2)
Albumin: 4.4 g/dL (ref 3.5–4.6)
Alkaline Phosphatase: 68 IU/L (ref 48–121)
BUN/Creatinine Ratio: 26 (ref 12–28)
BUN: 25 mg/dL (ref 10–36)
Bilirubin Total: 0.3 mg/dL (ref 0.0–1.2)
CO2: 26 mmol/L (ref 20–29)
Calcium: 10.1 mg/dL (ref 8.7–10.3)
Chloride: 101 mmol/L (ref 96–106)
Creatinine, Ser: 0.98 mg/dL (ref 0.57–1.00)
GFR calc Af Amer: 58 mL/min/{1.73_m2} — ABNORMAL LOW (ref >59)
GFR calc non Af Amer: 50 mL/min/{1.73_m2} — ABNORMAL LOW (ref >59)
Globulin, Total: 3.4 g/dL (ref 1.5–4.5)
Glucose: 108 mg/dL — ABNORMAL HIGH (ref 65–99)
Potassium: 3.8 mmol/L (ref 3.5–5.2)
Sodium: 143 mmol/L (ref 134–144)
Total Protein: 7.8 g/dL (ref 6.0–8.5)

## 2020-05-10 LAB — LIPID PANEL
Chol/HDL Ratio: 2.1 ratio (ref 0.0–4.4)
Cholesterol, Total: 182 mg/dL (ref 100–199)
HDL: 88 mg/dL (ref >39)
LDL Chol Calc (NIH): 83 mg/dL (ref 0–99)
Triglycerides: 58 mg/dL (ref 0–149)
VLDL Cholesterol Cal: 11 mg/dL (ref 5–40)

## 2020-05-10 LAB — VITAMIN B12: Vitamin B-12: 1380 pg/mL — ABNORMAL HIGH (ref 232–1245)

## 2020-05-10 LAB — VITAMIN D 25 HYDROXY (VIT D DEFICIENCY, FRACTURES): Vit D, 25-Hydroxy: 40.3 ng/mL (ref 30.0–100.0)

## 2020-05-11 ENCOUNTER — Encounter: Payer: Self-pay | Admitting: Family Medicine

## 2020-05-11 NOTE — Assessment & Plan Note (Signed)
Uncontrolled, medication dose change and re eval in 3 weeks DASH diet and commitment to daily physical activity for a minimum of 30 minutes discussed and encouraged, as a part of hypertension management. The importance of attaining a healthy weight is also discussed.  BP/Weight 05/09/2020 05/07/2020 01/15/2020 01/04/2020 12/27/2019 12/08/2019 6/77/3736  Systolic BP 681 594 707 615 183 437 -  Diastolic BP 90 78 67 63 78 70 -  Wt. (Lbs) 159.12 163.2 162 161.8 161 163 150  BMI 22.19 22.76 22.59 22.57 22.45 22.73 20.92

## 2020-05-11 NOTE — Assessment & Plan Note (Signed)
Patient educated about the importance of limiting  Carbohydrate intake , the need to commit to daily physical activity for a minimum of 30 minutes , and to commit weight loss. The fact that changes in all these areas will reduce or eliminate all together the development of diabetes is stressed.   Diabetic Labs Latest Ref Rng & Units 05/09/2020 05/09/2020 05/09/2020 05/09/2020 01/04/2020  HbA1c 4.0 - 5.6 % 5.8 5.8 5.8 5.8(A) -  Chol 100 - 199 mg/dL 182 - - - -  HDL >39 mg/dL 88 - - - -  Calc LDL 0 - 99 mg/dL 83 - - - -  Triglycerides 0 - 149 mg/dL 58 - - - -  Creatinine 0.57 - 1.00 mg/dL 0.98 - - - 1.04(H)   BP/Weight 05/09/2020 05/07/2020 01/15/2020 01/04/2020 12/27/2019 12/08/2019 11/30/7503  Systolic BP 183 358 251 898 421 031 -  Diastolic BP 90 78 67 63 78 70 -  Wt. (Lbs) 159.12 163.2 162 161.8 161 163 150  BMI 22.19 22.76 22.59 22.57 22.45 22.73 20.92   No flowsheet data found.

## 2020-05-13 ENCOUNTER — Telehealth: Payer: Self-pay

## 2020-05-13 NOTE — Telephone Encounter (Signed)
Clarified with CA that patient is on both doses and gave verbal ok to refill 2.5mg  dose. Spoke with daughter and let her know patient is supposed to take both doses and CA is currently refilling 2.5mg  dose.

## 2020-05-13 NOTE — Telephone Encounter (Signed)
Candace Cervantes is calling stating that [name] as there is a discrepancy with the Amlodipine 5mg  vs 7.5  Please call Engineer, water and [employer]

## 2020-05-20 ENCOUNTER — Other Ambulatory Visit: Payer: Self-pay | Admitting: Family Medicine

## 2020-06-12 ENCOUNTER — Encounter: Payer: Self-pay | Admitting: Family Medicine

## 2020-06-12 ENCOUNTER — Ambulatory Visit (INDEPENDENT_AMBULATORY_CARE_PROVIDER_SITE_OTHER): Payer: Medicare Other | Admitting: Family Medicine

## 2020-06-12 ENCOUNTER — Other Ambulatory Visit: Payer: Self-pay

## 2020-06-12 VITALS — BP 138/82 | HR 62 | Temp 97.6°F | Resp 18 | Ht 71.0 in | Wt 165.4 lb

## 2020-06-12 DIAGNOSIS — I10 Essential (primary) hypertension: Secondary | ICD-10-CM | POA: Diagnosis not present

## 2020-06-12 DIAGNOSIS — Z23 Encounter for immunization: Secondary | ICD-10-CM | POA: Diagnosis not present

## 2020-06-12 NOTE — Progress Notes (Signed)
Subjective:  Patient ID: Candace Cervantes, female    DOB: [birth year]  Age: 86 y.o. MRN: [id number]  CC:  Chief Complaint  Patient presents with  . Follow-up    follow up bp is doing better today pt just feels weak daughter stated she hadnt had breakfast or taking her meds       HPI  HPI  Candace Cervantes is a 86 year old female patient of Dr. Griffin Dakin.  She presents today for follow-up on her blood pressure.  Dr. Moshe Cipro saw her a month ago.  Medication dose was changed.  And she was presenting today to follow-up 3 weeks from her last appointment.  Blood pressure reading on August 5 was 180/90.  Previous readings have been pretty consistently 130 over the 60s to 70s.  Daughter reports that she feels weak because she has not had her breakfast or take her meds this morning. She has a baseline of weakness, nothing has change since med change.  She denies having any chest pain, palpitations, headaches, dizziness, vision changes ,cough, shortness of breath, fever or chills.  Has not had any falls or issues with walking.  Today patient denies signs and symptoms of COVID 19 infection including fever, chills, cough, shortness of breath, and headache. Past Medical, Surgical, Social History, Allergies, and Medications have been Reviewed.   Past Medical History:  Diagnosis Date  . Anxiety disorder   . Arthritis   . CAD (coronary artery disease) 2005   Nonobstructive 40% RCA lesion and normal ejection fraction at cath   . Chronic headache disorder 10/09/2017  . GERD (gastroesophageal reflux disease)   . Hyperlipidemia   . Hypertension   . Osteoporosis   . Pacemaker 2002   Permanent Placement  . Pacemaker 2007   Generator Changed   . Sinoatrial node dysfunction (HCC)   . SSS (sick sinus syndrome) ([place])   . Zenker diverticulum    previously evaluated by Dr. Erik Obey in [id number].     Current Meds  Medication Sig  . acetaminophen (TYLENOL) 325 MG tablet Take 325 mg by mouth in the morning and  at bedtime.   Marland Kitchen amLODipine (NORVASC) 5 MG tablet Take 1 tablet (5 mg total) by mouth daily.  . citalopram (CELEXA) 10 MG tablet TAKE ONE TABLET BY MOUTH ONCE DAILY.  Marland Kitchen Cyanocobalamin (VITAMIN B-12 PO) Take 1 tablet by mouth daily.  . dorzolamide-timolol (COSOPT) 22.3-6.8 MG/ML ophthalmic solution 1 drop 2 (two) times daily.  . famotidine (PEPCID) 10 MG tablet Take 10 mg by mouth daily.  . hydrochlorothiazide (MICROZIDE) 12.5 MG capsule Take 1 capsule (12.5 mg total) by mouth daily.  Marland Kitchen latanoprost (XALATAN) 0.005 % ophthalmic solution 1 drop daily.  . Multiple Vitamin (MULTIVITAMIN WITH MINERALS) TABS tablet Take 1 tablet by mouth daily.  . nitroGLYCERIN (NITROSTAT) 0.4 MG SL tablet Place 1 tablet (0.4 mg total) under the tongue every 5 (five) minutes as needed for chest pain. One tablet under the tongue at onset of chest pains, repeat every five minutes as needed  . omeprazole (PRILOSEC) 20 MG capsule TAKE ONE CAPSULE BY MOUTH ONCE DAILY.  . pravastatin (PRAVACHOL) 40 MG tablet TAKE ONE TABLET BY MOUTH ONCE DAILY.  . [DISCONTINUED] amLODipine (NORVASC) 2.5 MG tablet TAKE (1) TABLET BY MOUTH AT BEDTIME.    ROS:  Review of Systems  Constitutional: Negative.   HENT: Negative.   Eyes: Negative.   Respiratory: Negative.   Cardiovascular: Negative.   Gastrointestinal: Negative.   Genitourinary: Negative.   Musculoskeletal:  Negative.   Skin: Negative.   Neurological: Negative.   Endo/Heme/Allergies: Negative.   Psychiatric/Behavioral: Negative.      Objective:   Today's Vitals: BP 138/82 (BP Location: Right Arm, Patient Position: Sitting, Cuff Size: Normal)   Pulse 62   Temp 97.6 F (36.4 C) (Temporal)   Resp 18   Ht 5\' 11"  (1.803 m)   Wt 165 lb 6.4 oz (75 kg)   SpO2 97%   BMI 23.07 kg/m  Vitals with BMI 06/12/2020 05/09/2020 05/09/2020  Height 5\' 11"  - 5\' 11"   Weight 165 lbs 6 oz - 159 lbs 2 oz  BMI 85.02 - 77.4  Systolic 128 786 767  Diastolic 82 90 81  Pulse 62 - 60      Physical Exam Vitals and nursing note reviewed.  Constitutional:      Appearance: Normal appearance. She is well-developed, well-groomed and normal weight.  HENT:     Head: Normocephalic and atraumatic.     Right Ear: External ear normal.     Left Ear: External ear normal.     Mouth/Throat:     Comments: Mask in place Eyes:     General:        Right eye: No discharge.        Left eye: No discharge.     Conjunctiva/sclera: Conjunctivae normal.  Cardiovascular:     Rate and Rhythm: Normal rate and regular rhythm.     Pulses: Normal pulses.     Heart sounds: Normal heart sounds.  Pulmonary:     Effort: Pulmonary effort is normal.     Breath sounds: Normal breath sounds.  Musculoskeletal:        General: Normal range of motion.     Cervical back: Normal range of motion and neck supple.  Skin:    General: Skin is warm.  Neurological:     General: No focal deficit present.     Mental Status: She is alert and oriented to person, place, and time.  Psychiatric:        Attention and Perception: Attention normal.        Mood and Affect: Mood normal.        Speech: Speech normal.        Behavior: Behavior normal. Behavior is cooperative.        Thought Content: Thought content normal.        Cognition and Memory: Cognition normal.        Judgment: Judgment normal.    Assessment   1. Essential hypertension   2. Need for immunization against influenza     Tests ordered Orders Placed This Encounter  Procedures  . Flu Vaccine QUAD High Dose(Fluad)     Plan: Please see assessment and plan per problem list above.   No orders of the defined types were placed in this encounter.   Patient to follow-up in 3 months .  Perlie Mayo, NP

## 2020-06-12 NOTE — Patient Instructions (Addendum)
I appreciate the opportunity to provide you with care for your health and wellness. Today we discussed: BP  Follow up: 3 months   No labs or referrals today  Continue current medications as ordered.  Please continue to practice social distancing to keep you, your family, and our community safe.  If you must go out, please wear a mask and practice good handwashing.  It was a pleasure to see you and I look forward to continuing to work together on your health and well-being. Please do not hesitate to call the office if you need care or have questions about your care.  Have a wonderful day and week. With Gratitude, Cherly Beach, DNP, AGNP-BC

## 2020-06-12 NOTE — Assessment & Plan Note (Signed)

## 2020-06-12 NOTE — Assessment & Plan Note (Signed)
Overall doing well with medication changes.  We will follow-up in 3 months.  Encouraged to continue on medications as prescribed.  In addition to DASH diet and walking around safely as tolerated.

## 2020-06-17 ENCOUNTER — Encounter: Payer: Medicare Other | Admitting: Family Medicine

## 2020-06-19 ENCOUNTER — Other Ambulatory Visit: Payer: Self-pay | Admitting: Family Medicine

## 2020-07-30 ENCOUNTER — Telehealth (INDEPENDENT_AMBULATORY_CARE_PROVIDER_SITE_OTHER): Payer: Medicare Other

## 2020-07-30 ENCOUNTER — Other Ambulatory Visit: Payer: Self-pay

## 2020-07-30 VITALS — BP 138/82 | HR 62 | Temp 97.6°F | Resp 18 | Ht 71.0 in | Wt 165.0 lb

## 2020-07-30 DIAGNOSIS — Z0000 Encounter for general adult medical examination without abnormal findings: Secondary | ICD-10-CM | POA: Diagnosis not present

## 2020-07-30 NOTE — Patient Instructions (Signed)
Candace Cervantes , Thank you for taking time to come for your Medicare Wellness Visit. I appreciate your ongoing commitment to your health goals. Please review the following plan we discussed and let me know if I can assist you in the future.   Screening recommendations/referrals: Colonoscopy: no longer required  Mammogram: no longer required  Bone Density: COMPLETED  Recommended yearly ophthalmology/optometry visit for glaucoma screening and checkup Recommended yearly dental visit for hygiene and checkup  Vaccinations: Influenza vaccine: COMPLETED  Pneumococcal vaccine: COMPLETED  Tdap vaccine: DUE 08/17/2021  Shingles vaccine: EDUCATION PROVIDED.     Advanced directives: N/A   Conditions/risks identified: AGING MEMORY CHANGES, RELATIVE TO PT AGE.   Next appointment: 09/11/2020 @ 10:40AM WITH DR. Moshe Cipro    Preventive Care 65 Years and Older, Female Preventive care refers to lifestyle choices and visits with your health care provider that can promote health and wellness. What does preventive care include?  A yearly physical exam. This is also called an annual well check.  Dental exams once or twice a year.  Routine eye exams. Ask your health care provider how often you should have your eyes checked.  Personal lifestyle choices, including:  Daily care of your teeth and gums.  Regular physical activity.  Eating a healthy diet.  Avoiding tobacco and drug use.  Limiting alcohol use.  Practicing safe sex.  Taking low-dose aspirin every day.  Taking vitamin and mineral supplements as recommended by your health care provider. What happens during an annual well check? The services and screenings done by your health care provider during your annual well check will depend on your age, overall health, lifestyle risk factors, and family history of disease. Counseling  Your health care provider may ask you questions about your:  Alcohol use.  Tobacco use.  Drug  use.  Emotional well-being.  Home and relationship well-being.  Sexual activity.  Eating habits.  History of falls.  Memory and ability to understand (cognition).  Work and work Statistician.  Reproductive health. Screening  You may have the following tests or measurements:  Height, weight, and BMI.  Blood pressure.  Lipid and cholesterol levels. These may be checked every 5 years, or more frequently if you are over 33 years old.  Skin check.  Lung cancer screening. You may have this screening every year starting at age 26 if you have a 30-pack-year history of smoking and currently smoke or have quit within the past 15 years.  Fecal occult blood test (FOBT) of the stool. You may have this test every year starting at age 36.  Flexible sigmoidoscopy or colonoscopy. You may have a sigmoidoscopy every 5 years or a colonoscopy every 10 years starting at age 60.  Hepatitis C blood test.  Hepatitis B blood test.  Sexually transmitted disease (STD) testing.  Diabetes screening. This is done by checking your blood sugar (glucose) after you have not eaten for a while (fasting). You may have this done every 1-3 years.  Bone density scan. This is done to screen for osteoporosis. You may have this done starting at age 43.  Mammogram. This may be done every 1-2 years. Talk to your health care provider about how often you should have regular mammograms. Talk with your health care provider about your test results, treatment options, and if necessary, the need for more tests. Vaccines  Your health care provider may recommend certain vaccines, such as:  Influenza vaccine. This is recommended every year.  Tetanus, diphtheria, and acellular pertussis (Tdap, Td)  vaccine. You may need a Td booster every 10 years.  Zoster vaccine. You may need this after age 35.  Pneumococcal 13-valent conjugate (PCV13) vaccine. One dose is recommended after age 32.  Pneumococcal polysaccharide  (PPSV23) vaccine. One dose is recommended after age 60. Talk to your health care provider about which screenings and vaccines you need and how often you need them. This information is not intended to replace advice given to you by your health care provider. Make sure you discuss any questions you have with your health care provider. Document Released: 10/18/2015 Document Revised: 06/10/2016 Document Reviewed: 07/23/2015 Elsevier Interactive Patient Education  2017 [employer] Prevention in the Home Falls can cause injuries. They can happen to people of all ages. There are many things you can do to make your home safe and to help prevent falls. What can I do on the outside of my home?  Regularly fix the edges of walkways and driveways and fix any cracks.  Remove anything that might make you trip as you walk through a door, such as a raised step or threshold.  Trim any bushes or trees on the path to your home.  Use bright outdoor lighting.  Clear any walking paths of anything that might make someone trip, such as rocks or tools.  Regularly check to see if handrails are loose or broken. Make sure that both sides of any steps have handrails.  Any raised decks and porches should have guardrails on the edges.  Have any leaves, snow, or ice cleared regularly.  Use sand or salt on walking paths during winter.  Clean up any spills in your garage right away. This includes oil or grease spills. What can I do in the bathroom?  Use night lights.  Install grab bars by the toilet and in the tub and shower. Do not use towel bars as grab bars.  Use non-skid mats or decals in the tub or shower.  If you need to sit down in the shower, use a plastic, non-slip stool.  Keep the floor dry. Clean up any water that spills on the floor as soon as it happens.  Remove soap buildup in the tub or shower regularly.  Attach bath mats securely with double-sided non-slip rug tape.  Do not have  throw rugs and other things on the floor that can make you trip. What can I do in the bedroom?  Use night lights.  Make sure that you have a light by your bed that is easy to reach.  Do not use any sheets or blankets that are too big for your bed. They should not hang down onto the floor.  Have a firm chair that has side arms. You can use this for support while you get dressed.  Do not have throw rugs and other things on the floor that can make you trip. What can I do in the kitchen?  Clean up any spills right away.  Avoid walking on wet floors.  Keep items that you use a lot in easy-to-reach places.  If you need to reach something above you, use a strong step stool that has a grab bar.  Keep electrical cords out of the way.  Do not use floor polish or wax that makes floors slippery. If you must use wax, use non-skid floor wax.  Do not have throw rugs and other things on the floor that can make you trip. What can I do with my stairs?  Do not leave  any items on the stairs.  Make sure that there are handrails on both sides of the stairs and use them. Fix handrails that are broken or loose. Make sure that handrails are as long as the stairways.  Check any carpeting to make sure that it is firmly attached to the stairs. Fix any carpet that is loose or worn.  Avoid having throw rugs at the top or bottom of the stairs. If you do have throw rugs, attach them to the floor with carpet tape.  Make sure that you have a light switch at the top of the stairs and the bottom of the stairs. If you do not have them, ask someone to add them for you. What else can I do to help prevent falls?  Wear shoes that:  Do not have high heels.  Have rubber bottoms.  Are comfortable and fit you well.  Are closed at the toe. Do not wear sandals.  If you use a stepladder:  Make sure that it is fully opened. Do not climb a closed stepladder.  Make sure that both sides of the stepladder are  locked into place.  Ask someone to hold it for you, if possible.  Clearly mark and make sure that you can see:  Any grab bars or handrails.  First and last steps.  Where the edge of each step is.  Use tools that help you move around (mobility aids) if they are needed. These include:  Canes.  Walkers.  Scooters.  Crutches.  Turn on the lights when you go into a dark area. Replace any light bulbs as soon as they burn out.  Set up your furniture so you have a clear path. Avoid moving your furniture around.  If any of your floors are uneven, fix them.  If there are any pets around you, be aware of where they are.  Review your medicines with your doctor. Some medicines can make you feel dizzy. This can increase your chance of falling. Ask your doctor what other things that you can do to help prevent falls. This information is not intended to replace advice given to you by your health care provider. Make sure you discuss any questions you have with your health care provider. Document Released: 07/18/2009 Document Revised: 02/27/2016 Document Reviewed: 10/26/2014 Elsevier Interactive Patient Education  2017 [employer].

## 2020-07-30 NOTE — Progress Notes (Addendum)
Subjective:   Candace Cervantes is a 86 y.o. female who presents for Medicare Annual (Subsequent) preventive examination.       Objective:    There were no vitals filed for this visit. There is no height or weight on file to calculate BMI.  Advanced Directives 01/15/2020 10/27/2018 07/25/2018 02/14/2018 07/12/2017 04/28/2017 02/20/2017  Does Patient Have a Medical Advance Directive? Yes No No No No Yes Yes  Type of Advance Directive [place];Living will [place];Living will  Does patient want to make changes to medical advance directive? - - - - - - No - Patient declined  Copy of [place] in Chart? - - - - - No - copy requested No - copy requested  Would patient like information on creating a medical advance directive? - No - Patient declined Yes (ED - Information included in AVS) - (No Data) - -  Pre-existing out of facility DNR order (yellow form or pink MOST form) - - - - - - -    Current Medications (verified) Outpatient Encounter Medications as of 07/30/2020  Medication Sig  . acetaminophen (TYLENOL) 325 MG tablet Take 325 mg by mouth in the morning and at bedtime.   Marland Kitchen amLODipine (NORVASC) 5 MG tablet Take 1 tablet (5 mg total) by mouth daily.  . citalopram (CELEXA) 10 MG tablet TAKE ONE TABLET BY MOUTH ONCE DAILY.  Marland Kitchen Cyanocobalamin (VITAMIN B-12 PO) Take 1 tablet by mouth daily.  . dorzolamide-timolol (COSOPT) 22.3-6.8 MG/ML ophthalmic solution 1 drop 2 (two) times daily.  . famotidine (PEPCID) 10 MG tablet Take 10 mg by mouth daily.  . hydrochlorothiazide (MICROZIDE) 12.5 MG capsule Take 1 capsule (12.5 mg total) by mouth daily.  Marland Kitchen latanoprost (XALATAN) 0.005 % ophthalmic solution 1 drop daily.  . Multiple Vitamin (MULTIVITAMIN WITH MINERALS) TABS tablet Take 1 tablet by mouth daily.  . nitroGLYCERIN (NITROSTAT) 0.4 MG SL tablet Place 1 tablet (0.4 mg total) under the tongue every 5  (five) minutes as needed for chest pain. One tablet under the tongue at onset of chest pains, repeat every five minutes as needed  . omeprazole (PRILOSEC) 20 MG capsule TAKE ONE CAPSULE BY MOUTH ONCE DAILY.  . pravastatin (PRAVACHOL) 40 MG tablet TAKE ONE TABLET BY MOUTH ONCE DAILY.   No facility-administered encounter medications on file as of 07/30/2020.    Allergies (verified) Patient has no known allergies.   History: Past Medical History:  Diagnosis Date  . Anxiety disorder   . Arthritis   . CAD (coronary artery disease) 2005   Nonobstructive 40% RCA lesion and normal ejection fraction at cath   . Chronic headache disorder 10/09/2017  . GERD (gastroesophageal reflux disease)   . Hyperlipidemia   . Hypertension   . Osteoporosis   . Pacemaker 2002   Permanent Placement  . Pacemaker 2007   Generator Changed   . Sinoatrial node dysfunction (HCC)   . SSS (sick sinus syndrome) ([place])   . Zenker diverticulum    previously evaluated by Dr. Erik Obey in [id number].    Past Surgical History:  Procedure Laterality Date  . CHOLECYSTECTOMY    . COLONOSCOPY  2003   no polyps.  . CYSTECTOMY     from back of the neck  . ESOPHAGOGASTRODUODENOSCOPY  2008   Dr. Oneida Alar, difficulty passing scope through UES, incomplete fibrous ring at GEJ, hh, multiple benign gastri polyps  . ESOPHAGOGASTRODUODENOSCOPY N/A 10/17/2013  ZENKER'S, STRICTURE: 10-12.8 MM,  NSAID GASTRITIS FG POLYPS  . ESOPHAGOGASTRODUODENOSCOPY (EGD) WITH ESOPHAGEAL DILATION N/A 10/30/2013   SLF: 1. Zenkers diverticulum with a large opening at the cricopharyngeus 2. 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 Innumerable Fundic gland polyps 4. Mild NSAID gastritis  . MALONEY DILATION N/A 10/17/2013   Procedure: MALONEY DILATION;  Surgeon: Danie Binder, MD;  [facility];  Service: Endoscopy;  Laterality: N/A;  with PEDS GASTROSCOPE  . PACEMAKER INSERTION  2002  . PACEMAKER PLACEMENT  2007   replacement   . PPM GENERATOR CHANGEOUT  N/A 01/15/2020   Procedure: PPM GENERATOR CHANGEOUT;  Surgeon: Evans Lance, MD;  Location: [facility];  Service: Cardiovascular;  Laterality: N/A;  . SAVORY DILATION N/A 10/17/2013   Procedure: SAVORY DILATION;  Surgeon: Danie Binder, MD;  Location: [facility];  Service: Endoscopy;  Laterality: N/A;  with PEDS GASTROSCOPE  . TOTAL ABDOMINAL HYSTERECTOMY  2002   Family History  Problem Relation Age of Onset  . Heart disease Mother   . Lung disease Sister   . Alcohol abuse Brother   . Cancer Brother        possible prostate  . Diabetes Sister   . Colon cancer Other        sibling, age 66   Social History   Socioeconomic History  . Marital status: Married    Spouse name: Not on file  . Number of children: 2  . Years of education: Not on file  . Highest education level: 12th grade  Occupational History  . Not on file  Tobacco Use  . Smoking status: Never Smoker  . Smokeless tobacco: Never Used  Vaping Use  . Vaping Use: Never used  Substance and Sexual Activity  . Alcohol use: No    Alcohol/week: 0.0 standard drinks  . Drug use: No  . Sexual activity: Not Currently  Other Topics Concern  . Not on file  Social History Narrative  . Not on file   Social Determinants of Health   Financial Resource Strain:   . Difficulty of Paying Living Expenses: Not on file  Food Insecurity:   . Worried About Charity fundraiser in the Last Year: Not on file  . Ran Out of Food in the Last Year: Not on file  Transportation Needs:   . Lack of Transportation (Medical): Not on file  . Lack of Transportation (Non-Medical): Not on file  Physical Activity:   . Days of Exercise per Week: Not on file  . Minutes of Exercise per Session: Not on file  Stress:   . Feeling of Stress : Not on file  Social Connections:   . Frequency of Communication with Friends and Family: Not on file  . Frequency of Social Gatherings with Friends and Family: Not on file  . Attends Religious  Services: Not on file  . Active Member of Clubs or Organizations: Not on file  . Attends Archivist Meetings: Not on file  . Marital Status: Not on file    Tobacco Counseling Counseling given: Not Answered                  Diabetic? NO          Activities of Daily Living No flowsheet data found.  Patient Care Team: Fayrene Helper, MD as PCP - General Lovena Le Champ Mungo, MD as PCP - Cardiology (Cardiology) Lendon Colonel, NP as Nurse Practitioner (Nurse Practitioner) Jacqulyn Ducking  Jerilynn Mages, MD (Inactive) as Attending Physician (Cardiology) Danie Binder, MD (Inactive) as Consulting Physician (Gastroenterology) Rutherford Guys, MD as Consulting Physician (Ophthalmology) Allyn Kenner, MD as Consulting Physician (Dermatology)  Indicate any recent Medical Services you may have received from other than [facility] providers in the past year (date may be approximate).     Assessment:   This is a routine wellness examination for Pattricia.  Hearing/Vision screen No exam data present  Dietary issues and exercise activities discussed:    Goals    .  Exercise 3x per week (30 min per time)      Recommend starting a routine exercise program at least 3 days a week for 30-45 minutes at a time as tolerated.      .  Patient Stated (pt-stated)      Will do more chair exercises during free time    .  Prevent falls      Will do the chair exercises to help strengthen her legs and take her time and always use walker when ambulating       Depression Screen PHQ 2/9 Scores 06/12/2020 05/09/2020 01/04/2020 07/27/2019 09/19/2018 07/25/2018 06/09/2018  PHQ - 2 Score 0 1 2 1 1 4 2   PHQ- 9 Score - - 5 - 2 12 5     Fall Risk Fall Risk  06/12/2020 05/09/2020 01/04/2020 12/08/2019 08/17/2019  Falls in the past year? 0 1 1 1  0  Number falls in past yr: 0 1 0 1 0  Injury with Fall? 0 0 0 0 0  Risk for fall due to : Impaired balance/gait;Impaired mobility - - - -  Follow up Falls evaluation  completed;Education provided;Falls prevention discussed - Falls evaluation completed - -    Any stairs in or around the home? Yes  If so, are there any without handrails? Yes  Home free of loose throw rugs in walkways, pet beds, electrical cords, etc? Yes  Adequate lighting in your home to reduce risk of falls? Yes   ASSISTIVE DEVICES UTILIZED TO PREVENT FALLS:  Life alert? Yes  Use of a cane, walker or w/c? Yes  Grab bars in the bathroom? Yes  Shower chair or bench in shower? Yes  Elevated toilet seat or a handicapped toilet? Yes   TIMED UP AND GO:  Was the test performed? No .     Cognitive Function: MMSE - Mini Mental State Exam 01/25/2018  Orientation to time 5  Orientation to Place 5  Registration 3  Attention/ Calculation 5  Recall 1  Language- name 2 objects 2  Language- repeat 1  Language- follow 3 step command 3  Language- read & follow direction 1  Write a sentence 1  Copy design 0  Total score 27     6CIT Screen 07/27/2019 07/25/2018 07/12/2017  What Year? 0 points 0 points 0 points  What month? 0 points 0 points 0 points  What time? 0 points 0 points 0 points  Count back from 20 0 points 0 points 0 points  Months in reverse 0 points 2 points 0 points  Repeat phrase 0 points 4 points 2 points  Total Score 0 6 2    Immunizations Immunization History  Administered Date(s) Administered  . Fluad Quad(high Dose 65+) 07/06/2019, 06/12/2020  . H1N1 09/20/2008  . Influenza Split 08/11/2011, 06/22/2012  . Influenza Whole 06/30/2007, 07/08/2009, 07/09/2010  . Influenza,inj,Quad PF,6+ Mos 06/20/2013, 05/28/2014, 06/13/2015, 06/02/2016, 06/14/2017, 06/09/2018  . Pneumococcal Conjugate-13 04/03/2014  . Pneumococcal Polysaccharide-23 09/04/2003  . Td  02/20/2004  . Tdap 08/18/2011  . Zoster 05/24/2008    TDAP status: Up to date Flu Vaccine status: Up to date Pneumococcal vaccine status: Up to date Covid-19 vaccine status: Completed vaccines  Qualifies for  Shingles Vaccine? Yes   Zostavax completed No   Shingrix Completed?: No.    Education has been provided regarding the importance of this vaccine. Patient has been advised to call insurance company to determine out of pocket expense if they have not yet received this vaccine. Advised may also receive vaccine at local pharmacy or Health Dept. Verbalized acceptance and understanding.  Screening Tests Health Maintenance  Topic Date Due  . COVID-19 Vaccine (1) Never done  . TETANUS/TDAP  08/17/2021  . INFLUENZA VACCINE  Completed  . DEXA SCAN  Completed  . PNA vac Low Risk Adult  Completed    Health Maintenance  Health Maintenance Due  Topic Date Due  . COVID-19 Vaccine (1) Never done    Colorectal cancer screening: No longer required.  Mammogram status: No longer required.  Bone Density: Complete   Lung Cancer Screening: (Low Dose CT Chest recommended if Age 91-80 years, 30 pack-year currently smoking OR have quit w/in 15years.) does not qualify.  Additional Screening:  Hepatitis C Screening: does qualify; Completed   Vision Screening: Recommended annual ophthalmology exams for early detection of glaucoma and other disorders of the eye. Is the patient up to date with their annual eye exam?  Yes    Who is the provider or what is the name of the office in which the patient attends annual eye exams? [facility]   If pt is not established with a provider, would they like to be referred to a provider to establish care? No .   Dental Screening: Recommended annual dental exams for proper oral hygiene  Community Resource Referral / Chronic Care Management: CRR required this visit?  No   CCM required this visit?  No      Plan:     I have personally reviewed and noted the following in the patient's chart:   . Medical and social history . Use of alcohol, tobacco or illicit drugs  . Current medications and supplements . Functional ability and status . Nutritional  status . Physical activity . Advanced directives . List of other physicians . Hospitalizations, surgeries, and ER visits in previous 12 months . Vitals . Screenings to include cognitive, depression, and falls . Referrals and appointments  In addition, I have reviewed and discussed with patient certain preventive protocols, quality metrics, and best practice recommendations. A written personalized care plan for preventive services as well as general preventive health recommendations were provided to patient.     Lonn [name], LPN   [id number]   Nurse Notes: AWV conducted over the phone with pt consent to televisit by audio. This visit took 20 min. Pt verbalizes understanding of care gaps and health maintenance. Provider present in the office, and pt in the home.

## 2020-08-15 ENCOUNTER — Other Ambulatory Visit: Payer: Self-pay

## 2020-08-15 ENCOUNTER — Ambulatory Visit (INDEPENDENT_AMBULATORY_CARE_PROVIDER_SITE_OTHER): Payer: Medicare Other | Admitting: Family Medicine

## 2020-08-15 ENCOUNTER — Encounter: Payer: Self-pay | Admitting: Family Medicine

## 2020-08-15 VITALS — BP 153/77 | HR 73 | Resp 16 | Ht 71.0 in | Wt 167.0 lb

## 2020-08-15 DIAGNOSIS — E7849 Other hyperlipidemia: Secondary | ICD-10-CM | POA: Diagnosis not present

## 2020-08-15 DIAGNOSIS — R600 Localized edema: Secondary | ICD-10-CM | POA: Diagnosis not present

## 2020-08-15 DIAGNOSIS — G3184 Mild cognitive impairment, so stated: Secondary | ICD-10-CM | POA: Diagnosis not present

## 2020-08-15 DIAGNOSIS — F22 Delusional disorders: Secondary | ICD-10-CM | POA: Diagnosis not present

## 2020-08-15 DIAGNOSIS — F411 Generalized anxiety disorder: Secondary | ICD-10-CM

## 2020-08-15 DIAGNOSIS — Z950 Presence of cardiac pacemaker: Secondary | ICD-10-CM

## 2020-08-15 DIAGNOSIS — I10 Essential (primary) hypertension: Secondary | ICD-10-CM

## 2020-08-15 NOTE — Patient Instructions (Addendum)
F/u in  Early march , we will calll with appointment, call if you need me before  No medication changes  Best for 2021 / 2022   Please be careful not to fall  Thanks for choosing [facility], we consider it a privelige to serve you.

## 2020-08-18 ENCOUNTER — Encounter: Payer: Self-pay | Admitting: Family Medicine

## 2020-08-18 DIAGNOSIS — R600 Localized edema: Secondary | ICD-10-CM | POA: Insufficient documentation

## 2020-08-18 DIAGNOSIS — F22 Delusional disorders: Secondary | ICD-10-CM | POA: Insufficient documentation

## 2020-08-18 NOTE — Assessment & Plan Note (Signed)
Hyperlipidemia:Low fat diet discussed and encouraged.   Lipid Panel  Lab Results  Component Value Date   CHOL 182 05/09/2020   HDL 88 05/09/2020   LDLCALC 83 05/09/2020   TRIG 58 05/09/2020   CHOLHDL 2.1 05/09/2020  Controlled, no change in medication Updated lab needed at/ before next visit.

## 2020-08-18 NOTE — Assessment & Plan Note (Signed)
No c/o angina, no significant CAD, has follow up for [pacemeaker regularly

## 2020-08-18 NOTE — Progress Notes (Signed)
Candace Cervantes     MRN: [id number]      DOB: 09-Sep-1925   HPI Candace Cervantes is here for follow up and re-evaluation of chronic medical conditions, medication management and review of any available recent lab and radiology data.  Preventive health is updated, specifically  Cancer screening and Immunization.   Questions or concerns regarding consultations or procedures which the PT has had in the interim are  addressed. The PT complains of mild ankle and foot swelling since starting higher dose of amlodipine. Swelling goes down overnight.  She denies PND orthopnea or excessive exertional fatigue above her baseline.  Daughter who accompanies her is concerned that in the past several months Candace Cervantes has been talking repeatedly about seeing people on her around her property trying to take it away and she is fixated on a thought that there is a specific gentleman who is trying to take her property from her.  There are no other hallucinations or distant delusions reported other than those regarding her property which is already deeded to her children.  Appetite is reportedly good as is her bowel movement.  She denies any burning with urination I will order to the urine.   ROS Denies recent fever or chills. Denies sinus pressure, nasal congestion, ear pain or sore throat. Denies chest congestion, productive cough or wheezing. Denies chest pains, palpitations and leg swelling Denies abdominal pain, nausea, vomiting,diarrhea or constipation.   Denies dysuria, frequency, hesitancy or incontinence. Denies uncontrolled  joint pain, swelling and limitation in mobility. Denies headaches, seizures, numbness, or tingling. Denies depression, anxiety or insomnia. Denies skin break down or rash.   PE  BP (!) 153/77   Pulse 73   Resp 16   Ht 5\' 11"  (1.803 m)   Wt 167 lb (75.8 kg)   SpO2 98%   BMI 23.29 kg/m   Patient alert and oriented and in no cardiopulmonary distress.  HEENT: No facial  asymmetry, EOMI,     Neck decreased ROM .  Chest: Clear to auscultation bilaterally. Breast: No asymmetry, tenderness or mass palpated in either breast. No nipple discharge, no axillary or supraclavicular adenopathy CVS: S1, S2 systolic  murmur, no S3.Regular rate.  ABD: Soft non tender.   Ext: trace  edema  MS: Adequate though reduced  ROM spine, shoulders, hips and knees.  Skin: Intact, no ulcerations or rash noted.  Psych: Good eye contact, normal affect. Memory intact not anxious or depressed appearing.  CNS: CN 2-12 intact, power,  normal throughout.no focal deficits noted.   Assessment & Plan  Essential hypertension Sub optimal though adequate control, no change in medication. DASH diet and commitment to daily physical activity for a minimum of 30 minutes discussed and encouraged, as a part of hypertension management. The importance of attaining a healthy weight is also discussed.  BP/Weight 08/15/2020 07/30/2020 06/12/2020 05/09/2020 05/07/2020 04/05/6377 02/09/8501  Systolic BP 774 128 786 767 209 470 962  Diastolic BP 77 82 82 90 78 67 63  Wt. (Lbs) 167 165 165.4 159.12 163.2 162 161.8  BMI 23.29 23.01 23.07 22.19 22.76 22.59 22.57       Hyperlipemia Hyperlipidemia:Low fat diet discussed and encouraged.   Lipid Panel  Lab Results  Component Value Date   CHOL 182 05/09/2020   HDL 88 05/09/2020   LDLCALC 83 05/09/2020   TRIG 58 05/09/2020   CHOLHDL 2.1 05/09/2020  Controlled, no change in medication Updated lab needed at/ before next visit.  ANXIETY DISORDER, GENERALIZED Controlled, no change in medication   CAD (coronary artery disease) No c/o angina, no significant CAD, has follow up for [pacemeaker regularly   Cardiac pacemaker in situ Monitored by cardiology and functioning well  Paranoid ideation ([place]) Relatively new occurrence of paranoid ideation regarding her property.  Patient reports that a specific person is trying to steal her land her  daughter who is with her states is that there is no truth to this and that has already been dated to both of her children.  She also at times reports seeing people around the property when they are not there again all of her delusional thinking and hallucinations are related directly to concern and anxiety over her land.  Daughter is reassured that this is not uncommon in older people and agree the situation will be monitored.  At no time does she be present any potential harm to herself or anyone else.  MCI (mild cognitive impairment) MMSE at visit is good, however paranoid ideation and delusions are becoming more apparent, discussed this with her daughter who will monitor, pt is at no risk to her self or to anyone  Leg edema Minimal and associated with increased dose of amlodipine,swelling resolves overnight, no medication change

## 2020-08-18 NOTE — Assessment & Plan Note (Signed)
MMSE at visit is good, however paranoid ideation and delusions are becoming more apparent, discussed this with her daughter who will monitor, pt is at no risk to her self or to anyone

## 2020-08-18 NOTE — Assessment & Plan Note (Signed)
Monitored by cardiology and functioning well

## 2020-08-18 NOTE — Assessment & Plan Note (Signed)
Sub optimal though adequate control, no change in medication. DASH diet and commitment to daily physical activity for a minimum of 30 minutes discussed and encouraged, as a part of hypertension management. The importance of attaining a healthy weight is also discussed.  BP/Weight 08/15/2020 07/30/2020 06/12/2020 05/09/2020 05/07/2020 0/14/1030 10/07/1436  Systolic BP 887 579 728 206 015 615 379  Diastolic BP 77 82 82 90 78 67 63  Wt. (Lbs) 167 165 165.4 159.12 163.2 162 161.8  BMI 23.29 23.01 23.07 22.19 22.76 22.59 22.57

## 2020-08-18 NOTE — Assessment & Plan Note (Signed)
Relatively new occurrence of paranoid ideation regarding her property.  Patient reports that a specific person is trying to steal her land her daughter who is with her states is that there is no truth to this and that has already been dated to both of her children.  She also at times reports seeing people around the property when they are not there again all of her delusional thinking and hallucinations are related directly to concern and anxiety over her land.  Daughter is reassured that this is not uncommon in older people and agree the situation will be monitored.  At no time does she be present any potential harm to herself or anyone else.

## 2020-08-18 NOTE — Assessment & Plan Note (Signed)
Minimal and associated with increased dose of amlodipine,swelling resolves overnight, no medication change

## 2020-08-18 NOTE — Assessment & Plan Note (Signed)
Controlled, no change in medication  

## 2020-08-19 ENCOUNTER — Ambulatory Visit (INDEPENDENT_AMBULATORY_CARE_PROVIDER_SITE_OTHER): Payer: Medicare Other

## 2020-08-19 ENCOUNTER — Other Ambulatory Visit: Payer: Self-pay | Admitting: Family Medicine

## 2020-08-19 DIAGNOSIS — I495 Sick sinus syndrome: Secondary | ICD-10-CM | POA: Diagnosis not present

## 2020-08-20 ENCOUNTER — Other Ambulatory Visit: Payer: Self-pay | Admitting: Family Medicine

## 2020-08-20 LAB — CUP PACEART REMOTE DEVICE CHECK
Battery Remaining Longevity: 105 mo
Battery Remaining Percentage: 95.5 %
Battery Voltage: 3.01 V
Brady Statistic AP VP Percent: 1.3 %
Brady Statistic AP VS Percent: 69 %
Brady Statistic AS VP Percent: 1 %
Brady Statistic AS VS Percent: 27 %
Brady Statistic RA Percent Paced: 66 %
Brady Statistic RV Percent Paced: 1.9 %
Date Time Interrogation Session: 20211115145143
Implantable Lead Implant Date: 20021126
Implantable Lead Implant Date: 20021126
Implantable Lead Location: 753859
Implantable Lead Location: 753860
Implantable Pulse Generator Implant Date: 20210412
Lead Channel Impedance Value: 400 Ohm
Lead Channel Impedance Value: 460 Ohm
Lead Channel Pacing Threshold Amplitude: 0.5 V
Lead Channel Pacing Threshold Amplitude: 1 V
Lead Channel Pacing Threshold Pulse Width: 0.5 ms
Lead Channel Pacing Threshold Pulse Width: 0.9 ms
Lead Channel Sensing Intrinsic Amplitude: 1.2 mV
Lead Channel Sensing Intrinsic Amplitude: 7 mV
Lead Channel Setting Pacing Amplitude: 2 V
Lead Channel Setting Pacing Amplitude: 2.5 V
Lead Channel Setting Pacing Pulse Width: 0.9 ms
Lead Channel Setting Sensing Sensitivity: 2 mV
Pulse Gen Model: 2272
Pulse Gen Serial Number: 3814236

## 2020-08-20 NOTE — Progress Notes (Signed)
Remote pacemaker transmission.   

## 2020-09-09 ENCOUNTER — Emergency Department (HOSPITAL_COMMUNITY): Payer: Medicare Other

## 2020-09-09 ENCOUNTER — Emergency Department (HOSPITAL_COMMUNITY)
Admission: EM | Admit: 2020-09-09 | Discharge: 2020-09-09 | Disposition: A | Discharge status: Home / Self Care | Payer: Medicare Other | Attending: Emergency Medicine | Admitting: Emergency Medicine

## 2020-09-09 ENCOUNTER — Other Ambulatory Visit: Payer: Self-pay

## 2020-09-09 ENCOUNTER — Encounter (HOSPITAL_COMMUNITY): Payer: Self-pay

## 2020-09-09 DIAGNOSIS — S46911A Strain of unspecified muscle, fascia and tendon at shoulder and upper arm level, right arm, initial encounter: Secondary | ICD-10-CM

## 2020-09-09 DIAGNOSIS — S4991XA Unspecified injury of right shoulder and upper arm, initial encounter: Secondary | ICD-10-CM | POA: Diagnosis not present

## 2020-09-09 DIAGNOSIS — I2510 Atherosclerotic heart disease of native coronary artery without angina pectoris: Secondary | ICD-10-CM | POA: Diagnosis not present

## 2020-09-09 DIAGNOSIS — I10 Essential (primary) hypertension: Secondary | ICD-10-CM | POA: Insufficient documentation

## 2020-09-09 DIAGNOSIS — Z79899 Other long term (current) drug therapy: Secondary | ICD-10-CM | POA: Insufficient documentation

## 2020-09-09 DIAGNOSIS — Z950 Presence of cardiac pacemaker: Secondary | ICD-10-CM | POA: Diagnosis not present

## 2020-09-09 DIAGNOSIS — W19XXXA Unspecified fall, initial encounter: Secondary | ICD-10-CM | POA: Diagnosis not present

## 2020-09-09 NOTE — ED Triage Notes (Signed)
[address] [address] last Thursday, was not in pain until yesterday. Right shoulder pain, fell from standing onto butt. Is usually ambulatory with cane. No LOC.

## 2020-09-09 NOTE — ED Provider Notes (Signed)
[facility] EMERGENCY DEPARTMENT Provider Note   CSN: [id number] Arrival date & time: 09/09/20  1110     History Chief Complaint  Patient presents with  . Shoulder Injury    Candace Cervantes is a 86 y.o. female.  HPI     Candace Cervantes is a 86 y.o. female with past medical history of coronary artery disease with cardiac pacemaker, hypertension,  who presents to the Emergency Department complaining of pain of her right shoulder for 1 day.  States that she slipped and fell 4 days ago landing on her buttocks and admits to reaching and pulling on objects attempting to pull herself up from the floor.  Initially, she denied any pain after her fall, but woke yesterday morning with pain and soreness along the back of her right shoulder and right side of her neck.  She describes the pain as constant and worsens with movement of her right arm.  She denies head injury, LOC, pain numbness or weakness down her right arm or into her fingers, headaches or dizziness. she also denies other injuries as a result of her fall.  She does not take any blood thinners.    Past Medical History:  Diagnosis Date  . Anxiety disorder   . Arthritis   . CAD (coronary artery disease) 2005   Nonobstructive 40% RCA lesion and normal ejection fraction at cath   . Chronic headache disorder 10/09/2017  . GERD (gastroesophageal reflux disease)   . Hyperlipidemia   . Hypertension   . Osteoporosis   . Pacemaker 2002   Permanent Placement  . Pacemaker 2007   Generator Changed   . Sinoatrial node dysfunction (HCC)   . SSS (sick sinus syndrome) ([place])   . Zenker diverticulum    previously evaluated by Dr. Erik Obey in 8250.     Patient Active Problem List   Diagnosis Date Noted  . Paranoid ideation ([place]) 08/18/2020  . Leg edema 08/18/2020  . Trigger ring finger of left hand 01/04/2020  . Arthritis of both hands 01/04/2020  . Unsteady gait 01/30/2018  . Stable angina ([place]) 09/13/2015  . GAD (generalized anxiety  disorder) 04/21/2015  . IGT (impaired glucose tolerance) 09/27/2014  . Osteopenia 02/20/2014  . Atrial fibrillation ([place]) 05/16/2013  . Sinoatrial node dysfunction (HCC)   . Cardiac pacemaker in situ 08/10/2011  . MCI (mild cognitive impairment) 12/15/2008  . Allergic rhinitis 09/20/2008  . ZENKER'S DIVERTICULUM 06/14/2008  . GERD 06/14/2008  . Hyperlipemia 02/15/2008  . ANXIETY DISORDER, GENERALIZED 02/15/2008  . Essential hypertension 02/15/2008  . Osteoarthritis 02/15/2008    Past Surgical History:  Procedure Laterality Date  . CHOLECYSTECTOMY    . COLONOSCOPY  2003   no polyps.  . CYSTECTOMY     from back of the neck  . ESOPHAGOGASTRODUODENOSCOPY  2008   Dr. Oneida Alar, difficulty passing scope through UES, incomplete fibrous ring at GEJ, hh, multiple benign gastri polyps  . ESOPHAGOGASTRODUODENOSCOPY N/A 10/17/2013   ZENKER'S, STRICTURE: 10-12.8 MM,  NSAID GASTRITIS FG POLYPS  . ESOPHAGOGASTRODUODENOSCOPY (EGD) WITH ESOPHAGEAL DILATION N/A 10/30/2013   SLF: 1. Zenkers diverticulum with a large opening at the cricopharyngeus 2. 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 Innumerable Fundic gland polyps 4. Mild NSAID gastritis  . MALONEY DILATION N/A 10/17/2013   Procedure: MALONEY DILATION;  Surgeon: Danie Binder, MD;  Location: [facility];  Service: Endoscopy;  Laterality: N/A;  with PEDS GASTROSCOPE  . PACEMAKER INSERTION  2002  . PACEMAKER PLACEMENT  2007   replacement   .  PPM GENERATOR CHANGEOUT N/A 01/15/2020   Procedure: PPM GENERATOR CHANGEOUT;  Surgeon: Evans Lance, MD;  Location: [facility];  Service: Cardiovascular;  Laterality: N/A;  . SAVORY DILATION N/A 10/17/2013   Procedure: SAVORY DILATION;  Surgeon: Danie Binder, MD;  Location: [facility];  Service: Endoscopy;  Laterality: N/A;  with PEDS GASTROSCOPE  . TOTAL ABDOMINAL HYSTERECTOMY  2002     OB History   No obstetric history on file.     Family History  Problem Relation Age of Onset  . Heart  disease Mother   . Lung disease Sister   . Alcohol abuse Brother   . Cancer Brother        possible prostate  . Diabetes Sister   . Colon cancer Other        sibling, age 40    Social History   Tobacco Use  . Smoking status: Never Smoker  . Smokeless tobacco: Never Used  Vaping Use  . Vaping Use: Never used  Substance Use Topics  . Alcohol use: No    Alcohol/week: 0.0 standard drinks  . Drug use: No    Home Medications Prior to Admission medications   Medication Sig Start Date End Date Taking? Authorizing Provider  acetaminophen (TYLENOL) 325 MG tablet Take 325 mg by mouth in the morning and at bedtime.     [provider]  amLODipine (NORVASC) 2.5 MG tablet TAKE 1 TABLET BY MOUTH ONCE DAILY. TAKE WITH 5MG  TABLET. 08/19/20   Fayrene Helper, MD  amLODipine (NORVASC) 5 MG tablet TAKE 1 TABLET BY MOUTH ONCE A DAY. 08/20/20   Fayrene Helper, MD  citalopram (CELEXA) 10 MG tablet TAKE ONE TABLET BY MOUTH ONCE DAILY. 06/19/20   Fayrene Helper, MD  Cyanocobalamin (VITAMIN B-12 PO) Take 1 tablet by mouth daily.    [provider]  dorzolamide-timolol (COSOPT) 22.3-6.8 MG/ML ophthalmic solution 1 drop 2 (two) times daily. 02/07/20   [provider]  famotidine (PEPCID) 10 MG tablet Take 10 mg by mouth daily.    [provider]  hydrochlorothiazide (MICROZIDE) 12.5 MG capsule Take 1 capsule (12.5 mg total) by mouth daily. 05/09/20   Fayrene Helper, MD  isosorbide mononitrate (IMDUR) 30 MG 24 hr tablet Take 15 mg by mouth daily. 07/22/20   [provider]  latanoprost (XALATAN) 0.005 % ophthalmic solution 1 drop daily. 02/04/20   [provider]  Multiple Vitamin (MULTIVITAMIN WITH MINERALS) TABS tablet Take 1 tablet by mouth daily.    [provider]  nitroGLYCERIN (NITROSTAT) 0.4 MG SL tablet Place 1 tablet (0.4 mg total) under the tongue every 5 (five) minutes as needed for chest pain. One tablet under the tongue  at onset of chest pains, repeat every five minutes as needed 11/29/19   Evans Lance, MD  omeprazole (PRILOSEC) 20 MG capsule TAKE ONE CAPSULE BY MOUTH ONCE DAILY. 06/19/20   Fayrene Helper, MD  pravastatin (PRAVACHOL) 40 MG tablet TAKE ONE TABLET BY MOUTH ONCE DAILY. 06/19/20   Fayrene Helper, MD    Allergies    Patient has no known allergies.  Review of Systems   Review of Systems  Constitutional: Negative for chills and fever.  Respiratory: Negative for shortness of breath.   Cardiovascular: Negative for chest pain.  Gastrointestinal: Negative for abdominal pain and nausea.  Genitourinary: Negative for difficulty urinating and dysuria.  Musculoskeletal: Positive for arthralgias (Right shoulder pain) and neck pain. Negative for joint swelling.  Skin: Negative for color change and wound.  Neurological: Negative for dizziness, speech difficulty, weakness, numbness and headaches.  All other systems reviewed and are negative.   Physical Exam Updated Vital Signs BP (!) 148/74   Pulse (!) 59   Temp (!) 97.4 F (36.3 C) (Temporal)   Resp 16   Ht 5\' 11"  (1.803 m)   Wt 73.5 kg   SpO2 100%   BMI 22.59 kg/m   Physical Exam Vitals and nursing note reviewed.  Constitutional:      General: She is not in acute distress.    Appearance: Normal appearance.  HENT:     Head: Atraumatic.  Eyes:     Conjunctiva/sclera: Conjunctivae normal.     Pupils: Pupils are equal, round, and reactive to light.  Neck:     Comments: Mild tenderness to palpation along the right cervical paraspinal muscles.  No midline cervical tenderness or bony step-offs.  Patient has full range of motion of the neck. Cardiovascular:     Rate and Rhythm: Normal rate and regular rhythm.  Pulmonary:     Effort: Pulmonary effort is normal.     Breath sounds: Normal breath sounds.  Chest:     Chest wall: No tenderness.  Abdominal:     General: There is no distension.     Palpations: Abdomen is soft.      Tenderness: There is no abdominal tenderness.  Musculoskeletal:        General: Tenderness and signs of injury present. No swelling.     Cervical back: Normal range of motion. Tenderness present. Muscular tenderness present. No spinous process tenderness.     Comments: Tender to palpation along the right trapezius and rhomboid muscles.  No tenderness of the right clavicle or AC joint.  Patient has full range of motion of the right shoulder without pain.  Skin:    General: Skin is warm.     Capillary Refill: Capillary refill takes less than 2 seconds.     Findings: No bruising, erythema or rash.  Neurological:     General: No focal deficit present.     Mental Status: She is alert.     Sensory: No sensory deficit.     Motor: No weakness.     ED Results / Procedures / Treatments   Labs (all labs ordered are listed, but only abnormal results are displayed) Labs Reviewed - No data to display  EKG None  Radiology DG Shoulder Right  Result Date: 09/09/2020 CLINICAL DATA:  [address] and injured right shoulder last Wednesday. EXAM: RIGHT SHOULDER - 2+ VIEW COMPARISON:  None. FINDINGS: Mild glenohumeral and AC joint degenerative changes. No acute fracture. The visualized right ribs are intact and the visualized right lung is grossly clear. IMPRESSION: Mild degenerative changes but no acute bony findings. Electronically Signed   By: Marijo Sanes M.D.   On: 09/09/2020 12:58    Procedures Procedures (including critical care time)  Medications Ordered in ED Medications - No data to display  ED Course  I have reviewed the triage vital signs and the nursing notes.  Pertinent labs & imaging results that were available during my care of the patient were reviewed by me and considered in my medical decision making (see chart for details).    MDM Rules/Calculators/A&P                          Patient here for evaluation of right shoulder pain secondary to a  mechanical fall that occurred 4 days  ago.  Woke with pain yesterday morning.  She has focal tenderness along the right cervical paraspinal muscles and right trapezius and rhomboid muscles.  No bony deformity or tenderness.  X-ray negative for acute bony injury.  Patient well-appearing.  Vital signs reviewed.  Neurovascularly intact.  Injury likely musculoskeletal.  Patient reassured agrees to treatment plan with Tylenol.  Return precautions discussed.  The patient appears reasonably screened and/or stabilized for discharge and I doubt any other medical condition or other [name] requiring further screening, evaluation, or treatment in the ED at this time prior to discharge.  Final Clinical Impression(s) / ED Diagnoses Final diagnoses:  Strain of right shoulder, initial encounter    Rx / DC Orders ED Discharge Orders    None       Kem Parkinson, PA-C 09/09/20 1501    Fredia Sorrow, MD 09/10/20 1537

## 2020-09-09 NOTE — Discharge Instructions (Addendum)
The x-ray today of your right shoulder did not show evidence of any bony injuries or dislocations.  You have likely strained the muscles.  You may apply warm heat on and off to the affected area.  Take Tylenol, 1 tablet every 4 hours as needed for pain.  Follow-up with your primary doctor for recheck in 1 week if not improving.  Return emergency department for any worsening symptoms.

## 2020-09-11 ENCOUNTER — Other Ambulatory Visit: Payer: Self-pay

## 2020-09-11 ENCOUNTER — Ambulatory Visit (INDEPENDENT_AMBULATORY_CARE_PROVIDER_SITE_OTHER): Payer: Medicare Other | Admitting: Family Medicine

## 2020-09-11 ENCOUNTER — Ambulatory Visit (HOSPITAL_COMMUNITY)
Admission: RE | Admit: 2020-09-11 | Discharge: 2020-09-11 | Disposition: A | Discharge status: Home / Self Care | Payer: Medicare Other | Source: Ambulatory Visit | Attending: Family Medicine | Admitting: Family Medicine

## 2020-09-11 ENCOUNTER — Encounter: Payer: Self-pay | Admitting: Family Medicine

## 2020-09-11 VITALS — BP 140/80 | HR 64 | Resp 15 | Ht 71.0 in | Wt 166.0 lb

## 2020-09-11 DIAGNOSIS — R531 Weakness: Secondary | ICD-10-CM | POA: Diagnosis not present

## 2020-09-11 DIAGNOSIS — M25551 Pain in right hip: Secondary | ICD-10-CM

## 2020-09-11 DIAGNOSIS — E7849 Other hyperlipidemia: Secondary | ICD-10-CM

## 2020-09-11 DIAGNOSIS — I10 Essential (primary) hypertension: Secondary | ICD-10-CM

## 2020-09-11 DIAGNOSIS — R2681 Unsteadiness on feet: Secondary | ICD-10-CM

## 2020-09-11 NOTE — Patient Instructions (Addendum)
F/U in 3 months, call if you need me sooner  X ray right hip today  Careful not to fall  Labs today because of weak spell, cBC, cnmp and EGFR   NO nitroglycerin unless you have chest pain

## 2020-09-12 LAB — CBC
Hematocrit: 36.6 % (ref 34.0–46.6)
Hemoglobin: 12.3 g/dL (ref 11.1–15.9)
MCH: 31.2 pg (ref 26.6–33.0)
MCHC: 33.6 g/dL (ref 31.5–35.7)
MCV: 93 fL (ref 79–97)
Platelets: 217 10*3/uL (ref 150–450)
RBC: 3.94 x10E6/uL (ref 3.77–5.28)
RDW: 13 % (ref 11.7–15.4)
WBC: 5.1 10*3/uL (ref 3.4–10.8)

## 2020-09-12 LAB — CMP14+EGFR
ALT: 23 IU/L (ref 0–32)
AST: 29 IU/L (ref 0–40)
Albumin/Globulin Ratio: 1.5 (ref 1.2–2.2)
Albumin: 4.4 g/dL (ref 3.5–4.6)
Alkaline Phosphatase: 71 IU/L (ref 44–121)
BUN/Creatinine Ratio: 27 (ref 12–28)
BUN: 26 mg/dL (ref 10–36)
Bilirubin Total: 0.3 mg/dL (ref 0.0–1.2)
CO2: 28 mmol/L (ref 20–29)
Calcium: 10.1 mg/dL (ref 8.7–10.3)
Chloride: 102 mmol/L (ref 96–106)
Creatinine, Ser: 0.96 mg/dL (ref 0.57–1.00)
GFR calc Af Amer: 59 mL/min/{1.73_m2} — ABNORMAL LOW (ref >59)
GFR calc non Af Amer: 51 mL/min/{1.73_m2} — ABNORMAL LOW (ref >59)
Globulin, Total: 2.9 g/dL (ref 1.5–4.5)
Glucose: 114 mg/dL — ABNORMAL HIGH (ref 65–99)
Potassium: 3.8 mmol/L (ref 3.5–5.2)
Sodium: 142 mmol/L (ref 134–144)
Total Protein: 7.3 g/dL (ref 6.0–8.5)

## 2020-09-13 ENCOUNTER — Telehealth: Payer: Self-pay | Admitting: Family Medicine

## 2020-09-13 NOTE — Telephone Encounter (Signed)
Please contact patient daugther Brunetta Genera [phone] regarding right hip xray results for patient.

## 2020-09-15 ENCOUNTER — Encounter: Payer: Self-pay | Admitting: Family Medicine

## 2020-09-15 NOTE — Assessment & Plan Note (Signed)
Home safety reviewed to reduce fall recurrence

## 2020-09-15 NOTE — Assessment & Plan Note (Signed)
Controlled, no change in medication DASH diet and commitment to daily physical activity for a minimum of 30 minutes discussed and encouraged, as a part of hypertension management. The importance of attaining a healthy weight is also discussed.  BP/Weight 09/11/2020 09/09/2020 08/15/2020 07/30/2020 06/12/2020 0/05/5693 12/10/50  Systolic BP 591 028 902 284 069 861 483  Diastolic BP 80 72 77 82 82 90 78  Wt. (Lbs) 166 162 167 165 165.4 159.12 163.2  BMI 23.15 22.59 23.29 23.01 23.07 22.19 22.76

## 2020-09-15 NOTE — Assessment & Plan Note (Signed)
New right hip apin s/p fall, needs X ray, doubt fracture

## 2020-09-15 NOTE — Progress Notes (Signed)
   Candace Cervantes     MRN: [id number]      DOB: 1924-11-26   HPI Candace Cervantes is here for follow up and re-evaluation of chronic medical conditions, medication management and review of any available recent lab and radiology data.  Preventive health is updated, specifically  Cancer screening and Immunization.   Questions or concerns regarding consultations or procedures which the PT has had in the interim are  addressed. The PT denies any adverse reactions to current medications since the last visit.  Fell on 12/02 and c/o hip pain C/o weak spell several weeks ago when her daughter found her in a chair stating she felt weak , gave her 2 NTG seemed to have made her even weaker, daughter educated as to when to safely give ROS Denies recent fever or chills. Denies sinus pressure, nasal congestion, ear pain or sore throat. Denies chest congestion, productive cough or wheezing. Denies chest pains, palpitations and leg swelling Denies abdominal pain, nausea, vomiting,diarrhea or constipation.   Denies dysuria, frequency, hesitancy or incontinence.  Denies headaches, seizures, numbness, or tingling. Denies depression, anxiety or insomnia. Denies skin break down or rash.   PE  BP 140/80   Pulse 64   Resp 15   Ht 5\' 11"  (1.803 m)   Wt 166 lb (75.3 kg)   SpO2 94%   BMI 23.15 kg/m   Patient alert and oriented and in no cardiopulmonary distress.  HEENT: No facial asymmetry, EOMI,     Neck supple .  Chest: Clear to auscultation bilaterally.  CVS: S1, S2 no murmurs, no S3.Regular rate.  ABD: Soft non tender.   Ext: No edema  MS: Adequate though reduced  ROM spine, shoulders, hips and knees.  Skin: Intact, no ulcerations or rash noted.  Psych: Good eye contact, normal affect. Memory impaired  not anxious or depressed appearing.  CNS: CN 2-12 intact, power,  normal throughout.no focal deficits noted.   Assessment & Plan  Hip pain, right New right hip apin s/p fall, needs X ray,  doubt fracture  Unsteady gait Home safety reviewed to reduce fall recurrence  Essential hypertension Controlled, no change in medication DASH diet and commitment to daily physical activity for a minimum of 30 minutes discussed and encouraged, as a part of hypertension management. The importance of attaining a healthy weight is also discussed.  BP/Weight 09/11/2020 09/09/2020 08/15/2020 07/30/2020 06/12/2020 05/05/4480 05/09/6313  Systolic BP 970 263 785 885 027 741 287  Diastolic BP 80 72 77 82 82 90 78  Wt. (Lbs) 166 162 167 165 165.4 159.12 163.2  BMI 23.15 22.59 23.29 23.01 23.07 22.19 22.76       Hyperlipemia Hyperlipidemia:Low fat diet discussed and encouraged.   Lipid Panel  Lab Results  Component Value Date   CHOL 182 05/09/2020   HDL 88 05/09/2020   LDLCALC 83 05/09/2020   TRIG 58 05/09/2020   CHOLHDL 2.1 05/09/2020

## 2020-09-15 NOTE — Assessment & Plan Note (Signed)
Hyperlipidemia:Low fat diet discussed and encouraged.   Lipid Panel  Lab Results  Component Value Date   CHOL 182 05/09/2020   HDL 88 05/09/2020   LDLCALC 83 05/09/2020   TRIG 58 05/09/2020   CHOLHDL 2.1 05/09/2020

## 2020-09-16 NOTE — Telephone Encounter (Signed)
Patients daughter aware

## 2020-09-17 ENCOUNTER — Other Ambulatory Visit: Payer: Self-pay | Admitting: Family Medicine

## 2020-09-18 DIAGNOSIS — H401134 Primary open-angle glaucoma, bilateral, indeterminate stage: Secondary | ICD-10-CM | POA: Diagnosis not present

## 2020-10-14 ENCOUNTER — Other Ambulatory Visit: Payer: Self-pay | Admitting: Family Medicine

## 2020-10-14 ENCOUNTER — Other Ambulatory Visit: Payer: Self-pay | Admitting: Internal Medicine

## 2020-10-15 ENCOUNTER — Other Ambulatory Visit: Payer: Self-pay | Admitting: Family Medicine

## 2020-11-18 ENCOUNTER — Ambulatory Visit (INDEPENDENT_AMBULATORY_CARE_PROVIDER_SITE_OTHER): Payer: Medicare Other

## 2020-11-18 ENCOUNTER — Other Ambulatory Visit: Payer: Self-pay | Admitting: Family Medicine

## 2020-11-18 DIAGNOSIS — I495 Sick sinus syndrome: Secondary | ICD-10-CM | POA: Diagnosis not present

## 2020-11-18 LAB — CUP PACEART REMOTE DEVICE CHECK
Battery Remaining Longevity: 103 mo
Battery Remaining Percentage: 95.5 %
Battery Voltage: 3.01 V
Brady Statistic AP VP Percent: 1.1 %
Brady Statistic AP VS Percent: 71 %
Brady Statistic AS VP Percent: 1 %
Brady Statistic AS VS Percent: 26 %
Brady Statistic RA Percent Paced: 69 %
Brady Statistic RV Percent Paced: 1.6 %
Date Time Interrogation Session: 20220214040909
Implantable Lead Implant Date: 20021126
Implantable Lead Implant Date: 20021126
Implantable Lead Location: 753859
Implantable Lead Location: 753860
Implantable Pulse Generator Implant Date: 20210412
Lead Channel Impedance Value: 400 Ohm
Lead Channel Impedance Value: 460 Ohm
Lead Channel Pacing Threshold Amplitude: 0.5 V
Lead Channel Pacing Threshold Amplitude: 1 V
Lead Channel Pacing Threshold Pulse Width: 0.5 ms
Lead Channel Pacing Threshold Pulse Width: 0.9 ms
Lead Channel Sensing Intrinsic Amplitude: 1.2 mV
Lead Channel Sensing Intrinsic Amplitude: 7.7 mV
Lead Channel Setting Pacing Amplitude: 2 V
Lead Channel Setting Pacing Amplitude: 2.5 V
Lead Channel Setting Pacing Pulse Width: 0.9 ms
Lead Channel Setting Sensing Sensitivity: 2 mV
Pulse Gen Model: 2272
Pulse Gen Serial Number: 3814236

## 2020-11-22 NOTE — Progress Notes (Signed)
Remote pacemaker transmission.   

## 2020-12-10 ENCOUNTER — Ambulatory Visit: Payer: Medicare Other | Admitting: Family Medicine

## 2020-12-12 ENCOUNTER — Other Ambulatory Visit: Payer: Self-pay

## 2020-12-12 ENCOUNTER — Encounter: Payer: Self-pay | Admitting: Family Medicine

## 2020-12-12 ENCOUNTER — Telehealth (INDEPENDENT_AMBULATORY_CARE_PROVIDER_SITE_OTHER): Payer: Medicare Other | Admitting: Family Medicine

## 2020-12-12 VITALS — Ht 71.0 in | Wt 162.0 lb

## 2020-12-12 DIAGNOSIS — E7849 Other hyperlipidemia: Secondary | ICD-10-CM | POA: Diagnosis not present

## 2020-12-12 DIAGNOSIS — I10 Essential (primary) hypertension: Secondary | ICD-10-CM

## 2020-12-12 DIAGNOSIS — E559 Vitamin D deficiency, unspecified: Secondary | ICD-10-CM

## 2020-12-12 DIAGNOSIS — I4891 Unspecified atrial fibrillation: Secondary | ICD-10-CM

## 2020-12-12 DIAGNOSIS — K219 Gastro-esophageal reflux disease without esophagitis: Secondary | ICD-10-CM | POA: Diagnosis not present

## 2020-12-12 DIAGNOSIS — F411 Generalized anxiety disorder: Secondary | ICD-10-CM

## 2020-12-12 DIAGNOSIS — R7302 Impaired glucose tolerance (oral): Secondary | ICD-10-CM | POA: Diagnosis not present

## 2020-12-12 DIAGNOSIS — R600 Localized edema: Secondary | ICD-10-CM

## 2020-12-12 NOTE — Progress Notes (Signed)
Virtual Visit via Telephone Note  I connected with Candace Cervantes on 12/12/20 at  3:00 PM EST by telephone and verified that I am speaking with the correct person using two identifiers.  Location: Patient: home Provider: office   I discussed the limitations, risks, security and privacy concerns of performing an evaluation and management service by telephone and the availability of in person appointments. I also discussed with the patient that there may be a patient responsible charge related to this service. The patient expressed understanding and agreed to proceed.   History of Present Illness: Reports doing well overall, gets lonely at times, denies depression Has had no falls since last visit Denies recent fever or chills. Denies sinus pressure, nasal congestion, ear pain or sore throat. Denies chest congestion, productive cough or wheezing. Denies chest pains, palpitations and leg swelling Denies abdominal pain, nausea, vomiting,diarrhea or constipation.   Denies dysuria, frequency, hesitancy or incontinence. Denies uncontrolled  joint pain, swelling and  Uses her walker for safe mobility Denies headaches, seizures, numbness, or tingling. Denies uncontrolled  depression, anxiety or insomnia.Feels lonely at times Denies skin break down or rash.        Observations/Objective: Ht 5\' 11"  (1.803 m)   Wt 162 lb (73.5 kg)   BMI 22.59 kg/m  Good communication with no confusion and intact memory. Alert and oriented x 3 No signs of respiratory distress during speech    Assessment and Plan: Essential hypertension DASH diet and commitment to daily physical activity for a minimum of 30 minutes discussed and encouraged, as a part of hypertension management. The importance of attaining a healthy weight is also discussed.  BP/Weight 12/12/2020 09/11/2020 09/09/2020 08/15/2020 07/30/2020 03/08/8755 01/05/3294  Systolic BP - 188 416 606 301 601 093  Diastolic BP - 80 72 77 82 82 90  Wt.  (Lbs) 162 166 162 167 165 165.4 159.12  BMI 22.59 23.15 22.59 23.29 23.01 23.07 22.19   Adequate control, no med change    Hyperlipemia Hyperlipidemia:Low fat diet discussed and encouraged.   Lipid Panel  Lab Results  Component Value Date   CHOL 182 05/09/2020   HDL 88 05/09/2020   LDLCALC 83 05/09/2020   TRIG 58 05/09/2020   CHOLHDL 2.1 05/09/2020   Updated lab needed at/ before next visit.     GAD (generalized anxiety disorder) Controlled, no change in medication   GERD Controlled, no change in medication     Follow Up Instructions:    I discussed the assessment and treatment plan with the patient. The patient was provided an opportunity to ask questions and all were answered. The patient agreed with the plan and demonstrated an understanding of the instructions.   The patient was advised to call back or seek an in-person evaluation if the symptoms worsen or if the condition fails to improve as anticipated.  I provided 22 minutes of non-face-to-face time during this encounter.   [name], MD

## 2020-12-12 NOTE — Patient Instructions (Addendum)
Annual physical exam in office with MD last week in August,flu vaccine at visit, call if you need me before  Please get fasting lipid, chem 7  and EGFr, and vit D  next week  Continue to use your walker and be careful not to fall  Thanks for choosing [facility], we consider it a privelige to serve you.

## 2020-12-14 ENCOUNTER — Encounter: Payer: Self-pay | Admitting: Family Medicine

## 2020-12-14 NOTE — Assessment & Plan Note (Signed)
DASH diet and commitment to daily physical activity for a minimum of 30 minutes discussed and encouraged, as a part of hypertension management. The importance of attaining a healthy weight is also discussed.  BP/Weight 12/12/2020 09/11/2020 09/09/2020 08/15/2020 07/30/2020 4/0/9927 8/0/0447  Systolic BP - 158 063 868 548 830 141  Diastolic BP - 80 72 77 82 82 90  Wt. (Lbs) 162 166 162 167 165 165.4 159.12  BMI 22.59 23.15 22.59 23.29 23.01 23.07 22.19   Adequate control, no med change

## 2020-12-14 NOTE — Assessment & Plan Note (Signed)
Hyperlipidemia:Low fat diet discussed and encouraged.   Lipid Panel  Lab Results  Component Value Date   CHOL 182 05/09/2020   HDL 88 05/09/2020   LDLCALC 83 05/09/2020   TRIG 58 05/09/2020   CHOLHDL 2.1 05/09/2020   Updated lab needed at/ before next visit.

## 2020-12-14 NOTE — Assessment & Plan Note (Signed)
Controlled, no change in medication  

## 2020-12-18 DIAGNOSIS — I10 Essential (primary) hypertension: Secondary | ICD-10-CM | POA: Diagnosis not present

## 2020-12-18 DIAGNOSIS — R600 Localized edema: Secondary | ICD-10-CM | POA: Diagnosis not present

## 2020-12-18 DIAGNOSIS — E559 Vitamin D deficiency, unspecified: Secondary | ICD-10-CM | POA: Diagnosis not present

## 2020-12-18 DIAGNOSIS — I4891 Unspecified atrial fibrillation: Secondary | ICD-10-CM | POA: Diagnosis not present

## 2020-12-18 DIAGNOSIS — R7302 Impaired glucose tolerance (oral): Secondary | ICD-10-CM | POA: Diagnosis not present

## 2020-12-18 DIAGNOSIS — E7849 Other hyperlipidemia: Secondary | ICD-10-CM | POA: Diagnosis not present

## 2020-12-19 LAB — BASIC METABOLIC PANEL
BUN/Creatinine Ratio: 17 (ref 12–28)
BUN: 17 mg/dL (ref 10–36)
CO2: 25 mmol/L (ref 20–29)
Calcium: 9.8 mg/dL (ref 8.7–10.3)
Chloride: 102 mmol/L (ref 96–106)
Creatinine, Ser: 0.98 mg/dL (ref 0.57–1.00)
Glucose: 98 mg/dL (ref 65–99)
Potassium: 4 mmol/L (ref 3.5–5.2)
Sodium: 141 mmol/L (ref 134–144)
eGFR: 54 mL/min/{1.73_m2} — ABNORMAL LOW (ref >59)

## 2020-12-19 LAB — LIPID PANEL
Chol/HDL Ratio: 2 ratio (ref 0.0–4.4)
Cholesterol, Total: 186 mg/dL (ref 100–199)
HDL: 91 mg/dL (ref >39)
LDL Chol Calc (NIH): 85 mg/dL (ref 0–99)
Triglycerides: 51 mg/dL (ref 0–149)
VLDL Cholesterol Cal: 10 mg/dL (ref 5–40)

## 2020-12-19 LAB — VITAMIN D 25 HYDROXY (VIT D DEFICIENCY, FRACTURES): Vit D, 25-Hydroxy: 31.7 ng/mL (ref 30.0–100.0)

## 2021-01-13 ENCOUNTER — Other Ambulatory Visit: Payer: Self-pay | Admitting: Family Medicine

## 2021-02-11 ENCOUNTER — Other Ambulatory Visit: Payer: Self-pay | Admitting: Family Medicine

## 2021-02-17 ENCOUNTER — Ambulatory Visit (INDEPENDENT_AMBULATORY_CARE_PROVIDER_SITE_OTHER): Payer: Medicare Other

## 2021-02-17 DIAGNOSIS — I495 Sick sinus syndrome: Secondary | ICD-10-CM | POA: Diagnosis not present

## 2021-02-18 LAB — CUP PACEART REMOTE DEVICE CHECK
Battery Remaining Longevity: 102 mo
Battery Remaining Percentage: 95.5 %
Battery Voltage: 3.01 V
Brady Statistic AP VP Percent: 1 %
Brady Statistic AP VS Percent: 73 %
Brady Statistic AS VP Percent: 1 %
Brady Statistic AS VS Percent: 25 %
Brady Statistic RA Percent Paced: 71 %
Brady Statistic RV Percent Paced: 1.4 %
Date Time Interrogation Session: 20220516020013
Implantable Lead Implant Date: 20021126
Implantable Lead Implant Date: 20021126
Implantable Lead Location: 753859
Implantable Lead Location: 753860
Implantable Pulse Generator Implant Date: 20210412
Lead Channel Impedance Value: 410 Ohm
Lead Channel Impedance Value: 460 Ohm
Lead Channel Pacing Threshold Amplitude: 0.5 V
Lead Channel Pacing Threshold Amplitude: 1 V
Lead Channel Pacing Threshold Pulse Width: 0.5 ms
Lead Channel Pacing Threshold Pulse Width: 0.9 ms
Lead Channel Sensing Intrinsic Amplitude: 1.4 mV
Lead Channel Sensing Intrinsic Amplitude: 6.9 mV
Lead Channel Setting Pacing Amplitude: 2 V
Lead Channel Setting Pacing Amplitude: 2.5 V
Lead Channel Setting Pacing Pulse Width: 0.9 ms
Lead Channel Setting Sensing Sensitivity: 2 mV
Pulse Gen Model: 2272
Pulse Gen Serial Number: 3814236

## 2021-03-11 ENCOUNTER — Emergency Department (HOSPITAL_COMMUNITY): Payer: Medicare Other

## 2021-03-11 ENCOUNTER — Encounter (HOSPITAL_COMMUNITY): Payer: Self-pay | Admitting: *Deleted

## 2021-03-11 ENCOUNTER — Emergency Department (HOSPITAL_COMMUNITY)
Admission: EM | Admit: 2021-03-11 | Discharge: 2021-03-11 | Disposition: A | Discharge status: Home / Self Care | Payer: Medicare Other | Attending: Emergency Medicine | Admitting: Emergency Medicine

## 2021-03-11 DIAGNOSIS — W07XXXA Fall from chair, initial encounter: Secondary | ICD-10-CM | POA: Insufficient documentation

## 2021-03-11 DIAGNOSIS — I10 Essential (primary) hypertension: Secondary | ICD-10-CM | POA: Insufficient documentation

## 2021-03-11 DIAGNOSIS — I2510 Atherosclerotic heart disease of native coronary artery without angina pectoris: Secondary | ICD-10-CM | POA: Insufficient documentation

## 2021-03-11 DIAGNOSIS — Z79899 Other long term (current) drug therapy: Secondary | ICD-10-CM | POA: Diagnosis not present

## 2021-03-11 DIAGNOSIS — Z950 Presence of cardiac pacemaker: Secondary | ICD-10-CM | POA: Insufficient documentation

## 2021-03-11 DIAGNOSIS — M24552 Contracture, left hip: Secondary | ICD-10-CM

## 2021-03-11 DIAGNOSIS — M25552 Pain in left hip: Secondary | ICD-10-CM | POA: Diagnosis not present

## 2021-03-11 DIAGNOSIS — W010XXA Fall on same level from slipping, tripping and stumbling without subsequent striking against object, initial encounter: Secondary | ICD-10-CM

## 2021-03-11 MED ORDER — ACETAMINOPHEN 325 MG PO TABS
650.0000 mg | ORAL_TABLET | Freq: Once | ORAL | Status: AC
  Administered 2021-03-11: 650 mg via ORAL
  Filled 2021-03-11: qty 2

## 2021-03-11 NOTE — Discharge Instructions (Addendum)
It was our pleasure to provide your ER care today - we hope that you feel better.  Your xray shows no acute fracture.   Fall precautions - be very careful to help minimize risk of falling.   Take acetaminophen as need for pain.  Follow up with primary care doctor in 1-2 weeks if symptoms fail to improve/resolve.  Return to ER if worse, new symptoms, new or severe pain, fainting, trouble breathing, or other concern.

## 2021-03-11 NOTE — ED Provider Notes (Signed)
[facility] EMERGENCY DEPARTMENT Provider Note   CSN: [id number] Arrival date & time: 03/11/21  1033     History Chief Complaint  Patient presents with  . Fall    Candace Cervantes is a 87 y.o. female.  Patient c/o fall last night at home, states tripped getting up out of chair. No loc. No faintness or dizziness prior to fall. C/o left hip pain post fall, mild-mod, constant, dull, non radiating. Has been ambulatory post fall. Denies headache. No neck or back pain. No chest pain or sob. No abd pain or nv. Denies other extremity pain or injury. Skin intact. No meds pta. No anticoag use.  The history is provided by the patient and a relative.  Fall Pertinent negatives include no chest pain, no abdominal pain, no headaches and no shortness of breath.       Past Medical History:  Diagnosis Date  . Anxiety disorder   . Arthritis   . CAD (coronary artery disease) 2005   Nonobstructive 40% RCA lesion and normal ejection fraction at cath   . Chronic headache disorder 10/09/2017  . GERD (gastroesophageal reflux disease)   . Hyperlipidemia   . Hypertension   . Osteoporosis   . Pacemaker 2002   Permanent Placement  . Pacemaker 2007   Generator Changed   . Sinoatrial node dysfunction (HCC)   . SSS (sick sinus syndrome) ([place])   . Zenker diverticulum    previously evaluated by Dr. Erik Obey in [id number].     Patient Active Problem List   Diagnosis Date Noted  . Hip pain, right 09/11/2020  . Paranoid ideation ([place]) 08/18/2020  . Trigger ring finger of left hand 01/04/2020  . Arthritis of both hands 01/04/2020  . Unsteady gait 01/30/2018  . Stable angina ([place]) 09/13/2015  . GAD (generalized anxiety disorder) 04/21/2015  . IGT (impaired glucose tolerance) 09/27/2014  . Osteopenia 02/20/2014  . Atrial fibrillation ([place]) 05/16/2013  . Sinoatrial node dysfunction (HCC)   . Cardiac pacemaker in situ 08/10/2011  . MCI (mild cognitive impairment) 12/15/2008  . Allergic rhinitis 09/20/2008  .  ZENKER'S DIVERTICULUM 06/14/2008  . GERD 06/14/2008  . Hyperlipemia 02/15/2008  . ANXIETY DISORDER, GENERALIZED 02/15/2008  . Essential hypertension 02/15/2008  . Osteoarthritis 02/15/2008    Past Surgical History:  Procedure Laterality Date  . CHOLECYSTECTOMY    . COLONOSCOPY  2003   no polyps.  . CYSTECTOMY     from back of the neck  . ESOPHAGOGASTRODUODENOSCOPY  2008   Dr. Oneida Alar, difficulty passing scope through UES, incomplete fibrous ring at GEJ, hh, multiple benign gastri polyps  . ESOPHAGOGASTRODUODENOSCOPY N/A 10/17/2013   ZENKER'S, STRICTURE: 10-12.8 MM,  NSAID GASTRITIS FG POLYPS  . ESOPHAGOGASTRODUODENOSCOPY (EGD) WITH ESOPHAGEAL DILATION N/A 10/30/2013   SLF: 1. Zenkers diverticulum with a large opening at the cricopharyngeus 2. 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 Innumerable Fundic gland polyps 4. Mild NSAID gastritis  . MALONEY DILATION N/A 10/17/2013   Procedure: MALONEY DILATION;  Surgeon: Danie Binder, MD;  Location: [facility] ENDO SUITE;  Service: Endoscopy;  Laterality: N/A;  with PEDS GASTROSCOPE  . PACEMAKER INSERTION  2002  . PACEMAKER PLACEMENT  2007   replacement   . PPM GENERATOR CHANGEOUT N/A 01/15/2020   Procedure: PPM GENERATOR CHANGEOUT;  Surgeon: Evans Lance, MD;  Location: [facility] LAB;  Service: Cardiovascular;  Laterality: N/A;  . SAVORY DILATION N/A 10/17/2013   Procedure: SAVORY DILATION;  Surgeon: Danie Binder, MD;  Location: [facility] ENDO SUITE;  Service:  Endoscopy;  Laterality: N/A;  with PEDS GASTROSCOPE  . TOTAL ABDOMINAL HYSTERECTOMY  2002     OB History   No obstetric history on file.     Family History  Problem Relation Age of Onset  . Heart disease Mother   . Lung disease Sister   . Alcohol abuse Brother   . Cancer Brother        possible prostate  . Diabetes Sister   . Colon cancer Other        sibling, age 61    Social History   Tobacco Use  . Smoking status: Never Smoker  . Smokeless tobacco: Never Used  Vaping Use  .  Vaping Use: Never used  Substance Use Topics  . Alcohol use: No    Alcohol/week: 0.0 standard drinks  . Drug use: No    Home Medications Prior to Admission medications   Medication Sig Start Date End Date Taking? Authorizing Provider  acetaminophen (TYLENOL) 325 MG tablet Take 325 mg by mouth in the morning and at bedtime.     [provider]  amLODipine (NORVASC) 2.5 MG tablet TAKE 1 TABLET BY MOUTH ONCE DAILY. TAKE WITH 5MG  TABLET. 02/11/21   Fayrene Helper, MD  amLODipine (NORVASC) 5 MG tablet TAKE 1 TABLET BY MOUTH ONCE A DAY. 02/11/21   Fayrene Helper, MD  citalopram (CELEXA) 10 MG tablet TAKE ONE TABLET BY MOUTH ONCE DAILY. 02/11/21   Fayrene Helper, MD  Cyanocobalamin (VITAMIN B-12 PO) Take 1 tablet by mouth daily.    [provider]  dorzolamide-timolol (COSOPT) 22.3-6.8 MG/ML ophthalmic solution 1 drop 2 (two) times daily. 02/07/20   [provider]  famotidine (PEPCID) 10 MG tablet Take 10 mg by mouth daily.    [provider]  hydrochlorothiazide (MICROZIDE) 12.5 MG capsule TAKE 1 CAPSULE BY MOUTH ONCE DAILY. 02/11/21   Fayrene Helper, MD  isosorbide mononitrate (IMDUR) 30 MG 24 hr tablet TAKE 1/2 TABLET BY MOUTH ONCE DAILY. 02/11/21   Fayrene Helper, MD  latanoprost (XALATAN) 0.005 % ophthalmic solution 1 drop daily. 02/04/20   [provider]  Multiple Vitamin (MULTIVITAMIN WITH MINERALS) TABS tablet Take 1 tablet by mouth daily.    [provider]  nitroGLYCERIN (NITROSTAT) 0.4 MG SL tablet Place 1 tablet (0.4 mg total) under the tongue every 5 (five) minutes as needed for chest pain. One tablet under the tongue at onset of chest pains, repeat every five minutes as needed 11/29/19   Evans Lance, MD  omeprazole (PRILOSEC) 20 MG capsule TAKE ONE CAPSULE BY MOUTH ONCE DAILY. 02/11/21   Fayrene Helper, MD  OVER THE COUNTER MEDICATION 1 each daily. Super beets chews    [provider]  pravastatin  (PRAVACHOL) 40 MG tablet TAKE ONE TABLET BY MOUTH ONCE DAILY. 02/11/21   Fayrene Helper, MD    Allergies    Patient has no known allergies.  Review of Systems   Review of Systems  Constitutional: Negative for fever.  HENT: Negative for nosebleeds.   Eyes: Negative for pain and visual disturbance.  Respiratory: Negative for shortness of breath.   Cardiovascular: Negative for chest pain.  Gastrointestinal: Negative for abdominal pain, nausea and vomiting.  Genitourinary: Negative for flank pain.  Musculoskeletal: Negative for back pain and neck pain.  Skin: Negative for wound.  Neurological: Negative for weakness, numbness and headaches.  Hematological: Does not bruise/bleed easily.  Psychiatric/Behavioral: Negative for agitation.    Physical Exam Updated Vital  Signs BP 124/69 (BP Location: Right Arm)   Pulse (!) 59   Temp 98.8 F (37.1 C) (Oral)   Resp 18   SpO2 97%   Physical Exam Vitals and nursing note reviewed.  Constitutional:      Appearance: Normal appearance. She is well-developed.  HENT:     Head: Atraumatic.     Nose: Nose normal.     Mouth/Throat:     Mouth: Mucous membranes are moist.  Eyes:     General: No scleral icterus.    Conjunctiva/sclera: Conjunctivae normal.     Pupils: Pupils are equal, round, and reactive to light.  Neck:     Vascular: No carotid bruit.     Trachea: No tracheal deviation.  Cardiovascular:     Rate and Rhythm: Normal rate and regular rhythm.     Pulses: Normal pulses.     Heart sounds: Normal heart sounds. No murmur heard. No friction rub. No gallop.   Pulmonary:     Effort: Pulmonary effort is normal. No respiratory distress.     Breath sounds: Normal breath sounds.  Chest:     Chest wall: No tenderness.  Abdominal:     General: There is no distension.     Palpations: Abdomen is soft.     Tenderness: There is no abdominal tenderness.  Genitourinary:    Comments: No cva tenderness.  Musculoskeletal:         General: No swelling or tenderness.     Cervical back: Normal range of motion and neck supple. No rigidity or tenderness. No muscular tenderness.     Right lower leg: No edema.     Left lower leg: No edema.     Comments: CTLS spine, non tender, aligned, no step off. Distal pulses palp bil. Mild tenderness left hip otherwise good rom bil extremities without pain or focal bony tenderness.   Skin:    General: Skin is warm and dry.     Findings: No rash.  Neurological:     Mental Status: She is alert.     Comments: Alert, speech normal. Speech clear/fluent. Motor/sens grossly intact bil.   Psychiatric:        Mood and Affect: Mood normal.     ED Results / Procedures / Treatments   Labs (all labs ordered are listed, but only abnormal results are displayed) Labs Reviewed - No data to display  EKG None  Radiology DG HIP UNILAT W OR W/O PELVIS 2-3 VIEWS LEFT  Result Date: 03/11/2021 CLINICAL DATA:  Pain following fall EXAM: DG HIP (WITH OR WITHOUT PELVIS) 2-3V LEFT COMPARISON:  September 11, 2020 FINDINGS: Frontal pelvis as well as frontal and lateral left hip images were obtained. No fracture or dislocation. There is moderate symmetric narrowing of each hip joint. No erosive change. Sacroiliac joints appear unremarkable. IMPRESSION: Moderate symmetric narrowing of each hip joint, stable. No evident fracture or dislocation. Electronically Signed   By: Lowella Grip III M.D.   On: 03/11/2021 12:14    Procedures Procedures   Medications Ordered in ED Medications - No data to display  ED Course  I have reviewed the triage vital signs and the nursing notes.  Pertinent labs & imaging results that were available during my care of the patient were reviewed by me and considered in my medical decision making (see chart for details).    MDM Rules/Calculators/A&P  Imaging ordered.   Reviewed nursing notes and prior charts for additional history.   Xrays  reviewed/interpreted by me - no fx.  Acetaminophen po.   Pt appears stable for d/c.    Final Clinical Impression(s) / ED Diagnoses Final diagnoses:  None    Rx / DC Orders ED Discharge Orders    None       Lajean Saver, MD 03/11/21 1316

## 2021-03-11 NOTE — ED Triage Notes (Signed)
Fell last night. States she got up out of the chair and fell, states she is sore and cannot pinpoint an area of injury

## 2021-03-12 NOTE — Progress Notes (Signed)
Remote pacemaker transmission.   

## 2021-03-14 ENCOUNTER — Other Ambulatory Visit: Payer: Self-pay | Admitting: Family Medicine

## 2021-04-10 ENCOUNTER — Other Ambulatory Visit: Payer: Self-pay | Admitting: Internal Medicine

## 2021-04-14 ENCOUNTER — Other Ambulatory Visit: Payer: Self-pay | Admitting: Family Medicine

## 2021-05-13 ENCOUNTER — Encounter: Payer: Medicare Other | Admitting: Internal Medicine

## 2021-05-14 ENCOUNTER — Other Ambulatory Visit: Payer: Self-pay | Admitting: Family Medicine

## 2021-05-15 ENCOUNTER — Encounter: Payer: Medicare Other | Admitting: Family Medicine

## 2021-05-19 ENCOUNTER — Ambulatory Visit (INDEPENDENT_AMBULATORY_CARE_PROVIDER_SITE_OTHER): Payer: Medicare Other

## 2021-05-19 DIAGNOSIS — I495 Sick sinus syndrome: Secondary | ICD-10-CM | POA: Diagnosis not present

## 2021-05-21 LAB — CUP PACEART REMOTE DEVICE CHECK
Battery Remaining Longevity: 97 mo
Battery Remaining Percentage: 91 %
Battery Voltage: 3.01 V
Brady Statistic AP VP Percent: 1 %
Brady Statistic AP VS Percent: 71 %
Brady Statistic AS VP Percent: 1 %
Brady Statistic AS VS Percent: 26 %
Brady Statistic RA Percent Paced: 70 %
Brady Statistic RV Percent Paced: 1.2 %
Date Time Interrogation Session: 20220815020014
Implantable Lead Implant Date: 20021126
Implantable Lead Implant Date: 20021126
Implantable Lead Location: 753859
Implantable Lead Location: 753860
Implantable Pulse Generator Implant Date: 20210412
Lead Channel Impedance Value: 410 Ohm
Lead Channel Impedance Value: 460 Ohm
Lead Channel Pacing Threshold Amplitude: 0.5 V
Lead Channel Pacing Threshold Amplitude: 1 V
Lead Channel Pacing Threshold Pulse Width: 0.5 ms
Lead Channel Pacing Threshold Pulse Width: 0.9 ms
Lead Channel Sensing Intrinsic Amplitude: 1.1 mV
Lead Channel Sensing Intrinsic Amplitude: 6 mV
Lead Channel Setting Pacing Amplitude: 2 V
Lead Channel Setting Pacing Amplitude: 2.5 V
Lead Channel Setting Pacing Pulse Width: 0.9 ms
Lead Channel Setting Sensing Sensitivity: 2 mV
Pulse Gen Model: 2272
Pulse Gen Serial Number: 3814236

## 2021-05-22 ENCOUNTER — Ambulatory Visit (INDEPENDENT_AMBULATORY_CARE_PROVIDER_SITE_OTHER): Payer: Medicare Other | Admitting: Family Medicine

## 2021-05-22 ENCOUNTER — Encounter: Payer: Self-pay | Admitting: Family Medicine

## 2021-05-22 ENCOUNTER — Other Ambulatory Visit: Payer: Self-pay

## 2021-05-22 VITALS — BP 150/78 | HR 63 | Resp 16 | Ht 71.0 in | Wt 167.8 lb

## 2021-05-22 DIAGNOSIS — Z0000 Encounter for general adult medical examination without abnormal findings: Secondary | ICD-10-CM | POA: Diagnosis not present

## 2021-05-22 DIAGNOSIS — E7849 Other hyperlipidemia: Secondary | ICD-10-CM

## 2021-05-22 DIAGNOSIS — I10 Essential (primary) hypertension: Secondary | ICD-10-CM

## 2021-05-22 NOTE — Patient Instructions (Addendum)
Flu vaccine in office the week of Sept 20 , pt is t have fasting labs this week  Please get fasting lipid, cmp and EGFr and TSH Set 20 or later in the week  No medication changes  F/U in early January  Continue to use the  walker all the time, so no more falls  Thanks for choosing [facility], we consider it a privelige to serve you.  

## 2021-05-25 ENCOUNTER — Encounter: Payer: Self-pay | Admitting: Family Medicine

## 2021-05-25 NOTE — Assessment & Plan Note (Signed)
Annual exam as documented.  Immunization needs are specifically addressed at this visit.  

## 2021-05-25 NOTE — Progress Notes (Signed)
    Candace Cervantes     MRN: [id number]      DOB: 06/10/1925  HPI: Patient is in for annual physical exam. Immunization is reviewed , and  updated if needed.   PE: BP (!) 150/78   Pulse 63   Resp 16   Ht '5\' 11"'$  (1.803 m)   Wt 167 lb 12.8 oz (76.1 kg)   SpO2 94%   BMI 23.40 kg/m   Pleasant  female, alert and oriented x 3, in no cardio-pulmonary distress. Afebrile. HEENT No facial trauma or asymetry. Sinuses non tender.  Extra occullar muscles intact.. External ears normal, . Neck: decreased ROM, no adenopathy,JVD or thyromegaly.No bruits.  Chest: Clear to ascultation bilaterally.No crackles or wheezes. Non tender to palpation    Cardiovascular system; Heart sounds normal,  S1 and  S2 ,no S3.    Abdomen: Soft, non tender, No guarding, tenderness or rebound.   Musculoskeletal exam: Decreased  ROM of spine, hips , shoulders and knees. No deformity ,swelling or crepitus noted. No muscle wasting or atrophy.   Neurologic: Cranial nerves 2 to 12 intact. Power, tone ,sensation  normal throughout. disturbance in gait. No tremor.  Skin: Intact, no ulceration, erythema , scaling or rash noted. Pigmentation normal throughout  Psych; Normal mood and affect. Judgement and concentration normal   Assessment & Plan:  No problem-specific Assessment & Plan notes found for this encounter.

## 2021-06-06 NOTE — Progress Notes (Signed)
Remote pacemaker transmission.   

## 2021-06-09 ENCOUNTER — Other Ambulatory Visit: Payer: Self-pay | Admitting: Family Medicine

## 2021-06-11 ENCOUNTER — Ambulatory Visit (INDEPENDENT_AMBULATORY_CARE_PROVIDER_SITE_OTHER): Payer: Medicare Other | Admitting: Internal Medicine

## 2021-06-11 ENCOUNTER — Other Ambulatory Visit: Payer: Self-pay

## 2021-06-11 VITALS — BP 122/68 | HR 63 | Ht 71.0 in | Wt 166.8 lb

## 2021-06-11 DIAGNOSIS — I10 Essential (primary) hypertension: Secondary | ICD-10-CM | POA: Diagnosis not present

## 2021-06-11 DIAGNOSIS — I495 Sick sinus syndrome: Secondary | ICD-10-CM

## 2021-06-11 NOTE — Patient Instructions (Signed)
Medication Instructions:  Your physician recommends that you continue on your current medications as directed. Please refer to the Current Medication list given to you today.  *If you need a refill on your cardiac medications before your next appointment, please call your pharmacy*   Lab Work: NONE   If you have labs (blood work) drawn today and your tests are completely normal, you will receive your results only by: . MyChart Message (if you have MyChart) OR . A paper copy in the mail If you have any lab test that is abnormal or we need to change your treatment, we will call you to review the results.   Testing/Procedures: NONE    Follow-Up: At [facility], you and your health needs are our priority.  As part of our continuing mission to provide you with exceptional heart care, we have created designated Provider Care Teams.  These Care Teams include your primary Cardiologist (physician) and Advanced Practice Providers (APPs -  Physician Assistants and Nurse Practitioners) who all work together to provide you with the care you need, when you need it.  We recommend signing up for the patient portal called "MyChart".  Sign up information is provided on this After Visit Summary.  MyChart is used to connect with patients for Virtual Visits (Telemedicine).  Patients are able to view lab/test results, encounter notes, upcoming appointments, etc.  Non-urgent messages can be sent to your provider as well.   To learn more about what you can do with MyChart, go to [email or web address].    Your next appointment:   1 year(s)  The format for your next appointment:   In Person  Provider:   Gregg Taylor, MD   Other Instructions Thank you for choosing [facility]!    

## 2021-06-11 NOTE — Progress Notes (Signed)
HPI  Candace Cervantes returns today for followup. She is a pleasant 87 yo woman with a h/o HTN, brief PAF, and sinus node dysfunction s/p PPM insertion. She had a gen change out 15 months ago. She feels well. She denies chest pain or sob. She has been bothered by headache. She is using a walker and despite this has had some falls. She is not on systemic anti-coagulation.   Current Outpatient Medications  Medication Sig Dispense Refill   acetaminophen (TYLENOL) 325 MG tablet Take 325 mg by mouth in the morning and at bedtime.      amLODipine (NORVASC) 2.5 MG tablet TAKE 1 TABLET BY MOUTH ONCE DAILY. TAKE WITH '5MG'$  TABLET. 30 tablet 0   amLODipine (NORVASC) 5 MG tablet TAKE 1 TABLET BY MOUTH ONCE A DAY. 30 tablet 0   citalopram (CELEXA) 10 MG tablet TAKE ONE TABLET BY MOUTH ONCE DAILY. 30 tablet 0   Cyanocobalamin (VITAMIN B-12 PO) Take 1 tablet by mouth daily.     dorzolamide-timolol (COSOPT) 22.3-6.8 MG/ML ophthalmic solution 1 drop 2 (two) times daily.     hydrochlorothiazide (MICROZIDE) 12.5 MG capsule TAKE 1 CAPSULE BY MOUTH ONCE DAILY. 30 capsule 0   isosorbide mononitrate (IMDUR) 30 MG 24 hr tablet TAKE 1/2 TABLET BY MOUTH ONCE DAILY. 15 tablet 0   latanoprost (XALATAN) 0.005 % ophthalmic solution 1 drop daily.     Multiple Vitamin (MULTIVITAMIN WITH MINERALS) TABS tablet Take 1 tablet by mouth daily.     nitroGLYCERIN (NITROSTAT) 0.4 MG SL tablet DISSOLVE 1 TABLET SUBLINGUALLY AS NEEDED FOR CHEST PAIN, MAY REPEAT EVERY 5 MINUTES. AFTER 3 TABLETS CALL 911. 25 tablet 0   omeprazole (PRILOSEC) 20 MG capsule TAKE ONE CAPSULE BY MOUTH ONCE DAILY. 30 capsule 0   OVER THE COUNTER MEDICATION 1 each daily. Super beets chews     pravastatin (PRAVACHOL) 40 MG tablet TAKE ONE TABLET BY MOUTH ONCE DAILY. 30 tablet 0   No current facility-administered medications for this visit.     Past Medical History:  Diagnosis Date   Anxiety disorder    Arthritis    CAD (coronary artery disease) 2005    Nonobstructive 40% RCA lesion and normal ejection fraction at cath    Chronic headache disorder 10/09/2017   GERD (gastroesophageal reflux disease)    Hyperlipidemia    Hypertension    Osteoporosis    Pacemaker 2002   Permanent Placement   Pacemaker 2007   Generator Changed    Sinoatrial node dysfunction (HCC)    SSS (sick sinus syndrome) ([place])    Zenker diverticulum    previously evaluated by Dr. Erik Obey in [id number].     ROS:   All systems reviewed and negative except as noted in the HPI.   Past Surgical History:  Procedure Laterality Date   CHOLECYSTECTOMY     COLONOSCOPY  2003   no polyps.   CYSTECTOMY     from back of the neck   ESOPHAGOGASTRODUODENOSCOPY  2008   Dr. Oneida Alar, difficulty passing scope through UES, incomplete fibrous ring at GEJ, hh, multiple benign gastri polyps   ESOPHAGOGASTRODUODENOSCOPY N/A 10/17/2013   ZENKER'S, STRICTURE: 10-12.8 MM,  NSAID GASTRITIS FG POLYPS   ESOPHAGOGASTRODUODENOSCOPY (EGD) WITH ESOPHAGEAL DILATION N/A 10/30/2013   SLF: 1. Zenkers diverticulum with a large opening at the cricopharyngeus 2. 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 Innumerable Fundic gland polyps 4. Mild NSAID gastritis   MALONEY DILATION N/A 10/17/2013   Procedure: MALONEY DILATION;  Surgeon: Marga Melnick  Fields, MD;  Location: [facility];  Service: Endoscopy;  Laterality: N/A;  with PEDS GASTROSCOPE   PACEMAKER INSERTION  2002   PACEMAKER PLACEMENT  2007   replacement    PPM GENERATOR CHANGEOUT N/A 01/15/2020   Procedure: PPM GENERATOR CHANGEOUT;  Surgeon: Evans Lance, MD;  Location: [facility];  Service: Cardiovascular;  Laterality: N/A;   SAVORY DILATION N/A 10/17/2013   Procedure: SAVORY DILATION;  Surgeon: Danie Binder, MD;  Location: [facility];  Service: Endoscopy;  Laterality: N/A;  with PEDS GASTROSCOPE   TOTAL ABDOMINAL HYSTERECTOMY  2002     Family History  Problem Relation Age of Onset   Heart disease Mother    Lung disease Sister    Alcohol  abuse Brother    Cancer Brother        possible prostate   Diabetes Sister    Colon cancer Other        sibling, age 81     Social History   Socioeconomic History   Marital status: Married    Spouse name: Not on file   Number of children: 2   Years of education: Not on file   Highest education level: 12th grade  Occupational History   Not on file  Tobacco Use   Smoking status: Never   Smokeless tobacco: Never  Vaping Use   Vaping Use: Never used  Substance and Sexual Activity   Alcohol use: No    Alcohol/week: 0.0 standard drinks   Drug use: No   Sexual activity: Not Currently  Other Topics Concern   Not on file  Social History Narrative   Not on file   Social Determinants of Health   Financial Resource Strain: Low Risk    Difficulty of Paying Living Expenses: Not very hard  Food Insecurity: No Food Insecurity   Worried About Charity fundraiser in the Last Year: Never true   [place] in the Last Year: Never true  Transportation Needs: No Transportation Needs   Lack of Transportation (Medical): No   Lack of Transportation (Non-Medical): No  Physical Activity: Inactive   Days of Exercise per Week: 0 days   Minutes of Exercise per Session: 0 min  Stress: No Stress Concern Present   Feeling of Stress : Only a little  Social Connections: Moderately Isolated   Frequency of Communication with Friends and Family: More than three times a week   Frequency of Social Gatherings with Friends and Family: Three times a week   Attends Religious Services: More than 4 times per year   Active Member of Clubs or Organizations: No   Attends Archivist Meetings: Never   Marital Status: Widowed  Human resources officer Violence: Not At Risk   Fear of Current or Ex-Partner: No   Emotionally Abused: No   Physically Abused: No   Sexually Abused: No     BP 122/68   Pulse 63   Ht '5\' 11"'$  (1.803 m)   Wt 166 lb 12.8 oz (75.7 kg)   SpO2 97%   BMI 23.26 kg/m    Physical Exam:  Well appearing NAD HEENT: Unremarkable Neck:  No JVD, no thyromegally Lymphatics:  No adenopathy Back:  No CVA tenderness Lungs:  Clear HEART:  Regular rate rhythm, no murmurs, no rubs, no clicks Abd:  soft, positive bowel sounds, no organomegally, no rebound, no guarding Ext:  2 plus pulses, no edema, no cyanosis, no clubbing Skin:  No rashes no  nodules Neuro:  CN II through XII intact, motor grossly intact  EKG - nsr with atrial pacing  DEVICE  Normal device function.  See PaceArt for details.   Assess/Plan:  1. Sinus node dysfunction - she is atrial pacing and asymptomatic.  2.  PPM - her [employer] Jude DDD PM is working normally.  3. PAF - she has had an hour of atrial fib. We discussed systemic anti-coagulation. We will hold off on this for now as the risk benefit does not warrant as she has had falls and will likely to continue to do so. 4. HTN - her bp is well controlled.    Mikle Bosworth.D.

## 2021-06-26 ENCOUNTER — Other Ambulatory Visit: Payer: Self-pay

## 2021-06-26 ENCOUNTER — Ambulatory Visit (INDEPENDENT_AMBULATORY_CARE_PROVIDER_SITE_OTHER): Payer: Medicare Other

## 2021-06-26 DIAGNOSIS — E7849 Other hyperlipidemia: Secondary | ICD-10-CM | POA: Diagnosis not present

## 2021-06-26 DIAGNOSIS — Z23 Encounter for immunization: Secondary | ICD-10-CM

## 2021-06-26 DIAGNOSIS — I10 Essential (primary) hypertension: Secondary | ICD-10-CM | POA: Diagnosis not present

## 2021-06-27 LAB — LIPID PANEL
Chol/HDL Ratio: 2.3 ratio (ref 0.0–4.4)
Cholesterol, Total: 184 mg/dL (ref 100–199)
HDL: 81 mg/dL (ref >39)
LDL Chol Calc (NIH): 91 mg/dL (ref 0–99)
Triglycerides: 64 mg/dL (ref 0–149)
VLDL Cholesterol Cal: 12 mg/dL (ref 5–40)

## 2021-06-27 LAB — CMP14+EGFR
ALT: 21 IU/L (ref 0–32)
AST: 24 IU/L (ref 0–40)
Albumin/Globulin Ratio: 1.5 (ref 1.2–2.2)
Albumin: 4.3 g/dL (ref 3.5–4.6)
Alkaline Phosphatase: 72 IU/L (ref 44–121)
BUN/Creatinine Ratio: 19 (ref 12–28)
BUN: 18 mg/dL (ref 10–36)
Bilirubin Total: 0.4 mg/dL (ref 0.0–1.2)
CO2: 25 mmol/L (ref 20–29)
Calcium: 9.7 mg/dL (ref 8.7–10.3)
Chloride: 103 mmol/L (ref 96–106)
Creatinine, Ser: 0.96 mg/dL (ref 0.57–1.00)
Globulin, Total: 2.8 g/dL (ref 1.5–4.5)
Glucose: 101 mg/dL — ABNORMAL HIGH (ref 65–99)
Potassium: 3.8 mmol/L (ref 3.5–5.2)
Sodium: 141 mmol/L (ref 134–144)
Total Protein: 7.1 g/dL (ref 6.0–8.5)
eGFR: 55 mL/min/{1.73_m2} — ABNORMAL LOW (ref >59)

## 2021-06-27 LAB — TSH: TSH: 2.45 u[IU]/mL (ref 0.450–4.500)

## 2021-07-12 ENCOUNTER — Other Ambulatory Visit: Payer: Self-pay | Admitting: Family Medicine

## 2021-07-25 ENCOUNTER — Encounter: Payer: Self-pay | Admitting: *Deleted

## 2021-08-11 ENCOUNTER — Other Ambulatory Visit: Payer: Self-pay | Admitting: Family Medicine

## 2021-08-18 ENCOUNTER — Ambulatory Visit (INDEPENDENT_AMBULATORY_CARE_PROVIDER_SITE_OTHER): Payer: Medicare Other

## 2021-08-18 DIAGNOSIS — I495 Sick sinus syndrome: Secondary | ICD-10-CM | POA: Diagnosis not present

## 2021-08-18 LAB — CUP PACEART REMOTE DEVICE CHECK
Battery Remaining Longevity: 95 mo
Battery Remaining Percentage: 89 %
Battery Voltage: 3.01 V
Brady Statistic AP VP Percent: 1 %
Brady Statistic AP VS Percent: 69 %
Brady Statistic AS VP Percent: 1 %
Brady Statistic AS VS Percent: 30 %
Brady Statistic RA Percent Paced: 68 %
Brady Statistic RV Percent Paced: 1 %
Date Time Interrogation Session: 20221114021601
Implantable Lead Implant Date: 20021126
Implantable Lead Implant Date: 20021126
Implantable Lead Location: 753859
Implantable Lead Location: 753860
Implantable Pulse Generator Implant Date: 20210412
Lead Channel Impedance Value: 410 Ohm
Lead Channel Impedance Value: 460 Ohm
Lead Channel Pacing Threshold Amplitude: 0.5 V
Lead Channel Pacing Threshold Amplitude: 1 V
Lead Channel Pacing Threshold Pulse Width: 0.5 ms
Lead Channel Pacing Threshold Pulse Width: 0.9 ms
Lead Channel Sensing Intrinsic Amplitude: 1.2 mV
Lead Channel Sensing Intrinsic Amplitude: 7.2 mV
Lead Channel Setting Pacing Amplitude: 2 V
Lead Channel Setting Pacing Amplitude: 2.5 V
Lead Channel Setting Pacing Pulse Width: 0.9 ms
Lead Channel Setting Sensing Sensitivity: 2 mV
Pulse Gen Model: 2272
Pulse Gen Serial Number: 3814236

## 2021-08-26 NOTE — Progress Notes (Signed)
Remote pacemaker transmission.   

## 2021-09-01 ENCOUNTER — Other Ambulatory Visit: Payer: Self-pay

## 2021-09-01 ENCOUNTER — Ambulatory Visit (INDEPENDENT_AMBULATORY_CARE_PROVIDER_SITE_OTHER): Payer: Medicare Other

## 2021-09-01 DIAGNOSIS — Z0000 Encounter for general adult medical examination without abnormal findings: Secondary | ICD-10-CM

## 2021-09-01 NOTE — Patient Instructions (Addendum)
Candace Cervantes , Thank you for taking time to come for your Medicare Wellness Visit. I appreciate your ongoing commitment to your health goals. Please review the following plan we discussed and let me know if I can assist you in the future.     2nd shingrix vaccine due Dec 2022 (This is at your preferred pharmacy)  These are the goals we discussed:  Goals      Activity and Exercise Increased     Evidence-based guidance:  Review current exercise levels.  Assess patient perspective on exercise or activity level, barriers to increasing activity, motivation and readiness for change.  Recommend or set healthy exercise goal based on individual tolerance.  Encourage small steps toward making change in amount of exercise or activity.  Urge reduction of sedentary activities or screen time.  Promote group activities within the community or with family or support person.  Consider referral to rehabiliation therapist for assessment and exercise/activity plan.   Notes:      Exercise 3x per week (30 min per time)     Recommend starting a routine exercise program at least 3 days a week for 30-45 minutes at a time as tolerated.       Prevent falls     Will do the chair exercises to help strengthen her legs and take her time and always use walker when ambulating         This is a list of the screening recommended for you and due dates:  Health Maintenance  Topic Date Due   COVID-19 Vaccine (4 - Booster for Moderna series) 10/03/2020   Tetanus Vaccine  08/17/2021   Zoster (Shingles) Vaccine (2 of 2) 09/19/2021   Pneumonia Vaccine  Completed   Flu Shot  Completed   DEXA scan (bone density measurement)  Completed   HPV Vaccine  Aged Out         Health Maintenance, Female Adopting a healthy lifestyle and getting preventive care are important in promoting health and wellness. Ask your health care provider about: The right schedule for you to have regular tests and exams. Things you can do on  your own to prevent diseases and keep yourself healthy. What should I know about diet, weight, and exercise? Eat a healthy diet  Eat a diet that includes plenty of vegetables, fruits, low-fat dairy products, and lean protein. Do not eat a lot of foods that are high in solid fats, added sugars, or sodium. Maintain a healthy weight Body mass index (BMI) is used to identify weight problems. It estimates body fat based on height and weight. Your health care provider can help determine your BMI and help you achieve or maintain a healthy weight. Get regular exercise Get regular exercise. This is one of the most important things you can do for your health. Most adults should: Exercise for at least 150 minutes each week. The exercise should increase your heart rate and make you sweat (moderate-intensity exercise). Do strengthening exercises at least twice a week. This is in addition to the moderate-intensity exercise. Spend less time sitting. Even light physical activity can be beneficial. Watch cholesterol and blood lipids Have your blood tested for lipids and cholesterol at 85 years of age, then have this test every 5 years. Have your cholesterol levels checked more often if: Your lipid or cholesterol levels are high. You are older than 85 years of age. You are at high risk for heart disease. What should I know about cancer screening? Depending on your health  history and family history, you may need to have cancer screening at various ages. This may include screening for: Breast cancer. Cervical cancer. Colorectal cancer. Skin cancer. Lung cancer. What should I know about heart disease, diabetes, and high blood pressure? Blood pressure and heart disease High blood pressure causes heart disease and increases the risk of stroke. This is more likely to develop in people who have high blood pressure readings or are overweight. Have your blood pressure checked: Every 3-5 years if you are 44-34  years of age. Every year if you are 52 years old or older. Diabetes Have regular diabetes screenings. This checks your fasting blood sugar level. Have the screening done: Once every three years after age 67 if you are at a normal weight and have a low risk for diabetes. More often and at a younger age if you are overweight or have a high risk for diabetes. What should I know about preventing infection? Hepatitis B If you have a higher risk for hepatitis B, you should be screened for this virus. Talk with your health care provider to find out if you are at risk for hepatitis B infection. Hepatitis C Testing is recommended for: Everyone born from [birth year] through 1963. Anyone with known risk factors for hepatitis C. Sexually transmitted infections (STIs) Get screened for STIs, including gonorrhea and chlamydia, if: You are sexually active and are younger than 85 years of age. You are older than 85 years of age and your health care provider tells you that you are at risk for this type of infection. Your sexual activity has changed since you were last screened, and you are at increased risk for chlamydia or gonorrhea. Ask your health care provider if you are at risk. Ask your health care provider about whether you are at high risk for HIV. Your health care provider may recommend a prescription medicine to help prevent HIV infection. If you choose to take medicine to prevent HIV, you should first get tested for HIV. You should then be tested every 3 months for as long as you are taking the medicine. Pregnancy If you are about to stop having your period (premenopausal) and you may become pregnant, seek counseling before you get pregnant. Take 400 to 800 micrograms (mcg) of folic acid every day if you become pregnant. Ask for birth control (contraception) if you want to prevent pregnancy. Osteoporosis and menopause Osteoporosis is a disease in which the bones lose minerals and strength with aging. This  can result in bone fractures. If you are 57 years old or older, or if you are at risk for osteoporosis and fractures, ask your health care provider if you should: Be screened for bone loss. Take a calcium or vitamin D supplement to lower your risk of fractures. Be given hormone replacement therapy (HRT) to treat symptoms of menopause. Follow these instructions at home: Alcohol use Do not drink alcohol if: Your health care provider tells you not to drink. You are pregnant, may be pregnant, or are planning to become pregnant. If you drink alcohol: Limit how much you have to: 0-1 drink a day. Know how much alcohol is in your drink. In the [place] one drink equals one 12 oz bottle of beer (355 mL), one 5 oz glass of wine (148 mL), or one 1 oz glass of hard liquor (44 mL). Lifestyle Do not use any products that contain nicotine or tobacco. These products include cigarettes, chewing tobacco, and vaping devices, such as e-cigarettes. If you need  help quitting, ask your health care provider. Do not use street drugs. Do not share needles. Ask your health care provider for help if you need support or information about quitting drugs. General instructions Schedule regular health, dental, and eye exams. Stay current with your vaccines. Tell your health care provider if: You often feel depressed. You have ever been abused or do not feel safe at home. Summary Adopting a healthy lifestyle and getting preventive care are important in promoting health and wellness. Follow your health care provider's instructions about healthy diet, exercising, and getting tested or screened for diseases. Follow your health care provider's instructions on monitoring your cholesterol and blood pressure. This information is not intended to replace advice given to you by your health care provider. Make sure you discuss any questions you have with your health care provider. Document Revised: 02/10/2021 Document Reviewed:  02/10/2021 Elsevier Patient Education  [place].

## 2021-09-01 NOTE — Progress Notes (Signed)
I connected with  Candace Cervantes on 09/01/21 by a video and audio enabled telemedicine application and verified that I am speaking with the correct person using two identifiers.  Patient Location: Home  Provider Location: Office/Clinic  I discussed the limitations of evaluation and management by telemedicine. The patient expressed understanding and agreed to proceed.    Subjective:   Candace Cervantes is a 87 y.o. female who presents for Medicare Annual (Subsequent) preventive examination.  Review of Systems     Cardiac Risk Factors include: advanced age (>76men, >69 women);dyslipidemia;sedentary lifestyle     Objective:    There were no vitals filed for this visit. There is no height or weight on file to calculate BMI.  Advanced Directives 09/01/2021 09/09/2020 07/30/2020 01/15/2020 10/27/2018 07/25/2018 02/14/2018  Does Patient Have a Medical Advance Directive? No Yes No Yes No No No  Type of Advance Directive - Living will;Healthcare Power of [facility]  Does patient want to make changes to medical advance directive? - - - - - - -  Copy of [place] in Chart? - No - copy requested - - - - -  Would patient like information on creating a medical advance directive? Yes (ED - Information included in AVS) - No - Patient declined - No - Patient declined Yes (ED - Information included in AVS) -  Pre-existing out of facility DNR order (yellow form or pink MOST form) - - - - - - -    Current Medications (verified) Outpatient Encounter Medications as of 09/01/2021  Medication Sig   acetaminophen (TYLENOL) 325 MG tablet Take 325 mg by mouth in the morning and at bedtime.    amLODipine (NORVASC) 2.5 MG tablet TAKE 1 TABLET BY MOUTH ONCE DAILY. TAKE WITH 5MG  TABLET.   amLODipine (NORVASC) 5 MG tablet TAKE 1 TABLET BY MOUTH ONCE A DAY.   citalopram (CELEXA) 10 MG tablet TAKE ONE TABLET BY MOUTH ONCE DAILY.   Cyanocobalamin (VITAMIN B-12  PO) Take 1 tablet by mouth daily.   dorzolamide-timolol (COSOPT) 22.3-6.8 MG/ML ophthalmic solution 1 drop 2 (two) times daily.   hydrochlorothiazide (MICROZIDE) 12.5 MG capsule TAKE 1 CAPSULE BY MOUTH ONCE DAILY.   isosorbide mononitrate (IMDUR) 30 MG 24 hr tablet TAKE 1/2 TABLET BY MOUTH ONCE DAILY.   latanoprost (XALATAN) 0.005 % ophthalmic solution 1 drop daily.   Multiple Vitamin (MULTIVITAMIN WITH MINERALS) TABS tablet Take 1 tablet by mouth daily.   nitroGLYCERIN (NITROSTAT) 0.4 MG SL tablet DISSOLVE 1 TABLET SUBLINGUALLY AS NEEDED FOR CHEST PAIN, MAY REPEAT EVERY 5 MINUTES. AFTER 3 TABLETS CALL 911.   omeprazole (PRILOSEC) 20 MG capsule TAKE ONE CAPSULE BY MOUTH ONCE DAILY.   OVER THE COUNTER MEDICATION 1 each daily. Super beets chews   pravastatin (PRAVACHOL) 40 MG tablet TAKE ONE TABLET BY MOUTH ONCE DAILY.   No facility-administered encounter medications on file as of 09/01/2021.    Allergies (verified) Patient has no known allergies.   History: Past Medical History:  Diagnosis Date   Anxiety disorder    Arthritis    CAD (coronary artery disease) 2005   Nonobstructive 40% RCA lesion and normal ejection fraction at cath    Chronic headache disorder 10/09/2017   GERD (gastroesophageal reflux disease)    Hyperlipidemia    Hypertension    Osteoporosis    Pacemaker 2002   Permanent Placement   Pacemaker 2007   Generator Changed    Sinoatrial node  dysfunction (HCC)    SSS (sick sinus syndrome) ([place])    Zenker diverticulum    previously evaluated by Dr. Erik Obey in 0981.    Past Surgical History:  Procedure Laterality Date   CHOLECYSTECTOMY     COLONOSCOPY  2003   no polyps.   CYSTECTOMY     from back of the neck   ESOPHAGOGASTRODUODENOSCOPY  2008   Dr. Oneida Alar, difficulty passing scope through UES, incomplete fibrous ring at GEJ, hh, multiple benign gastri polyps   ESOPHAGOGASTRODUODENOSCOPY N/A 10/17/2013   ZENKER'S, STRICTURE: 10-12.8 MM,  NSAID GASTRITIS FG POLYPS    ESOPHAGOGASTRODUODENOSCOPY (EGD) WITH ESOPHAGEAL DILATION N/A 10/30/2013   SLF: 1. Zenkers diverticulum with a large opening at the cricopharyngeus 2. 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 Innumerable Fundic gland polyps 4. Mild NSAID gastritis   MALONEY DILATION N/A 10/17/2013   Procedure: MALONEY DILATION;  Surgeon: Danie Binder, MD;  Location: [facility];  Service: Endoscopy;  Laterality: N/A;  with PEDS GASTROSCOPE   PACEMAKER INSERTION  2002   PACEMAKER PLACEMENT  2007   replacement    PPM GENERATOR CHANGEOUT N/A 01/15/2020   Procedure: PPM GENERATOR CHANGEOUT;  Surgeon: Evans Lance, MD;  Location: [facility];  Service: Cardiovascular;  Laterality: N/A;   SAVORY DILATION N/A 10/17/2013   Procedure: SAVORY DILATION;  Surgeon: Danie Binder, MD;  Location: [facility];  Service: Endoscopy;  Laterality: N/A;  with PEDS GASTROSCOPE   TOTAL ABDOMINAL HYSTERECTOMY  2002   Family History  Problem Relation Age of Onset   Heart disease Mother    Lung disease Sister    Alcohol abuse Brother    Cancer Brother        possible prostate   Diabetes Sister    Colon cancer Other        sibling, age 62   Social History   Socioeconomic History   Marital status: Married    Spouse name: Not on file   Number of children: 2   Years of education: Not on file   Highest education level: 12th grade  Occupational History   Not on file  Tobacco Use   Smoking status: Never   Smokeless tobacco: Never  Vaping Use   Vaping Use: Never used  Substance and Sexual Activity   Alcohol use: No    Alcohol/week: 0.0 standard drinks   Drug use: No   Sexual activity: Not Currently  Other Topics Concern   Not on file  Social History Narrative   Not on file   Social Determinants of Health   Financial Resource Strain: Low Risk    Difficulty of Paying Living Expenses: Not very hard  Food Insecurity: No Food Insecurity   Worried About Charity fundraiser in the Last Year: Never true   Ran  Out of Food in the Last Year: Never true  Transportation Needs: Not on file  Physical Activity: Insufficiently Active   Days of Exercise per Week: 3 days   Minutes of Exercise per Session: 10 min  Stress: No Stress Concern Present   Feeling of Stress : Only a little  Social Connections: Moderately Isolated   Frequency of Communication with Friends and Family: Three times a week   Frequency of Social Gatherings with Friends and Family: Twice a week   Attends Religious Services: More than 4 times per year   Active Member of [employer] or Organizations: No   Attends Archivist Meetings: Never   Marital Status: Widowed  Tobacco Counseling Counseling given: Not Answered   Clinical Intake:  Pre-visit preparation completed: Yes  Pain : No/denies pain     Nutritional Status: BMI of 19-24  Normal Diabetes: No  How often do you need to have someone help you when you read instructions, pamphlets, or other written materials from your doctor or pharmacy?: 2 - Rarely What is the last grade level you completed in school?: 12th  Diabetic? no  Interpreter Needed?: No      Activities of Daily Living In your present state of health, do you have any difficulty performing the following activities: 09/01/2021  Hearing? N  Vision? N  Difficulty concentrating or making decisions? N  Walking or climbing stairs? Y  Dressing or bathing? N  Doing errands, shopping? N  Preparing Food and eating ? N  Using the Toilet? N  In the past six months, have you accidently leaked urine? N  Do you have problems with loss of bowel control? N  Managing your Medications? N  Managing your Finances? N  Housekeeping or managing your Housekeeping? N  Some recent data might be hidden    Patient Care Team: Fayrene Helper, MD as PCP - General Evans Lance, MD as PCP - Cardiology (Cardiology) Lendon Colonel, NP as Nurse Practitioner (Nurse Practitioner) Lattie Haw, Cristopher Estimable, MD  (Inactive) as Attending Physician (Cardiology) Danie Binder, MD (Inactive) as Consulting Physician (Gastroenterology) Rutherford Guys, MD as Consulting Physician (Ophthalmology) Allyn Kenner, MD as Consulting Physician (Dermatology)  Indicate any recent Medical Services you may have received from other than [facility] providers in the past year (date may be approximate).     Assessment:   This is a routine wellness examination for Ariely.  Hearing/Vision screen No results found.  Dietary issues and exercise activities discussed: Current Exercise Habits: The patient does not participate in regular exercise at present, Exercise limited by: None identified   Goals Addressed               This Visit's Progress     Activity and Exercise Increased   Not on track     Evidence-based guidance:  Review current exercise levels.  Assess patient perspective on exercise or activity level, barriers to increasing activity, motivation and readiness for change.  Recommend or set healthy exercise goal based on individual tolerance.  Encourage small steps toward making change in amount of exercise or activity.  Urge reduction of sedentary activities or screen time.  Promote group activities within the community or with family or support person.  Consider referral to rehabiliation therapist for assessment and exercise/activity plan.   Notes:       Exercise 3x per week (30 min per time)   Not on track     Recommend starting a routine exercise program at least 3 days a week for 30-45 minutes at a time as tolerated.        COMPLETED: Patient Stated (pt-stated)        Will do more chair exercises during free time      Prevent falls   On track     Will do the chair exercises to help strengthen her legs and take her time and always use walker when ambulating        Depression Screen PHQ 2/9 Scores 09/01/2021 12/12/2020 08/15/2020 07/30/2020 07/30/2020 06/12/2020 05/09/2020  PHQ - 2 Score 0 0 1 1 0 0 1   PHQ- 9 Score - - - - - - -    Fall  Risk Fall Risk  09/01/2021 05/22/2021 12/12/2020 09/11/2020 08/15/2020  Falls in the past year? 1 1 0 1 1  Number falls in past yr: 0 1 0 0 1  Injury with Fall? 0 0 0 0 1  Risk for fall due to : - - No Fall Risks - -  Follow up - - Falls evaluation completed - -    FALL RISK PREVENTION PERTAINING TO THE HOME:  Any stairs in or around the home? No  If so, are there any without handrails? no Home free of loose throw rugs in walkways, pet beds, electrical cords, etc? Yes  Adequate lighting in your home to reduce risk of falls? Yes   ASSISTIVE DEVICES UTILIZED TO PREVENT FALLS:  Life alert? Yes  Use of a cane, walker or w/c? Yes  Grab bars in the bathroom? Yes  Shower chair or bench in shower? Yes  Elevated toilet seat or a handicapped toilet? Yes    Cognitive Function: MMSE - Mini Mental State Exam 01/25/2018  Orientation to time 5  Orientation to Place 5  Registration 3  Attention/ Calculation 5  Recall 1  Language- name 2 objects 2  Language- repeat 1  Language- follow 3 step command 3  Language- read & follow direction 1  Write a sentence 1  Copy design 0  Total score 27     6CIT Screen 09/01/2021 07/30/2020 07/27/2019 07/25/2018 07/12/2017  What Year? 0 points 4 points 0 points 0 points 0 points  What month? 0 points 0 points 0 points 0 points 0 points  What time? 0 points 0 points 0 points 0 points 0 points  Count back from 20 0 points 0 points 0 points 0 points 0 points  Months in reverse 4 points 4 points 0 points 2 points 0 points  Repeat phrase 8 points 2 points 0 points 4 points 2 points  Total Score 12 10 0 6 2    Immunizations Immunization History  Administered Date(s) Administered   Fluad Quad(high Dose 65+) 07/06/2019, 06/12/2020, 06/26/2021   H1N1 09/20/2008   Influenza Split 08/11/2011, 06/22/2012   Influenza Whole 06/30/2007, 07/08/2009, 07/09/2010   Influenza,inj,Quad PF,6+ Mos 06/20/2013, 05/28/2014,  06/13/2015, 06/02/2016, 06/14/2017, 06/09/2018   Moderna Sars-Covid-2 Vaccination 10/23/2019, 11/20/2019, 08/08/2020   Pneumococcal Conjugate-13 04/03/2014   Pneumococcal Polysaccharide-23 09/04/2003   Td 02/20/2004   Tdap 08/18/2011   Zoster Recombinat (Shingrix) 07/25/2021   Zoster, Live 05/24/2008    TDAP status: Due, Education has been provided regarding the importance of this vaccine. Advised may receive this vaccine at local pharmacy or Health Dept. Aware to provide a copy of the vaccination record if obtained from local pharmacy or Health Dept. Verbalized acceptance and understanding.  Flu Vaccine status: Up to date  Pneumococcal vaccine status: Up to date  Covid-19 vaccine status: Completed vaccines  Qualifies for Shingles Vaccine? Yes   Zostavax completed Yes   Shingrix Completed?: Yes  next due 09/2021  Screening Tests Health Maintenance  Topic Date Due   COVID-19 Vaccine (4 - Booster for Moderna series) 10/03/2020   TETANUS/TDAP  08/17/2021   Zoster Vaccines- Shingrix (2 of 2) 09/19/2021   Pneumonia Vaccine 13+ Years old  Completed   INFLUENZA VACCINE  Completed   DEXA SCAN  Completed   HPV VACCINES  Aged Out    Health Maintenance  Health Maintenance Due  Topic Date Due   COVID-19 Vaccine (4 - Booster for Moderna series) 10/03/2020   TETANUS/TDAP  08/17/2021    Colorectal  cancer screening: No longer required.   Mammogram status: No longer required due to age.  Bone Density status: Completed yes. Results reflect: Bone density results: NORMAL. Repeat every 0 years.  Lung Cancer Screening: (Low Dose CT Chest recommended if Age 69-80 years, 30 pack-year currently smoking OR have quit w/in 15years.) does not qualify.   Lung Cancer Screening Referral: no  Additional Screening:  Hepatitis C Screening: does qualify; Completed no  Vision Screening: Recommended annual ophthalmology exams for early detection of glaucoma and other disorders of the eye. Is the  patient up to date with their annual eye exam?  Yes  Who is the provider or what is the name of the office in which the patient attends annual eye exams?  If pt is not established with a provider, would they like to be referred to a provider to establish care? No .   Dental Screening: Recommended annual dental exams for proper oral hygiene  Community Resource Referral / Chronic Care Management: CRR required this visit?  No   CCM required this visit?  No      Plan:     I have personally reviewed and noted the following in the patient's chart:   Medical and social history Use of alcohol, tobacco or illicit drugs  Current medications and supplements including opioid prescriptions.  Functional ability and status Nutritional status Physical activity Advanced directives List of other physicians Hospitalizations, surgeries, and ER visits in previous 12 months Vitals Screenings to include cognitive, depression, and falls Referrals and appointments  In addition, I have reviewed and discussed with patient certain preventive protocols, quality metrics, and best practice recommendations. A written personalized care plan for preventive services as well as general preventive health recommendations were provided to patient.     Kate Sable, LPN, LPN   [id number]/81/[id number]   Nurse Notes:  Ms. Wiltsie , Thank you for taking time to come for your Medicare Wellness Visit. I appreciate your ongoing commitment to your health goals. Please review the following plan we discussed and let me know if I can assist you in the future.   These are the goals we discussed:  Goals      Activity and Exercise Increased     Evidence-based guidance:  Review current exercise levels.  Assess patient perspective on exercise or activity level, barriers to increasing activity, motivation and readiness for change.  Recommend or set healthy exercise goal based on individual tolerance.  Encourage small steps toward making  change in amount of exercise or activity.  Urge reduction of sedentary activities or screen time.  Promote group activities within the community or with family or support person.  Consider referral to rehabiliation therapist for assessment and exercise/activity plan.   Notes:      Exercise 3x per week (30 min per time)     Recommend starting a routine exercise program at least 3 days a week for 30-45 minutes at a time as tolerated.       Prevent falls     Will do the chair exercises to help strengthen her legs and take her time and always use walker when ambulating         This is a list of the screening recommended for you and due dates:  Health Maintenance  Topic Date Due   COVID-19 Vaccine (4 - Booster for Moderna series) 10/03/2020   Tetanus Vaccine  08/17/2021   Zoster (Shingles) Vaccine (2 of 2) 09/19/2021   Pneumonia Vaccine  Completed   Flu  Shot  Completed   DEXA scan (bone density measurement)  Completed   HPV Vaccine  Aged Out

## 2021-09-11 ENCOUNTER — Other Ambulatory Visit: Payer: Self-pay | Admitting: Family Medicine

## 2021-10-08 ENCOUNTER — Other Ambulatory Visit: Payer: Self-pay | Admitting: Nurse Practitioner

## 2021-10-16 ENCOUNTER — Other Ambulatory Visit: Payer: Self-pay

## 2021-10-16 ENCOUNTER — Ambulatory Visit (INDEPENDENT_AMBULATORY_CARE_PROVIDER_SITE_OTHER): Payer: Medicare Other | Admitting: Family Medicine

## 2021-10-16 ENCOUNTER — Encounter: Payer: Self-pay | Admitting: Family Medicine

## 2021-10-16 VITALS — BP 164/64 | HR 61 | Resp 16 | Ht 71.0 in | Wt 164.0 lb

## 2021-10-16 DIAGNOSIS — I10 Essential (primary) hypertension: Secondary | ICD-10-CM | POA: Diagnosis not present

## 2021-10-16 DIAGNOSIS — B354 Tinea corporis: Secondary | ICD-10-CM | POA: Diagnosis not present

## 2021-10-16 DIAGNOSIS — E7849 Other hyperlipidemia: Secondary | ICD-10-CM

## 2021-10-16 DIAGNOSIS — K120 Recurrent oral aphthae: Secondary | ICD-10-CM

## 2021-10-16 DIAGNOSIS — E559 Vitamin D deficiency, unspecified: Secondary | ICD-10-CM | POA: Diagnosis not present

## 2021-10-16 DIAGNOSIS — R2681 Unsteadiness on feet: Secondary | ICD-10-CM | POA: Diagnosis not present

## 2021-10-16 DIAGNOSIS — F411 Generalized anxiety disorder: Secondary | ICD-10-CM | POA: Diagnosis not present

## 2021-10-16 DIAGNOSIS — I4891 Unspecified atrial fibrillation: Secondary | ICD-10-CM

## 2021-10-16 MED ORDER — CLOTRIMAZOLE-BETAMETHASONE 1-0.05 % EX CREA: 1.0000 "application " | TOPICAL_CREAM | Freq: Two times a day (BID) | CUTANEOUS | 1 refills | Status: DC

## 2021-10-16 NOTE — Patient Instructions (Addendum)
F/U end May, call if you need me before  Stay on current medications  Use oragel on your gum till the sore heals and keep dentures out as much as possible  Please be careful not to fall, and if you do , get help before atempting to stand on your own  Cream prescribed for rash on back  Fasting lipid, cmp and eGFr, vit d and CBC 7days before next visit  Thanks for choosing [facility], we consider it a privelige to serve you.

## 2021-10-20 ENCOUNTER — Encounter: Payer: Self-pay | Admitting: Family Medicine

## 2021-10-20 DIAGNOSIS — B354 Tinea corporis: Secondary | ICD-10-CM | POA: Insufficient documentation

## 2021-10-20 DIAGNOSIS — K120 Recurrent oral aphthae: Secondary | ICD-10-CM | POA: Insufficient documentation

## 2021-10-20 NOTE — Assessment & Plan Note (Signed)
Controlled, no change in medication  

## 2021-10-20 NOTE — Assessment & Plan Note (Signed)
Recent fall reported by patient, home safety and use of alarm necklace is stressed and discussed

## 2021-10-20 NOTE — Assessment & Plan Note (Signed)
Advised to keep dentures off till healed, topical oragel recommended

## 2021-10-20 NOTE — Progress Notes (Signed)
° °  Candace Cervantes     MRN: [id number]      DOB: 1924-11-03   HPI Candace Cervantes is here for follow up and re-evaluation of chronic medical conditions, medication management and review of any available recent lab and radiology data.  .   The PT denies any adverse reactions to current medications since the last visit.  C/o increased weakness C/o itchy bumps on back of neck x weeks C/o sore ches after moving object   ROS Denies recent fever or chills. Denies sinus pressure, nasal congestion, ear pain or sore throat. Denies chest congestion, productive cough or wheezing. Denies palpitations and leg swelling Denies abdominal pain, nausea, vomiting,diarrhea or constipation.   Denies dysuria, frequency, hesitancy  Chronic joint pain, stiffness and reduced mobility, uses assistive device Denies headaches, seizures, numbness, or tingling. Denies depression, anxiety or insomnia.  PE  BP (!) 164/64    Pulse 61    Resp 16    Ht 5\' 11"  (1.803 m)    Wt 164 lb (74.4 kg)    SpO2 94%    BMI 22.87 kg/m   Patient alert  and in no cardiopulmonary distress.  HEENT: No facial asymmetry, EOMI,     Neck decreased ROM .apthous ulcer on gum anterior  Chest: Clear to auscultation bilaterally.  CVS: S1, S2 no murmurs, no S3.Regular rate.  ABD: Soft non tender.   Ext: No edema  MS: decreased  ROM spine, shoulders, hips and knees.  Skin: Intact, fungal  rash noted.on posterior neck and upper back  Psych: Good eye contact, normal affect. Memory impaired not anxious or depressed appearing.  CNS: CN 2-12 intact, power,  normal throughout.no focal deficits noted.   Assessment & Plan  Essential hypertension DASH diet and commitment to daily physical activity for a minimum of 30 minutes discussed and encouraged, as a part of hypertension management. The importance of attaining a healthy weight is also discussed. Elevated at visit, has not had medication today yet, generally controlled, no med  change  BP/Weight 10/16/2021 06/11/2021 05/22/2021 03/11/2021 12/12/2020 09/11/2020 16/12/8464  Systolic BP 599 357 017 793 - 903 009  Diastolic BP 64 68 78 75 - 80 72  Wt. (Lbs) 164 166.8 167.8 - 162 166 162  BMI 22.87 23.26 23.4 - 22.59 23.15 22.59       GAD (generalized anxiety disorder) Controlled, no change in medication   Atrial fibrillation Rate controlled   Hyperlipemia Hyperlipidemia:Low fat diet discussed and encouraged.   Lipid Panel  Lab Results  Component Value Date   CHOL 184 06/26/2021   HDL 81 06/26/2021   LDLCALC 91 06/26/2021   TRIG 64 06/26/2021   CHOLHDL 2.3 06/26/2021       Unsteady gait Recent fall reported by patient, home safety and use of alarm necklace is stressed and discussed  Aphthous ulcer of mouth Advised to keep dentures off till healed, topical oragel recommended  Tinea corporis Topical antifungal prescribed

## 2021-10-20 NOTE — Assessment & Plan Note (Addendum)
DASH diet and commitment to daily physical activity for a minimum of 30 minutes discussed and encouraged, as a part of hypertension management. The importance of attaining a healthy weight is also discussed. Elevated at visit, has not had medication today yet, generally controlled, no med change  BP/Weight 10/16/2021 06/11/2021 05/22/2021 03/11/2021 12/12/2020 09/11/2020 69/02/721  Systolic BP 575 051 833 582 - 518 984  Diastolic BP 64 68 78 75 - 80 72  Wt. (Lbs) 164 166.8 167.8 - 162 166 162  BMI 22.87 23.26 23.4 - 22.59 23.15 22.59

## 2021-10-20 NOTE — Assessment & Plan Note (Signed)
Hyperlipidemia:Low fat diet discussed and encouraged.   Lipid Panel  Lab Results  Component Value Date   CHOL 184 06/26/2021   HDL 81 06/26/2021   LDLCALC 91 06/26/2021   TRIG 64 06/26/2021   CHOLHDL 2.3 06/26/2021

## 2021-10-20 NOTE — Assessment & Plan Note (Signed)
Topical antifungal prescribed 

## 2021-10-20 NOTE — Assessment & Plan Note (Signed)
Rate controlled 

## 2021-10-22 DIAGNOSIS — Z961 Presence of intraocular lens: Secondary | ICD-10-CM | POA: Diagnosis not present

## 2021-10-22 DIAGNOSIS — H04123 Dry eye syndrome of bilateral lacrimal glands: Secondary | ICD-10-CM | POA: Diagnosis not present

## 2021-10-22 DIAGNOSIS — H401134 Primary open-angle glaucoma, bilateral, indeterminate stage: Secondary | ICD-10-CM | POA: Diagnosis not present

## 2021-10-22 DIAGNOSIS — H02834 Dermatochalasis of left upper eyelid: Secondary | ICD-10-CM | POA: Diagnosis not present

## 2021-10-22 DIAGNOSIS — H02831 Dermatochalasis of right upper eyelid: Secondary | ICD-10-CM | POA: Diagnosis not present

## 2021-11-10 ENCOUNTER — Other Ambulatory Visit: Payer: Self-pay | Admitting: Nurse Practitioner

## 2021-11-17 ENCOUNTER — Ambulatory Visit (INDEPENDENT_AMBULATORY_CARE_PROVIDER_SITE_OTHER): Payer: Medicare Other

## 2021-11-17 DIAGNOSIS — I495 Sick sinus syndrome: Secondary | ICD-10-CM | POA: Diagnosis not present

## 2021-11-17 LAB — CUP PACEART REMOTE DEVICE CHECK
Battery Remaining Longevity: 93 mo
Battery Remaining Percentage: 87 %
Battery Voltage: 3.01 V
Brady Statistic AP VP Percent: 1 %
Brady Statistic AP VS Percent: 71 %
Brady Statistic AS VP Percent: 1 %
Brady Statistic AS VS Percent: 28 %
Brady Statistic RA Percent Paced: 70 %
Brady Statistic RV Percent Paced: 1 %
Date Time Interrogation Session: 20230213030218
Implantable Lead Implant Date: 20021126
Implantable Lead Implant Date: 20021126
Implantable Lead Location: 753859
Implantable Lead Location: 753860
Implantable Pulse Generator Implant Date: 20210412
Lead Channel Impedance Value: 410 Ohm
Lead Channel Impedance Value: 490 Ohm
Lead Channel Pacing Threshold Amplitude: 0.5 V
Lead Channel Pacing Threshold Amplitude: 1 V
Lead Channel Pacing Threshold Pulse Width: 0.5 ms
Lead Channel Pacing Threshold Pulse Width: 0.9 ms
Lead Channel Sensing Intrinsic Amplitude: 1.2 mV
Lead Channel Sensing Intrinsic Amplitude: 5.6 mV
Lead Channel Setting Pacing Amplitude: 2 V
Lead Channel Setting Pacing Amplitude: 2.5 V
Lead Channel Setting Pacing Pulse Width: 0.9 ms
Lead Channel Setting Sensing Sensitivity: 2 mV
Pulse Gen Model: 2272
Pulse Gen Serial Number: 3814236

## 2021-11-19 NOTE — Progress Notes (Signed)
Remote pacemaker transmission.   

## 2021-12-08 ENCOUNTER — Telehealth: Payer: Self-pay | Admitting: Family Medicine

## 2021-12-08 ENCOUNTER — Other Ambulatory Visit: Payer: Self-pay | Admitting: *Deleted

## 2021-12-08 ENCOUNTER — Other Ambulatory Visit: Payer: Self-pay | Admitting: Family Medicine

## 2021-12-08 DIAGNOSIS — L600 Ingrowing nail: Secondary | ICD-10-CM

## 2021-12-08 NOTE — Telephone Encounter (Signed)
Ok to place referral.

## 2021-12-08 NOTE — Telephone Encounter (Signed)
Daughter notified referral placed to [facility]  ?

## 2021-12-08 NOTE — Telephone Encounter (Signed)
[facility] nurse came out for house call on patient, they are requesting patient have podiatry referral for overgrown toenails ? ?Can call Daughter Jeannene Patella back in regard ?

## 2021-12-10 ENCOUNTER — Ambulatory Visit: Payer: Medicare Other | Admitting: Podiatry

## 2021-12-22 ENCOUNTER — Ambulatory Visit: Payer: Medicare Other | Admitting: Podiatry

## 2021-12-22 ENCOUNTER — Encounter: Payer: Self-pay | Admitting: Podiatry

## 2021-12-22 ENCOUNTER — Other Ambulatory Visit: Payer: Self-pay

## 2021-12-22 DIAGNOSIS — M79675 Pain in left toe(s): Secondary | ICD-10-CM | POA: Diagnosis not present

## 2021-12-22 DIAGNOSIS — M79674 Pain in right toe(s): Secondary | ICD-10-CM

## 2021-12-22 DIAGNOSIS — B351 Tinea unguium: Secondary | ICD-10-CM | POA: Diagnosis not present

## 2021-12-24 NOTE — Progress Notes (Signed)
?  Subjective:  ?Patient ID: Candace Cervantes, female    DOB: [birth year],  MRN: [id number] ? ?Chief Complaint  ?Patient presents with  ? Nail Problem  ?  Nail trim, painful bilat great toenails, pincer nails, possible ingrowns bilat great toenails both borders each nail  ? ? ?88 y.o. female presents with the above complaint. History confirmed with patient.  Here with her daughter who confirms the history, nails are very thick elongated brown-yellow discolored and painful ? ?Objective:  ?Physical Exam: ?warm, good capillary refill, no trophic changes or ulcerative lesions, normal DP and PT pulses, and normal sensory exam. ?Left Foot: dystrophic yellowed discolored nail plates with subungual debris ?Right Foot: dystrophic yellowed discolored nail plates with subungual debris ? ? ?Assessment:  ? ?1. Pain due to onychomycosis of toenails of both feet   ? ? ? ?Plan:  ?Patient was evaluated and treated and all questions answered. ? ?Discussed the etiology and treatment options for the condition in detail with the patient. Educated patient on the topical and oral treatment options for mycotic nails. Recommended debridement of the nails today. Sharp and mechanical debridement performed of all painful and mycotic nails today. Nails debrided in length and thickness using a nail nipper to level of comfort. Discussed treatment options including appropriate shoe gear. Follow up as needed for painful nails. ? ? ? ?Return in about 3 months (around 03/24/2022) for painful thick nails .  ? ?

## 2022-01-09 ENCOUNTER — Other Ambulatory Visit: Payer: Self-pay | Admitting: Family Medicine

## 2022-02-09 ENCOUNTER — Other Ambulatory Visit: Payer: Self-pay | Admitting: Family Medicine

## 2022-02-12 ENCOUNTER — Telehealth: Payer: Self-pay

## 2022-02-12 ENCOUNTER — Other Ambulatory Visit: Payer: Self-pay | Admitting: Family Medicine

## 2022-02-12 DIAGNOSIS — E559 Vitamin D deficiency, unspecified: Secondary | ICD-10-CM

## 2022-02-12 DIAGNOSIS — I10 Essential (primary) hypertension: Secondary | ICD-10-CM

## 2022-02-12 DIAGNOSIS — E7849 Other hyperlipidemia: Secondary | ICD-10-CM

## 2022-02-12 DIAGNOSIS — R7302 Impaired glucose tolerance (oral): Secondary | ICD-10-CM

## 2022-02-12 NOTE — Telephone Encounter (Signed)
Fasting lab order in system including requested test. Can come have labs next week  ?

## 2022-02-12 NOTE — Telephone Encounter (Signed)
Patient daughter called thinks patient may be diabetic and asking if Dr Moshe Cipro can order some more test lab work to her lab order, patient coming in next week for blood work.   ?

## 2022-02-16 ENCOUNTER — Ambulatory Visit (INDEPENDENT_AMBULATORY_CARE_PROVIDER_SITE_OTHER): Payer: Medicare Other

## 2022-02-16 ENCOUNTER — Telehealth: Payer: Self-pay | Admitting: Internal Medicine

## 2022-02-16 DIAGNOSIS — I495 Sick sinus syndrome: Secondary | ICD-10-CM

## 2022-02-16 NOTE — Telephone Encounter (Signed)
°  1. Has your device fired? no ° °2. Is you device beeping? no ° °3. Are you experiencing draining or swelling at device site?no ° °4. Are you calling to see if we received your device transmission?yes ° °5. Have you passed out? no ° ° ° °Please route to Device Clinic Pool °

## 2022-02-16 NOTE — Telephone Encounter (Signed)
Returned call to [facility].  Advised report had come through. ?

## 2022-02-17 LAB — CUP PACEART REMOTE DEVICE CHECK
Battery Remaining Longevity: 90 mo
Battery Remaining Percentage: 85 %
Battery Voltage: 3.01 V
Brady Statistic AP VP Percent: 1 %
Brady Statistic AP VS Percent: 70 %
Brady Statistic AS VP Percent: 1 %
Brady Statistic AS VS Percent: 29 %
Brady Statistic RA Percent Paced: 69 %
Brady Statistic RV Percent Paced: 1 %
Date Time Interrogation Session: 20230515024509
Implantable Lead Implant Date: 20021126
Implantable Lead Implant Date: 20021126
Implantable Lead Location: 753859
Implantable Lead Location: 753860
Implantable Pulse Generator Implant Date: 20210412
Lead Channel Impedance Value: 400 Ohm
Lead Channel Impedance Value: 490 Ohm
Lead Channel Pacing Threshold Amplitude: 0.5 V
Lead Channel Pacing Threshold Amplitude: 1 V
Lead Channel Pacing Threshold Pulse Width: 0.5 ms
Lead Channel Pacing Threshold Pulse Width: 0.9 ms
Lead Channel Sensing Intrinsic Amplitude: 1.2 mV
Lead Channel Sensing Intrinsic Amplitude: 6.3 mV
Lead Channel Setting Pacing Amplitude: 2 V
Lead Channel Setting Pacing Amplitude: 2.5 V
Lead Channel Setting Pacing Pulse Width: 0.9 ms
Lead Channel Setting Sensing Sensitivity: 2 mV
Pulse Gen Model: 2272
Pulse Gen Serial Number: 3814236

## 2022-02-24 DIAGNOSIS — E7849 Other hyperlipidemia: Secondary | ICD-10-CM | POA: Diagnosis not present

## 2022-02-24 DIAGNOSIS — E559 Vitamin D deficiency, unspecified: Secondary | ICD-10-CM | POA: Diagnosis not present

## 2022-02-24 DIAGNOSIS — R7302 Impaired glucose tolerance (oral): Secondary | ICD-10-CM | POA: Diagnosis not present

## 2022-02-24 DIAGNOSIS — I10 Essential (primary) hypertension: Secondary | ICD-10-CM | POA: Diagnosis not present

## 2022-02-25 LAB — CMP14+EGFR
ALT: 19 IU/L (ref 0–32)
AST: 25 IU/L (ref 0–40)
Albumin/Globulin Ratio: 1.6 (ref 1.2–2.2)
Albumin: 4.6 g/dL (ref 3.5–4.6)
Alkaline Phosphatase: 65 IU/L (ref 44–121)
BUN/Creatinine Ratio: 22 (ref 12–28)
BUN: 22 mg/dL (ref 10–36)
Bilirubin Total: 0.4 mg/dL (ref 0.0–1.2)
CO2: 27 mmol/L (ref 20–29)
Calcium: 9.9 mg/dL (ref 8.7–10.3)
Chloride: 102 mmol/L (ref 96–106)
Creatinine, Ser: 1 mg/dL (ref 0.57–1.00)
Globulin, Total: 2.9 g/dL (ref 1.5–4.5)
Glucose: 104 mg/dL — ABNORMAL HIGH (ref 70–99)
Potassium: 3.7 mmol/L (ref 3.5–5.2)
Sodium: 143 mmol/L (ref 134–144)
Total Protein: 7.5 g/dL (ref 6.0–8.5)
eGFR: 52 mL/min/{1.73_m2} — ABNORMAL LOW (ref >59)

## 2022-02-25 LAB — CBC
Hematocrit: 36.1 % (ref 34.0–46.6)
Hemoglobin: 12 g/dL (ref 11.1–15.9)
MCH: 31.3 pg (ref 26.6–33.0)
MCHC: 33.2 g/dL (ref 31.5–35.7)
MCV: 94 fL (ref 79–97)
Platelets: 184 10*3/uL (ref 150–450)
RBC: 3.83 x10E6/uL (ref 3.77–5.28)
RDW: 13.5 % (ref 11.7–15.4)
WBC: 4.1 10*3/uL (ref 3.4–10.8)

## 2022-02-25 LAB — VITAMIN D 25 HYDROXY (VIT D DEFICIENCY, FRACTURES): Vit D, 25-Hydroxy: 42.1 ng/mL (ref 30.0–100.0)

## 2022-02-25 LAB — LIPID PANEL
Chol/HDL Ratio: 2.1 ratio (ref 0.0–4.4)
Cholesterol, Total: 173 mg/dL (ref 100–199)
HDL: 83 mg/dL (ref >39)
LDL Chol Calc (NIH): 80 mg/dL (ref 0–99)
Triglycerides: 51 mg/dL (ref 0–149)
VLDL Cholesterol Cal: 10 mg/dL (ref 5–40)

## 2022-02-25 LAB — HEMOGLOBIN A1C
Est. average glucose Bld gHb Est-mCnc: 123 mg/dL
Hgb A1c MFr Bld: 5.9 % — ABNORMAL HIGH (ref 4.8–5.6)

## 2022-03-04 ENCOUNTER — Encounter: Payer: Self-pay | Admitting: Family Medicine

## 2022-03-04 ENCOUNTER — Ambulatory Visit (INDEPENDENT_AMBULATORY_CARE_PROVIDER_SITE_OTHER): Payer: Medicare Other | Admitting: Family Medicine

## 2022-03-04 VITALS — BP 148/68 | HR 62 | Resp 16 | Ht 71.0 in | Wt 164.1 lb

## 2022-03-04 DIAGNOSIS — R251 Tremor, unspecified: Secondary | ICD-10-CM

## 2022-03-04 DIAGNOSIS — K219 Gastro-esophageal reflux disease without esophagitis: Secondary | ICD-10-CM | POA: Diagnosis not present

## 2022-03-04 DIAGNOSIS — I10 Essential (primary) hypertension: Secondary | ICD-10-CM | POA: Diagnosis not present

## 2022-03-04 DIAGNOSIS — M159 Polyosteoarthritis, unspecified: Secondary | ICD-10-CM | POA: Diagnosis not present

## 2022-03-04 DIAGNOSIS — M150 Primary generalized (osteo)arthritis: Secondary | ICD-10-CM

## 2022-03-04 DIAGNOSIS — E7849 Other hyperlipidemia: Secondary | ICD-10-CM

## 2022-03-04 NOTE — Patient Instructions (Addendum)
annual exam end September, flu vaccine at visit, call if you needme sooner  You need covid booster and TdAP  Labs are very good, except you a need to reduce sweets, so you do NOT  become diabetic, you are prediabetic ( 6.5 is diabetes, you are 5.9)  Blood pressure higher than goal, please cut back on salty foods  Increase water to 60 ounces per day  I am refrrring you to Neurologist to assess tremors noted in past 6 months. Also make sure that you get sufficient rest  during the day  PLEASE use walker in the home to reduce falls  I am referring you Cardiology for follow up  Thanks for choosing [facility], we consider it a privelige to serve you.

## 2022-03-06 NOTE — Progress Notes (Signed)
Remote pacemaker transmission.   

## 2022-03-10 ENCOUNTER — Other Ambulatory Visit: Payer: Self-pay | Admitting: Family Medicine

## 2022-03-10 ENCOUNTER — Encounter: Payer: Self-pay | Admitting: Family Medicine

## 2022-03-10 DIAGNOSIS — R251 Tremor, unspecified: Secondary | ICD-10-CM | POA: Insufficient documentation

## 2022-03-10 NOTE — Assessment & Plan Note (Signed)
Home safety and consistent use of assistive device encouraged

## 2022-03-10 NOTE — Assessment & Plan Note (Signed)
Hyperlipidemia:Low fat diet discussed and encouraged.   Lipid Panel  Lab Results  Component Value Date   CHOL 173 02/24/2022   HDL 83 02/24/2022   LDLCALC 80 02/24/2022   TRIG 51 02/24/2022   CHOLHDL 2.1 02/24/2022   Controlled, no change in medication

## 2022-03-10 NOTE — Assessment & Plan Note (Signed)
Controlled, no change in medication  

## 2022-03-10 NOTE — Assessment & Plan Note (Signed)
3 to 4 month h/o tremor which is increasing in frequency, severity and duraration, debilitating at times, Neurology to eval and manage

## 2022-03-10 NOTE — Progress Notes (Signed)
   Candace Cervantes     MRN: [id number]      DOB: [birth year]   HPI Candace Cervantes is here for follow up and re-evaluation of chronic medical conditions, medication management and review of any available recent lab and radiology data.  Preventive health is updated, specifically  Cancer screening and Immunization.   The PT denies any adverse reactions to current medications since the last visit.  C/o increasing frequency and severity of tremors which at times are disabling,  over the past 3 to 4 months  ROS Denies recent fever or chills. Denies sinus pressure, nasal congestion, ear pain or sore throat. Denies chest congestion, productive cough or wheezing. Denies chest pains, palpitations and leg swelling Denies abdominal pain, nausea, vomiting,diarrhea or constipation.   Denies dysuria, frequency, hesitancy or incontinence. Chronic  joint pain, swelling and limitation in mobility. Denies depression, uncontrolled anxiety or insomnia. Denies skin break down or rash.   PE  BP (!) 148/68   Pulse 62   Resp 16   Ht '5\' 11"'$  (1.803 m)   Wt 164 lb 1.9 oz (74.4 kg)   SpO2 96%   BMI 22.89 kg/m   Patient alert and oriented and in no cardiopulmonary distress.  HEENT: No facial asymmetry, EOMI,     Neck decreased ROM Chest: Clear to auscultation bilaterally.  CVS: S1, S2 no murmurs, no S3.Regular rate.  ABD: Soft non tender.   Ext: No edema  MS: decreased  ROM spine, shoulders, hips and knees.  Skin: Intact, no ulcerations or rash noted.  Psych: Good eye contact, normal affect. Memory intact not anxious or depressed appearing.  CNS: CN 2-12 intact, power,  normal throughout.no focal deficits noted.No tremors noted at time of exam   Assessment & Plan  Essential hypertension Not at goal, no med change, pt encouraged to reduce salt intake and reduce processed foods, inc fresh produce  GERD Controlled, no change in medication   Hyperlipemia Hyperlipidemia:Low fat diet discussed and  encouraged.   Lipid Panel  Lab Results  Component Value Date   CHOL 173 02/24/2022   HDL 83 02/24/2022   LDLCALC 80 02/24/2022   TRIG 51 02/24/2022   CHOLHDL 2.1 02/24/2022   Controlled, no change in medication     Osteoarthritis Home safety and consistent use of assistive device encouraged  Tremor 3 to 4 month h/o tremor which is increasing in frequency, severity and duraration, debilitating at times, Neurology to eval and manage

## 2022-03-10 NOTE — Assessment & Plan Note (Signed)
Not at goal, no med change, pt encouraged to reduce salt intake and reduce processed foods, inc fresh produce

## 2022-03-23 ENCOUNTER — Ambulatory Visit: Payer: Medicare Other | Admitting: Podiatry

## 2022-03-23 ENCOUNTER — Encounter: Payer: Self-pay | Admitting: Podiatry

## 2022-03-23 DIAGNOSIS — M79674 Pain in right toe(s): Secondary | ICD-10-CM

## 2022-03-23 DIAGNOSIS — B351 Tinea unguium: Secondary | ICD-10-CM | POA: Diagnosis not present

## 2022-03-23 DIAGNOSIS — M79675 Pain in left toe(s): Secondary | ICD-10-CM

## 2022-03-23 NOTE — Progress Notes (Signed)

## 2022-03-26 ENCOUNTER — Ambulatory Visit: Payer: Medicare Other | Admitting: Podiatry

## 2022-04-09 ENCOUNTER — Other Ambulatory Visit: Payer: Self-pay | Admitting: Family Medicine

## 2022-04-15 ENCOUNTER — Ambulatory Visit: Payer: Medicare Other | Admitting: Neurology

## 2022-04-15 ENCOUNTER — Encounter: Payer: Self-pay | Admitting: Neurology

## 2022-04-15 VITALS — BP 143/71 | HR 60 | Wt 163.0 lb

## 2022-04-15 DIAGNOSIS — R251 Tremor, unspecified: Secondary | ICD-10-CM

## 2022-04-15 DIAGNOSIS — R450 Nervousness: Secondary | ICD-10-CM | POA: Diagnosis not present

## 2022-04-15 NOTE — Progress Notes (Signed)
Subjective:    Patient ID: Candace Cervantes is a 88 y.o. female.  HPI    Star Age, MD, PhD [facility] [address] [place], [place] [address]  Dear Dr. Moshe Cipro,  I saw your patient, Candace Cervantes, upon your kind request in my neurologic clinic today for initial consultation of her tremors.  The patient is accompanied by her daughter today.  As you know, [name] is a 88 year old right-handed woman with an underlying medical history of hypertension, hyperlipidemia, sick sinus syndrome, s/p pacemaker placement, reflux disease, coronary artery disease, anxiety, arthritis, and osteoporosis, who reports an approximately 86-monthhistory of intermittent trembling affecting primarily her hands and arms, particularly first thing in the morning.  It helps to drink water or liquids in general.  She does not lose consciousness or awareness.  She does not have any whole body shaking or rigidity or stiffness, has not fallen because of this, no bowel or bladder incontinence.  She does not always rest well.  Daughter reports that her shaking has been noticeable intermittently and does subside when she drinks cold water.  She has had a change in her living situation as her other daughter and son-in-law moved in some 4 to 5 months ago to be there and help out.  She uses a walker at home, has 2 types of walkers, 2 wheels and 4 wheels with seat.  She tries to hydrate well but may not always drink enough during the day.  She limits her caffeine to 1 cup of coffee in the morning.  She is widowed, she is retired, has 2 grown daughters.  Patient admits that she is nervous and has always been anxious and nervous throughout her life, she attributes this to stress when her mom was pregnant with her.  She has no family history of tremors or Parkinson's disease.  She does not drink any alcohol, no smoking.   I reviewed your office note from 03/04/2022.  She was not noted to have any  tremor at the time.  She had blood work through your office on 02/24/2022 and I reviewed the results: A1c was 5.9, lipid panel benign with a total cholesterol of 173, triglycerides 51, HDL 83, LDL 80, CMP showed blood sugar of 104, BUN 22, creatinine 1.0, alk phos 65, AST 25, ALT 19, vitamin D 42.1, CBC with platelets benign, TSH in September 2022 was normal at 2.45.  She has had normal TSH values for the past several years.  She had a brain MRI without contrast several years ago on 04/04/2001 for indication of near syncope and headaches.  I reviewed the results: Impression: Small vessel chronic ischemic changes of deep cerebral white matter.  No acute intracranial abnormalities.  She had a head CT without contrast on 02/14/2018 with indication of generalized headache and I reviewed the results: Impression: No acute intracranial abnormality noted.  Prominent chronic microvascular changes.  Atrophy.  Her Past Medical History Is Significant For: Past Medical History:  Diagnosis Date   Anxiety disorder    Arthritis    CAD (coronary artery disease) 2005   Nonobstructive 40% RCA lesion and normal ejection fraction at cath    Chronic headache disorder 10/09/2017   GERD (gastroesophageal reflux disease)    Hyperlipidemia    Hypertension    Osteoporosis    Pacemaker 2002   Permanent Placement   Pacemaker 2007   Generator Changed    Prediabetes    Sinoatrial node dysfunction ([place]  SSS (sick sinus syndrome) ([place])    Zenker diverticulum    previously evaluated by Dr. Erik Obey in 5643.     Her Past Surgical History Is Significant For: Past Surgical History:  Procedure Laterality Date   CHOLECYSTECTOMY     COLONOSCOPY  2003   no polyps.   CYSTECTOMY     from back of the neck   ESOPHAGOGASTRODUODENOSCOPY  2008   Dr. Oneida Alar, difficulty passing scope through UES, incomplete fibrous ring at GEJ, hh, multiple benign gastri polyps   ESOPHAGOGASTRODUODENOSCOPY N/A 10/17/2013   ZENKER'S, STRICTURE:  10-12.8 MM,  NSAID GASTRITIS FG POLYPS   ESOPHAGOGASTRODUODENOSCOPY (EGD) WITH ESOPHAGEAL DILATION N/A 10/30/2013   SLF: 1. Zenkers diverticulum with a large opening at the cricopharyngeus 2. 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 Innumerable Fundic gland polyps 4. Mild NSAID gastritis   MALONEY DILATION N/A 10/17/2013   Procedure: MALONEY DILATION;  Surgeon: Danie Binder, MD;  [facility];  Service: Endoscopy;  Laterality: N/A;  with PEDS GASTROSCOPE   PACEMAKER INSERTION  2002   PACEMAKER PLACEMENT  2007   replacement    PPM GENERATOR CHANGEOUT N/A 01/15/2020   Procedure: PPM GENERATOR CHANGEOUT;  Surgeon: Evans Lance, MD;  Location: [facility];  Service: Cardiovascular;  Laterality: N/A;   SAVORY DILATION N/A 10/17/2013   Procedure: SAVORY DILATION;  Surgeon: Danie Binder, MD;  Location: [facility];  Service: Endoscopy;  Laterality: N/A;  with PEDS GASTROSCOPE   TOTAL ABDOMINAL HYSTERECTOMY  2002    Her Family History Is Significant For: Family History  Problem Relation Age of Onset   Heart disease Mother    Lung disease Sister    Diabetes Sister    Alcohol abuse Brother    Cancer Brother        possible prostate   Colon cancer Other        sibling, age 78   Heart attack Other        uncle    Her Social History Is Significant For: Social History   Socioeconomic History   Marital status: Widowed    Spouse name: Not on file   Number of children: 2   Years of education: Not on file   Highest education level: 12th grade  Occupational History   Not on file  Tobacco Use   Smoking status: Never   Smokeless tobacco: Never  Vaping Use   Vaping Use: Never used  Substance and Sexual Activity   Alcohol use: Never   Drug use: Never   Sexual activity: Not Currently  Other Topics Concern   Not on file  Social History Narrative   Lives with daughter Little Ishikawa    Right handed    Caffeine: coffee 1 cup/day   Social Determinants of Health    Financial Resource Strain: Low Risk  (09/01/2021)   Overall Financial Resource Strain (CARDIA)    Difficulty of Paying Living Expenses: Not very hard  Food Insecurity: No Food Insecurity (09/01/2021)   Hunger Vital Sign    Worried About Running Out of Food in the Last Year: Never true    Ran Out of Food in the Last Year: Never true  Transportation Needs: No Transportation Needs (07/30/2020)   PRAPARE - Hydrologist (Medical): No    Lack of Transportation (Non-Medical): No  Physical Activity: Insufficiently Active (09/01/2021)   Exercise Vital Sign    Days of Exercise per Week: 3 days    Minutes of  Exercise per Session: 10 min  Stress: No Stress Concern Present (09/01/2021)   [place]    Feeling of Stress : Only a little  Social Connections: Moderately Isolated (09/01/2021)   Social Connection and Isolation Panel [NHANES]    Frequency of Communication with Friends and Family: Three times a week    Frequency of Social Gatherings with Friends and Family: Twice a week    Attends Religious Services: More than 4 times per year    Active Member of [employer] or Organizations: No    Attends Archivist Meetings: Never    Marital Status: Widowed    Her Allergies Are:  No Known Allergies:   Her Current Medications Are:  Outpatient Encounter Medications as of 04/15/2022  Medication Sig   Acetaminophen (TYLENOL 8 HOUR ARTHRITIS PAIN PO) Take 1 tablet by mouth daily. Sometimes takes 1 extra per day   amLODipine (NORVASC) 2.5 MG tablet TAKE 1 TABLET BY MOUTH ONCE DAILY. TAKE WITH 5MG TABLET.   amLODipine (NORVASC) 5 MG tablet TAKE 1 TABLET BY MOUTH ONCE A DAY. TAKE WITH 2.5MG TABLETS.   citalopram (CELEXA) 10 MG tablet TAKE ONE TABLET BY MOUTH ONCE DAILY.   dorzolamide-timolol (COSOPT) 22.3-6.8 MG/ML ophthalmic solution 1 drop 2 (two) times daily.   Famotidine (ACID CONTROLLER PO) Take 10  mg by mouth daily.   hydrochlorothiazide (MICROZIDE) 12.5 MG capsule TAKE 1 CAPSULE BY MOUTH ONCE DAILY.   isosorbide mononitrate (IMDUR) 30 MG 24 hr tablet TAKE (1/2) TABLET BY MOUTH ONCE DAILY.   latanoprost (XALATAN) 0.005 % ophthalmic solution 1 drop daily.   Multiple Vitamin (MULTIVITAMIN WITH MINERALS) TABS tablet Take 1 tablet by mouth daily.   nitroGLYCERIN (NITROSTAT) 0.4 MG SL tablet DISSOLVE 1 TABLET SUBLINGUALLY AS NEEDED FOR CHEST PAIN, MAY REPEAT EVERY 5 MINUTES. AFTER 3 TABLETS CALL 911.   omeprazole (PRILOSEC) 20 MG capsule TAKE ONE CAPSULE BY MOUTH ONCE DAILY.   pravastatin (PRAVACHOL) 40 MG tablet TAKE ONE TABLET BY MOUTH ONCE DAILY.   clotrimazole-betamethasone (LOTRISONE) cream Apply 1 application topically 2 (two) times daily. (Patient not taking: Reported on 04/15/2022)   [DISCONTINUED] acetaminophen (TYLENOL) 325 MG tablet Take 325 mg by mouth in the morning and at bedtime.    [DISCONTINUED] OVER THE COUNTER MEDICATION 1 each daily. Super beets chews (Patient not taking: Reported on 04/15/2022)   No facility-administered encounter medications on file as of 04/15/2022.  :   Review of Systems:  Out of a complete 14 point review of systems, all are reviewed and negative with the exception of these symptoms as listed below:  Review of Systems  Neurological:        Here with daughter for tremors/shakes of whole body, ongoing for 5 months. The shaking comes in episodes about twice in a week and occurs about every other week. Episodes mostly happen when she gets up in the morning. No known family history of tremors or PD.     Objective:  Neurological Exam  Physical Exam Physical Examination:   Vitals:   04/15/22 1434  BP: (!) 143/71  Pulse: 60    General Examination: The patient is a very pleasant 88 y.o. female in no acute distress. She appears well-developed and well-nourished and well groomed.   HEENT: Normocephalic, atraumatic, pupils are equal, round and  reactive to light, extraocular tracking is good without limitation to gaze excursion or nystagmus noted. Hearing is grossly intact.  Corrective eyeglasses in place, forward flexion of her  neck noted.  Face is symmetric with normal facial animation. Speech is clear with no dysarthria noted. There is no hypophonia. There is no lip, neck/head, jaw or voice tremor. Neck is supple with full range of passive and active motion. There are no carotid bruits on auscultation. Oropharynx exam reveals: moderate mouth dryness, adequate dental hygiene with dentures in place.  Tongue protrudes centrally and palate elevates symmetrically.    Chest: Clear to auscultation without wheezing, rhonchi or crackles noted.  Heart: S1+S2+0, regular and normal without murmurs, rubs or gallops noted.   Abdomen: Soft, non-tender and non-distended with normal bowel sounds appreciated on auscultation.  Extremities: There is no pitting edema in the distal lower extremities bilaterally.   Skin: Warm and dry without trophic changes noted.   Musculoskeletal: exam reveals no obvious joint deformities, with exception contractures digits 3 and 4 on the left hand.   Neurologically:  Mental status: The patient is awake, alert and oriented in all 4 spheres. Her immediate and remote memory, attention, language skills and fund of knowledge are appropriate. There is no evidence of aphasia, agnosia, apraxia or anomia. Speech is clear with normal prosody and enunciation. Thought process is linear. Mood is normal and affect is normal.  Cranial nerves II - XII are as described above under HEENT exam.  Motor exam: Normal bulk, strength and tone is noted. There is no resting tremor, no postural or action tremor.  Archimedes spiral drawing shows some insecurity with the left hand, no significant tremor with the right hand.  Handwriting is legible, not tremulous, not particularly micrographic.   She has some deliberate movements and hesitation with  finger-to-nose testing with the left upper extremity.  Reflexes are 1+ throughout.  Finger taps and hand movements unremarkable, difficulty with making a fist on the left side secondary to contractures.    Cerebellar testing: No dysmetria or intention tremor. There is no truncal or gait ataxia.  Sensory exam: intact to light touch in the upper and lower extremities.  Gait, station and balance: She stands  with difficulty and requires assistance.  She is in a wheelchair, she did not bring her walker, she was able to take a few steps forward and slowly walk for me, no telltale shuffling.  She does have preserved arm swing, she walks slowly and cautiously, she does have excess forward flexion of her upper body and a stooped neck and upper back.    Assessment and Plan:   In summary, HAYLEN BELLOTTI is a very pleasant 88 y.o.-year old female with an underlying medical history of hypertension, hyperlipidemia, sick sinus syndrome, s/p pacemaker placement, reflux disease, coronary artery disease, anxiety, arthritis, and osteoporosis, who presents for evaluation of her intermittent trembling.  On examination she has some hesitation and deliberate movements with finger-to-nose testing on the left side but no actual tremors as such, no evidence of parkinsonism, notable features resembling essential tremor, history also not suggestive of a familial tremor.  She is largely reassured today.  We talked about triggers that can affect motor function including dehydration, stress, anxiety, nervousness, sleep deprivation or just not sleeping well in general.  She has had some stressors recently, change in her living situation, she admits that sometimes she would feel scared at night and she has always been a nervous and anxious person since her childhood, attributes this to stress predating her birth event (when her mom was pregnant with her).  We talked about the importance of good hydration, getting enough rest, stress  reduction.  I did not suggest any new medications for her.  History and exam findings are not concerning for seizures.  At this juncture, she is advised to follow-up with you as scheduled/planned.  I answered all of their questions today and the patient and her daughter were in agreement.  Thank you very much for allowing me to participate in the care of this nice patient. If I can be of any further assistance to you please do not hesitate to call me at [phone].  Sincerely,   Star Age, MD, PhD

## 2022-04-15 NOTE — Patient Instructions (Addendum)
It was nice to meet you both today.  You have an intermittent tremor, no obvious tremor today. I do not see any signs or symptoms of parkinson's like disease or what we call parkinsonism.   For your tremor, I would not recommend any new medications at this time.  It is possible that your shaking in the mornings is related to dehydration or not resting well, often also stress related.  Please remember, that any kind of tremor may be exacerbated by anxiety, anger, nervousness, excitement, dehydration, sleep deprivation, thyroid dysfunction, by caffeine, and low blood sugar values or blood sugar fluctuations. Some medications can exacerbate tremors, this includes certain asthma or COPD medications and certain antidepressants.   You have had recent blood work, we do not need to do any additional testing, you can follow-up with your primary care as scheduled/planned.

## 2022-04-23 ENCOUNTER — Encounter: Payer: Self-pay | Admitting: Family Medicine

## 2022-04-23 ENCOUNTER — Ambulatory Visit (INDEPENDENT_AMBULATORY_CARE_PROVIDER_SITE_OTHER): Payer: Medicare Other | Admitting: Family Medicine

## 2022-04-23 ENCOUNTER — Ambulatory Visit (HOSPITAL_COMMUNITY)
Admission: RE | Admit: 2022-04-23 | Discharge: 2022-04-23 | Disposition: A | Discharge status: Home / Self Care | Payer: Medicare Other | Source: Ambulatory Visit | Attending: Family Medicine | Admitting: Family Medicine

## 2022-04-23 VITALS — BP 150/80 | HR 76 | Resp 16 | Ht 71.0 in | Wt 163.0 lb

## 2022-04-23 DIAGNOSIS — H9190 Unspecified hearing loss, unspecified ear: Secondary | ICD-10-CM | POA: Insufficient documentation

## 2022-04-23 DIAGNOSIS — G3184 Mild cognitive impairment, so stated: Secondary | ICD-10-CM | POA: Insufficient documentation

## 2022-04-23 DIAGNOSIS — I10 Essential (primary) hypertension: Secondary | ICD-10-CM | POA: Diagnosis not present

## 2022-04-23 DIAGNOSIS — H9193 Unspecified hearing loss, bilateral: Secondary | ICD-10-CM | POA: Diagnosis not present

## 2022-04-23 DIAGNOSIS — G459 Transient cerebral ischemic attack, unspecified: Secondary | ICD-10-CM

## 2022-04-23 DIAGNOSIS — H6123 Impacted cerumen, bilateral: Secondary | ICD-10-CM

## 2022-04-23 DIAGNOSIS — R413 Other amnesia: Secondary | ICD-10-CM | POA: Diagnosis not present

## 2022-04-23 NOTE — Patient Instructions (Signed)
F/u as before, call if you need me sooner  You are referred urgently for head scan and [place] of carotid arteries as well as to ENT  Nurse will flush ears today to see if this will help your hearing  I will ensure you do have a Cardiology f/u appointment  If you develop sudden weakness, difficulty speaking , hearing , seeing need to go to the ED, as these may be signs of a stroke  Thanks for choosing [facility], we consider it a privelige to serve you.

## 2022-04-23 NOTE — Progress Notes (Signed)
   Candace Cervantes     MRN: [id number]      DOB: 1925/09/21   HPI Candace Cervantes is here wit a 1 week h/o acute hearing loss, family now has to communicate in writing During this time she has an epside where she stopped verbal communication and just used her hands, did not speak for 1 hour, this is the 2nd episode in the past 1 month ROS Denies recent fever or chills. Denies sinus pressure, nasal congestion, ear pain or sore throat. Denies chest congestion, productive cough or wheezing. Denies cmiting,diarrhea or constipation.   Denies dysuria, frequency, hesitancy or incontinence. Chronic joint pain and limitation in mobility.  PE  BP (!) 168/74   Pulse 76   Resp 16   Ht '5\' 11"'$  (1.803 m)   Wt 163 lb (73.9 kg)   SpO2 95%   BMI 22.73 kg/m   Patient alert  and in no cardiopulmonary distress.  HEENT: No facial asymmetry, EOMI,     Neck supple .Right tM moderate cerumen, left tM scant cerumen  Chest: Clear to auscultation bilaterally.  CVS: S1, S2 no murmurs, no S3.Regular rate.  ABD: Soft non tender.   Ext: No edema  MS: decreased  ROM spine, shoulders, hips and knees.  Skin: Intact, no ulcerations or rash noted.  Psych: Good eye contact, mildly anxious and tearful at times.   Assessment & Plan  Hearing loss 1 week h/o markedly reduced hearing with episode of expresive aphasia also, will obtain CT brain , US carotid and refer to ENT   TIA (transient ischemic attack) Reported episode pf aphasia x 1 hr and prior episode, unsure for how long inth past 2 weeks, also has acute hearing loss, needs carotid doppler. Advised ED for recurrence or similar symptoms  Bilateral impacted cerumen Moderate on right and mild on left,unable to successfully flush, atraumatic, eNT to eval and manage  Essential hypertension subotimal control, not at goal, no med change at this time

## 2022-04-24 ENCOUNTER — Encounter: Payer: Self-pay | Admitting: Family Medicine

## 2022-04-24 DIAGNOSIS — G459 Transient cerebral ischemic attack, unspecified: Secondary | ICD-10-CM | POA: Insufficient documentation

## 2022-04-24 DIAGNOSIS — H6123 Impacted cerumen, bilateral: Secondary | ICD-10-CM | POA: Insufficient documentation

## 2022-04-24 NOTE — Assessment & Plan Note (Signed)
Moderate on right and mild on left,unable to successfully flush, atraumatic, eNT to eval and manage

## 2022-04-24 NOTE — Assessment & Plan Note (Signed)
1 week h/o markedly reduced hearing with episode of expresive aphasia also, will obtain CT brain , US carotid and refer to ENT

## 2022-04-24 NOTE — Assessment & Plan Note (Signed)
subotimal control, not at goal, no med change at this time

## 2022-04-24 NOTE — Assessment & Plan Note (Signed)
Reported episode pf aphasia x 1 hr and prior episode, unsure for how long inth past 2 weeks, also has acute hearing loss, needs carotid doppler. Advised ED for recurrence or similar symptoms

## 2022-05-05 ENCOUNTER — Ambulatory Visit (HOSPITAL_COMMUNITY)
Admission: RE | Admit: 2022-05-05 | Discharge: 2022-05-05 | Disposition: A | Discharge status: Home / Self Care | Payer: Medicare Other | Source: Ambulatory Visit | Attending: Family Medicine | Admitting: Family Medicine

## 2022-05-05 DIAGNOSIS — G459 Transient cerebral ischemic attack, unspecified: Secondary | ICD-10-CM | POA: Diagnosis not present

## 2022-05-05 DIAGNOSIS — I6523 Occlusion and stenosis of bilateral carotid arteries: Secondary | ICD-10-CM | POA: Diagnosis not present

## 2022-05-08 ENCOUNTER — Other Ambulatory Visit: Payer: Self-pay | Admitting: Family Medicine

## 2022-05-12 ENCOUNTER — Telehealth: Payer: Self-pay | Admitting: Family Medicine

## 2022-05-12 NOTE — Telephone Encounter (Signed)
Returning call in regard to patient ultrasound

## 2022-05-13 NOTE — Telephone Encounter (Signed)
Pt daughter aware

## 2022-05-18 ENCOUNTER — Ambulatory Visit: Payer: Medicare Other

## 2022-05-19 LAB — CUP PACEART REMOTE DEVICE CHECK
Battery Remaining Longevity: 89 mo
Battery Remaining Percentage: 82 %
Battery Voltage: 3.01 V
Brady Statistic AP VP Percent: 1 %
Brady Statistic AP VS Percent: 69 %
Brady Statistic AS VP Percent: 1 %
Brady Statistic AS VS Percent: 29 %
Brady Statistic RA Percent Paced: 68 %
Brady Statistic RV Percent Paced: 1 %
Date Time Interrogation Session: 20230814052448
Implantable Lead Implant Date: 20021126
Implantable Lead Implant Date: 20021126
Implantable Lead Location: 753859
Implantable Lead Location: 753860
Implantable Pulse Generator Implant Date: 20210412
Lead Channel Impedance Value: 410 Ohm
Lead Channel Impedance Value: 490 Ohm
Lead Channel Pacing Threshold Amplitude: 0.5 V
Lead Channel Pacing Threshold Amplitude: 1 V
Lead Channel Pacing Threshold Pulse Width: 0.5 ms
Lead Channel Pacing Threshold Pulse Width: 0.9 ms
Lead Channel Sensing Intrinsic Amplitude: 1.4 mV
Lead Channel Sensing Intrinsic Amplitude: 4.8 mV
Lead Channel Setting Pacing Amplitude: 2 V
Lead Channel Setting Pacing Amplitude: 2.5 V
Lead Channel Setting Pacing Pulse Width: 0.9 ms
Lead Channel Setting Sensing Sensitivity: 2 mV
Pulse Gen Model: 2272
Pulse Gen Serial Number: 3814236

## 2022-05-26 ENCOUNTER — Emergency Department (HOSPITAL_COMMUNITY): Payer: Medicare Other

## 2022-05-26 ENCOUNTER — Other Ambulatory Visit: Payer: Self-pay

## 2022-05-26 ENCOUNTER — Emergency Department (HOSPITAL_COMMUNITY)
Admission: EM | Admit: 2022-05-26 | Discharge: 2022-05-26 | Disposition: A | Discharge status: Home / Self Care | Payer: Medicare Other | Attending: Emergency Medicine | Admitting: Emergency Medicine

## 2022-05-26 ENCOUNTER — Encounter (HOSPITAL_COMMUNITY): Payer: Self-pay | Admitting: *Deleted

## 2022-05-26 DIAGNOSIS — M549 Dorsalgia, unspecified: Secondary | ICD-10-CM | POA: Diagnosis not present

## 2022-05-26 DIAGNOSIS — I2510 Atherosclerotic heart disease of native coronary artery without angina pectoris: Secondary | ICD-10-CM | POA: Insufficient documentation

## 2022-05-26 DIAGNOSIS — M545 Low back pain, unspecified: Secondary | ICD-10-CM | POA: Insufficient documentation

## 2022-05-26 DIAGNOSIS — R519 Headache, unspecified: Secondary | ICD-10-CM | POA: Insufficient documentation

## 2022-05-26 DIAGNOSIS — M542 Cervicalgia: Secondary | ICD-10-CM | POA: Diagnosis not present

## 2022-05-26 DIAGNOSIS — Z79899 Other long term (current) drug therapy: Secondary | ICD-10-CM | POA: Insufficient documentation

## 2022-05-26 DIAGNOSIS — W010XXA Fall on same level from slipping, tripping and stumbling without subsequent striking against object, initial encounter: Secondary | ICD-10-CM | POA: Insufficient documentation

## 2022-05-26 DIAGNOSIS — Z950 Presence of cardiac pacemaker: Secondary | ICD-10-CM | POA: Insufficient documentation

## 2022-05-26 DIAGNOSIS — E876 Hypokalemia: Secondary | ICD-10-CM | POA: Diagnosis not present

## 2022-05-26 DIAGNOSIS — I10 Essential (primary) hypertension: Secondary | ICD-10-CM | POA: Insufficient documentation

## 2022-05-26 DIAGNOSIS — W19XXXA Unspecified fall, initial encounter: Secondary | ICD-10-CM

## 2022-05-26 DIAGNOSIS — Z743 Need for continuous supervision: Secondary | ICD-10-CM | POA: Diagnosis not present

## 2022-05-26 LAB — CBC WITH DIFFERENTIAL/PLATELET
Abs Immature Granulocytes: 0.02 10*3/uL (ref 0.00–0.07)
Basophils Absolute: 0 10*3/uL (ref 0.0–0.1)
Basophils Relative: 0 %
Eosinophils Absolute: 0.3 10*3/uL (ref 0.0–0.5)
Eosinophils Relative: 5 %
HCT: 33.2 % — ABNORMAL LOW (ref 36.0–46.0)
Hemoglobin: 10.9 g/dL — ABNORMAL LOW (ref 12.0–15.0)
Immature Granulocytes: 0 %
Lymphocytes Relative: 29 %
Lymphs Abs: 1.9 10*3/uL (ref 0.7–4.0)
MCH: 32.2 pg (ref 26.0–34.0)
MCHC: 32.8 g/dL (ref 30.0–36.0)
MCV: 97.9 fL (ref 80.0–100.0)
Monocytes Absolute: 0.5 10*3/uL (ref 0.1–1.0)
Monocytes Relative: 8 %
Neutro Abs: 3.7 10*3/uL (ref 1.7–7.7)
Neutrophils Relative %: 58 %
Platelets: 219 10*3/uL (ref 150–400)
RBC: 3.39 MIL/uL — ABNORMAL LOW (ref 3.87–5.11)
RDW: 13.7 % (ref 11.5–15.5)
WBC: 6.5 10*3/uL (ref 4.0–10.5)
nRBC: 0 % (ref 0.0–0.2)

## 2022-05-26 LAB — BASIC METABOLIC PANEL
Anion gap: 7 (ref 5–15)
BUN: 26 mg/dL — ABNORMAL HIGH (ref 8–23)
CO2: 27 mmol/L (ref 22–32)
Calcium: 9.2 mg/dL (ref 8.9–10.3)
Chloride: 102 mmol/L (ref 98–111)
Creatinine, Ser: 0.98 mg/dL (ref 0.44–1.00)
GFR, Estimated: 53 mL/min — ABNORMAL LOW (ref >60)
Glucose, Bld: 148 mg/dL — ABNORMAL HIGH (ref 70–99)
Potassium: 3.1 mmol/L — ABNORMAL LOW (ref 3.5–5.1)
Sodium: 136 mmol/L (ref 135–145)

## 2022-05-26 MED ORDER — ACETAMINOPHEN 325 MG PO TABS
650.0000 mg | ORAL_TABLET | Freq: Once | ORAL | Status: AC
  Administered 2022-05-26: 650 mg via ORAL
  Filled 2022-05-26: qty 2

## 2022-05-26 MED ORDER — POTASSIUM CHLORIDE 20 MEQ PO PACK: 20.0000 meq | PACK | Freq: Every day | ORAL | 0 refills | Status: DC

## 2022-05-26 MED ORDER — POTASSIUM CHLORIDE 20 MEQ PO PACK
40.0000 meq | PACK | Freq: Once | ORAL | Status: AC
  Administered 2022-05-26: 40 meq via ORAL
  Filled 2022-05-26: qty 2

## 2022-05-26 MED ORDER — POTASSIUM CHLORIDE CRYS ER 20 MEQ PO TBCR: 40.0000 meq | EXTENDED_RELEASE_TABLET | Freq: Once | ORAL | Status: DC

## 2022-05-26 MED ORDER — LIDOCAINE 5 % EX PTCH
1.0000 | MEDICATED_PATCH | CUTANEOUS | Status: DC
  Administered 2022-05-26: 1 via TRANSDERMAL
  Filled 2022-05-26: qty 1

## 2022-05-26 MED ORDER — HYDROCODONE-ACETAMINOPHEN 5-325 MG PO TABS: 1.0000 | ORAL_TABLET | Freq: Three times a day (TID) | ORAL | 0 refills | Status: AC | PRN

## 2022-05-26 NOTE — ED Notes (Signed)
Pacemaker interrogation papers given to EDP

## 2022-05-26 NOTE — ED Provider Notes (Signed)
[facility] EMERGENCY DEPARTMENT Provider Note   CSN: [id number] Arrival date & time: 05/26/22  1749     History {Add pertinent medical, surgical, social history, OB history to HPI:1} Chief Complaint  Patient presents with   Back Pain    Candace Cervantes is a 88 y.o. female With a medical history of sick sinus syndrome, pacemaker device, hypertension, CAD, GERD, prediabetes.  Presents to the emergency department the chief complaint of fall and lumbar back pain.  Patient reports that 05/15/2022 she suffered a fall.  Patient states that she was taking a progresses when she started falling backwards.  Patient endorses falling on her back but denies hitting her head, consciousness, or vomiting after fall.  Patient reports that she has had lumbar back pain since her fall.  Pain has been getting progressively worse over this time.  Pain is worse with touch and certain movements.  Patient has been able to walk but states that walking causes increased pain.  Patient reports that she is not on any blood thinners.  Patient denies any chest pain, shortness of breath, abdominal pain, nausea, vomiting, numbness, weakness, saddle anesthesia, bowel/bladder dysfunction, headache, visual disturbance, facial asymmetry, dysarthria.   Back Pain Associated symptoms: no abdominal pain, no chest pain, no dysuria, no fever and no headaches        Home Medications Prior to Admission medications   Medication Sig Start Date End Date Taking? Authorizing Provider  Acetaminophen (TYLENOL 8 HOUR ARTHRITIS PAIN PO) Take 1 tablet by mouth daily. Sometimes takes 1 extra per day    [provider]  amLODipine (NORVASC) 2.5 MG tablet TAKE 1 TABLET BY MOUTH ONCE DAILY. TAKE WITH '5MG'$  TABLET. 05/08/22   Fayrene Helper, MD  amLODipine (NORVASC) 5 MG tablet TAKE 1 TABLET BY MOUTH ONCE A DAY. TAKE WITH 2.'5MG'$  TABLETS. 05/08/22   Fayrene Helper, MD  citalopram (CELEXA) 10 MG tablet TAKE ONE TABLET BY MOUTH ONCE  DAILY. 05/08/22   Fayrene Helper, MD  dorzolamide-timolol (COSOPT) 22.3-6.8 MG/ML ophthalmic solution 1 drop 2 (two) times daily. 02/07/20   [provider]  Famotidine (ACID CONTROLLER PO) Take 10 mg by mouth daily.    [provider]  hydrochlorothiazide (MICROZIDE) 12.5 MG capsule TAKE 1 CAPSULE BY MOUTH ONCE DAILY. 05/08/22   Fayrene Helper, MD  isosorbide mononitrate (IMDUR) 30 MG 24 hr tablet TAKE (1/2) TABLET BY MOUTH ONCE DAILY. 05/08/22   Fayrene Helper, MD  latanoprost (XALATAN) 0.005 % ophthalmic solution 1 drop daily. 02/04/20   [provider]  Multiple Vitamin (MULTIVITAMIN WITH MINERALS) TABS tablet Take 1 tablet by mouth daily.    [provider]  nitroGLYCERIN (NITROSTAT) 0.4 MG SL tablet DISSOLVE 1 TABLET SUBLINGUALLY AS NEEDED FOR CHEST PAIN, MAY REPEAT EVERY 5 MINUTES. AFTER 3 TABLETS CALL 911. 04/10/21   Evans Lance, MD  omeprazole (PRILOSEC) 20 MG capsule TAKE ONE CAPSULE BY MOUTH ONCE DAILY. 05/08/22   Fayrene Helper, MD  pravastatin (PRAVACHOL) 40 MG tablet TAKE ONE TABLET BY MOUTH ONCE DAILY. 05/08/22   Fayrene Helper, MD      Allergies    Patient has no known allergies.    Review of Systems   Review of Systems  Constitutional:  Negative for chills and fever.  Eyes:  Negative for visual disturbance.  Respiratory:  Negative for shortness of breath.   Cardiovascular:  Negative for chest pain.  Gastrointestinal:  Negative for abdominal pain, nausea and vomiting.  Genitourinary:  Negative for difficulty urinating and dysuria.  Musculoskeletal:  Positive for back pain. Negative for neck pain.  Skin:  Negative for color change and rash.  Neurological:  Negative for dizziness, syncope, light-headedness and headaches.  Psychiatric/Behavioral:  Negative for confusion.     Physical Exam Updated Vital Signs BP 137/65   Pulse 60   Temp 98.4 F (36.9 C) (Oral)   Resp (!) 21   Ht '5\' 11"'$  (1.803 m)   Wt 73.9 kg   SpO2 96%    BMI 22.72 kg/m  Physical Exam Vitals and nursing note reviewed.  Constitutional:      General: She is not in acute distress.    Appearance: She is not ill-appearing, toxic-appearing or diaphoretic.  HENT:     Head: Normocephalic and atraumatic. No raccoon eyes, Battle's sign, abrasion, contusion, right periorbital erythema, left periorbital erythema or laceration.  Eyes:     General: No scleral icterus.       Right eye: No discharge.        Left eye: No discharge.  Cardiovascular:     Rate and Rhythm: Normal rate.  Pulmonary:     Effort: Pulmonary effort is normal.  Abdominal:     General: Abdomen is flat. There is no distension. There are no signs of injury.     Palpations: Abdomen is soft. There is no mass or pulsatile mass.     Tenderness: There is no abdominal tenderness. There is no guarding or rebound.  Musculoskeletal:     Cervical back: No swelling, edema, deformity, erythema, signs of trauma, lacerations, rigidity, spasms, torticollis, tenderness, bony tenderness or crepitus. No pain with movement. Normal range of motion.     Thoracic back: No swelling, edema, deformity, signs of trauma, lacerations, spasms, tenderness or bony tenderness.     Lumbar back: Tenderness and bony tenderness present. No swelling, edema, deformity, lacerations or spasms.     Comments: Diffuse tenderness across lumbar spine including midline tenderness.  No deformity or step-off to cervical, thoracic, lumbar spine.  No leg length discrepancy or internal/external rotation to bilateral lower extremities.  No tenderness, bony tenderness, or deformity to bilateral upper or lower extremities.  Patient has full active range of motion to bilateral upper and lower extremities without difficulty  Skin:    General: Skin is warm and dry.  Neurological:     General: No focal deficit present.     Mental Status: She is alert.     GCS: GCS eye subscore is 4. GCS verbal subscore is 5. GCS motor subscore is 6.      Cranial Nerves: No cranial nerve deficit or dysarthria.     Comments: Patient able to lift and hold both lower extremities against gravity without difficulty.  +5 strength to dorsiflexion and plantarflexion bilaterally.  Gross sensation intact to bilateral upper and lower extremities.  Psychiatric:        Behavior: Behavior is cooperative.     ED Results / Procedures / Treatments   Labs (all labs ordered are listed, but only abnormal results are displayed) Labs Reviewed  BASIC METABOLIC PANEL  CBC WITH DIFFERENTIAL/PLATELET    EKG None  Radiology No results found.  Procedures Procedures  {Document cardiac monitor, telemetry assessment procedure when appropriate:1}  Medications Ordered in ED Medications - No data to display  ED Course/ Medical Decision Making/ A&P Clinical Course as of 05/26/22 1845  Tue May 26, 2022  1823 Per [employer], last episode was august 5th, had 7 day episode  of afib with v pacing, currently atrial paced.  [WS]    Clinical Course User Index [WS] Cristie Hem, MD                           Medical Decision Making Amount and/or Complexity of Data Reviewed Labs: ordered. Radiology: ordered.   ***  {Document critical care time when appropriate:1} {Document review of labs and clinical decision tools ie heart score, Chads2Vasc2 etc:1}  {Document your independent review of radiology images, and any outside records:1} {Document your discussion with family members, caretakers, and with consultants:1} {Document social determinants of health affecting pt's care:1} {Document your decision making why or why not admission, treatments were needed:1} Final Clinical Impression(s) / ED Diagnoses Final diagnoses:  None    Rx / DC Orders ED Discharge Orders     None

## 2022-05-26 NOTE — Discharge Instructions (Addendum)
You came to the emergency department today to be evaluated for your back pain after suffering a fall.  The CT scan of your back did not show any acute abnormalities.  Additionally have a chronic compression fracture to L1 which is unchanged after fall.  The CT scan of your head and neck were unremarkable.  Your lab work showed that your potassium is slightly decreased, please take the potassium that we have given you and follow-up with your primary care doctor for repeat evaluation.  Get help right away if: You develop new bowel or bladder control problems. You have unusual weakness or numbness in your arms or legs. You feel faint.

## 2022-05-26 NOTE — ED Triage Notes (Signed)
Pt arrived to er by [facility] after experiencing a fall a week ago. C/o back pain that is worse with walking and has progressively gotten worse over the week, pt states that fell backwards landing on her back, unsure of what caused her to fall.

## 2022-05-26 NOTE — ED Notes (Signed)
Pt ambulated in room with minimal assist with walker

## 2022-06-09 ENCOUNTER — Ambulatory Visit: Payer: Medicare Other | Attending: Internal Medicine | Admitting: Internal Medicine

## 2022-06-09 ENCOUNTER — Other Ambulatory Visit: Payer: Self-pay | Admitting: Family Medicine

## 2022-06-09 ENCOUNTER — Encounter: Payer: Self-pay | Admitting: Internal Medicine

## 2022-06-09 VITALS — BP 112/80 | HR 70 | Ht 66.5 in | Wt 158.9 lb

## 2022-06-09 DIAGNOSIS — I495 Sick sinus syndrome: Secondary | ICD-10-CM | POA: Diagnosis not present

## 2022-06-09 DIAGNOSIS — I10 Essential (primary) hypertension: Secondary | ICD-10-CM | POA: Diagnosis not present

## 2022-06-09 NOTE — Patient Instructions (Signed)
Medication Instructions:  Your physician recommends that you continue on your current medications as directed. Please refer to the Current Medication list given to you today.  *If you need a refill on your cardiac medications before your next appointment, please call your pharmacy*   Lab Work: NONE   If you have labs (blood work) drawn today and your tests are completely normal, you will receive your results only by: MyChart Message (if you have MyChart) OR A paper copy in the mail If you have any lab test that is abnormal or we need to change your treatment, we will call you to review the results.   Testing/Procedures: NONE    Follow-Up: At [facility], you and your health needs are our priority.  As part of our continuing mission to provide you with exceptional heart care, we have created designated Provider Care Teams.  These Care Teams include your primary Cardiologist (physician) and Advanced Practice Providers (APPs -  Physician Assistants and Nurse Practitioners) who all work together to provide you with the care you need, when you need it.  We recommend signing up for the patient portal called "MyChart".  Sign up information is provided on this After Visit Summary.  MyChart is used to connect with patients for Virtual Visits (Telemedicine).  Patients are able to view lab/test results, encounter notes, upcoming appointments, etc.  Non-urgent messages can be sent to your provider as well.   To learn more about what you can do with MyChart, go to [email or web address].    Your next appointment:   1 year(s)  The format for your next appointment:   In Person  Provider:   Gregg Taylor, MD    Other Instructions Thank you for choosing  [facility]!    Important Information About Sugar       

## 2022-06-09 NOTE — Progress Notes (Signed)
HPI Candace Cervantes returns today for followup. She is a pleasant 88 yo woman with a h/o HTN, brief PAF, and sinus node dysfunction s/p PPM insertion. She had a gen change out over 2 years ago. She feels well. She denies chest pain or sob. She is using a walker and despite this has had some falls. Her daughter thinks 6 in the last 5 years. She is not on systemic anti-coagulation due to her falls and only brief atrial fib. No Known Allergies   Current Outpatient Medications  Medication Sig Dispense Refill   Acetaminophen (TYLENOL 8 HOUR ARTHRITIS PAIN PO) Take 1 tablet by mouth daily. Sometimes takes 1 extra per day     amLODipine (NORVASC) 2.5 MG tablet TAKE 1 TABLET BY MOUTH ONCE DAILY. TAKE WITH '5MG'$  TABLET. 30 tablet 0   amLODipine (NORVASC) 5 MG tablet TAKE 1 TABLET BY MOUTH ONCE A DAY. TAKE WITH 2.'5MG'$  TABLETS. 30 tablet 0   citalopram (CELEXA) 10 MG tablet TAKE ONE TABLET BY MOUTH ONCE DAILY. 30 tablet 0   dorzolamide-timolol (COSOPT) 22.3-6.8 MG/ML ophthalmic solution 1 drop 2 (two) times daily.     Famotidine (ACID CONTROLLER PO) Take 10 mg by mouth daily.     hydrochlorothiazide (MICROZIDE) 12.5 MG capsule TAKE 1 CAPSULE BY MOUTH ONCE DAILY. 30 capsule 0   isosorbide mononitrate (IMDUR) 30 MG 24 hr tablet TAKE (1/2) TABLET BY MOUTH ONCE DAILY. 15 tablet 0   latanoprost (XALATAN) 0.005 % ophthalmic solution 1 drop daily.     Multiple Vitamin (MULTIVITAMIN WITH MINERALS) TABS tablet Take 1 tablet by mouth daily.     nitroGLYCERIN (NITROSTAT) 0.4 MG SL tablet DISSOLVE 1 TABLET SUBLINGUALLY AS NEEDED FOR CHEST PAIN, MAY REPEAT EVERY 5 MINUTES. AFTER 3 TABLETS CALL 911. 25 tablet 0   omeprazole (PRILOSEC) 20 MG capsule TAKE ONE CAPSULE BY MOUTH ONCE DAILY. 30 capsule 0   potassium chloride (KLOR-CON) 20 MEQ packet Take 20 mEq by mouth daily for 5 doses. 5 packet 0   pravastatin (PRAVACHOL) 40 MG tablet TAKE ONE TABLET BY MOUTH ONCE DAILY. 30 tablet 0   No current facility-administered  medications for this visit.     Past Medical History:  Diagnosis Date   Anxiety disorder    Arthritis    CAD (coronary artery disease) 2005   Nonobstructive 40% RCA lesion and normal ejection fraction at cath    Chronic headache disorder 10/09/2017   GERD (gastroesophageal reflux disease)    Hyperlipidemia    Hypertension    Osteoporosis    Pacemaker 2002   Permanent Placement   Pacemaker 2007   Generator Changed    Prediabetes    Sinoatrial node dysfunction (HCC)    SSS (sick sinus syndrome) ([place])    Zenker diverticulum    previously evaluated by Dr. Erik Obey in 6834.     ROS:   All systems reviewed and negative except as noted in the HPI.   Past Surgical History:  Procedure Laterality Date   CHOLECYSTECTOMY     COLONOSCOPY  2003   no polyps.   CYSTECTOMY     from back of the neck   ESOPHAGOGASTRODUODENOSCOPY  2008   Dr. Oneida Alar, difficulty passing scope through UES, incomplete fibrous ring at GEJ, hh, multiple benign gastri polyps   ESOPHAGOGASTRODUODENOSCOPY N/A 10/17/2013   ZENKER'S, STRICTURE: 10-12.8 MM,  NSAID GASTRITIS FG POLYPS   ESOPHAGOGASTRODUODENOSCOPY (EGD) WITH ESOPHAGEAL DILATION N/A 10/30/2013   SLF: 1. Zenkers diverticulum with a large opening at  the cricopharyngeus 2. 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 Innumerable Fundic gland polyps 4. Mild NSAID gastritis   MALONEY DILATION N/A 10/17/2013   Procedure: MALONEY DILATION;  Surgeon: Danie Binder, MD;  Location: [facility];  Service: Endoscopy;  Laterality: N/A;  with PEDS GASTROSCOPE   PACEMAKER INSERTION  2002   PACEMAKER PLACEMENT  2007   replacement    PPM GENERATOR CHANGEOUT N/A 01/15/2020   Procedure: PPM GENERATOR CHANGEOUT;  Surgeon: Evans Lance, MD;  Location: [place] [facility];  Service: Cardiovascular;  Laterality: N/A;   SAVORY DILATION N/A 10/17/2013   Procedure: SAVORY DILATION;  Surgeon: Danie Binder, MD;  Location: [facility];  Service: Endoscopy;  Laterality: N/A;  with  PEDS GASTROSCOPE   TOTAL ABDOMINAL HYSTERECTOMY  2002     Family History  Problem Relation Age of Onset   Heart disease Mother    Lung disease Sister    Diabetes Sister    Alcohol abuse Brother    Cancer Brother        possible prostate   Colon cancer Other        sibling, age 44   Heart attack Other        uncle     Social History   Socioeconomic History   Marital status: Widowed    Spouse name: Not on file   Number of children: 2   Years of education: Not on file   Highest education level: 12th grade  Occupational History   Not on file  Tobacco Use   Smoking status: Never   Smokeless tobacco: Never  Vaping Use   Vaping Use: Never used  Substance and Sexual Activity   Alcohol use: Never   Drug use: Never   Sexual activity: Not Currently  Other Topics Concern   Not on file  Social History Narrative   Lives with daughter Little Ishikawa    Right handed    Caffeine: coffee 1 cup/day   Social Determinants of Health   Financial Resource Strain: Low Risk  (09/01/2021)   Overall Financial Resource Strain (CARDIA)    Difficulty of Paying Living Expenses: Not very hard  Food Insecurity: No Food Insecurity (09/01/2021)   Hunger Vital Sign    Worried About Running Out of Food in the Last Year: Never true    Ran Out of Food in the Last Year: Never true  Transportation Needs: No Transportation Needs (07/30/2020)   PRAPARE - Hydrologist (Medical): No    Lack of Transportation (Non-Medical): No  Physical Activity: Insufficiently Active (09/01/2021)   Exercise Vital Sign    Days of Exercise per Week: 3 days    Minutes of Exercise per Session: 10 min  Stress: No Stress Concern Present (09/01/2021)   [place]    Feeling of Stress : Only a little  Social Connections: Moderately Isolated (09/01/2021)   Social Connection and Isolation Panel [NHANES]    Frequency of  Communication with Friends and Family: Three times a week    Frequency of Social Gatherings with Friends and Family: Twice a week    Attends Religious Services: More than 4 times per year    Active Member of [employer] or Organizations: No    Attends Archivist Meetings: Never    Marital Status: Widowed  Intimate Partner Violence: Not At Risk (07/30/2020)   Humiliation, Afraid, Rape, and Kick questionnaire    Fear of Current  or Ex-Partner: No    Emotionally Abused: No    Physically Abused: No    Sexually Abused: No     BP 112/80   Pulse 70   Ht 5' 6.5" (1.689 m)   Wt 158 lb 14.4 oz (72.1 kg)   SpO2 96%   BMI 25.26 kg/m   Physical Exam:  Well appearing NAD HEENT: Unremarkable Neck:  No JVD, no thyromegally Lymphatics:  No adenopathy Back:  No CVA tenderness Lungs:  Clear with no wheezes HEART:  Regular rate rhythm, no murmurs, no rubs, no clicks Abd:  soft, positive bowel sounds, no organomegally, no rebound, no guarding Ext:  2 plus pulses, no edema, no cyanosis, no clubbing Skin:  No rashes no nodules Neuro:  CN II through XII intact, motor grossly intact  DEVICE  Normal device function.  See PaceArt for details.   Assess/Plan:  1. Sinus node dysfunction - she is atrial pacing and asymptomatic.  2.  PPM - her [employer] Jude DDD PM is working normally.  3. PAF - she has had less than 1% atrial fib. We will hold off on this for now as the risk benefit does not warrant as she has had falls and will likely to continue to do so and her episodes of atrial fib have been brief. 4. HTN - her bp is well controlled.    Mikle Bosworth.D.

## 2022-06-10 ENCOUNTER — Encounter: Payer: Medicare Other | Admitting: Family Medicine

## 2022-06-25 ENCOUNTER — Encounter: Payer: Self-pay | Admitting: Podiatry

## 2022-06-25 ENCOUNTER — Ambulatory Visit: Payer: Medicare Other | Admitting: Podiatry

## 2022-06-25 DIAGNOSIS — B351 Tinea unguium: Secondary | ICD-10-CM | POA: Diagnosis not present

## 2022-06-25 DIAGNOSIS — M79675 Pain in left toe(s): Secondary | ICD-10-CM

## 2022-06-25 DIAGNOSIS — M79674 Pain in right toe(s): Secondary | ICD-10-CM

## 2022-06-25 NOTE — Progress Notes (Signed)
This patient presents to the office with chief complaint of long thick painful nails.  Patient says the nails are painful walking and wearing shoes.  This patient is unable to self treat.  This patient is unable to trim her nails since she is unable to reach her nails.  She presents to the office for preventative foot care services.  She presents to the office with her daughter.  General Appearance  Alert, conversant and in no acute stress.  Vascular  Dorsalis pedis and posterior tibial  pulses are weakly  palpable  bilaterally.  Capillary return is within normal limits  bilaterally. Temperature is within normal limits  bilaterally.  Neurologic  Senn-Weinstein monofilament wire test within normal limits  bilaterally. Muscle power within normal limits bilaterally.  Nails Thick disfigured discolored nails with subungual debris  from hallux to fifth toes bilaterally. No evidence of bacterial infection or drainage bilaterally.  Orthopedic  No limitations of motion  feet .  No crepitus or effusions noted.  No bony pathology or digital deformities noted.  Skin  normotropic skin with no porokeratosis noted bilaterally.  No signs of infections or ulcers noted.     Onychomycosis  Nails  B/L.  Pain in right toes  Pain in left toes  Debridement of nails both feet followed trimming the nails with dremel tool.    RTC 3 months.   Gardiner Barefoot DPM

## 2022-07-08 ENCOUNTER — Other Ambulatory Visit: Payer: Self-pay | Admitting: Family Medicine

## 2022-07-30 DIAGNOSIS — H6123 Impacted cerumen, bilateral: Secondary | ICD-10-CM | POA: Diagnosis not present

## 2022-08-06 ENCOUNTER — Other Ambulatory Visit: Payer: Self-pay | Admitting: Family Medicine

## 2022-08-06 ENCOUNTER — Encounter: Payer: Self-pay | Admitting: Family Medicine

## 2022-08-06 ENCOUNTER — Ambulatory Visit (INDEPENDENT_AMBULATORY_CARE_PROVIDER_SITE_OTHER): Payer: Medicare Other | Admitting: Family Medicine

## 2022-08-06 VITALS — BP 152/60 | HR 72 | Ht 66.0 in | Wt 162.1 lb

## 2022-08-06 DIAGNOSIS — E559 Vitamin D deficiency, unspecified: Secondary | ICD-10-CM

## 2022-08-06 DIAGNOSIS — R7302 Impaired glucose tolerance (oral): Secondary | ICD-10-CM

## 2022-08-06 DIAGNOSIS — Z0000 Encounter for general adult medical examination without abnormal findings: Secondary | ICD-10-CM

## 2022-08-06 DIAGNOSIS — I10 Essential (primary) hypertension: Secondary | ICD-10-CM

## 2022-08-06 DIAGNOSIS — E7849 Other hyperlipidemia: Secondary | ICD-10-CM | POA: Diagnosis not present

## 2022-08-06 DIAGNOSIS — Z23 Encounter for immunization: Secondary | ICD-10-CM | POA: Diagnosis not present

## 2022-08-06 MED ORDER — AMLODIPINE BESYLATE 10 MG PO TABS: 10.0000 mg | ORAL_TABLET | Freq: Every day | ORAL | 2 refills | Status: DC

## 2022-08-06 NOTE — Progress Notes (Signed)
    Candace Cervantes     MRN: [id number]      DOB: July 01, 1925  HPI: Patient is in for annual physical exam. Right knee pain and swelling x 2 days, was outside picking up pecans earlier that day Recent labs,  are reviewed. Immunization is reviewed , and  updated .   PE: Pleasant  female, alert  in no cardio-pulmonary distress. Afebrile. HEENT No facial trauma or asymetry. Sinuses non tender.  Extra occullar muscles intact.. External ears normal, . Neck: decreased rOM, no adenopathy,JVD or thyromegaly.No bruits.  Chest: Clear to ascultation bilaterally.No crackles or wheezes. Non tender to palpation    Cardiovascular system; Heart sounds normal,  S1 and  S2 ,  Abdomen: Soft, non tender, Musculoskeletal exam: Decreased  ROM of spine, hips , shoulders and knees. Ndeformity ,swelling or crepitus noted.in right knee No muscle wasting or atrophy.   Neurologic: Cranial nerves 2 to 12 intact. Power, tone ,sensation  normal throughout.  disturbance in gait. No tremor.  Skin: Intact, no ulceration, erythema , scaling or rash noted. Pigmentation normal throughout  Psych; Normal mood and affect. J Assessment & Plan:  Annual physical exam Annual exam as documented. m. Immunization  needs are specifically addressed at this visit.

## 2022-08-06 NOTE — Patient Instructions (Addendum)
F/u to re evaluate blood pressure SECOND  week in January, call if you need me sooner  Flu vaccine todaY  NEW HIGHER DOSE OF AMLODIPINE IS 10 MG ONE DAILY, STOP AMLODIPINE 5 MG AND AMLODIPINE 2.5 MG TABLETS  YOU NEED RSV AND TDAP VACCINES FROM YOUR PHARMACY  Fasting lipid, cmp and Egfr, hBA1C TSH and vit D 1 week before Jan appt  Careful not to fall or overuse the knees, right knee slightly swollen with arthritis  Thanks for choosing [facility], we consider it a privelige to serve you.

## 2022-08-06 NOTE — Assessment & Plan Note (Signed)
Annual exam as documented. m. Immunization  needs are specifically addressed at this visit.

## 2022-08-07 ENCOUNTER — Other Ambulatory Visit: Payer: Self-pay | Admitting: Family Medicine

## 2022-08-14 DIAGNOSIS — H6123 Impacted cerumen, bilateral: Secondary | ICD-10-CM | POA: Diagnosis not present

## 2022-08-17 ENCOUNTER — Ambulatory Visit (INDEPENDENT_AMBULATORY_CARE_PROVIDER_SITE_OTHER): Payer: Medicare Other

## 2022-08-17 DIAGNOSIS — I495 Sick sinus syndrome: Secondary | ICD-10-CM | POA: Diagnosis not present

## 2022-08-17 DIAGNOSIS — H903 Sensorineural hearing loss, bilateral: Secondary | ICD-10-CM | POA: Diagnosis not present

## 2022-08-18 LAB — CUP PACEART REMOTE DEVICE CHECK
Battery Remaining Longevity: 87 mo
Battery Remaining Percentage: 80 %
Battery Voltage: 3.01 V
Brady Statistic AP VP Percent: 1 %
Brady Statistic AP VS Percent: 64 %
Brady Statistic AS VP Percent: 1 %
Brady Statistic AS VS Percent: 35 %
Brady Statistic RA Percent Paced: 62 %
Brady Statistic RV Percent Paced: 1 %
Date Time Interrogation Session: 20231113031024
Implantable Lead Connection Status: 753985
Implantable Lead Connection Status: 753985
Implantable Lead Implant Date: 20021126
Implantable Lead Implant Date: 20021126
Implantable Lead Location: 753859
Implantable Lead Location: 753860
Implantable Pulse Generator Implant Date: 20210412
Lead Channel Impedance Value: 430 Ohm
Lead Channel Impedance Value: 490 Ohm
Lead Channel Pacing Threshold Amplitude: 0.5 V
Lead Channel Pacing Threshold Amplitude: 1 V
Lead Channel Pacing Threshold Pulse Width: 0.5 ms
Lead Channel Pacing Threshold Pulse Width: 0.9 ms
Lead Channel Sensing Intrinsic Amplitude: 1.3 mV
Lead Channel Sensing Intrinsic Amplitude: 5.2 mV
Lead Channel Setting Pacing Amplitude: 2 V
Lead Channel Setting Pacing Amplitude: 2.5 V
Lead Channel Setting Pacing Pulse Width: 0.9 ms
Lead Channel Setting Sensing Sensitivity: 2 mV
Pulse Gen Model: 2272
Pulse Gen Serial Number: 3814236

## 2022-09-07 ENCOUNTER — Other Ambulatory Visit: Payer: Self-pay | Admitting: Family Medicine

## 2022-09-14 ENCOUNTER — Ambulatory Visit (INDEPENDENT_AMBULATORY_CARE_PROVIDER_SITE_OTHER): Payer: Medicare Other

## 2022-09-14 ENCOUNTER — Telehealth: Payer: Self-pay | Admitting: Family Medicine

## 2022-09-14 DIAGNOSIS — Z0000 Encounter for general adult medical examination without abnormal findings: Secondary | ICD-10-CM | POA: Diagnosis not present

## 2022-09-14 NOTE — Telephone Encounter (Signed)
Awv complete

## 2022-09-14 NOTE — Telephone Encounter (Signed)
   Daughtercalled for  Candace Cervantes has AWV called this morning, her daughter called and lvm at 12:33 pm said Candace Cervantes was calling wrong number her mom stays at [phone]. asked for nurse to give her a call back epic message sent.

## 2022-09-14 NOTE — Progress Notes (Signed)
Subjective:   Candace Cervantes is a 88 y.o. female who presents for Medicare Annual (Subsequent) preventive examination.  I connected with  [name] on 09/14/22 by a audio enabled telemedicine application and verified that I am speaking with the correct person using two identifiers.  Patient Location: Home  Provider Location: Office/Clinic  I discussed the limitations of evaluation and management by telemedicine. The patient expressed understanding and agreed to proceed.   Review of Systems     Candace Cervantes , Thank you for taking time to come for your Medicare Wellness Visit. I appreciate your ongoing commitment to your health goals. Please review the following plan we discussed and let me know if I can assist you in the future.   These are the goals we discussed:  Goals      Activity and Exercise Increased     Evidence-based guidance:  Review current exercise levels.  Assess patient perspective on exercise or activity level, barriers to increasing activity, motivation and readiness for change.  Recommend or set healthy exercise goal based on individual tolerance.  Encourage small steps toward making change in amount of exercise or activity.  Urge reduction of sedentary activities or screen time.  Promote group activities within the community or with family or support person.  Consider referral to rehabiliation therapist for assessment and exercise/activity plan.   Notes:     Exercise 3x per week (30 min per time)     Recommend starting a routine exercise program at least 3 days a week for 30-45 minutes at a time as tolerated.       Prevent falls     Will do the chair exercises to help strengthen her legs and take her time and always use walker when ambulating         This is a list of the screening recommended for you and due dates:  Health Maintenance  Topic Date Due   DTaP/Tdap/Td vaccine (3 - Td or Tdap) 08/17/2021   COVID-19 Vaccine (5 - 2023-24 season) 09/16/2022    Medicare Annual Wellness Visit  09/15/2023   Pneumonia Vaccine  Completed   Flu Shot  Completed   DEXA scan (bone density measurement)  Completed   Zoster (Shingles) Vaccine  Completed   HPV Vaccine  Aged Out          Objective:    There were no vitals filed for this visit. There is no height or weight on file to calculate BMI.     05/26/2022    5:53 PM 09/01/2021   10:24 AM 09/09/2020   12:19 PM 07/30/2020   11:08 AM 01/15/2020   12:01 PM 10/27/2018    5:18 PM 07/25/2018   10:53 AM  Advanced Directives  Does Patient Have a Medical Advance Directive? No No Yes No Yes No No  Type of Advance Directive   Living will;Healthcare Power of [place] in Chart?   No - copy requested      Would patient like information on creating a medical advance directive?  Yes (ED - Information included in AVS)  No - Patient declined  No - Patient declined Yes (ED - Information included in AVS)    Current Medications (verified) Outpatient Encounter Medications as of 09/14/2022  Medication Sig   Acetaminophen (TYLENOL 8 HOUR ARTHRITIS PAIN PO) Take 1 tablet by mouth daily. Sometimes takes 1 extra per day   amLODipine (NORVASC)  10 MG tablet Take 1 tablet (10 mg total) by mouth daily.   citalopram (CELEXA) 10 MG tablet TAKE ONE TABLET BY MOUTH ONCE DAILY.   dorzolamide-timolol (COSOPT) 22.3-6.8 MG/ML ophthalmic solution 1 drop 2 (two) times daily.   Famotidine (ACID CONTROLLER PO) Take 10 mg by mouth daily.   hydrochlorothiazide (MICROZIDE) 12.5 MG capsule TAKE 1 CAPSULE BY MOUTH ONCE DAILY.   isosorbide mononitrate (IMDUR) 30 MG 24 hr tablet TAKE (1/2) TABLET BY MOUTH ONCE DAILY.   latanoprost (XALATAN) 0.005 % ophthalmic solution 1 drop daily.   Multiple Vitamin (MULTIVITAMIN WITH MINERALS) TABS tablet Take 1 tablet by mouth daily.   nitroGLYCERIN (NITROSTAT) 0.4 MG SL tablet DISSOLVE 1 TABLET SUBLINGUALLY AS NEEDED FOR CHEST  PAIN, MAY REPEAT EVERY 5 MINUTES. AFTER 3 TABLETS CALL 911.   omeprazole (PRILOSEC) 20 MG capsule TAKE ONE CAPSULE BY MOUTH ONCE DAILY.   potassium chloride (KLOR-CON) 20 MEQ packet Take 20 mEq by mouth daily for 5 doses.   pravastatin (PRAVACHOL) 40 MG tablet TAKE ONE TABLET BY MOUTH ONCE DAILY.   No facility-administered encounter medications on file as of 09/14/2022.    Allergies (verified) Patient has no known allergies.   History: Past Medical History:  Diagnosis Date   Anxiety disorder    Arthritis    CAD (coronary artery disease) 2005   Nonobstructive 40% RCA lesion and normal ejection fraction at cath    Chronic headache disorder 10/09/2017   GERD (gastroesophageal reflux disease)    Hyperlipidemia    Hypertension    Osteoporosis    Pacemaker 2002   Permanent Placement   Pacemaker 2007   Generator Changed    Prediabetes    Sinoatrial node dysfunction (HCC)    SSS (sick sinus syndrome) ([place])    Zenker diverticulum    previously evaluated by Dr. Erik Obey in [id number].    Past Surgical History:  Procedure Laterality Date   CHOLECYSTECTOMY     COLONOSCOPY  2003   no polyps.   CYSTECTOMY     from back of the neck   ESOPHAGOGASTRODUODENOSCOPY  2008   Dr. Oneida Alar, difficulty passing scope through UES, incomplete fibrous ring at GEJ, hh, multiple benign gastri polyps   ESOPHAGOGASTRODUODENOSCOPY N/A 10/17/2013   ZENKER'S, STRICTURE: 10-12.8 MM,  NSAID GASTRITIS FG POLYPS   ESOPHAGOGASTRODUODENOSCOPY (EGD) WITH ESOPHAGEAL DILATION N/A 10/30/2013   SLF: 1. Zenkers diverticulum with a large opening at the cricopharyngeus 2. 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 Innumerable Fundic gland polyps 4. Mild NSAID gastritis   MALONEY DILATION N/A 10/17/2013   Procedure: MALONEY DILATION;  Surgeon: Danie Binder, MD;  Location: [facility];  Service: Endoscopy;  Laterality: N/A;  with PEDS GASTROSCOPE   PACEMAKER INSERTION  2002   PACEMAKER PLACEMENT  2007   replacement    PPM GENERATOR  CHANGEOUT N/A 01/15/2020   Procedure: PPM GENERATOR CHANGEOUT;  Surgeon: Evans Lance, MD;  Location: [facility];  Service: Cardiovascular;  Laterality: N/A;   SAVORY DILATION N/A 10/17/2013   Procedure: SAVORY DILATION;  Surgeon: Danie Binder, MD;  Location: [facility];  Service: Endoscopy;  Laterality: N/A;  with PEDS GASTROSCOPE   TOTAL ABDOMINAL HYSTERECTOMY  2002   Family History  Problem Relation Age of Onset   Heart disease Mother    Lung disease Sister    Diabetes Sister    Alcohol abuse Brother    Cancer Brother        possible prostate   Colon cancer Other  sibling, age 59   Heart attack Other        uncle   Social History   Socioeconomic History   Marital status: Widowed    Spouse name: Not on file   Number of children: 2   Years of education: Not on file   Highest education level: 12th grade  Occupational History   Not on file  Tobacco Use   Smoking status: Never   Smokeless tobacco: Never  Vaping Use   Vaping Use: Never used  Substance and Sexual Activity   Alcohol use: Never   Drug use: Never   Sexual activity: Not Currently  Other Topics Concern   Not on file  Social History Narrative   Lives with daughter Little Ishikawa    Right handed    Caffeine: coffee 1 cup/day   Social Determinants of Health   Financial Resource Strain: Low Risk  (09/01/2021)   Overall Financial Resource Strain (CARDIA)    Difficulty of Paying Living Expenses: Not very hard  Food Insecurity: No Food Insecurity (09/01/2021)   Hunger Vital Sign    Worried About Running Out of Food in the Last Year: Never true    Ran Out of Food in the Last Year: Never true  Transportation Needs: No Transportation Needs (07/30/2020)   PRAPARE - Hydrologist (Medical): No    Lack of Transportation (Non-Medical): No  Physical Activity: Insufficiently Active (09/01/2021)   Exercise Vital Sign    Days of Exercise per Week: 3 days    Minutes of  Exercise per Session: 10 min  Stress: No Stress Concern Present (09/01/2021)   [place]    Feeling of Stress : Only a little  Social Connections: Moderately Isolated (09/01/2021)   Social Connection and Isolation Panel [NHANES]    Frequency of Communication with Friends and Family: Three times a week    Frequency of Social Gatherings with Friends and Family: Twice a week    Attends Religious Services: More than 4 times per year    Active Member of [employer] or Organizations: No    Attends Archivist Meetings: Never    Marital Status: Widowed    Tobacco Counseling Counseling given: Not Answered   Clinical Intake:  Pre-visit preparation completed: Yes        BMI - recorded: 26.17     Diabetic? No         Activities of Daily Living     No data to display           Patient Care Team: Fayrene Helper, MD as PCP - General Lovena Le Champ Mungo, MD as PCP - Cardiology (Cardiology) Lendon Colonel, NP as Nurse Practitioner (Nurse Practitioner) Lattie Haw, Cristopher Estimable, MD (Inactive) as Attending Physician (Cardiology) Danie Binder, MD (Inactive) as Consulting Physician (Gastroenterology) Rutherford Guys, MD as Consulting Physician (Ophthalmology) Allyn Kenner, MD as Consulting Physician (Dermatology)  Indicate any recent Medical Services you may have received from other than [facility] providers in the past year (date may be approximate).     Assessment:   This is a routine wellness examination for Candace Cervantes.  Hearing/Vision screen No results found.  Dietary issues and exercise activities discussed:     Goals Addressed   None   Depression Screen    08/06/2022    2:56 PM 09/01/2021   10:23 AM 12/12/2020    2:10 PM 08/15/2020    4:03 PM 07/30/2020  11:10 AM 07/30/2020   11:06 AM 06/12/2020    9:27 AM  PHQ 2/9 Scores  PHQ - 2 Score 0 0 0 1 1 0 0    Fall Risk    08/06/2022    2:56 PM  04/23/2022    2:28 PM 03/04/2022    1:07 PM 10/16/2021    1:12 PM 09/01/2021   10:11 AM  Fall Risk   Falls in the past year? 1 0 0 1 1  Number falls in past yr: 0 0 0 0 0  Injury with Fall? 0 0 0 0 0  Risk for fall due to : Impaired balance/gait      Follow up Falls evaluation completed        FALL RISK PREVENTION PERTAINING TO THE HOME:  Any stairs in or around the home? Yes  If so, are there any without handrails? No  Home free of loose throw rugs in walkways, pet beds, electrical cords, etc? Yes  Adequate lighting in your home to reduce risk of falls? Yes   ASSISTIVE DEVICES UTILIZED TO PREVENT FALLS:  Life alert? Yes  Use of a cane, walker or w/c? Yes  Grab bars in the bathroom? Yes  Shower chair or bench in shower? Yes  Elevated toilet seat or a handicapped toilet? Yes     Cognitive Function:    01/25/2018    4:16 PM  MMSE - Mini Mental State Exam  Orientation to time 5  Orientation to Place 5  Registration 3  Attention/ Calculation 5  Recall 1  Language- name 2 objects 2  Language- repeat 1  Language- follow 3 step command 3  Language- read & follow direction 1  Write a sentence 1  Copy design 0  Total score 27        09/01/2021   10:22 AM 07/30/2020   11:15 AM 07/27/2019    9:07 AM 07/25/2018   10:57 AM 07/12/2017    9:44 AM  6CIT Screen  What Year? 0 points 4 points 0 points 0 points 0 points  What month? 0 points 0 points 0 points 0 points 0 points  What time? 0 points 0 points 0 points 0 points 0 points  Count back from 20 0 points 0 points 0 points 0 points 0 points  Months in reverse 4 points 4 points 0 points 2 points 0 points  Repeat phrase 8 points 2 points 0 points 4 points 2 points  Total Score 12 points 10 points 0 points 6 points 2 points    Immunizations Immunization History  Administered Date(s) Administered   Fluad Quad(high Dose 65+) 07/06/2019, 06/12/2020, 06/26/2021, 08/06/2022   H1N1 09/20/2008   Influenza Split 08/11/2011,  06/22/2012   Influenza Whole 06/30/2007, 07/08/2009, 07/09/2010   Influenza,inj,Quad PF,6+ Mos 06/20/2013, 05/28/2014, 06/13/2015, 06/02/2016, 06/14/2017, 06/09/2018   Moderna Sars-Covid-2 Vaccination 10/23/2019, 11/20/2019, 08/08/2020, 07/22/2022   Pneumococcal Conjugate-13 04/03/2014   Pneumococcal Polysaccharide-23 09/04/2003   Td 02/20/2004   Tdap 08/18/2011   Zoster Recombinat (Shingrix) 07/25/2021, 10/14/2021   Zoster, Live 05/24/2008    TDAP status: Up to date  Flu Vaccine status: Up to date  Pneumococcal vaccine status: Up to date  Covid-19 vaccine status: Completed vaccines  Qualifies for Shingles Vaccine? Yes   Zostavax completed No   Shingrix Completed?: No.    Education has been provided regarding the importance of this vaccine. Patient has been advised to call insurance company to determine out of pocket expense if they have not yet received  this vaccine. Advised may also receive vaccine at local pharmacy or Health Dept. Verbalized acceptance and understanding.  Screening Tests Health Maintenance  Topic Date Due   DTaP/Tdap/Td (3 - Td or Tdap) 08/17/2021   COVID-19 Vaccine (5 - 2023-24 season) 09/16/2022   Medicare Annual Wellness (AWV)  09/15/2023   Pneumonia Vaccine 38+ Years old  Completed   INFLUENZA VACCINE  Completed   DEXA SCAN  Completed   Zoster Vaccines- Shingrix  Completed   HPV VACCINES  Aged Out    Health Maintenance  Health Maintenance Due  Topic Date Due   DTaP/Tdap/Td (3 - Td or Tdap) 08/17/2021   COVID-19 Vaccine (5 - 2023-24 season) 09/16/2022    Colorectal cancer screening: No longer required.   Mammogram status: No longer required due to age.  Bone Density status: Completed 02/19/2014. Results reflect: Bone density results: OSTEOPENIA. Repeat every 5 years.  Lung Cancer Screening: (Low Dose CT Chest recommended if Age 47-80 years, 30 pack-year currently smoking OR have quit w/in 15years.) does not qualify.   Lung Cancer Screening  Referral: NA  Additional Screening:  Hepatitis C Screening: does not qualify; Completed   Vision Screening: Recommended annual ophthalmology exams for early detection of glaucoma and other disorders of the eye. Is the patient up to date with their annual eye exam?  Yes  Who is the provider or what is the name of the office in which the patient attends annual eye exams? [place]   Dental Screening: Recommended annual dental exams for proper oral hygiene  Community Resource Referral / Chronic Care Management: CRR required this visit?  No   CCM required this visit?  No      Plan:     I have personally reviewed and noted the following in the patient's chart:   Medical and social history Use of alcohol, tobacco or illicit drugs  Current medications and supplements including opioid prescriptions. Patient is not currently taking opioid prescriptions. Functional ability and status Nutritional status Physical activity Advanced directives List of other physicians Hospitalizations, surgeries, and ER visits in previous 12 months Vitals Screenings to include cognitive, depression, and falls Referrals and appointments  In addition, I have reviewed and discussed with patient certain preventive protocols, quality metrics, and best practice recommendations. A written personalized care plan for preventive services as well as general preventive health recommendations were provided to patient.     Johny Drilling, [place]   09/14/2022   Nurse Notes:  Candace Cervantes , Thank you for taking time to come for your Medicare Wellness Visit. I appreciate your ongoing commitment to your health goals. Please review the following plan we discussed and let me know if I can assist you in the future.   These are the goals we discussed:  Goals      Activity and Exercise Increased     Evidence-based guidance:  Review current exercise levels.  Assess patient perspective on exercise or activity level,  barriers to increasing activity, motivation and readiness for change.  Recommend or set healthy exercise goal based on individual tolerance.  Encourage small steps toward making change in amount of exercise or activity.  Urge reduction of sedentary activities or screen time.  Promote group activities within the community or with family or support person.  Consider referral to rehabiliation therapist for assessment and exercise/activity plan.   Notes:     Exercise 3x per week (30 min per time)     Recommend starting a routine exercise program at least 3 days  a week for 30-45 minutes at a time as tolerated.       Prevent falls     Will do the chair exercises to help strengthen her legs and take her time and always use walker when ambulating         This is a list of the screening recommended for you and due dates:  Health Maintenance  Topic Date Due   DTaP/Tdap/Td vaccine (3 - Td or Tdap) 08/17/2021   COVID-19 Vaccine (5 - 2023-24 season) 09/16/2022   Medicare Annual Wellness Visit  09/15/2023   Pneumonia Vaccine  Completed   Flu Shot  Completed   DEXA scan (bone density measurement)  Completed   Zoster (Shingles) Vaccine  Completed   HPV Vaccine  Aged Out

## 2022-09-14 NOTE — Patient Instructions (Signed)
[name] , Thank you for taking time to come for your Medicare Wellness Visit. I appreciate your ongoing commitment to your health goals. Please review the following plan we discussed and let me know if I can assist you in the future.   Screening recommendations/referrals: Recommended yearly ophthalmology/optometry visit for glaucoma screening and checkup Recommended yearly dental visit for hygiene and checkup  Vaccinations: Influenza vaccine: Completed Pneumococcal vaccine: Completed Tdap vaccine: Due Shingles vaccine: Completed    Advanced directives: copy requested  Conditions/risks identified: falls, hypertension  Next appointment: 1 year   Preventive Care 86 Years and Older, Female Preventive care refers to lifestyle choices and visits with your health care provider that can promote health and wellness. What does preventive care include? A yearly physical exam. This is also called an annual well check. Dental exams once or twice a year. Routine eye exams. Ask your health care provider how often you should have your eyes checked. Personal lifestyle choices, including: Daily care of your teeth and gums. Regular physical activity. Eating a healthy diet. Avoiding tobacco and drug use. Limiting alcohol use. Practicing safe sex. Taking low-dose aspirin every day. Taking vitamin and mineral supplements as recommended by your health care provider. What happens during an annual well check? The services and screenings done by your health care provider during your annual well check will depend on your age, overall health, lifestyle risk factors, and family history of disease. Counseling  Your health care provider may ask you questions about your: Alcohol use. Tobacco use. Drug use. Emotional well-being. Home and relationship well-being. Sexual activity. Eating habits. History of falls. Memory and ability to understand (cognition). Work and work Statistician. Reproductive  health. Screening  You may have the following tests or measurements: Height, weight, and BMI. Blood pressure. Lipid and cholesterol levels. These may be checked every 5 years, or more frequently if you are over 86 years old. Skin check. Lung cancer screening. You may have this screening every year starting at age 86 if you have a 30-pack-year history of smoking and currently smoke or have quit within the past 15 years. Fecal occult blood test (FOBT) of the stool. You may have this test every year starting at age 86. Flexible sigmoidoscopy or colonoscopy. You may have a sigmoidoscopy every 5 years or a colonoscopy every 10 years starting at age 86. Hepatitis C blood test. Hepatitis B blood test. Sexually transmitted disease (STD) testing. Diabetes screening. This is done by checking your blood sugar (glucose) after you have not eaten for a while (fasting). You may have this done every 1-3 years. Bone density scan. This is done to screen for osteoporosis. You may have this done starting at age 86. Mammogram. This may be done every 1-2 years. Talk to your health care provider about how often you should have regular mammograms. Talk with your health care provider about your test results, treatment options, and if necessary, the need for more tests. Vaccines  Your health care provider may recommend certain vaccines, such as: Influenza vaccine. This is recommended every year. Tetanus, diphtheria, and acellular pertussis (Tdap, Td) vaccine. You may need a Td booster every 10 years. Zoster vaccine. You may need this after age 86. Pneumococcal 13-valent conjugate (PCV13) vaccine. One dose is recommended after age 86. Pneumococcal polysaccharide (PPSV23) vaccine. One dose is recommended after age 86. Talk to your health care provider about which screenings and vaccines you need and how often you need them. This information is not intended to replace advice  given to you by your health care provider.  Make sure you discuss any questions you have with your health care provider. Document Released: 10/18/2015 Document Revised: 06/10/2016 Document Reviewed: 07/23/2015 Elsevier Interactive Patient Education  2017 [place] Prevention in the Home Falls can cause injuries. They can happen to people of all ages. There are many things you can do to make your home safe and to help prevent falls. What can I do on the outside of my home? Regularly fix the edges of walkways and driveways and fix any cracks. Remove anything that might make you trip as you walk through a door, such as a raised step or threshold. Trim any bushes or trees on the path to your home. Use bright outdoor lighting. Clear any walking paths of anything that might make someone trip, such as rocks or tools. Regularly check to see if handrails are loose or broken. Make sure that both sides of any steps have handrails. Any raised decks and porches should have guardrails on the edges. Have any leaves, snow, or ice cleared regularly. Use sand or salt on walking paths during winter. Clean up any spills in your garage right away. This includes oil or grease spills. What can I do in the bathroom? Use night lights. Install grab bars by the toilet and in the tub and shower. Do not use towel bars as grab bars. Use non-skid mats or decals in the tub or shower. If you need to sit down in the shower, use a plastic, non-slip stool. Keep the floor dry. Clean up any water that spills on the floor as soon as it happens. Remove soap buildup in the tub or shower regularly. Attach bath mats securely with double-sided non-slip rug tape. Do not have throw rugs and other things on the floor that can make you trip. What can I do in the bedroom? Use night lights. Make sure that you have a light by your bed that is easy to reach. Do not use any sheets or blankets that are too big for your bed. They should not hang down onto the floor. Have a  firm chair that has side arms. You can use this for support while you get dressed. Do not have throw rugs and other things on the floor that can make you trip. What can I do in the kitchen? Clean up any spills right away. Avoid walking on wet floors. Keep items that you use a lot in easy-to-reach places. If you need to reach something above you, use a strong step stool that has a grab bar. Keep electrical cords out of the way. Do not use floor polish or wax that makes floors slippery. If you must use wax, use non-skid floor wax. Do not have throw rugs and other things on the floor that can make you trip. What can I do with my stairs? Do not leave any items on the stairs. Make sure that there are handrails on both sides of the stairs and use them. Fix handrails that are broken or loose. Make sure that handrails are as long as the stairways. Check any carpeting to make sure that it is firmly attached to the stairs. Fix any carpet that is loose or worn. Avoid having throw rugs at the top or bottom of the stairs. If you do have throw rugs, attach them to the floor with carpet tape. Make sure that you have a light switch at the top of the stairs and the bottom of  the stairs. If you do not have them, ask someone to add them for you. What else can I do to help prevent falls? Wear shoes that: Do not have high heels. Have rubber bottoms. Are comfortable and fit you well. Are closed at the toe. Do not wear sandals. If you use a stepladder: Make sure that it is fully opened. Do not climb a closed stepladder. Make sure that both sides of the stepladder are locked into place. Ask someone to hold it for you, if possible. Clearly mark and make sure that you can see: Any grab bars or handrails. First and last steps. Where the edge of each step is. Use tools that help you move around (mobility aids) if they are needed. These include: Canes. Walkers. Scooters. Crutches. Turn on the lights when you  go into a dark area. Replace any light bulbs as soon as they burn out. Set up your furniture so you have a clear path. Avoid moving your furniture around. If any of your floors are uneven, fix them. If there are any pets around you, be aware of where they are. Review your medicines with your doctor. Some medicines can make you feel dizzy. This can increase your chance of falling. Ask your doctor what other things that you can do to help prevent falls. This information is not intended to replace advice given to you by your health care provider. Make sure you discuss any questions you have with your health care provider. Document Released: 07/18/2009 Document Revised: 02/27/2016 Document Reviewed: 10/26/2014 Elsevier Interactive Patient Education  2017 [employer].

## 2022-09-24 ENCOUNTER — Ambulatory Visit (INDEPENDENT_AMBULATORY_CARE_PROVIDER_SITE_OTHER): Payer: Medicare Other | Admitting: Podiatry

## 2022-09-24 ENCOUNTER — Encounter: Payer: Self-pay | Admitting: Podiatry

## 2022-09-24 DIAGNOSIS — B351 Tinea unguium: Secondary | ICD-10-CM | POA: Diagnosis not present

## 2022-09-24 DIAGNOSIS — M79674 Pain in right toe(s): Secondary | ICD-10-CM | POA: Diagnosis not present

## 2022-09-24 DIAGNOSIS — M79675 Pain in left toe(s): Secondary | ICD-10-CM | POA: Diagnosis not present

## 2022-09-24 NOTE — Progress Notes (Signed)
This patient presents to the office with chief complaint of long thick painful nails.  Patient says the nails are painful walking and wearing shoes.  This patient is unable to self treat.  This patient is unable to trim her nails since she is unable to reach her nails.  She presents to the office for preventative foot care services.  She presents to the office with her daughter in a wheelchair.  General Appearance  Alert, conversant and in no acute stress.  Vascular  Dorsalis pedis and posterior tibial  pulses are weakly  palpable  bilaterally.  Capillary return is within normal limits  bilaterally. Temperature is within normal limits  bilaterally.  Neurologic  Senn-Weinstein monofilament wire test within normal limits  bilaterally. Muscle power within normal limits bilaterally.  Nails Thick disfigured discolored nails with subungual debris  from hallux to fifth toes bilaterally. No evidence of bacterial infection or drainage bilaterally.  Orthopedic  No limitations of motion  feet .  No crepitus or effusions noted.  No bony pathology or digital deformities noted.  Skin  normotropic skin with no porokeratosis noted bilaterally.  No signs of infections or ulcers noted.     Onychomycosis  Nails  B/L.  Pain in right toes  Pain in left toes  Debridement of nails both feet followed trimming the nails with dremel tool.    RTC 10 weeks    Gardiner Barefoot DPM

## 2022-09-29 NOTE — Progress Notes (Signed)
Remote pacemaker transmission.   

## 2022-10-07 ENCOUNTER — Other Ambulatory Visit: Payer: Self-pay | Admitting: Family Medicine

## 2022-10-09 ENCOUNTER — Telehealth: Payer: Self-pay | Admitting: Family Medicine

## 2022-10-09 DIAGNOSIS — E559 Vitamin D deficiency, unspecified: Secondary | ICD-10-CM | POA: Diagnosis not present

## 2022-10-09 DIAGNOSIS — R7302 Impaired glucose tolerance (oral): Secondary | ICD-10-CM | POA: Diagnosis not present

## 2022-10-09 DIAGNOSIS — E7849 Other hyperlipidemia: Secondary | ICD-10-CM | POA: Diagnosis not present

## 2022-10-09 DIAGNOSIS — I10 Essential (primary) hypertension: Secondary | ICD-10-CM | POA: Diagnosis not present

## 2022-10-09 MED ORDER — LORATADINE 10 MG PO TABS: 10.0000 mg | ORAL_TABLET | Freq: Every day | ORAL | 1 refills | Status: AC | PRN

## 2022-10-09 NOTE — Telephone Encounter (Signed)
Spoke with patient's daughter.

## 2022-10-09 NOTE — Telephone Encounter (Signed)
Candace Cervantes said her mom has alot of sneezing right now.  She was around her sister that was sick with running nose and congestion, but right now just a lot of sneezing and asking what can patient take OTC that will go along with the medication that patient is already on.  Return Candace Cervantes # [phone] if no answer leave detailed message on voicemail.

## 2022-10-10 LAB — TSH: TSH: 2.49 u[IU]/mL (ref 0.450–4.500)

## 2022-10-10 LAB — CMP14+EGFR
ALT: 22 IU/L (ref 0–32)
AST: 30 IU/L (ref 0–40)
Albumin/Globulin Ratio: 1.5 (ref 1.2–2.2)
Albumin: 4.3 g/dL (ref 3.6–4.6)
Alkaline Phosphatase: 71 IU/L (ref 44–121)
BUN/Creatinine Ratio: 18 (ref 12–28)
BUN: 16 mg/dL (ref 10–36)
Bilirubin Total: 0.3 mg/dL (ref 0.0–1.2)
CO2: 26 mmol/L (ref 20–29)
Calcium: 9.7 mg/dL (ref 8.7–10.3)
Chloride: 101 mmol/L (ref 96–106)
Creatinine, Ser: 0.91 mg/dL (ref 0.57–1.00)
Globulin, Total: 2.8 g/dL (ref 1.5–4.5)
Glucose: 95 mg/dL (ref 70–99)
Potassium: 3.3 mmol/L — ABNORMAL LOW (ref 3.5–5.2)
Sodium: 142 mmol/L (ref 134–144)
Total Protein: 7.1 g/dL (ref 6.0–8.5)
eGFR: 58 mL/min/{1.73_m2} — ABNORMAL LOW (ref >59)

## 2022-10-10 LAB — VITAMIN D 25 HYDROXY (VIT D DEFICIENCY, FRACTURES): Vit D, 25-Hydroxy: 44.1 ng/mL (ref 30.0–100.0)

## 2022-10-10 LAB — HEMOGLOBIN A1C
Est. average glucose Bld gHb Est-mCnc: 123 mg/dL
Hgb A1c MFr Bld: 5.9 % — ABNORMAL HIGH (ref 4.8–5.6)

## 2022-10-10 LAB — LIPID PANEL
Chol/HDL Ratio: 2.3 ratio (ref 0.0–4.4)
Cholesterol, Total: 143 mg/dL (ref 100–199)
HDL: 61 mg/dL (ref >39)
LDL Chol Calc (NIH): 70 mg/dL (ref 0–99)
Triglycerides: 55 mg/dL (ref 0–149)
VLDL Cholesterol Cal: 12 mg/dL (ref 5–40)

## 2022-10-15 ENCOUNTER — Encounter: Payer: Self-pay | Admitting: Family Medicine

## 2022-10-15 ENCOUNTER — Ambulatory Visit (INDEPENDENT_AMBULATORY_CARE_PROVIDER_SITE_OTHER): Payer: Medicare Other | Admitting: Family Medicine

## 2022-10-15 VITALS — BP 137/62 | HR 61 | Ht 66.0 in | Wt 160.0 lb

## 2022-10-15 DIAGNOSIS — Z6379 Other stressful life events affecting family and household: Secondary | ICD-10-CM | POA: Diagnosis not present

## 2022-10-15 DIAGNOSIS — K219 Gastro-esophageal reflux disease without esophagitis: Secondary | ICD-10-CM | POA: Diagnosis not present

## 2022-10-15 DIAGNOSIS — R0989 Other specified symptoms and signs involving the circulatory and respiratory systems: Secondary | ICD-10-CM | POA: Diagnosis not present

## 2022-10-15 DIAGNOSIS — J309 Allergic rhinitis, unspecified: Secondary | ICD-10-CM | POA: Diagnosis not present

## 2022-10-15 DIAGNOSIS — I10 Essential (primary) hypertension: Secondary | ICD-10-CM | POA: Diagnosis not present

## 2022-10-15 DIAGNOSIS — R2681 Unsteadiness on feet: Secondary | ICD-10-CM | POA: Diagnosis not present

## 2022-10-15 DIAGNOSIS — L040 Acute lymphadenitis of face, head and neck: Secondary | ICD-10-CM

## 2022-10-15 DIAGNOSIS — F411 Generalized anxiety disorder: Secondary | ICD-10-CM

## 2022-10-15 MED ORDER — AZITHROMYCIN 250 MG PO TABS: ORAL_TABLET | ORAL | 0 refills | Status: AC

## 2022-10-15 MED ORDER — POTASSIUM CHLORIDE CRYS ER 20 MEQ PO TBCR: 20.0000 meq | EXTENDED_RELEASE_TABLET | Freq: Every day | ORAL | 5 refills | Status: DC

## 2022-10-15 NOTE — Assessment & Plan Note (Signed)
Daughter and son in law who are around her recovering from respiratory illness, excess sneezing and coughing , no masks, pt and daughter concerned that Ms Manka may have " picked iup something" Test for viral resp illness Encourage masking

## 2022-10-15 NOTE — Patient Instructions (Signed)
F/u in 4.5 month, call if you need me sooner   Azithromycin is prescribed  Excellent labs and blood pressure  Start potassium 20 meq one daily  RSV, covid and flu tests  Careful not to fall  Thanks for choosing [facility], we consider it a privelige to serve you.

## 2022-10-15 NOTE — Assessment & Plan Note (Signed)
Home safety reviewed 

## 2022-10-15 NOTE — Assessment & Plan Note (Signed)
Controlled, no change in medication  

## 2022-10-15 NOTE — Assessment & Plan Note (Signed)
Z pack prescribed 

## 2022-10-15 NOTE — Progress Notes (Signed)
   Candace Cervantes     MRN: [id number]      DOB: 01-07-1925   HPI Candace Cervantes is here with a 3 day h/o , started 10/12/2022, feeling under the weather, states she has a cold, family members have been sneezing and coughing around her ovr past 1 week and she and her daughter are concerned se may be ill. C/o tender neck glands on left, ore throat and excess sneezing, head congestion with some cough No documented fever , often feels chill States cold has settled I her left hip, no recent trauma ROS . Denies chest pains, palpitations and leg swelling Denies abdominal pain, nausea, vomiting,diarrhea or constipation.   Denies dysuria, frequency, hesitancy or incontinence. Chronic joint pain, swelling and limitation in mobility.Increased pai in left hip Denies headaches, seizures, numbness, or tingling. Denies depression, anxiety or insomnia. Denies skin break down or rash.   PE  BP 137/62 (BP Location: Right Arm, Patient Position: Sitting, Cuff Size: Normal)   Pulse 61   Ht '5\' 6"'$  (1.676 m)   Wt 160 lb (72.6 kg)   SpO2 94%   BMI 25.82 kg/m   Patient alert and oriented and in no cardiopulmonary distress.  HEENT: No facial asymmetry, EOMI,     Neck decreased ROM, tender left anterior cervical nodes  Chest: Clear to auscultation bilaterally.  CVS: S1, S2 no murmurs, no S3.  ABD: Soft non tender.   Ext: No edema  Candace: markedly decreased  ROM spine, shoulders, hips and knees.  Skin: Intact, no ulcerations or rash noted.  Psych: Good eye contact, normal affect. Memory impaired  not anxious or depressed appearing.  CNS: CN 2-12 intact, power,  normal throughout.no focal deficits noted.   Assessment & Plan  Sickness in family Daughter and son in law who are around her recovering from respiratory illness, excess sneezing and coughing , no masks, pt and daughter concerned that Candace Cervantes may have " picked iup something" Test for viral resp illness Encourage masking  Unsteady gait Home  safety reviewed  Essential hypertension Controlled, no change in medication   Acute cervical adenitis Z pack prescribed  Respiratory symptoms Swab for viral illness  GAD (generalized anxiety disorder) Controlled, no change in medication   GERD Controlled, no change in medication

## 2022-10-15 NOTE — Assessment & Plan Note (Signed)
Swab for viral illness

## 2022-10-17 LAB — COVID-19, FLU A+B AND RSV
Influenza A, NAA: NOT DETECTED
Influenza B, NAA: NOT DETECTED
RSV, NAA: NOT DETECTED
SARS-CoV-2, NAA: NOT DETECTED

## 2022-10-26 ENCOUNTER — Encounter: Payer: Self-pay | Admitting: Podiatry

## 2022-10-29 DIAGNOSIS — H04123 Dry eye syndrome of bilateral lacrimal glands: Secondary | ICD-10-CM | POA: Diagnosis not present

## 2022-10-29 DIAGNOSIS — H401134 Primary open-angle glaucoma, bilateral, indeterminate stage: Secondary | ICD-10-CM | POA: Diagnosis not present

## 2022-10-29 DIAGNOSIS — Z961 Presence of intraocular lens: Secondary | ICD-10-CM | POA: Diagnosis not present

## 2022-10-29 DIAGNOSIS — H02834 Dermatochalasis of left upper eyelid: Secondary | ICD-10-CM | POA: Diagnosis not present

## 2022-10-29 DIAGNOSIS — H02831 Dermatochalasis of right upper eyelid: Secondary | ICD-10-CM | POA: Diagnosis not present

## 2022-11-02 ENCOUNTER — Other Ambulatory Visit: Payer: Self-pay | Admitting: Family Medicine

## 2022-11-16 ENCOUNTER — Ambulatory Visit: Payer: Medicare Other

## 2022-11-16 DIAGNOSIS — I495 Sick sinus syndrome: Secondary | ICD-10-CM

## 2022-11-17 LAB — CUP PACEART REMOTE DEVICE CHECK
Battery Remaining Longevity: 83 mo
Battery Remaining Percentage: 78 %
Battery Voltage: 3.01 V
Brady Statistic AP VP Percent: 1 %
Brady Statistic AP VS Percent: 69 %
Brady Statistic AS VP Percent: 1 %
Brady Statistic AS VS Percent: 30 %
Brady Statistic RA Percent Paced: 67 %
Brady Statistic RV Percent Paced: 1 %
Date Time Interrogation Session: 20240212035206
Implantable Lead Connection Status: 753985
Implantable Lead Connection Status: 753985
Implantable Lead Implant Date: 20021126
Implantable Lead Implant Date: 20021126
Implantable Lead Location: 753859
Implantable Lead Location: 753860
Implantable Pulse Generator Implant Date: 20210412
Lead Channel Impedance Value: 430 Ohm
Lead Channel Impedance Value: 490 Ohm
Lead Channel Pacing Threshold Amplitude: 0.5 V
Lead Channel Pacing Threshold Amplitude: 1 V
Lead Channel Pacing Threshold Pulse Width: 0.5 ms
Lead Channel Pacing Threshold Pulse Width: 0.9 ms
Lead Channel Sensing Intrinsic Amplitude: 1.2 mV
Lead Channel Sensing Intrinsic Amplitude: 6.1 mV
Lead Channel Setting Pacing Amplitude: 2 V
Lead Channel Setting Pacing Amplitude: 2.5 V
Lead Channel Setting Pacing Pulse Width: 0.9 ms
Lead Channel Setting Sensing Sensitivity: 2 mV
Pulse Gen Model: 2272
Pulse Gen Serial Number: 3814236

## 2022-11-19 DIAGNOSIS — H401134 Primary open-angle glaucoma, bilateral, indeterminate stage: Secondary | ICD-10-CM | POA: Diagnosis not present

## 2022-12-01 ENCOUNTER — Other Ambulatory Visit: Payer: Self-pay | Admitting: Family Medicine

## 2022-12-02 ENCOUNTER — Other Ambulatory Visit: Payer: Self-pay | Admitting: Family Medicine

## 2022-12-28 ENCOUNTER — Ambulatory Visit: Payer: Medicare Other | Admitting: Podiatry

## 2022-12-30 NOTE — Progress Notes (Signed)
Remote pacemaker transmission.   

## 2023-01-04 ENCOUNTER — Other Ambulatory Visit: Payer: Self-pay | Admitting: Family Medicine

## 2023-01-21 ENCOUNTER — Encounter: Payer: Self-pay | Admitting: Podiatry

## 2023-01-21 ENCOUNTER — Ambulatory Visit: Payer: Medicare Other | Admitting: Podiatry

## 2023-01-21 VITALS — BP 146/66 | HR 60

## 2023-01-21 DIAGNOSIS — B351 Tinea unguium: Secondary | ICD-10-CM

## 2023-01-21 DIAGNOSIS — M79674 Pain in right toe(s): Secondary | ICD-10-CM | POA: Diagnosis not present

## 2023-01-21 DIAGNOSIS — M79675 Pain in left toe(s): Secondary | ICD-10-CM

## 2023-01-21 NOTE — Progress Notes (Addendum)
This patient presents to the office with chief complaint of long thick painful nails.  Patient says the nails are painful walking and wearing shoes.  This patient is unable to self treat.  This patient is unable to trim her nails since she is unable to reach her nails.  She presents to the office for preventative foot care services.  She presents to the office with her daughter in a wheelchair.  General Appearance  Alert, conversant and in no acute stress.  Vascular  Dorsalis pedis and posterior tibial  pulses are weakly  palpable  bilaterally.  Capillary return is within normal limits  bilaterally. Temperature is within normal limits  bilaterally.  Neurologic  Senn-Weinstein monofilament wire test within normal limits  bilaterally. Muscle power within normal limits bilaterally.  Nails Thick disfigured discolored nails with subungual debris  from hallux to fifth toes bilaterally. No evidence of bacterial infection or drainage bilaterally.  Orthopedic  No limitations of motion  feet .  No crepitus or effusions noted.  No bony pathology or digital deformities noted.  Skin  normotropic skin with no porokeratosis noted bilaterally.  No signs of infections or ulcers noted.     Onychomycosis  Nails  B/L.  Pain in right toes  Pain in left toes  Debridement of nails both feet followed trimming the nails with dremel tool.    RTC 12 weeks    Helane Gunther DPM

## 2023-02-01 ENCOUNTER — Other Ambulatory Visit: Payer: Self-pay | Admitting: Family Medicine

## 2023-02-15 ENCOUNTER — Ambulatory Visit (INDEPENDENT_AMBULATORY_CARE_PROVIDER_SITE_OTHER): Payer: Medicare Other

## 2023-02-15 DIAGNOSIS — I495 Sick sinus syndrome: Secondary | ICD-10-CM | POA: Diagnosis not present

## 2023-02-16 LAB — CUP PACEART REMOTE DEVICE CHECK
Battery Remaining Longevity: 81 mo
Battery Remaining Percentage: 76 %
Battery Voltage: 3.01 V
Brady Statistic AP VP Percent: 1 %
Brady Statistic AP VS Percent: 68 %
Brady Statistic AS VP Percent: 1 %
Brady Statistic AS VS Percent: 31 %
Brady Statistic RA Percent Paced: 66 %
Brady Statistic RV Percent Paced: 1 %
Date Time Interrogation Session: 20240513033134
Implantable Lead Connection Status: 753985
Implantable Lead Connection Status: 753985
Implantable Lead Implant Date: 20021126
Implantable Lead Implant Date: 20021126
Implantable Lead Location: 753859
Implantable Lead Location: 753860
Implantable Pulse Generator Implant Date: 20210412
Lead Channel Impedance Value: 410 Ohm
Lead Channel Impedance Value: 490 Ohm
Lead Channel Pacing Threshold Amplitude: 0.5 V
Lead Channel Pacing Threshold Amplitude: 1 V
Lead Channel Pacing Threshold Pulse Width: 0.5 ms
Lead Channel Pacing Threshold Pulse Width: 0.9 ms
Lead Channel Sensing Intrinsic Amplitude: 1.2 mV
Lead Channel Sensing Intrinsic Amplitude: 5.6 mV
Lead Channel Setting Pacing Amplitude: 2 V
Lead Channel Setting Pacing Amplitude: 2.5 V
Lead Channel Setting Pacing Pulse Width: 0.9 ms
Lead Channel Setting Sensing Sensitivity: 2 mV
Pulse Gen Model: 2272
Pulse Gen Serial Number: 3814236

## 2023-02-25 ENCOUNTER — Ambulatory Visit: Payer: Medicare Other | Admitting: Family Medicine

## 2023-02-26 ENCOUNTER — Ambulatory Visit (INDEPENDENT_AMBULATORY_CARE_PROVIDER_SITE_OTHER): Payer: Medicare Other | Admitting: Family Medicine

## 2023-02-26 VITALS — BP 136/68 | HR 62 | Resp 14 | Ht 66.0 in | Wt 160.0 lb

## 2023-02-26 DIAGNOSIS — R7302 Impaired glucose tolerance (oral): Secondary | ICD-10-CM | POA: Diagnosis not present

## 2023-02-26 DIAGNOSIS — E7849 Other hyperlipidemia: Secondary | ICD-10-CM

## 2023-02-26 DIAGNOSIS — R2681 Unsteadiness on feet: Secondary | ICD-10-CM | POA: Diagnosis not present

## 2023-02-26 DIAGNOSIS — F411 Generalized anxiety disorder: Secondary | ICD-10-CM | POA: Diagnosis not present

## 2023-02-26 DIAGNOSIS — I10 Essential (primary) hypertension: Secondary | ICD-10-CM | POA: Diagnosis not present

## 2023-02-26 DIAGNOSIS — Z950 Presence of cardiac pacemaker: Secondary | ICD-10-CM

## 2023-02-26 DIAGNOSIS — F331 Major depressive disorder, recurrent, moderate: Secondary | ICD-10-CM

## 2023-02-26 MED ORDER — MUPIROCIN 2 % EX OINT: 1.0000 | TOPICAL_OINTMENT | Freq: Two times a day (BID) | CUTANEOUS | 0 refills | Status: AC

## 2023-02-26 NOTE — Patient Instructions (Signed)
Annual exam 08/08/2023 or shortly after , call if you need me sooner  No changes in medication  Please use your walker more regularly  Antibiotic ointment is prescribed for use  on the upper buttock where the skin split two times daily for 1 week  Bee careful not to fall  HAPPY 96 on June 21!!  Thanks for choosing [facility], we consider it a privelige to serve you.

## 2023-02-27 ENCOUNTER — Encounter: Payer: Self-pay | Admitting: Family Medicine

## 2023-02-27 DIAGNOSIS — F331 Major depressive disorder, recurrent, moderate: Secondary | ICD-10-CM | POA: Insufficient documentation

## 2023-02-27 NOTE — Assessment & Plan Note (Signed)
Controlled, no change in medication  

## 2023-02-27 NOTE — Progress Notes (Signed)
Candace Cervantes     MRN: [id number]      DOB: 13-Jul-1925  Chief Complaint  Patient presents with   Hypertension    follow up chronic problems    HPI Candace Cervantes is here for follow up and re-evaluation of chronic medical conditions, medication management and review of any available recent lab and radiology data.  Preventive health is updated, specifically   Immunization.   Candace Cervantes states she just feels like she is getting older and not able to do as she did in the past.  She says this is expected and she knows that she is fortunate.  Complains of increased joint stiffness and reduced mobility, she denies any falls.  She reports a good appetite and denies any problems with constipation or diarrhea.  She does have a complaint of a crack in the skin between her upper  buttocks which she got in the past week denies any bleeding from the area or itch.  ROS Denies recent fever or chills. Denies sinus pressure, nasal congestion, ear pain or sore throat. Denies chest congestion, productive cough or wheezing. Denies chest pains, palpitations  Denies abdominal pain, nausea, vomiting,diarrhea or constipation.   Denies dysuria, frequency, hesitancy or incontinence. C/o  joint pain, right ankle and lower leg swelling and limitation in mobility. Denies headaches, seizures, numbness, or tingling. Denies uncontrolled  depression, anxiety or insomnia. Denies rash.   PE BP 136/68   Pulse 62   Resp 14   Ht 5\' 6"  (1.676 m)   Wt 160 lb (72.6 kg)   SpO2 97%   BMI 25.82 kg/m    Patient alert and oriented and in no cardiopulmonary distress.  HEENT: No facial asymmetry, EOMI,     Neck decreased ROM  Chest: Clear to auscultation bilaterally.  CVS: S1, S2 systolic  murmur, no S3.Regular rate.  ABD: Soft non tender.   Ext: Distal Right  lower extremity edema  WU:[id number] ROM spine, shoulders, hips and knees.  Skin:stage 1 ulcer / split in skin imn upper buttock crease approx 5 cm long,  no erythema or drainage.  Psych: Good eye contact, normal affect. Memory iimpaired not anxious or depressed appearing.  CNS: CN 2-12 intact, power,  normal throughout.no focal deficits noted.   Assessment & Plan  Essential hypertension Controlled, no change in medication DASH diet and commitment to daily physical activity for a minimum of 30 minutes discussed and encouraged, as a part of hypertension management. The importance of attaining a healthy weight is also discussed.     02/26/2023    4:03 PM 01/21/2023    4:10 PM 10/15/2022    3:15 PM 08/06/2022    3:50 PM 08/06/2022    2:56 PM 08/06/2022    2:55 PM 06/09/2022    3:27 PM  BP/Weight  Systolic BP 136 146 137 152 150 155 112  Diastolic BP 68 66 62 60 60 74 80  Wt. (Lbs) 160  160   162.08 158.9  BMI 25.82 kg/m2  25.82 kg/m2   26.16 kg/m2 25.26 kg/m2       Unsteady gait Using assistive device more regular ands has had no falls  since plast  6 months  Hyperlipemia Hyperlipidemia:Low fat diet discussed and encouraged. Controlled, no change in medication    Lipid Panel  Lab Results  Component Value Date   CHOL 143 10/09/2022   HDL 61 10/09/2022   LDLCALC 70 10/09/2022   TRIG 55 10/09/2022   CHOLHDL 2.3 10/09/2022  GAD (generalized anxiety disorder) Controlled, no change in medication   Depression, major, recurrent, moderate (HCC) CONTINUE CURRENT MED  Cardiac pacemaker in situ Monitored by cardiology, no problems reported has upcoming cardiology eval   IGT (impaired glucose tolerance) Patient educated about the importance of limiting  Carbohydrate intake , the need to commit to daily physical activity for a minimum of 30 minutes , and to commit weight loss. The fact that changes in all these areas will reduce or eliminate all together the development of diabetes is stressed.      Latest Ref Rng & Units 10/09/2022   10:28 AM 05/26/2022    7:27 PM 02/24/2022    9:35 AM 06/26/2021   10:41 AM 12/18/2020    10:49 AM  Diabetic Labs  HbA1c 4.8 - 5.6 % 5.9   5.9     Chol 100 - 199 mg/dL 045   409  811  914   HDL >39 mg/dL 61   83  81  91   Calc LDL 0 - 99 mg/dL 70   80  91  85   Triglycerides 0 - 149 mg/dL 55   51  64  51   Creatinine 0.57 - 1.00 mg/dL 7.82  9.56  2.13  0.86  0.98       02/26/2023    4:03 PM 01/21/2023    4:10 PM 10/15/2022    3:15 PM 08/06/2022    3:50 PM 08/06/2022    2:56 PM 08/06/2022    2:55 PM 06/09/2022    3:27 PM  BP/Weight  Systolic BP 136 146 137 152 150 155 112  Diastolic BP 68 66 62 60 60 74 80  Wt. (Lbs) 160  160   162.08 158.9  BMI 25.82 kg/m2  25.82 kg/m2   26.16 kg/m2 25.26 kg/m2       No data to display          Updated lab needed at/ before next visit.

## 2023-02-27 NOTE — Assessment & Plan Note (Signed)
Patient educated about the importance of limiting  Carbohydrate intake , the need to commit to daily physical activity for a minimum of 30 minutes , and to commit weight loss. The fact that changes in all these areas will reduce or eliminate all together the development of diabetes is stressed.      Latest Ref Rng & Units 10/09/2022   10:28 AM 05/26/2022    7:27 PM 02/24/2022    9:35 AM 06/26/2021   10:41 AM 12/18/2020   10:49 AM  Diabetic Labs  HbA1c 4.8 - 5.6 % 5.9   5.9     Chol 100 - 199 mg/dL 161   096  045  409   HDL >39 mg/dL 61   83  81  91   Calc LDL 0 - 99 mg/dL 70   80  91  85   Triglycerides 0 - 149 mg/dL 55   51  64  51   Creatinine 0.57 - 1.00 mg/dL 8.11  9.14  7.82  9.56  0.98       02/26/2023    4:03 PM 01/21/2023    4:10 PM 10/15/2022    3:15 PM 08/06/2022    3:50 PM 08/06/2022    2:56 PM 08/06/2022    2:55 PM 06/09/2022    3:27 PM  BP/Weight  Systolic BP 136 146 137 152 150 155 112  Diastolic BP 68 66 62 60 60 74 80  Wt. (Lbs) 160  160   162.08 158.9  BMI 25.82 kg/m2  25.82 kg/m2   26.16 kg/m2 25.26 kg/m2       No data to display          Updated lab needed at/ before next visit.

## 2023-02-27 NOTE — Assessment & Plan Note (Signed)
Monitored by cardiology, no problems reported has upcoming cardiology eval

## 2023-02-27 NOTE — Assessment & Plan Note (Addendum)
Controlled, no change in medication DASH diet and commitment to daily physical activity for a minimum of 30 minutes discussed and encouraged, as a part of hypertension management. The importance of attaining a healthy weight is also discussed.     02/26/2023    4:03 PM 01/21/2023    4:10 PM 10/15/2022    3:15 PM 08/06/2022    3:50 PM 08/06/2022    2:56 PM 08/06/2022    2:55 PM 06/09/2022    3:27 PM  BP/Weight  Systolic BP 136 146 137 152 150 155 112  Diastolic BP 68 66 62 60 60 74 80  Wt. (Lbs) 160  160   162.08 158.9  BMI 25.82 kg/m2  25.82 kg/m2   26.16 kg/m2 25.26 kg/m2

## 2023-02-27 NOTE — Assessment & Plan Note (Signed)
CONTINUE CURRENT MED

## 2023-02-27 NOTE — Assessment & Plan Note (Signed)
Using assistive device more regular ands has had no falls  since plast  6 months

## 2023-02-27 NOTE — Assessment & Plan Note (Signed)
Hyperlipidemia:Low fat diet discussed and encouraged. Controlled, no change in medication    Lipid Panel  Lab Results  Component Value Date   CHOL 143 10/09/2022   HDL 61 10/09/2022   LDLCALC 70 10/09/2022   TRIG 55 10/09/2022   CHOLHDL 2.3 10/09/2022

## 2023-03-04 ENCOUNTER — Other Ambulatory Visit: Payer: Self-pay | Admitting: Family Medicine

## 2023-03-12 NOTE — Progress Notes (Signed)
Remote pacemaker transmission.   

## 2023-03-18 DIAGNOSIS — H401134 Primary open-angle glaucoma, bilateral, indeterminate stage: Secondary | ICD-10-CM | POA: Diagnosis not present

## 2023-04-05 ENCOUNTER — Other Ambulatory Visit: Payer: Self-pay | Admitting: Family Medicine

## 2023-04-26 ENCOUNTER — Ambulatory Visit: Payer: Medicare Other | Admitting: Podiatry

## 2023-04-30 ENCOUNTER — Encounter: Payer: Self-pay | Admitting: Podiatry

## 2023-04-30 ENCOUNTER — Ambulatory Visit: Payer: Medicare Other | Admitting: Podiatry

## 2023-04-30 DIAGNOSIS — B351 Tinea unguium: Secondary | ICD-10-CM

## 2023-04-30 DIAGNOSIS — M79675 Pain in left toe(s): Secondary | ICD-10-CM | POA: Diagnosis not present

## 2023-04-30 DIAGNOSIS — M79674 Pain in right toe(s): Secondary | ICD-10-CM | POA: Diagnosis not present

## 2023-04-30 NOTE — Progress Notes (Signed)
  Subjective:  Patient ID: Candace Cervantes, female    DOB: 10-Jan-1925,  MRN: [id number]  Candace Cervantes presents to clinic today for: painful elongated mycotic toenails 1-5 bilaterally which are tender when wearing enclosed shoe gear. Pain is relieved with periodic professional debridement.  Chief Complaint  Patient presents with   Nail Problem    RFC,Referring Provider Kerri Perches, MD,lov:05/24      Callouses    B/L fifth toes   PCP is Kerri Perches, MD.  No Known Allergies  Review of Systems: Negative except as noted in the HPI.  Objective: No changes noted in today's physical examination. There were no vitals filed for this visit.  Candace Cervantes is a pleasant 89 y.o. female in NAD. AAO x 3.  Vascular Examination: Capillary refill time <3 seconds b/l LE. Faintly palpable pedal pulses b/l LE. Digital hair present b/l. No pedal edema b/l. Skin temperature gradient WNL b/l. No varicosities b/l. Marland Kitchen  Dermatological Examination: Pedal skin with normal turgor, texture and tone b/l. No open wounds. No interdigital macerations b/l. Toenails 1-5 b/l thickened, discolored, dystrophic with subungual debris. There is pain on palpation to dorsal aspect of nailplates. Hyperkeratotic lesion(s) bilateral 5th toes.  No erythema, no edema, no drainage, no fluctuance..  Neurological Examination: Protective sensation intact with 10 gram monofilament b/l LE. Vibratory sensation intact b/l LE.   Musculoskeletal Examination: Normal muscle strength 5/5 to all lower extremity muscle groups bilaterally. No pain, crepitus or joint limitation noted with ROM b/l LE. No gross bony pedal deformities b/l. Patient ambulates independently without assistive aids.     Latest Ref Rng & Units 10/09/2022   10:28 AM  Hemoglobin A1C  Hemoglobin-A1c 4.8 - 5.6 % 5.9    Assessment/Plan: 1. Pain due to onychomycosis of toenails of both feet     Patient was evaluated and treated. All patient's and/or  POA's questions/concerns addressed on today's visit. Toenails 1-5 debrided in length and girth without incident. Continue soft, supportive shoe gear daily. Report any pedal injuries to medical professional. Call office if there are any questions/concerns. -As a courtesy, corn(s) bilateral 5th toes pared utilizing sterile scalpel blade without complication or incident. Total number pared=2. -Patient/POA to call should there be question/concern in the interim.   Return in about 3 months (around 07/31/2023).  Freddie Breech, DPM

## 2023-05-05 ENCOUNTER — Other Ambulatory Visit: Payer: Self-pay | Admitting: Family Medicine

## 2023-05-17 ENCOUNTER — Ambulatory Visit (INDEPENDENT_AMBULATORY_CARE_PROVIDER_SITE_OTHER): Payer: Medicare Other

## 2023-05-17 DIAGNOSIS — I495 Sick sinus syndrome: Secondary | ICD-10-CM | POA: Diagnosis not present

## 2023-05-31 NOTE — Progress Notes (Signed)
Remote pacemaker transmission.   

## 2023-06-03 ENCOUNTER — Other Ambulatory Visit: Payer: Self-pay | Admitting: Family Medicine

## 2023-06-11 ENCOUNTER — Telehealth: Payer: Self-pay | Admitting: Family Medicine

## 2023-06-11 NOTE — Telephone Encounter (Signed)
Pt daughter LVM wishing to speak with a nurse in regards to a knot in pt neck. Please advise Thank you

## 2023-06-11 NOTE — Telephone Encounter (Signed)
Spoke with patient daughter. Appt 9/20 at 11:00 with Malachi Bonds.

## 2023-06-15 ENCOUNTER — Ambulatory Visit: Payer: Medicare Other | Attending: Internal Medicine | Admitting: Internal Medicine

## 2023-06-15 ENCOUNTER — Encounter: Payer: Self-pay | Admitting: Internal Medicine

## 2023-06-15 VITALS — BP 124/68 | HR 60 | Ht 70.0 in | Wt 158.0 lb

## 2023-06-15 DIAGNOSIS — I495 Sick sinus syndrome: Secondary | ICD-10-CM

## 2023-06-15 NOTE — Patient Instructions (Signed)
Medication Instructions:  Your physician recommends that you continue on your current medications as directed. Please refer to the Current Medication list given to you today.  *If you need a refill on your cardiac medications before your next appointment, please call your pharmacy*   Lab Work: NONE   If you have labs (blood work) drawn today and your tests are completely normal, you will receive your results only by: MyChart Message (if you have MyChart) OR A paper copy in the mail If you have any lab test that is abnormal or we need to change your treatment, we will call you to review the results.   Testing/Procedures: NONE    Follow-Up: At [facility], you and your health needs are our priority.  As part of our continuing mission to provide you with exceptional heart care, we have created designated Provider Care Teams.  These Care Teams include your primary Cardiologist (physician) and Advanced Practice Providers (APPs -  Physician Assistants and Nurse Practitioners) who all work together to provide you with the care you need, when you need it.  We recommend signing up for the patient portal called "MyChart".  Sign up information is provided on this After Visit Summary.  MyChart is used to connect with patients for Virtual Visits (Telemedicine).  Patients are able to view lab/test results, encounter notes, upcoming appointments, etc.  Non-urgent messages can be sent to your provider as well.   To learn more about what you can do with MyChart, go to [email or web address].    Your next appointment:   1 year(s)  Provider:   You may see Lewayne Bunting, MD or one of the following Advanced Practice Providers on your designated Care Team:   Randall An, PA-C  Jacolyn Reedy, PA-C     Other Instructions Thank you for choosing  [facility]!

## 2023-06-15 NOTE — Progress Notes (Signed)
HPI Candace Cervantes returns today for followup. She is a pleasant 89 yo woman with a h/o HTN, brief PAF, and sinus node dysfunction s/p PPM insertion. She had a gen change out over 4 years ago. She feels well. She denies chest pain or sob. She is using a walker rarely and despite this has had some falls.  She is not on systemic anti-coagulation due to her falls and only brief atrial fib.  No Known Allergies   Current Outpatient Medications  Medication Sig Dispense Refill   Acetaminophen (TYLENOL 8 HOUR ARTHRITIS PAIN PO) Take 1 tablet by mouth daily. Sometimes takes 1 extra per day     amLODipine (NORVASC) 10 MG tablet TAKE ONE TABLET BY MOUTH ONCE DAILY. 30 tablet 0   citalopram (CELEXA) 10 MG tablet TAKE ONE TABLET BY MOUTH ONCE DAILY. 30 tablet 0   dorzolamide-timolol (COSOPT) 22.3-6.8 MG/ML ophthalmic solution 1 drop 2 (two) times daily.     Famotidine (ACID CONTROLLER PO) Take 10 mg by mouth daily.     hydrochlorothiazide (MICROZIDE) 12.5 MG capsule TAKE 1 CAPSULE BY MOUTH ONCE DAILY. 30 capsule 0   isosorbide mononitrate (IMDUR) 30 MG 24 hr tablet TAKE (1/2) TABLET BY MOUTH ONCE DAILY. 15 tablet 0   latanoprost (XALATAN) 0.005 % ophthalmic solution 1 drop daily.     loratadine (CLARITIN) 10 MG tablet Take 1 tablet (10 mg total) by mouth daily as needed for allergies. 30 tablet 1   LYCOPENE PO Take by mouth.     Multiple Vitamin (MULTIVITAMIN WITH MINERALS) TABS tablet Take 1 tablet by mouth daily.     nitroGLYCERIN (NITROSTAT) 0.4 MG SL tablet DISSOLVE 1 TABLET SUBLINGUALLY AS NEEDED FOR CHEST PAIN, MAY REPEAT EVERY 5 MINUTES. AFTER 3 TABLETS CALL 911. 25 tablet 0   omeprazole (PRILOSEC) 20 MG capsule TAKE ONE CAPSULE BY MOUTH ONCE DAILY. 30 capsule 0   potassium chloride SA (KLOR-CON M) 20 MEQ tablet TAKE 1 TABLET BY MOUTH DAILY 30 tablet 0   pravastatin (PRAVACHOL) 40 MG tablet TAKE ONE TABLET BY MOUTH ONCE DAILY. 30 tablet 0   ROCKLATAN 0.02-0.005 % SOLN Apply 1 drop to eye at  bedtime.     triamterene-hydrochlorothiazide (MAXZIDE-25) 37.5-25 MG tablet Take 1 tablet by mouth daily.     potassium chloride (KLOR-CON) 20 MEQ packet Take 20 mEq by mouth daily for 5 doses. 5 packet 0   No current facility-administered medications for this visit.     Past Medical History:  Diagnosis Date   Anxiety disorder    Arthritis    CAD (coronary artery disease) 2005   Nonobstructive 40% RCA lesion and normal ejection fraction at cath    Chronic headache disorder 10/09/2017   GERD (gastroesophageal reflux disease)    Hyperlipidemia    Hypertension    Osteoporosis    Pacemaker 2002   Permanent Placement   Pacemaker 2007   Generator Changed    Prediabetes    Sinoatrial node dysfunction (HCC)    SSS (sick sinus syndrome) (HCC)    Zenker diverticulum    previously evaluated by Dr. Lazarus Salines in 2008.     ROS:   All systems reviewed and negative except as noted in the HPI.   Past Surgical History:  Procedure Laterality Date   CHOLECYSTECTOMY     COLONOSCOPY  2003   no polyps.   CYSTECTOMY     from back of the neck   ESOPHAGOGASTRODUODENOSCOPY  2008   Dr. Darrick Penna, difficulty passing  scope through UES, incomplete fibrous ring at GEJ, hh, multiple benign gastri polyps   ESOPHAGOGASTRODUODENOSCOPY N/A 10/17/2013   ZENKER'S, STRICTURE: 10-12.8 MM,  NSAID GASTRITIS FG POLYPS   ESOPHAGOGASTRODUODENOSCOPY (EGD) WITH ESOPHAGEAL DILATION N/A 10/30/2013   SLF: 1. Zenkers diverticulum with a large opening at the cricopharyngeus 2. 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 Innumerable Fundic gland polyps 4. Mild NSAID gastritis   MALONEY DILATION N/A 10/17/2013   Procedure: MALONEY DILATION;  Surgeon: West Bali, MD;  [facility];  Service: Endoscopy;  Laterality: N/A;  with PEDS GASTROSCOPE   PACEMAKER INSERTION  2002   PACEMAKER PLACEMENT  2007   replacement    PPM GENERATOR CHANGEOUT N/A 01/15/2020   Procedure: PPM GENERATOR CHANGEOUT;  Surgeon: Marinus Maw, MD;   Location: [facility] INVASIVE CV LAB;  Service: Cardiovascular;  Laterality: N/A;   SAVORY DILATION N/A 10/17/2013   Procedure: SAVORY DILATION;  Surgeon: West Bali, MD;  Location: [facility];  Service: Endoscopy;  Laterality: N/A;  with PEDS GASTROSCOPE   TOTAL ABDOMINAL HYSTERECTOMY  2002     Family History  Problem Relation Age of Onset   Heart disease Mother    Lung disease Sister    Diabetes Sister    Alcohol abuse Brother    Cancer Brother        possible prostate   Colon cancer Other        sibling, age 31   Heart attack Other        uncle     Social History   Socioeconomic History   Marital status: Widowed    Spouse name: Not on file   Number of children: 2   Years of education: Not on file   Highest education level: 12th grade  Occupational History   Not on file  Tobacco Use   Smoking status: Never   Smokeless tobacco: Never  Vaping Use   Vaping status: Never Used  Substance and Sexual Activity   Alcohol use: Never   Drug use: Never   Sexual activity: Not Currently  Other Topics Concern   Not on file  Social History Narrative   Lives with daughter Marlowe Shores    Right handed    Caffeine: coffee 1 cup/day   Social Determinants of Health   Financial Resource Strain: Low Risk  (09/14/2022)   Overall Financial Resource Strain (CARDIA)    Difficulty of Paying Living Expenses: Not very hard  Food Insecurity: No Food Insecurity (09/14/2022)   Hunger Vital Sign    Worried About Running Out of Food in the Last Year: Never true    Ran Out of Food in the Last Year: Never true  Transportation Needs: No Transportation Needs (09/14/2022)   PRAPARE - Administrator, Civil Service (Medical): No    Lack of Transportation (Non-Medical): No  Physical Activity: Insufficiently Active (09/14/2022)   Exercise Vital Sign    Days of Exercise per Week: 3 days    Minutes of Exercise per Session: 30 min  Stress: No Stress Concern Present (09/14/2022)    [employer] of Occupational Health - Occupational Stress Questionnaire    Feeling of Stress : Only a little  Social Connections: Moderately Isolated (09/14/2022)   Social Connection and Isolation Panel [NHANES]    Frequency of Communication with Friends and Family: More than three times a week    Frequency of Social Gatherings with Friends and Family: Twice a week    Attends Religious Services: More than  4 times per year    Active Member of Clubs or Organizations: No    Attends Banker Meetings: Never    Marital Status: Widowed  Intimate Partner Violence: Not At Risk (09/14/2022)   Humiliation, Afraid, Rape, and Kick questionnaire    Fear of Current or Ex-Partner: No    Emotionally Abused: No    Physically Abused: No    Sexually Abused: No     BP 124/68   Pulse 60   Ht 5\' 10"  (1.778 m)   Wt 158 lb (71.7 kg)   SpO2 95%   BMI 22.67 kg/m   Physical Exam:  Well appearing NAD HEENT: Unremarkable Neck:  No JVD, no thyromegally Lymphatics:  No adenopathy Back:  No CVA tenderness Lungs:  Clear with no wheezes HEART:  Regular rate rhythm, no murmurs, no rubs, no clicks Abd:  soft, positive bowel sounds, no organomegally, no rebound, no guarding Ext:  2 plus pulses, no edema, no cyanosis, no clubbing Skin:  No rashes no nodules Neuro:  CN II through XII intact, motor grossly intact  EKG - nsr with atrial pacing  DEVICE  Normal device function.  See PaceArt for details.   Assess/Plan: 1. Sinus node dysfunction - she is atrial pacing and asymptomatic.  2.  PPM - her St. Jude DDD PM is working normally.  3. PAF - she has had less than 1% atrial fib. We will hold off on this for now as the risk benefit does not warrant as she has had falls and will likely to continue to do so and her episodes of atrial fib have been brief. 4. HTN - her bp is well controlled.    Leonia Reeves.D.

## 2023-06-24 LAB — CUP PACEART INCLINIC DEVICE CHECK
Battery Remaining Longevity: 91 mo
Battery Voltage: 3.01 V
Brady Statistic RA Percent Paced: 64 %
Brady Statistic RV Percent Paced: 0.75 %
Date Time Interrogation Session: 20240910114200
Implantable Lead Connection Status: 753985
Implantable Lead Connection Status: 753985
Implantable Lead Implant Date: 20021126
Implantable Lead Implant Date: 20021126
Implantable Lead Location: 753859
Implantable Lead Location: 753860
Implantable Pulse Generator Implant Date: 20210412
Lead Channel Impedance Value: 425 Ohm
Lead Channel Impedance Value: 487.5 Ohm
Lead Channel Pacing Threshold Amplitude: 0.5 V
Lead Channel Pacing Threshold Amplitude: 0.5 V
Lead Channel Pacing Threshold Amplitude: 1 V
Lead Channel Pacing Threshold Amplitude: 1 V
Lead Channel Pacing Threshold Pulse Width: 0.5 ms
Lead Channel Pacing Threshold Pulse Width: 0.5 ms
Lead Channel Pacing Threshold Pulse Width: 0.9 ms
Lead Channel Pacing Threshold Pulse Width: 0.9 ms
Lead Channel Sensing Intrinsic Amplitude: 1.2 mV
Lead Channel Sensing Intrinsic Amplitude: 5.5 mV
Lead Channel Setting Pacing Amplitude: 2 V
Lead Channel Setting Pacing Amplitude: 2.5 V
Lead Channel Setting Pacing Pulse Width: 0.9 ms
Lead Channel Setting Sensing Sensitivity: 2 mV
Pulse Gen Model: 2272
Pulse Gen Serial Number: 3814236

## 2023-06-25 ENCOUNTER — Encounter: Payer: Self-pay | Admitting: Family Medicine

## 2023-06-25 ENCOUNTER — Ambulatory Visit (INDEPENDENT_AMBULATORY_CARE_PROVIDER_SITE_OTHER): Payer: Medicare Other | Admitting: Family Medicine

## 2023-06-25 VITALS — BP 135/66 | HR 64 | Ht 70.0 in | Wt 158.1 lb

## 2023-06-25 DIAGNOSIS — R590 Localized enlarged lymph nodes: Secondary | ICD-10-CM | POA: Diagnosis not present

## 2023-06-25 DIAGNOSIS — R0989 Other specified symptoms and signs involving the circulatory and respiratory systems: Secondary | ICD-10-CM | POA: Diagnosis not present

## 2023-06-25 NOTE — Patient Instructions (Addendum)
I appreciate the opportunity to provide care to you today!    Follow up: PCP  Labs: CBC and BMP  Lymph nodes are small, bean-shaped structures that are part of the lymphatic system. They help filter lymph fluid and play a crucial role in the immune response.  Causes of Inflammation:  Infections: Common viral or bacterial infections (e.g., colds, strep throat). Immune Responses: Conditions like allergies or autoimmune diseases. Cancer: Some cancers can cause lymph nodes to become swollen. Localized Issues: Inflammation can occur due to injuries or infections near the lymph nodes.  Symptoms of Inflamed Lymph Nodes: Swelling in the area where the lymph nodes are located (commonly in the neck, armpits, or groin). Tenderness or pain in the affected area. Redness or warmth over the swollen nodes. Accompanying symptoms may include fever, fatigue, or general malaise. Please Seek Medical Attention: Persistent swelling lasting more than two weeks. Fever or night sweats. Unexplained weight loss. Difficulty swallowing or breathing.  Self-Care Measures: Rest: Allow your body to recover. Hydration: Drink plenty of fluids to stay hydrated. Warm Compress: Applying a warm compress to the affected area can help alleviate discomfort. Over-the-Counter Pain Relief: Medications like acetaminophen to help reduce pain.    Please continue to a heart-healthy diet and increase your physical activities. Try to exercise for at least five days a week.    It was a pleasure to see you and I look forward to continuing to work together on your health and well-being. Please do not hesitate to call the office if you need care or have questions about your care.  In case of emergency, please visit the Emergency Department for urgent care, or contact our clinic at [phone] to schedule an appointment. We're here to help you!   Have a wonderful day and week. With Gratitude, Gilmore Laroche MSN,  FNP-BC

## 2023-06-25 NOTE — Progress Notes (Signed)
Acute Office Visit  Subjective:    Patient ID: Candace Cervantes, female    DOB: August 02, 1925, 89 y.o.   MRN: [id number]  Chief Complaint  Patient presents with   Neck Pain    Pt reports knot on neck since 3 weeks ago.    HPI The patient presents today with complaints of a knot in her neck, which has shown improvement. She reports that the onset of symptoms occurred 3 weeks ago, describing a tender, marble-like sensation on both the right and left sides. The patient denies experiencing any fever, chills, recent upper respiratory infections, cough, or nasal congestion.  She denies unexplained weight loss and difficulty swallowing breathing.    Past Medical History:  Diagnosis Date   Anxiety disorder    Arthritis    CAD (coronary artery disease) 2005   Nonobstructive 40% RCA lesion and normal ejection fraction at cath    Chronic headache disorder 10/09/2017   GERD (gastroesophageal reflux disease)    Hyperlipidemia    Hypertension    Osteoporosis    Pacemaker 2002   Permanent Placement   Pacemaker 2007   Generator Changed    Prediabetes    Sinoatrial node dysfunction (HCC)    SSS (sick sinus syndrome) (HCC)    Zenker diverticulum    previously evaluated by Dr. Lazarus Salines in 2008.     Past Surgical History:  Procedure Laterality Date   CHOLECYSTECTOMY     COLONOSCOPY  2003   no polyps.   CYSTECTOMY     from back of the neck   ESOPHAGOGASTRODUODENOSCOPY  2008   Dr. Darrick Penna, difficulty passing scope through UES, incomplete fibrous ring at GEJ, hh, multiple benign gastri polyps   ESOPHAGOGASTRODUODENOSCOPY N/A 10/17/2013   ZENKER'S, STRICTURE: 10-12.8 MM,  NSAID GASTRITIS FG POLYPS   ESOPHAGOGASTRODUODENOSCOPY (EGD) WITH ESOPHAGEAL DILATION N/A 10/30/2013   SLF: 1. Zenkers diverticulum with a large opening at the cricopharyngeus 2. 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 Innumerable Fundic gland polyps 4. Mild NSAID gastritis   MALONEY DILATION N/A 10/17/2013   Procedure: MALONEY  DILATION;  Surgeon: West Bali, MD;  Location: [facility];  Service: Endoscopy;  Laterality: N/A;  with PEDS GASTROSCOPE   PACEMAKER INSERTION  2002   PACEMAKER PLACEMENT  2007   replacement    PPM GENERATOR CHANGEOUT N/A 01/15/2020   Procedure: PPM GENERATOR CHANGEOUT;  Surgeon: Marinus Maw, MD;  Location: [facility] INVASIVE CV LAB;  Service: Cardiovascular;  Laterality: N/A;   SAVORY DILATION N/A 10/17/2013   Procedure: SAVORY DILATION;  Surgeon: West Bali, MD;  Location: [facility];  Service: Endoscopy;  Laterality: N/A;  with PEDS GASTROSCOPE   TOTAL ABDOMINAL HYSTERECTOMY  2002    Family History  Problem Relation Age of Onset   Heart disease Mother    Lung disease Sister    Diabetes Sister    Alcohol abuse Brother    Cancer Brother        possible prostate   Colon cancer Other        sibling, age 44   Heart attack Other        uncle    Social History   Socioeconomic History   Marital status: Widowed    Spouse name: Not on file   Number of children: 2   Years of education: Not on file   Highest education level: 12th grade  Occupational History   Not on file  Tobacco Use   Smoking status: Never   Smokeless tobacco:  Never  Vaping Use   Vaping status: Never Used  Substance and Sexual Activity   Alcohol use: Never   Drug use: Never   Sexual activity: Not Currently  Other Topics Concern   Not on file  Social History Narrative   Lives with daughter Marlowe [place]    Right handed    Caffeine: coffee 1 cup/day   Social Determinants of Health   Financial Resource Strain: Low Risk  (09/14/2022)   Overall Financial Resource Strain (CARDIA)    Difficulty of Paying Living Expenses: Not very hard  Food Insecurity: No Food Insecurity (09/14/2022)   Hunger Vital Sign    Worried About Running Out of Food in the Last Year: Never true    Ran Out of Food in the Last Year: Never true  Transportation Needs: No Transportation Needs (09/14/2022)   PRAPARE -  Administrator, Civil Service (Medical): No    Lack of Transportation (Non-Medical): No  Physical Activity: Insufficiently Active (09/14/2022)   Exercise Vital Sign    Days of Exercise per Week: 3 days    Minutes of Exercise per Session: 30 min  Stress: No Stress Concern Present (09/14/2022)   [employer] of Occupational Health - Occupational Stress Questionnaire    Feeling of Stress : Only a little  Social Connections: Moderately Isolated (09/14/2022)   Social Connection and Isolation Panel [NHANES]    Frequency of Communication with Friends and Family: More than three times a week    Frequency of Social Gatherings with Friends and Family: Twice a week    Attends Religious Services: More than 4 times per year    Active Member of [employer] or Organizations: No    Attends Banker Meetings: Never    Marital Status: Widowed  Intimate Partner Violence: Not At Risk (09/14/2022)   Humiliation, Afraid, Rape, and Kick questionnaire    Fear of Current or Ex-Partner: No    Emotionally Abused: No    Physically Abused: No    Sexually Abused: No    Outpatient Medications Prior to Visit  Medication Sig Dispense Refill   Acetaminophen (TYLENOL 8 HOUR ARTHRITIS PAIN PO) Take 1 tablet by mouth daily. Sometimes takes 1 extra per day     amLODipine (NORVASC) 10 MG tablet TAKE ONE TABLET BY MOUTH ONCE DAILY. 30 tablet 0   citalopram (CELEXA) 10 MG tablet TAKE ONE TABLET BY MOUTH ONCE DAILY. 30 tablet 0   dorzolamide-timolol (COSOPT) 22.3-6.8 MG/ML ophthalmic solution 1 drop 2 (two) times daily.     Famotidine (ACID CONTROLLER PO) Take 10 mg by mouth daily.     hydrochlorothiazide (MICROZIDE) 12.5 MG capsule TAKE 1 CAPSULE BY MOUTH ONCE DAILY. 30 capsule 0   isosorbide mononitrate (IMDUR) 30 MG 24 hr tablet TAKE (1/2) TABLET BY MOUTH ONCE DAILY. 15 tablet 0   latanoprost (XALATAN) 0.005 % ophthalmic solution 1 drop daily.     loratadine (CLARITIN) 10 MG tablet Take 1 tablet  (10 mg total) by mouth daily as needed for allergies. 30 tablet 1   LYCOPENE PO Take by mouth.     Multiple Vitamin (MULTIVITAMIN WITH MINERALS) TABS tablet Take 1 tablet by mouth daily.     nitroGLYCERIN (NITROSTAT) 0.4 MG SL tablet DISSOLVE 1 TABLET SUBLINGUALLY AS NEEDED FOR CHEST PAIN, MAY REPEAT EVERY 5 MINUTES. AFTER 3 TABLETS CALL 911. 25 tablet 0   omeprazole (PRILOSEC) 20 MG capsule TAKE ONE CAPSULE BY MOUTH ONCE DAILY. 30 capsule 0   potassium chloride SA (  KLOR-CON M) 20 MEQ tablet TAKE 1 TABLET BY MOUTH DAILY 30 tablet 0   pravastatin (PRAVACHOL) 40 MG tablet TAKE ONE TABLET BY MOUTH ONCE DAILY. 30 tablet 0   ROCKLATAN 0.02-0.005 % SOLN Apply 1 drop to eye at bedtime.     triamterene-hydrochlorothiazide (MAXZIDE-25) 37.5-25 MG tablet Take 1 tablet by mouth daily.     potassium chloride (KLOR-CON) 20 MEQ packet Take 20 mEq by mouth daily for 5 doses. 5 packet 0   No facility-administered medications prior to visit.    No Known Allergies  Review of Systems  Constitutional:  Negative for chills and fever.  Eyes:  Negative for visual disturbance.  Respiratory:  Negative for chest tightness and shortness of breath.   Neurological:  Negative for dizziness and headaches.       Objective:    Physical Exam HENT:     Head: Normocephalic.     Mouth/Throat:     Mouth: Mucous membranes are moist.  Cardiovascular:     Rate and Rhythm: Normal rate.     Heart sounds: Normal heart sounds.  Pulmonary:     Effort: Pulmonary effort is normal.     Breath sounds: Normal breath sounds.  Lymphadenopathy:     Cervical: Cervical adenopathy present.     Right cervical: Superficial cervical adenopathy and deep cervical adenopathy present.     Left cervical: Superficial cervical adenopathy and deep cervical adenopathy present.  Neurological:     Mental Status: She is alert.     BP 135/66   Pulse 64   Ht 5\' 10"  (1.778 m)   Wt 158 lb 1.3 oz (71.7 kg)   SpO2 95%   BMI 22.68 kg/m  Wt  Readings from Last 3 Encounters:  06/25/23 158 lb 1.3 oz (71.7 kg)  06/15/23 158 lb (71.7 kg)  02/26/23 160 lb (72.6 kg)       Assessment & Plan:  Lymphadenopathy, cervical Assessment & Plan: The patient's current symptoms are likely related to lymphadenopathy. I recommended rest, hydration, and the application of warm compresses to the affected area, as well as over-the-counter Tylenol for pain relief as needed. I will also assess her complete blood count (CBC) and basic metabolic panel (BMP) today.     Orders: -     CBC with Differential/Platelet -     BMP8+EGFR  Note: This chart has been completed using Engineer, civil (consulting) software, and while attempts have been made to ensure accuracy, certain words and phrases may not be transcribed as intended.    Gilmore Laroche, FNP

## 2023-06-25 NOTE — Assessment & Plan Note (Addendum)
The patient's current symptoms are likely related to lymphadenopathy. I recommended rest, hydration, and the application of warm compresses to the affected area, as well as over-the-counter Tylenol for pain relief as needed. I will also assess her complete blood count (CBC) and basic metabolic panel (BMP) today.

## 2023-06-26 LAB — CBC WITH DIFFERENTIAL/PLATELET
Basophils Absolute: 0 10*3/uL (ref 0.0–0.2)
Basos: 0 %
EOS (ABSOLUTE): 0 10*3/uL (ref 0.0–0.4)
Eos: 1 %
Hematocrit: 36.4 % (ref 34.0–46.6)
Hemoglobin: 11.6 g/dL (ref 11.1–15.9)
Immature Grans (Abs): 0 10*3/uL (ref 0.0–0.1)
Immature Granulocytes: 0 %
Lymphocytes Absolute: 1.5 10*3/uL (ref 0.7–3.1)
Lymphs: 36 %
MCH: 31.4 pg (ref 26.6–33.0)
MCHC: 31.9 g/dL (ref 31.5–35.7)
MCV: 99 fL — ABNORMAL HIGH (ref 79–97)
Monocytes Absolute: 0.4 10*3/uL (ref 0.1–0.9)
Monocytes: 9 %
Neutrophils Absolute: 2.3 10*3/uL (ref 1.4–7.0)
Neutrophils: 54 %
Platelets: 174 10*3/uL (ref 150–450)
RBC: 3.69 x10E6/uL — ABNORMAL LOW (ref 3.77–5.28)
RDW: 13.2 % (ref 11.7–15.4)
WBC: 4.3 10*3/uL (ref 3.4–10.8)

## 2023-06-26 LAB — BMP8+EGFR
BUN/Creatinine Ratio: 23 (ref 12–28)
BUN: 23 mg/dL (ref 10–36)
CO2: 27 mmol/L (ref 20–29)
Calcium: 9.6 mg/dL (ref 8.7–10.3)
Chloride: 104 mmol/L (ref 96–106)
Creatinine, Ser: 0.99 mg/dL (ref 0.57–1.00)
Glucose: 87 mg/dL (ref 70–99)
Potassium: 4.4 mmol/L (ref 3.5–5.2)
Sodium: 142 mmol/L (ref 134–144)
eGFR: 52 mL/min/{1.73_m2} — ABNORMAL LOW (ref >59)

## 2023-06-29 NOTE — Progress Notes (Signed)
Please inform the patient that her labs are stable

## 2023-07-04 ENCOUNTER — Other Ambulatory Visit: Payer: Self-pay | Admitting: Family Medicine

## 2023-07-19 ENCOUNTER — Encounter: Payer: Self-pay | Admitting: Podiatry

## 2023-07-19 ENCOUNTER — Ambulatory Visit: Payer: Medicare Other | Admitting: Podiatry

## 2023-07-19 DIAGNOSIS — Q828 Other specified congenital malformations of skin: Secondary | ICD-10-CM

## 2023-07-19 DIAGNOSIS — M79675 Pain in left toe(s): Secondary | ICD-10-CM

## 2023-07-19 DIAGNOSIS — B351 Tinea unguium: Secondary | ICD-10-CM | POA: Diagnosis not present

## 2023-07-19 DIAGNOSIS — M79674 Pain in right toe(s): Secondary | ICD-10-CM

## 2023-07-22 DIAGNOSIS — H401134 Primary open-angle glaucoma, bilateral, indeterminate stage: Secondary | ICD-10-CM | POA: Diagnosis not present

## 2023-07-26 NOTE — Progress Notes (Signed)
  Subjective:  Patient ID: Candace Cervantes, female    DOB: 08-04-1925,  MRN: [id number]  Candace Cervantes presents to clinic today for corn(s) of both feet and painful thick toenails that are difficult to trim. Painful toenails interfere with ambulation. Aggravating factors include wearing enclosed shoe gear. Pain is relieved with periodic professional debridement. Painful corns are aggravated when weightbearing when wearing enclosed shoe gear. Pain is relieved with periodic professional debridement.  Chief Complaint  Patient presents with   RFC   New problem(s): None.   PCP is Kerri Perches, MD.  No Known Allergies  Review of Systems: Negative except as noted in the HPI.  Objective: No changes noted in today's physical examination. There were no vitals filed for this visit. Candace Cervantes is a pleasant 89 y.o. female WD, WN in NAD. AAO x 3.  Vascular Examination: Capillary refill time <3 seconds b/l LE. Faintly palpable pedal pulses b/l LE. Digital hair present b/l. No pedal edema b/l. Skin temperature gradient WNL b/l. No varicosities b/l. Marland Kitchen  Dermatological Examination: Pedal skin with normal turgor, texture and tone b/l. No open wounds. No interdigital macerations b/l. Toenails 1-5 b/l thickened, discolored, dystrophic with subungual debris. There is pain on palpation to dorsal aspect of nailplates.   Porokeratotic lesion(s) bilateral 5th toes.  No erythema, no edema, no drainage, no fluctuance..  Neurological Examination: Protective sensation intact with 10 gram monofilament b/l LE. Vibratory sensation intact b/l LE.   Musculoskeletal Examination: Normal muscle strength 5/5 to all lower extremity muscle groups bilaterally. No pain, crepitus or joint limitation noted with ROM b/l LE. No gross bony pedal deformities b/l. Patient using transport chair on today's visit.  Assessment/Plan: 1. Pain due to onychomycosis of toenails of both feet   2. Porokeratosis   3. Pain in toes  of both feet     -Consent given for treatment as described below: -Examined patient. -Continue supportive shoe gear daily. -Mycotic toenails 1-5 bilaterally were debrided in length and girth with sterile nail nippers and dremel without incident. -Corn(s) bilateral 5th toes pared utilizing sterile scalpel blade without complication or incident. Total number debrided=2. -Patient/POA to call should there be question/concern in the interim.   Return in about 3 months (around 10/19/2023).  Freddie Breech, DPM

## 2023-08-02 ENCOUNTER — Other Ambulatory Visit: Payer: Self-pay | Admitting: Family Medicine

## 2023-08-02 DIAGNOSIS — E7849 Other hyperlipidemia: Secondary | ICD-10-CM | POA: Diagnosis not present

## 2023-08-02 DIAGNOSIS — R7302 Impaired glucose tolerance (oral): Secondary | ICD-10-CM | POA: Diagnosis not present

## 2023-08-02 DIAGNOSIS — I10 Essential (primary) hypertension: Secondary | ICD-10-CM | POA: Diagnosis not present

## 2023-08-03 LAB — CMP14+EGFR
ALT: 21 [IU]/L (ref 0–32)
AST: 23 [IU]/L (ref 0–40)
Albumin: 4.3 g/dL (ref 3.6–4.6)
Alkaline Phosphatase: 73 [IU]/L (ref 44–121)
BUN/Creatinine Ratio: 22 (ref 12–28)
BUN: 20 mg/dL (ref 10–36)
Bilirubin Total: 0.4 mg/dL (ref 0.0–1.2)
CO2: 26 mmol/L (ref 20–29)
Calcium: 9.7 mg/dL (ref 8.7–10.3)
Chloride: 102 mmol/L (ref 96–106)
Creatinine, Ser: 0.91 mg/dL (ref 0.57–1.00)
Globulin, Total: 2.7 g/dL (ref 1.5–4.5)
Glucose: 90 mg/dL (ref 70–99)
Potassium: 3.7 mmol/L (ref 3.5–5.2)
Sodium: 140 mmol/L (ref 134–144)
Total Protein: 7 g/dL (ref 6.0–8.5)
eGFR: 58 mL/min/{1.73_m2} — ABNORMAL LOW (ref >59)

## 2023-08-03 LAB — LIPID PANEL
Chol/HDL Ratio: 2 {ratio} (ref 0.0–4.4)
Cholesterol, Total: 172 mg/dL (ref 100–199)
HDL: 87 mg/dL (ref >39)
LDL Chol Calc (NIH): 74 mg/dL (ref 0–99)
Triglycerides: 57 mg/dL (ref 0–149)
VLDL Cholesterol Cal: 11 mg/dL (ref 5–40)

## 2023-08-03 LAB — HEMOGLOBIN A1C
Est. average glucose Bld gHb Est-mCnc: 131 mg/dL
Hgb A1c MFr Bld: 6.2 % — ABNORMAL HIGH (ref 4.8–5.6)

## 2023-08-10 ENCOUNTER — Ambulatory Visit: Payer: Medicare Other | Admitting: Family Medicine

## 2023-08-10 ENCOUNTER — Encounter: Payer: Self-pay | Admitting: Family Medicine

## 2023-08-10 VITALS — BP 160/84 | HR 68 | Resp 16 | Ht 70.0 in | Wt 158.0 lb

## 2023-08-10 DIAGNOSIS — Z23 Encounter for immunization: Secondary | ICD-10-CM | POA: Diagnosis not present

## 2023-08-10 DIAGNOSIS — Z0001 Encounter for general adult medical examination with abnormal findings: Secondary | ICD-10-CM | POA: Diagnosis not present

## 2023-08-10 DIAGNOSIS — I10 Essential (primary) hypertension: Secondary | ICD-10-CM | POA: Diagnosis not present

## 2023-08-10 MED ORDER — ISOSORBIDE MONONITRATE ER 30 MG PO TB24: ORAL_TABLET | ORAL | 3 refills | Status: DC

## 2023-08-10 NOTE — Assessment & Plan Note (Signed)
After obtaining informed consent, the vaccine is  administered , with no adverse effect noted at the time of administration.  

## 2023-08-10 NOTE — Assessment & Plan Note (Addendum)
Annual exam as documented.  Immunization needs are specifically addressed at this visit.  
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
## 2023-08-10 NOTE — Progress Notes (Signed)
    Candace Cervantes     MRN: [id number]      DOB: [birth year]  Chief Complaint  Patient presents with   Annual Exam    Still has the knots in her neck that she wants checked and she has been feeling more tired than usual     HPI: Patient is in for annual physical exam. Health concerns are expressed or addressed at the visit. Blood pressure is elevated , and uncontrolled, medication adjustment made Recent labs,  are reviewed. Immunization is reviewed , and  updated    PE: BP (!) 160/84   Pulse 68   Resp 16   Ht 5\' 10"  (1.778 m)   Wt 158 lb (71.7 kg)   SpO2 93%   BMI 22.67 kg/m   Pleasant  female, alert  in no cardio-pulmonary distress. Afebrile. HEENT No facial trauma or asymetry. Sinuses non tender.  Extra occullar muscles intact.. External ears normal, . Neck: decreased ROM, no adenopathy,JVD or thyromegaly.No bruits.  Chest: Clear to ascultation bilaterally.No crackles or wheezes. Non tender to palpation  Cardiovascular system; Heart sounds normal,  S1 and  S2 ,no S3.  No murmurPeripheral pulses normal.  Abdomen: Soft, non tender,. Bowel sounds normal. No guarding, tenderness or rebound.    Musculoskeletal exam: Decreased  ROM of spine, hips , shoulders and knees. deformity ,swelling and  crepitus noted.  muscle wasting  atrophy.   Neurologic: Cranial nerves 2 to 12 intact. Power, tone ,sensation  normal throughout.  disturbance in gait. No tremor.  Skin: Intact, no ulceration, erythema , scaling or rash noted. Pigmentation normal throughout  Psych; Normal mood and affect. J Assessment & Plan:  Encounter for Medicare annual examination with abnormal findings Annual exam as documented. Immunization  needs are specifically addressed at this visit.   Essential hypertension Uncontrolled , inc imdur to one daily Re assess in 8 to 10 weeks  Encounter for immunization After obtaining informed consent, the vaccine is  administered , with no adverse  effect noted at the time of administration.

## 2023-08-10 NOTE — Assessment & Plan Note (Signed)
Uncontrolled , inc imdur to one daily Re assess in 8 to 10 weeks

## 2023-08-16 ENCOUNTER — Ambulatory Visit: Payer: Medicare Other

## 2023-08-16 DIAGNOSIS — I495 Sick sinus syndrome: Secondary | ICD-10-CM | POA: Diagnosis not present

## 2023-08-18 LAB — CUP PACEART REMOTE DEVICE CHECK
Battery Remaining Longevity: 77 mo
Battery Remaining Percentage: 71 %
Battery Voltage: 3.01 V
Brady Statistic AP VP Percent: 1 %
Brady Statistic AP VS Percent: 65 %
Brady Statistic AS VP Percent: 1 %
Brady Statistic AS VS Percent: 33 %
Brady Statistic RA Percent Paced: 62 %
Brady Statistic RV Percent Paced: 1 %
Date Time Interrogation Session: 20241111040149
Implantable Lead Connection Status: 753985
Implantable Lead Connection Status: 753985
Implantable Lead Implant Date: 20021126
Implantable Lead Implant Date: 20021126
Implantable Lead Location: 753859
Implantable Lead Location: 753860
Implantable Pulse Generator Implant Date: 20210412
Lead Channel Impedance Value: 400 Ohm
Lead Channel Impedance Value: 480 Ohm
Lead Channel Pacing Threshold Amplitude: 0.5 V
Lead Channel Pacing Threshold Amplitude: 1 V
Lead Channel Pacing Threshold Pulse Width: 0.5 ms
Lead Channel Pacing Threshold Pulse Width: 0.9 ms
Lead Channel Sensing Intrinsic Amplitude: 1.2 mV
Lead Channel Sensing Intrinsic Amplitude: 5.8 mV
Lead Channel Setting Pacing Amplitude: 2 V
Lead Channel Setting Pacing Amplitude: 2.5 V
Lead Channel Setting Pacing Pulse Width: 0.9 ms
Lead Channel Setting Sensing Sensitivity: 2 mV
Pulse Gen Model: 2272
Pulse Gen Serial Number: 3814236

## 2023-08-31 ENCOUNTER — Other Ambulatory Visit: Payer: Self-pay | Admitting: Family Medicine

## 2023-09-09 NOTE — Progress Notes (Signed)
Remote pacemaker transmission.   

## 2023-10-01 ENCOUNTER — Other Ambulatory Visit: Payer: Self-pay | Admitting: Family Medicine

## 2023-10-07 ENCOUNTER — Encounter: Payer: Self-pay | Admitting: Family Medicine

## 2023-10-07 ENCOUNTER — Ambulatory Visit (INDEPENDENT_AMBULATORY_CARE_PROVIDER_SITE_OTHER): Payer: Medicare Other | Admitting: Family Medicine

## 2023-10-07 VITALS — BP 128/72 | HR 66 | Ht 70.0 in | Wt 159.0 lb

## 2023-10-07 DIAGNOSIS — H9193 Unspecified hearing loss, bilateral: Secondary | ICD-10-CM | POA: Diagnosis not present

## 2023-10-07 DIAGNOSIS — R413 Other amnesia: Secondary | ICD-10-CM | POA: Diagnosis not present

## 2023-10-07 DIAGNOSIS — M150 Primary generalized (osteo)arthritis: Secondary | ICD-10-CM

## 2023-10-07 DIAGNOSIS — F411 Generalized anxiety disorder: Secondary | ICD-10-CM | POA: Diagnosis not present

## 2023-10-07 DIAGNOSIS — K219 Gastro-esophageal reflux disease without esophagitis: Secondary | ICD-10-CM | POA: Diagnosis not present

## 2023-10-07 DIAGNOSIS — I10 Essential (primary) hypertension: Secondary | ICD-10-CM

## 2023-10-07 DIAGNOSIS — E7849 Other hyperlipidemia: Secondary | ICD-10-CM

## 2023-10-07 MED ORDER — DONEPEZIL HCL 5 MG PO TABS: 5.0000 mg | ORAL_TABLET | Freq: Every day | ORAL | 1 refills | Status: DC

## 2023-10-07 NOTE — Patient Instructions (Addendum)
 F/U in 7 weeks . MMSE at that visit please, call if you need me before  New to help with memory impairment is donepezil  one at bedtime, dose will be increased to standard treating dose at next visit if you tolerate this  Most common s/e is stomach upset/ loose stool, hope this does not become a problem for you if it does, let me know  PLEASE use walker ALL the time to reduce  your risk of falling   Please start doing  ctivities which utiize your brain tio help with memory and function, like word search, jig saw puzzles , even coloring is good for you  Thanks for choosing [facility], we consider it a privelige to serve you.

## 2023-10-07 NOTE — Assessment & Plan Note (Addendum)
 Family concerned re memory loss, poor performance on cIT, unable to fully complete, dx of mCI since over 10 years Start aricept 5 mg re eval in 7 to 8 weeks

## 2023-10-10 NOTE — Assessment & Plan Note (Signed)
 Increased fall risk due to arthritis, encouraged to be more consistent with using the walker, has had no falls since last visit

## 2023-10-10 NOTE — Assessment & Plan Note (Signed)
 Controlled, no change in medication

## 2023-10-10 NOTE — Progress Notes (Signed)
   YONG WAHLQUIST     MRN: [id number]      DOB: 1925/09/23  Chief Complaint  Patient presents with   Follow-up    Follow up    HPI Ms. Nourse is here for follow up and re-evaluation of chronic medical conditions, medication management and review of any available recent lab and radiology data.  Preventive health is updated, specifically  Cancer screening and Immunization.   Questions or concerns regarding consultations or procedures which the PT has had in the interim are  addressed. The PT denies any adverse reactions to current medications since the last visit.  Family concerned about increased forgetfulness and deterioration in  ROS Denies recent fever or chills. Denies sinus pressure, nasal congestion, ear pain or sore throat. Denies chest congestion, productive cough or wheezing. Denies chest pains, palpitations and leg swelling Denies abdominal pain, nausea, vomiting,diarrhea or constipation.   Denies dysuria, frequency, hesitancy or incontinence. Chronic joint pain, swelling and limitation in mobility. Denies headaches, seizures, numbness, or tingling. Denies uncontrolled depression, anxiety or insomnia. Denies skin break down or rash.   PE  BP 128/72   Pulse 66   Ht 5' 10 (1.778 m)   Wt 159 lb 0.6 oz (72.1 kg)   SpO2 94%   BMI 22.82 kg/m   Patient alert and oriented and in no cardiopulmonary distress.  HEENT: No facial asymmetry, EOMI,     Neck decreased ROM .  Chest: Clear to auscultation bilaterally.  CVS: S1, S2 , no S3.Regular rate.  ABD: Soft non tender.   Ext: No edema  MS: decreased ROM spine, shoulders, hips and knees.  Skin: Intact, no ulcerations or rash noted.  Psych: Good eye contact, normal affect. Memory intact not anxious or depressed appearing.  CNS: CN 2-12 intact, power,  normal throughout.no focal deficits noted.   Assessment & Plan  Memory impairment of gradual onset Family concerned re memory loss, poor performance on cIT, unable  to fully complete, dx of mCI since over 10 years Start aricept  5 mg re eval in 7 to 8 weeks  Hyperlipemia Hyperlipidemia:Low fat diet discussed and encouraged.   Lipid Panel  Lab Results  Component Value Date   CHOL 172 08/02/2023   HDL 87 08/02/2023   LDLCALC 74 08/02/2023   TRIG 57 08/02/2023   CHOLHDL 2.0 08/02/2023     Controlled, no change in medication   Essential hypertension Controlled, no change in medication   GAD (generalized anxiety disorder) Controlled, no change in medication   GERD Controlled, no change in medication   Osteoarthritis Increased fall risk due to arthritis, encouraged to be more consistent with using the walker, has had no falls since last visit

## 2023-10-10 NOTE — Assessment & Plan Note (Signed)
 Hyperlipidemia:Low fat diet discussed and encouraged.   Lipid Panel  Lab Results  Component Value Date   CHOL 172 08/02/2023   HDL 87 08/02/2023   LDLCALC 74 08/02/2023   TRIG 57 08/02/2023   CHOLHDL 2.0 08/02/2023     Controlled, no change in medication

## 2023-10-11 ENCOUNTER — Ambulatory Visit (INDEPENDENT_AMBULATORY_CARE_PROVIDER_SITE_OTHER): Payer: Medicare Other

## 2023-10-11 VITALS — Ht 69.0 in | Wt 159.0 lb

## 2023-10-11 DIAGNOSIS — Z0000 Encounter for general adult medical examination without abnormal findings: Secondary | ICD-10-CM | POA: Diagnosis not present

## 2023-10-11 NOTE — Progress Notes (Addendum)
 Visit completed with the help of patients daughter, Candace Cervantes Interactive audio and video telecommunications were attempted between this provider and patient, however failed, due to patient having technical difficulties OR patient did not have access to video capability.  We continued and completed visit with audio only. Because this visit was a virtual/telehealth visit,  certain criteria was not obtained, such a blood pressure, CBG if applicable, and timed get up and go. Any medications not marked as taking were not mentioned during the medication reconciliation part of the visit. Any vitals not documented were not able to be obtained due to this being a telehealth visit or patient was unable to self-report a recent blood pressure reading due to a lack of equipment at home via telehealth. Vitals that have been documented are verbally provided by the patient.   Please attest and cosign this visit due to patients primary care provider not being in the office at the time the visit was completed.   Subjective:   Candace Cervantes is a 89 y.o. female who presents for Medicare Annual (Subsequent) preventive examination.  Visit Complete: Virtual I connected with  Roxane SHAUNNA Molt on 10/11/23 by a audio enabled telemedicine application and verified that I am speaking with the correct person using two identifiers.  Patient Location: Home  Provider Location: Home Office  I discussed the limitations of evaluation and management by telemedicine. The patient expressed understanding and agreed to proceed.  Vital Signs: Because this visit was a virtual/telehealth visit, some criteria may be missing or patient reported. Any vitals not documented were not able to be obtained and vitals that have been documented are patient reported.  Patient Medicare AWV questionnaire was completed by the patient on na; I have confirmed that all information answered by patient is correct and no changes since this  date.  Cardiac Risk Factors include: advanced age (>80men, >62 women);dyslipidemia;hypertension;sedentary lifestyle     Objective:    Today's Vitals   10/11/23 1239 10/11/23 1241  Weight: 159 lb (72.1 kg)   Height: 5' 9 (1.753 m)   PainSc:  0-No pain   Body mass index is 23.48 kg/m.     10/11/2023   12:38 PM 09/14/2022    3:48 PM 05/26/2022    5:53 PM 09/01/2021   10:24 AM 09/09/2020   12:19 PM 07/30/2020   11:08 AM 01/15/2020   12:01 PM  Advanced Directives  Does Patient Have a Medical Advance Directive? No Yes No No Yes No Yes  Type of Advance Directive  Healthcare Power of Attorney   Living will;Healthcare Power of [employer] Power of Attorney  Does patient want to make changes to medical advance directive?  No - Patient declined       Copy of Healthcare Power of Attorney in Chart?  No - copy requested   No - copy requested    Would patient like information on creating a medical advance directive? No - Patient declined   Yes (ED - Information included in AVS)  No - Patient declined     Current Medications (verified) Outpatient Encounter Medications as of 10/11/2023  Medication Sig   Acetaminophen  (TYLENOL  8 HOUR ARTHRITIS PAIN PO) Take 1 tablet by mouth daily. Sometimes takes 1 extra per day   amLODipine  (NORVASC ) 10 MG tablet TAKE ONE TABLET BY MOUTH ONCE DAILY.   citalopram  (CELEXA ) 10 MG tablet TAKE ONE TABLET BY MOUTH ONCE DAILY.   donepezil  (ARICEPT ) 5 MG tablet Take 1 tablet (5 mg total) by  mouth at bedtime.   dorzolamide-timolol (COSOPT) 22.3-6.8 MG/ML ophthalmic solution 1 drop 2 (two) times daily.   Famotidine  (ACID CONTROLLER PO) Take 10 mg by mouth daily.   hydrochlorothiazide  (MICROZIDE ) 12.5 MG capsule TAKE 1 CAPSULE BY MOUTH ONCE DAILY.   isosorbide  mononitrate (IMDUR ) 30 MG 24 hr tablet Take one tablet by mouth once daily   latanoprost (XALATAN) 0.005 % ophthalmic solution 1 drop daily.   loratadine  (CLARITIN ) 10 MG tablet Take 1 tablet (10 mg total)  by mouth daily as needed for allergies.   LYCOPENE PO Take by mouth.   Multiple Vitamin (MULTIVITAMIN WITH MINERALS) TABS tablet Take 1 tablet by mouth daily.   nitroGLYCERIN  (NITROSTAT ) 0.4 MG SL tablet DISSOLVE 1 TABLET SUBLINGUALLY AS NEEDED FOR CHEST PAIN, MAY REPEAT EVERY 5 MINUTES. AFTER 3 TABLETS CALL 911.   omeprazole  (PRILOSEC) 20 MG capsule TAKE ONE CAPSULE BY MOUTH ONCE DAILY.   potassium chloride  SA (KLOR-CON  M) 20 MEQ tablet TAKE 1 TABLET BY MOUTH DAILY   pravastatin  (PRAVACHOL ) 40 MG tablet TAKE ONE TABLET BY MOUTH ONCE DAILY.   ROCKLATAN 0.02-0.005 % SOLN Apply 1 drop to eye at bedtime.   triamterene -hydrochlorothiazide  (MAXZIDE -25) 37.5-25 MG tablet Take 1 tablet by mouth daily.   potassium chloride  (KLOR-CON ) 20 MEQ packet Take 20 mEq by mouth daily for 5 doses.   No facility-administered encounter medications on file as of 10/11/2023.    Allergies (verified) Patient has no known allergies.   History: Past Medical History:  Diagnosis Date   Anxiety disorder    Arthritis    CAD (coronary artery disease) 2005   Nonobstructive 40% RCA lesion and normal ejection fraction at cath    Chronic headache disorder 10/09/2017   GERD (gastroesophageal reflux disease)    Hyperlipidemia    Hypertension    Osteoporosis    Pacemaker 2002   Permanent Placement   Pacemaker 2007   Generator Changed    Prediabetes    Sinoatrial node dysfunction (HCC)    SSS (sick sinus syndrome) (HCC)    Zenker diverticulum    previously evaluated by Dr. Arlana in 2008.    Past Surgical History:  Procedure Laterality Date   CHOLECYSTECTOMY     COLONOSCOPY  2003   no polyps.   CYSTECTOMY     from back of the neck   ESOPHAGOGASTRODUODENOSCOPY  2008   Dr. Harvey, difficulty passing scope through UES, incomplete fibrous ring at GEJ, hh, multiple benign gastri polyps   ESOPHAGOGASTRODUODENOSCOPY N/A 10/17/2013   ZENKER'S, STRICTURE: 10-12.8 MM,  NSAID GASTRITIS FG POLYPS    ESOPHAGOGASTRODUODENOSCOPY (EGD) WITH ESOPHAGEAL DILATION N/A 10/30/2013   SLF: 1. Zenkers diverticulum with a large opening at the cricopharyngeus 2. 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 Innumerable Fundic gland polyps 4. Mild NSAID gastritis   MALONEY DILATION N/A 10/17/2013   Procedure: MALONEY DILATION;  Surgeon: Margo LITTIE Harvey, MD;  Location: [facility];  Service: Endoscopy;  Laterality: N/A;  with PEDS GASTROSCOPE   PACEMAKER INSERTION  2002   PACEMAKER PLACEMENT  2007   replacement    PPM GENERATOR CHANGEOUT N/A 01/15/2020   Procedure: PPM GENERATOR CHANGEOUT;  Surgeon: Waddell Danelle ORN, MD;  Location: [facility] INVASIVE CV LAB;  Service: Cardiovascular;  Laterality: N/A;   SAVORY DILATION N/A 10/17/2013   Procedure: SAVORY DILATION;  Surgeon: Margo LITTIE Harvey, MD;  Location: [facility];  Service: Endoscopy;  Laterality: N/A;  with PEDS GASTROSCOPE   TOTAL ABDOMINAL HYSTERECTOMY  2002   Family History  Problem Relation  Age of Onset   Heart disease Mother    Lung disease Sister    Diabetes Sister    Alcohol abuse Brother    Cancer Brother        possible prostate   Colon cancer Other        sibling, age 88   Heart attack Other        uncle   Social History   Socioeconomic History   Marital status: Widowed    Spouse name: Not on file   Number of children: 2   Years of education: Not on file   Highest education level: 12th grade  Occupational History   Not on file  Tobacco Use   Smoking status: Never   Smokeless tobacco: Never  Vaping Use   Vaping status: Never Used  Substance and Sexual Activity   Alcohol use: Never   Drug use: Never   Sexual activity: Not Currently  Other Topics Concern   Not on file  Social History Narrative   Lives with daughter Cathryne Hummer    Right handed    Caffeine: coffee 1 cup/day   Social Drivers of Corporate Investment Banker Strain: Low Risk  (10/11/2023)   Overall Financial Resource Strain (CARDIA)    Difficulty of Paying Living  Expenses: Not very hard  Food Insecurity: No Food Insecurity (10/11/2023)   Hunger Vital Sign    Worried About Running Out of Food in the Last Year: Never true    Ran Out of Food in the Last Year: Never true  Transportation Needs: No Transportation Needs (10/11/2023)   PRAPARE - Administrator, Civil Service (Medical): No    Lack of Transportation (Non-Medical): No  Physical Activity: Insufficiently Active (10/11/2023)   Exercise Vital Sign    Days of Exercise per Week: 3 days    Minutes of Exercise per Session: 20 min  Stress: No Stress Concern Present (10/11/2023)   [employer] of Occupational Health - Occupational Stress Questionnaire    Feeling of Stress : Not at all  Social Connections: Moderately Integrated (10/11/2023)   Social Connection and Isolation Panel [NHANES]    Frequency of Communication with Friends and Family: More than three times a week    Frequency of Social Gatherings with Friends and Family: More than three times a week    Attends Religious Services: More than 4 times per year    Active Member of [employer] or Organizations: Yes    Attends Banker Meetings: More than 4 times per year    Marital Status: Widowed    Tobacco Counseling Counseling given: Yes   Clinical Intake:  Pre-visit preparation completed: Yes  Pain : No/denies pain Pain Score: 0-No pain     BMI - recorded: 23.48 Nutritional Status: BMI of 19-24  Normal Nutritional Risks: None Diabetes: No  How often do you need to have someone help you when you read instructions, pamphlets, or other written materials from your doctor or pharmacy?: 1 - Never  Interpreter Needed?: No  Comments: visit completed with the help of patients daughter, Candace McCain Information entered by :: Chemeka Filice, CMA   Activities of Daily Living    10/11/2023   12:57 PM  In your present state of health, do you have any difficulty performing the following activities:  Hearing? 1  Vision? 0   Difficulty concentrating or making decisions? 1  Walking or climbing stairs? 1  Dressing or bathing? 0  Doing errands, shopping? 1  Preparing Food and eating ? Y  Using the Toilet? N  In the past six months, have you accidently leaked urine? Y  Do you have problems with loss of bowel control? N  Managing your Medications? Y  Managing your Finances? Y  Housekeeping or managing your Housekeeping? Y    Patient Care Team: Antonetta Rollene BRAVO, MD as PCP - General Waddell Danelle ORN, MD as PCP - Cardiology (Cardiology) Jerilynn Lamarr HERO, NP as Nurse Practitioner (Nurse Practitioner) Juventino Lamar HERO, MD as Attending Physician (Cardiology) Harvey Margo CROME, MD (Inactive) as Consulting Physician (Gastroenterology) Roz Anes, MD as Consulting Physician (Ophthalmology) Shona Rush, MD as Consulting Physician (Dermatology)  Indicate any recent Medical Services you may have received from other than [facility] providers in the past year (date may be approximate).     Assessment:   This is a routine wellness examination for Tasmia.  Hearing/Vision screen Hearing Screening - Comments:: Patient does have difficulty hearing but refuses hearing aids  Vision Screening - Comments:: Lamar Gaudy Wears rx glasses - up to date with routine eye exams     Goals Addressed             This Visit's Progress    Patient Stated       To stay as healthy and active as possible       Depression Screen    10/11/2023    1:00 PM 10/07/2023    1:14 PM 06/25/2023   11:00 AM 06/25/2023   10:48 AM 02/26/2023    4:00 PM 10/15/2022    3:17 PM 09/14/2022    3:49 PM  PHQ 2/9 Scores  PHQ - 2 Score 0 0 1 0 1 0 4  PHQ- 9 Score   7 0 8 5 4     Fall Risk    10/11/2023   12:57 PM 10/07/2023    1:14 PM 08/10/2023    1:08 PM 06/25/2023   11:00 AM 06/25/2023   10:48 AM  Fall Risk   Falls in the past year? 1 0 0 0 0  Number falls in past yr: 0 0 0 0 0  Injury with Fall? 0 0 0 0 0  Risk for fall due to : History of  fall(s);Impaired balance/gait;Orthopedic patient;Impaired mobility;Impaired vision No Fall Risks  No Fall Risks No Fall Risks  Follow up Education provided;Falls prevention discussed;Falls evaluation completed Falls evaluation completed  Falls evaluation completed Falls evaluation completed    MEDICARE RISK AT HOME: Medicare Risk at Home Any stairs in or around the home?: Yes If so, are there any without handrails?: No Home free of loose throw rugs in walkways, pet beds, electrical cords, etc?: Yes Adequate lighting in your home to reduce risk of falls?: Yes Life alert?: Yes Use of a cane, walker or w/c?: Yes Grab bars in the bathroom?: Yes Shower chair or bench in shower?: Yes Elevated toilet seat or a handicapped toilet?: Yes  TIMED UP AND GO:  Was the test performed?  No    Cognitive Function:    01/25/2018    4:16 PM  MMSE - Mini Mental State Exam  Orientation to time 5  Orientation to Place 5  Registration 3  Attention/ Calculation 5  Recall 1  Language- name 2 objects 2  Language- repeat 1  Language- follow 3 step command 3  Language- read & follow direction 1  Write a sentence 1  Copy design 0  Total score 27        10/11/2023  12:43 PM 10/07/2023    1:50 PM 10/07/2023    1:37 PM 09/14/2022    3:50 PM 09/01/2021   10:22 AM  6CIT Screen  What Year? 4 points  4 points 0 points 0 points  What month? 3 points  0 points 0 points 0 points  What time? 3 points 0 points 0 points 0 points 0 points  Count back from 20 0 points 4 points 4 points 0 points 0 points  Months in reverse 4 points   4 points 4 points  Repeat phrase 10 points   8 points 8 points  Total Score 24 points   12 points 12 points    Immunizations Immunization History  Administered Date(s) Administered   Fluad Quad(high Dose 65+) 07/06/2019, 06/12/2020, 06/26/2021, 08/06/2022   Fluad Trivalent(High Dose 65+) 08/10/2023   H1N1 09/20/2008   Influenza Split 08/11/2011, 06/22/2012   Influenza Whole  06/30/2007, 07/08/2009, 07/09/2010   Influenza,inj,Quad PF,6+ Mos 06/20/2013, 05/28/2014, 06/13/2015, 06/02/2016, 06/14/2017, 06/09/2018   Moderna Sars-Covid-2 Vaccination 10/23/2019, 11/20/2019, 08/08/2020, 07/22/2022   Pneumococcal Conjugate-13 04/03/2014   Pneumococcal Polysaccharide-23 09/04/2003   Td 02/20/2004   Tdap 08/18/2011   Zoster Recombinant(Shingrix) 07/25/2021, 10/14/2021   Zoster, Live 05/24/2008    TDAP status: Due, Education has been provided regarding the importance of this vaccine. Advised may receive this vaccine at local pharmacy or Health Dept. Aware to provide a copy of the vaccination record if obtained from local pharmacy or Health Dept. Verbalized acceptance and understanding.  Flu Vaccine status: Up to date  Pneumococcal vaccine status: Up to date  Covid-19 vaccine status: Information provided on how to obtain vaccines.   Qualifies for Shingles Vaccine? No   Zostavax completed Yes   Shingrix Completed?: Yes  Screening Tests Health Maintenance  Topic Date Due   DTaP/Tdap/Td (3 - Td or Tdap) 08/17/2021   COVID-19 Vaccine (5 - 2024-25 season) 06/06/2023   Medicare Annual Wellness (AWV)  08/09/2024   Pneumonia Vaccine 48+ Years old  Completed   INFLUENZA VACCINE  Completed   DEXA SCAN  Completed   Zoster Vaccines- Shingrix  Completed   HPV VACCINES  Aged Out    Health Maintenance  Health Maintenance Due  Topic Date Due   DTaP/Tdap/Td (3 - Td or Tdap) 08/17/2021   COVID-19 Vaccine (5 - 2024-25 season) 06/06/2023    Colorectal cancer screening: No longer required.   Mammogram status: No longer required due to  .  Bone Density Screening: Not age appropriate for this patient.    Lung Cancer Screening: (Low Dose CT Chest recommended if Age 10-80 years, 20 pack-year currently smoking OR have quit w/in 15years.) does not qualify.   Lung Cancer Screening Referral: na  Additional Screening:  Hepatitis C Screening: does not qualify   Vision  Screening: Recommended annual ophthalmology exams for early detection of glaucoma and other disorders of the eye. Is the patient up to date with their annual eye exam?  Yes  Who is the provider or what is the name of the office in which the patient attends annual eye exams? [facility]aud[facility] If pt is not established with a provider, would they like to be referred to a provider to establish care? No .   Dental Screening: Recommended annual dental exams for proper oral hygiene  Diabetic Foot Exam: na  Community Resource Referral / Chronic Care Management: CRR required this visit?  No   CCM required this visit?  No     Plan:     I  have personally reviewed and noted the following in the patient's chart:   Medical and social history Use of alcohol, tobacco or illicit drugs  Current medications and supplements including opioid prescriptions. Patient is not currently taking opioid prescriptions. Functional ability and status Nutritional status Physical activity Advanced directives List of other physicians Hospitalizations, surgeries, and ER visits in previous 12 months Vitals Screenings to include cognitive, depression, and falls Referrals and appointments  In addition, I have reviewed and discussed with patient certain preventive protocols, quality metrics, and best practice recommendations. A written personalized care plan for preventive services as well as general preventive health recommendations were provided to patient.     [facility] LABOR Bridey Brookover, CMA   10/11/2023   After Visit Summary: (Mail) Due to this being a telephonic visit, the after visit summary with patients personalized plan was offered to patient via mail   Nurse Notes: n/a

## 2023-10-11 NOTE — Patient Instructions (Signed)
 [name] , Thank you for taking time to come for your Medicare Wellness Visit. I appreciate your ongoing commitment to your health goals. Please review the following plan we discussed and let me know if I can assist you in the future.   Referrals/Orders/Follow-Ups/Clinician Recommendations:  Next Medicare Annual Wellness Visit: October 11, 2024 at 10:40 am virtual visit  This is a list of the screening recommended for you and due dates:  Health Maintenance  Topic Date Due   DTaP/Tdap/Td vaccine (3 - Td or Tdap) 08/17/2021   COVID-19 Vaccine (5 - 2024-25 season) 06/06/2023   Medicare Annual Wellness Visit  10/10/2024   Pneumonia Vaccine  Completed   Flu Shot  Completed   DEXA scan (bone density measurement)  Completed   Zoster (Shingles) Vaccine  Completed   HPV Vaccine  Aged Out    Advanced directives: (Declined) Advance directive discussed with you today. Even though you declined this today, please call our office should you change your mind, and we can give you the proper paperwork for you to fill out.  Next Medicare Annual Wellness Visit scheduled for next year: Yes  Preventive Care 88 Years and Older, Female Preventive care refers to lifestyle choices and visits with your health care provider that can promote health and wellness. Preventive care visits are also called wellness exams. What can I expect for my preventive care visit? Counseling Your health care provider may ask you questions about your: Medical history, including: Past medical problems. Family medical history. Pregnancy and menstrual history. History of falls. Current health, including: Memory and ability to understand (cognition). Emotional well-being. Home life and relationship well-being. Sexual activity and sexual health. Lifestyle, including: Alcohol, nicotine or tobacco, and drug use. Access to firearms. Diet, exercise, and sleep habits. Work and work astronomer. Sunscreen use. Safety issues such  as seatbelt and bike helmet use. Physical exam Your health care provider will check your: Height and weight. These may be used to calculate your BMI (body mass index). BMI is a measurement that tells if you are at a healthy weight. Waist circumference. This measures the distance around your waistline. This measurement also tells if you are at a healthy weight and may help predict your risk of certain diseases, such as type 2 diabetes and high blood pressure. Heart rate and blood pressure. Body temperature. Skin for abnormal spots. What immunizations do I need?  Vaccines are usually given at various ages, according to a schedule. Your health care provider will recommend vaccines for you based on your age, medical history, and lifestyle or other factors, such as travel or where you work. What tests do I need? Screening Your health care provider may recommend screening tests for certain conditions. This may include: Lipid and cholesterol levels. Hepatitis C test. Hepatitis B test. HIV (human immunodeficiency virus) test. STI (sexually transmitted infection) testing, if you are at risk. Lung cancer screening. Colorectal cancer screening. Diabetes screening. This is done by checking your blood sugar (glucose) after you have not eaten for a while (fasting). Mammogram. Talk with your health care provider about how often you should have regular mammograms. BRCA-related cancer screening. This may be done if you have a family history of breast, ovarian, tubal, or peritoneal cancers. Bone density scan. This is done to screen for osteoporosis. Talk with your health care provider about your test results, treatment options, and if necessary, the need for more tests. Follow these instructions at home: Eating and drinking  Eat a diet that includes fresh  fruits and vegetables, whole grains, lean protein, and low-fat dairy products. Limit your intake of foods with high amounts of sugar, saturated fats,  and salt. Take vitamin and mineral supplements as recommended by your health care provider. Do not drink alcohol if your health care provider tells you not to drink. If you drink alcohol: Limit how much you have to 0-1 drink a day. Know how much alcohol is in your drink. In the [place] one drink equals one 12 oz bottle of beer (355 mL), one 5 oz glass of wine (148 mL), or one 1 oz glass of hard liquor (44 mL). Lifestyle Brush your teeth every morning and night with fluoride toothpaste. Floss one time each day. Exercise for at least 30 minutes 5 or more days each week. Do not use any products that contain nicotine or tobacco. These products include cigarettes, chewing tobacco, and vaping devices, such as e-cigarettes. If you need help quitting, ask your health care provider. Do not use drugs. If you are sexually active, practice safe sex. Use a condom or other form of protection in order to prevent STIs. Take aspirin  only as told by your health care provider. Make sure that you understand how much to take and what form to take. Work with your health care provider to find out whether it is safe and beneficial for you to take aspirin  daily. Ask your health care provider if you need to take a cholesterol-lowering medicine (statin). Find healthy ways to manage stress, such as: Meditation, yoga, or listening to music. Journaling. Talking to a trusted person. Spending time with friends and family. Minimize exposure to UV radiation to reduce your risk of skin cancer. Safety Always wear your seat belt while driving or riding in a vehicle. Do not drive: If you have been drinking alcohol. Do not ride with someone who has been drinking. When you are tired or distracted. While texting. If you have been using any mind-altering substances or drugs. Wear a helmet and other protective equipment during sports activities. If you have firearms in your house, make sure you follow all gun safety  procedures. What's next? Visit your health care provider once a year for an annual wellness visit. Ask your health care provider how often you should have your eyes and teeth checked. Stay up to date on all vaccines. This information is not intended to replace advice given to you by your health care provider. Make sure you discuss any questions you have with your health care provider. Document Revised: 03/19/2021 Document Reviewed: 03/19/2021 Elsevier Patient Education  2024 [employer]. Understanding Your Risk for Falls Millions of people have serious injuries from falls each year. It is important to understand your risk of falling. Talk with your health care provider about your risk and what you can do to lower it. If you do have a serious fall, make sure to tell your provider. Falling once raises your risk of falling again. How can falls affect me? Serious injuries from falls are common. These include: Broken bones, such as hip fractures. Head injuries, such as traumatic brain injuries (TBI) or concussions. A fear of falling can cause you to avoid activities and stay at home. This can make your muscles weaker and raise your risk for a fall. What can increase my risk? There are a number of risk factors that increase your risk for falling. The more risk factors you have, the higher your risk of falling. Serious injuries from a fall happen most often to people  who are older than 88 years old. Teenagers and young adults ages 27-29 are also at higher risk. Common risk factors include: Weakness in the lower body. Being generally weak or confused due to long-term (chronic) illness. Dizziness or balance problems. Poor vision. Medicines that cause dizziness or drowsiness. These may include: Medicines for your blood pressure, heart, anxiety, insomnia, or swelling (edema). Pain medicines. Muscle relaxants. Other risk factors include: Drinking alcohol. Having had a fall in the past. Having  foot pain or wearing improper footwear. Working at a dangerous job. Having any of the following in your home: Tripping hazards, such as floor clutter or loose rugs. Poor lighting. Pets. Having dementia or memory loss. What actions can I take to lower my risk of falling?     Physical activity Stay physically fit. Do strength and balance exercises. Consider taking a regular class to build strength and balance. Yoga and tai chi are good options. Vision Have your eyes checked every year and your prescription for glasses or contacts updated as needed. Shoes and walking aids Wear non-skid shoes. Wear shoes that have rubber soles and low heels. Do not wear high heels. Do not walk around the house in socks or slippers. Use a cane or walker as told by your provider. Home safety Attach secure railings on both sides of your stairs. Install grab bars for your bathtub, shower, and toilet. Use a non-skid mat in your bathtub or shower. Attach bath mats securely with double-sided, non-slip rug tape. Use good lighting in all rooms. Keep a flashlight near your bed. Make sure there is a clear path from your bed to the bathroom. Use night-lights. Do not use throw rugs. Make sure all carpeting is taped or tacked down securely. Remove all clutter from walkways and stairways, including extension cords. Repair uneven or broken steps and floors. Avoid walking on icy or slippery surfaces. Walk on the grass instead of on icy or slick sidewalks. Use ice melter to get rid of ice on walkways in the winter. Use a cordless phone. Questions to ask your health care provider Can you help me check my risk for a fall? Do any of my medicines make me more likely to fall? Should I take a vitamin D  supplement? What exercises can I do to improve my strength and balance? Should I make an appointment to have my vision checked? Do I need a bone density test to check for weak bones (osteoporosis)? Would it help to use a  cane or a walker? Where to find more information Centers for Disease Control and Prevention, STEADI: tonerpromos.no Community-Based Fall Prevention Programs: tonerpromos.no [employer] on Aging: baseringtones.pl Contact a health care provider if: You fall at home. You are afraid of falling at home. You feel weak, drowsy, or dizzy. This information is not intended to replace advice given to you by your health care provider. Make sure you discuss any questions you have with your health care provider. Document Revised: 05/25/2022 Document Reviewed: 05/25/2022 Elsevier Patient Education  2024 [employer].

## 2023-10-11 NOTE — Addendum Note (Signed)
 Addended by: Recardo Evangelist A on: 10/11/2023 03:24 PM   Modules accepted: Level of Service

## 2023-10-28 ENCOUNTER — Ambulatory Visit: Payer: Medicare Other | Admitting: Podiatry

## 2023-10-28 ENCOUNTER — Encounter: Payer: Self-pay | Admitting: Podiatry

## 2023-10-28 VITALS — Ht 69.0 in | Wt 159.0 lb

## 2023-10-28 DIAGNOSIS — L600 Ingrowing nail: Secondary | ICD-10-CM | POA: Diagnosis not present

## 2023-10-28 DIAGNOSIS — Q828 Other specified congenital malformations of skin: Secondary | ICD-10-CM | POA: Diagnosis not present

## 2023-10-28 DIAGNOSIS — B351 Tinea unguium: Secondary | ICD-10-CM

## 2023-10-28 DIAGNOSIS — M79674 Pain in right toe(s): Secondary | ICD-10-CM | POA: Diagnosis not present

## 2023-10-28 DIAGNOSIS — M79675 Pain in left toe(s): Secondary | ICD-10-CM

## 2023-10-28 NOTE — Patient Instructions (Signed)
 EPSOM SALT FOOT SOAK INSTRUCTIONS  *IF YOU HAVE BEEN PRESCRIBED ANTIBIOTICS, TAKE AS INSTRUCTED UNTIL ALL ARE GONE*  Shopping List:  A. Plain epsom salt (not scented) B. Neosporin Cream C. 1-inch fabric band-aids   Place 1/4 cup of epsom salts in 2 quarts of warm tap water. IF YOU ARE DIABETIC, OR HAVE NEUROPATHY, CHECK THE TEMPERATURE OF THE WATER WITH YOUR ELBOW.   Submerge your foot/feet in the solution and soak for 10-15 minutes.      3.  Next, remove your foot/feet from solution, blot dry the affected area.    4.  Apply light amount of antibiotic cream/ointment and cover with fabric band-aid .  5.  This soak should be done once a day for 3 days.   6.  Monitor for any signs/symptoms of infection such as redness, swelling, odor, drainage, increased pain, or non-healing of digit.   7.  Please do not hesitate to call the office and speak to a Nurse or Doctor if you have questions.   8.  If you experience fever, chills, nightsweats, nausea or vomiting with worsening of digit/foot, please go to the emergency room.

## 2023-10-31 ENCOUNTER — Other Ambulatory Visit: Payer: Self-pay | Admitting: Family Medicine

## 2023-11-05 NOTE — Progress Notes (Signed)
Subjective:  Patient ID: Candace Cervantes, female    DOB: 29-Oct-1924,  MRN: [id number]  Candace Cervantes presents to clinic today for corn(s) b/l feet and painful thick toenails that are difficult to trim. Painful toenails interfere with ambulation. Aggravating factors include wearing enclosed shoe gear. Pain is relieved with periodic professional debridement. Painful corns are aggravated when weightbearing when wearing enclosed shoe gear. Pain is relieved with periodic professional debridement.  Chief Complaint  Patient presents with   Nail Problem    Pt is here for RFC not a diabetic PCP is Dr Lodema Hong and LOV was 2 weeks ago.   New problem(s): Patient relates tenderness to right great toe on today's visit. Denies any redness, drainage or swelling, but it is tender with shoe gear or pressure to area.  PCP is Kerri Perches, MD.  No Known Allergies  Review of Systems: Negative except as noted in the HPI.  Objective:  There were no vitals filed for this visit. Candace Cervantes is a pleasant 89 y.o. female WD, WN in NAD. AAO x 3.  Vascular Examination: Capillary refill time <3 seconds b/l LE. Faintly palpable pedal pulses b/l LE. Digital hair present b/l. No pedal edema b/l. Skin temperature gradient WNL b/l. No varicosities b/l. Marland Kitchen  Dermatological Examination: Pedal skin with normal turgor, texture and tone b/l. No open wounds. No interdigital macerations b/l. Toenails 1-5 b/l thickened, discolored, dystrophic with subungual debris. There is pain on palpation to dorsal aspect of nailplates.   Incurvated nailplate right great toe lateral border(s) with tenderness to palpation. No erythema, no edema, no drainage noted.   Porokeratotic lesion(s) bilateral 5th toes.  No erythema, no edema, no drainage, no fluctuance..  Neurological Examination: Protective sensation intact with 10 gram monofilament b/l LE. Vibratory sensation intact b/l LE.   Musculoskeletal Examination: Normal muscle  strength 5/5 to all lower extremity muscle groups bilaterally. No pain, crepitus or joint limitation noted with ROM b/l LE. No gross bony pedal deformities b/l. Patient using transport chair on today's visit.  Assessment/Plan: 1. Pain due to onychomycosis of toenails of both feet   2. Ingrown toenail without infection   3. Porokeratosis   4. Pain in toes of both feet     Patient was evaluated and treated. All patient's and/or POA's questions/concerns addressed on today's visit.   Toenails 1-5 debrided in length and girth without incident. Porokeratotic lesion(s) L 5th toe and R 5th toe pared with sharp debridement without incident.   -No invasive procedure(s) performed. Offending nail border debrided and curretaged lateral border right hallux utilizing sterile nail nipper and currette. Border(s) cleansed with alcohol and triple antibiotic ointment applied. Dispensed written instructions for once daily epsom salt soaks for 3 days. Call office if there are any concerns. -Patient/POA to call should there be question/concern in the interim.   Continue soft, supportive shoe gear daily. Report any pedal injuries to medical professional. Call office if there are any questions/concerns. -Patient/POA to call should there be question/concern in the interim.   Return in about 3 months (around 01/26/2024).  Candace Cervantes, DPM      [place] LOCATION: [address], [place] [address]  Office [phone]   [facility] LOCATION: [address] [place], [place] [address] Office [phone]

## 2023-11-15 ENCOUNTER — Ambulatory Visit (INDEPENDENT_AMBULATORY_CARE_PROVIDER_SITE_OTHER): Payer: Medicare Other

## 2023-11-15 DIAGNOSIS — I495 Sick sinus syndrome: Secondary | ICD-10-CM

## 2023-11-16 LAB — CUP PACEART REMOTE DEVICE CHECK
Battery Remaining Longevity: 75 mo
Battery Remaining Percentage: 69 %
Battery Voltage: 3.01 V
Brady Statistic AP VP Percent: 1 %
Brady Statistic AP VS Percent: 63 %
Brady Statistic AS VP Percent: 1 %
Brady Statistic AS VS Percent: 35 %
Brady Statistic RA Percent Paced: 61 %
Brady Statistic RV Percent Paced: 1 %
Date Time Interrogation Session: 20250210084720
Implantable Lead Connection Status: 753985
Implantable Lead Connection Status: 753985
Implantable Lead Implant Date: 20021126
Implantable Lead Implant Date: 20021126
Implantable Lead Location: 753859
Implantable Lead Location: 753860
Implantable Pulse Generator Implant Date: 20210412
Lead Channel Impedance Value: 410 Ohm
Lead Channel Impedance Value: 510 Ohm
Lead Channel Pacing Threshold Amplitude: 0.5 V
Lead Channel Pacing Threshold Amplitude: 1 V
Lead Channel Pacing Threshold Pulse Width: 0.5 ms
Lead Channel Pacing Threshold Pulse Width: 0.9 ms
Lead Channel Sensing Intrinsic Amplitude: 1.2 mV
Lead Channel Sensing Intrinsic Amplitude: 5.7 mV
Lead Channel Setting Pacing Amplitude: 2 V
Lead Channel Setting Pacing Amplitude: 2.5 V
Lead Channel Setting Pacing Pulse Width: 0.9 ms
Lead Channel Setting Sensing Sensitivity: 2 mV
Pulse Gen Model: 2272
Pulse Gen Serial Number: 3814236

## 2023-11-18 DIAGNOSIS — H02831 Dermatochalasis of right upper eyelid: Secondary | ICD-10-CM | POA: Diagnosis not present

## 2023-11-18 DIAGNOSIS — H02834 Dermatochalasis of left upper eyelid: Secondary | ICD-10-CM | POA: Diagnosis not present

## 2023-11-18 DIAGNOSIS — H04123 Dry eye syndrome of bilateral lacrimal glands: Secondary | ICD-10-CM | POA: Diagnosis not present

## 2023-11-18 DIAGNOSIS — H401134 Primary open-angle glaucoma, bilateral, indeterminate stage: Secondary | ICD-10-CM | POA: Diagnosis not present

## 2023-11-26 ENCOUNTER — Telehealth: Payer: Self-pay | Admitting: Family Medicine

## 2023-11-26 NOTE — Telephone Encounter (Signed)
 Copied from CRM [id number]. Topic: Appointments - Appointment Cancel/Reschedule >> Nov 26, 2023  8:56 AM Phill Myron wrote: Daughter Gaynelle Adu called and cancelled patients appt, she also wanted the doctor to know they will not be giving patient the medication donepezil (ARICEPT) 5 MG tablet, daughter said it says it will give a person diarrhea.

## 2023-11-29 ENCOUNTER — Other Ambulatory Visit: Payer: Self-pay | Admitting: Family Medicine

## 2023-12-01 ENCOUNTER — Ambulatory Visit: Payer: Medicare Other | Admitting: Family Medicine

## 2023-12-19 ENCOUNTER — Other Ambulatory Visit: Payer: Self-pay | Admitting: Family Medicine

## 2023-12-22 NOTE — Addendum Note (Signed)
 Addended by: Geralyn Flash D on: 12/22/2023 10:57 AM   Modules accepted: Orders

## 2023-12-22 NOTE — Progress Notes (Signed)
 Remote pacemaker transmission.

## 2023-12-28 ENCOUNTER — Other Ambulatory Visit: Payer: Self-pay | Admitting: Family Medicine

## 2023-12-31 ENCOUNTER — Ambulatory Visit (INDEPENDENT_AMBULATORY_CARE_PROVIDER_SITE_OTHER): Payer: Self-pay | Admitting: Family Medicine

## 2023-12-31 VITALS — BP 134/72 | HR 71 | Resp 16 | Ht 69.0 in | Wt 155.4 lb

## 2023-12-31 DIAGNOSIS — M25612 Stiffness of left shoulder, not elsewhere classified: Secondary | ICD-10-CM

## 2023-12-31 DIAGNOSIS — R7302 Impaired glucose tolerance (oral): Secondary | ICD-10-CM | POA: Diagnosis not present

## 2023-12-31 DIAGNOSIS — D539 Nutritional anemia, unspecified: Secondary | ICD-10-CM

## 2023-12-31 DIAGNOSIS — E559 Vitamin D deficiency, unspecified: Secondary | ICD-10-CM

## 2023-12-31 DIAGNOSIS — M19041 Primary osteoarthritis, right hand: Secondary | ICD-10-CM

## 2023-12-31 DIAGNOSIS — R2681 Unsteadiness on feet: Secondary | ICD-10-CM

## 2023-12-31 DIAGNOSIS — E7849 Other hyperlipidemia: Secondary | ICD-10-CM

## 2023-12-31 DIAGNOSIS — M150 Primary generalized (osteo)arthritis: Secondary | ICD-10-CM

## 2023-12-31 DIAGNOSIS — I10 Essential (primary) hypertension: Secondary | ICD-10-CM | POA: Diagnosis not present

## 2023-12-31 DIAGNOSIS — R5382 Chronic fatigue, unspecified: Secondary | ICD-10-CM | POA: Diagnosis not present

## 2023-12-31 DIAGNOSIS — K219 Gastro-esophageal reflux disease without esophagitis: Secondary | ICD-10-CM

## 2023-12-31 DIAGNOSIS — R413 Other amnesia: Secondary | ICD-10-CM

## 2023-12-31 DIAGNOSIS — R399 Unspecified symptoms and signs involving the genitourinary system: Secondary | ICD-10-CM

## 2023-12-31 DIAGNOSIS — M25611 Stiffness of right shoulder, not elsewhere classified: Secondary | ICD-10-CM | POA: Diagnosis not present

## 2023-12-31 DIAGNOSIS — M19042 Primary osteoarthritis, left hand: Secondary | ICD-10-CM

## 2023-12-31 DIAGNOSIS — F411 Generalized anxiety disorder: Secondary | ICD-10-CM

## 2023-12-31 NOTE — Patient Instructions (Addendum)
 F/u in 4 months, call if you need me sooner  Labs today, CBC, lipid, B12, TSH., cmp and egfr, vit D and HBA1C   Urine to be brought in for c/s only please  I recommend in hom pT/oT twice weekly for 6 weeks , daughter, Marlowe Shores to call into office and leave message if this can be done  Thanks for choosing [facility], we consider it a privelige to serve you.

## 2024-01-01 LAB — HEMOGLOBIN A1C
Est. average glucose Bld gHb Est-mCnc: 128 mg/dL
Hgb A1c MFr Bld: 6.1 % — ABNORMAL HIGH (ref 4.8–5.6)

## 2024-01-01 LAB — CMP14+EGFR
ALT: 18 IU/L (ref 0–32)
AST: 27 IU/L (ref 0–40)
Albumin: 4.3 g/dL (ref 3.6–4.6)
Alkaline Phosphatase: 66 IU/L (ref 44–121)
BUN/Creatinine Ratio: 23 (ref 12–28)
BUN: 23 mg/dL (ref 10–36)
Bilirubin Total: 0.3 mg/dL (ref 0.0–1.2)
CO2: 26 mmol/L (ref 20–29)
Calcium: 10.1 mg/dL (ref 8.7–10.3)
Chloride: 102 mmol/L (ref 96–106)
Creatinine, Ser: 0.99 mg/dL (ref 0.57–1.00)
Globulin, Total: 2.7 g/dL (ref 1.5–4.5)
Glucose: 122 mg/dL — ABNORMAL HIGH (ref 70–99)
Potassium: 3.8 mmol/L (ref 3.5–5.2)
Sodium: 140 mmol/L (ref 134–144)
Total Protein: 7 g/dL (ref 6.0–8.5)
eGFR: 52 mL/min/{1.73_m2} — ABNORMAL LOW (ref >59)

## 2024-01-01 LAB — CBC WITH DIFFERENTIAL/PLATELET
Basophils Absolute: 0 10*3/uL (ref 0.0–0.2)
Basos: 0 %
EOS (ABSOLUTE): 0 10*3/uL (ref 0.0–0.4)
Eos: 1 %
Hematocrit: 34.6 % (ref 34.0–46.6)
Hemoglobin: 11.5 g/dL (ref 11.1–15.9)
Immature Grans (Abs): 0 10*3/uL (ref 0.0–0.1)
Immature Granulocytes: 0 %
Lymphocytes Absolute: 1.9 10*3/uL (ref 0.7–3.1)
Lymphs: 43 %
MCH: 31.7 pg (ref 26.6–33.0)
MCHC: 33.2 g/dL (ref 31.5–35.7)
MCV: 95 fL (ref 79–97)
Monocytes Absolute: 0.3 10*3/uL (ref 0.1–0.9)
Monocytes: 7 %
Neutrophils Absolute: 2.2 10*3/uL (ref 1.4–7.0)
Neutrophils: 49 %
Platelets: 164 10*3/uL (ref 150–450)
RBC: 3.63 x10E6/uL — ABNORMAL LOW (ref 3.77–5.28)
RDW: 13.5 % (ref 11.7–15.4)
WBC: 4.4 10*3/uL (ref 3.4–10.8)

## 2024-01-01 LAB — LIPID PANEL
Chol/HDL Ratio: 2.1 ratio (ref 0.0–4.4)
Cholesterol, Total: 165 mg/dL (ref 100–199)
HDL: 80 mg/dL (ref >39)
LDL Chol Calc (NIH): 74 mg/dL (ref 0–99)
Triglycerides: 56 mg/dL (ref 0–149)
VLDL Cholesterol Cal: 11 mg/dL (ref 5–40)

## 2024-01-01 LAB — VITAMIN D 25 HYDROXY (VIT D DEFICIENCY, FRACTURES): Vit D, 25-Hydroxy: 48.7 ng/mL (ref 30.0–100.0)

## 2024-01-01 LAB — TSH: TSH: 1.53 u[IU]/mL (ref 0.450–4.500)

## 2024-01-01 LAB — VITAMIN B12: Vitamin B-12: 1358 pg/mL — ABNORMAL HIGH (ref 232–1245)

## 2024-01-03 ENCOUNTER — Other Ambulatory Visit: Payer: Self-pay

## 2024-01-03 ENCOUNTER — Telehealth: Payer: Self-pay | Admitting: Family Medicine

## 2024-01-03 NOTE — Telephone Encounter (Signed)
 Patient daughter came by to get a message to Dr Lodema Hong.  Have her go ahead and order physical therapy at home.  Said she is interested in doing this at home.

## 2024-01-04 ENCOUNTER — Encounter: Payer: Self-pay | Admitting: Family Medicine

## 2024-01-04 DIAGNOSIS — M25611 Stiffness of right shoulder, not elsewhere classified: Secondary | ICD-10-CM | POA: Insufficient documentation

## 2024-01-04 DIAGNOSIS — R399 Unspecified symptoms and signs involving the genitourinary system: Secondary | ICD-10-CM | POA: Insufficient documentation

## 2024-01-04 MED ORDER — OMEPRAZOLE 20 MG PO CPDR: 20.0000 mg | DELAYED_RELEASE_CAPSULE | Freq: Every day | ORAL | 5 refills | Status: DC

## 2024-01-04 MED ORDER — CITALOPRAM HYDROBROMIDE 10 MG PO TABS: 10.0000 mg | ORAL_TABLET | Freq: Every day | ORAL | 5 refills | Status: DC

## 2024-01-04 NOTE — Assessment & Plan Note (Signed)
 Check B12 and TSh due to increased fatigue

## 2024-01-04 NOTE — Assessment & Plan Note (Signed)
 Deformity with cysts , pt reassured that areas of cocern on hands are related to arthritis

## 2024-01-04 NOTE — Assessment & Plan Note (Signed)
 Decision against aricept taken by family due to s/e potential, which is appropriate based on agwe and overall function

## 2024-01-04 NOTE — Progress Notes (Signed)
 Candace Cervantes     MRN: [id number]      DOB: Jun 20, 1925  Chief Complaint  Patient presents with   Fatigue    Wants to know if she can take tonic or something to help with her tiredness. Concerned about her lack of hydration and wants her levels checked    Urinary Tract Infection    Wants her to be checked for UTI, no dysuria or symptoms but has reported seeing bugs and telling her daughter they are going in her privates   Medication Problem    Pt is staying with her other daughter and she wants to discontinue the aricept because it has been causing loose bowels    cysts    Has little cysts on both wrist that come and go, not painful     HPI Candace Cervantes is here for follow up and re-evaluation of chronic medical conditions, medication management and review of any available recent lab and radiology data.  Preventive health is updated, specifically  Cancer screening and Immunization.   Questions or concerns regarding consultations or procedures which the PT has had in the interim are  addressed. The PT denies any adverse reactions to current medications since the last visit.  Concerns as above C/o increased stiffness and pain and reduced mobility C/o lack of energy and fatigue Other concerns as above  ROS Denies recent fever or chills. Denies sinus pressure, nasal congestion, ear pain or sore throat. Denies chest congestion, productive cough or wheezing. Denies chest pains, palpitations and leg swelling Denies abdominal pain, nausea, vomiting,diarrhea or constipation.   Denies dysuria, frequency, hesitancy or incontinence. Denies skin break down or rash.   PE  BP 134/72   Pulse 71   Resp 16   Ht 5\' 9"  (1.753 m)   Wt 155 lb 6.4 oz (70.5 kg)   SpO2 96%   BMI 22.95 kg/m   Patient alert and oriented and in no cardiopulmonary distress.  HEENT: No facial asymmetry, EOMI,     Neck decreased ROM Chest: Clear to auscultation bilaterally.  CVS: S1, S2 no murmurs, no S3.Regular  rate.  ABD: Soft non tender.   Ext: No edema  MS: markedly decreased  ROM spine, shoulders, hips and knees.  Skin: Intact, no ulcerations or rash noted.  Psych: Good eye contact, normal affect. Memory impaired not  anxious mildly  depressed appearing.  CNS: CN 2-12 intact, power,  normal throughout.no focal deficits noted.   Assessment & Plan  Unsteady gait Increased difficulty with ambulation, getting up out of chair/ changing position, unsteady with fear of falling, in home PT twice weekly for 6 weeks  Shoulder joint stiffness, bilateral Stiffness , pain and reduced mobility in both shoulders with upper extremity weakness , refer in home OT twice weekly x 6 weeks  Osteoarthritis Generalized osteoarghtirtis with impaired mobiliy, stiffness and high   fall risk refer PT/OT x 6 weeks in home  Essential hypertension Controlled, no change in medication   UTI symptoms Specimen to be brought to office for c/s only  Memory impairment of gradual onset Decision against aricept taken by family due to s/e potential, which is appropriate based on agwe and overall function   Hyperlipemia Hyperlipidemia:Low fat diet discussed and encouraged.   Lipid Panel  Lab Results  Component Value Date   CHOL 165 12/31/2023   HDL 80 12/31/2023   LDLCALC 74 12/31/2023   TRIG 56 12/31/2023   CHOLHDL 2.1 12/31/2023     Controlled, no change  in medication   Fatigue Check B12 and TSh due to increased fatigue  IGT (impaired glucose tolerance) Patient educated about the importance of limiting  Carbohydrate intake , the need to commit to daily physical activity for a minimum of 30 minutes , and to commit weight loss. The fact that changes in all these areas will reduce or eliminate all together the development of diabetes is stressed.      Latest Ref Rng & Units 12/31/2023    4:39 PM 08/02/2023    9:43 AM 06/25/2023   11:28 AM 10/09/2022   10:28 AM 05/26/2022    7:27 PM  Diabetic Labs   HbA1c 4.8 - 5.6 % 6.1  6.2   5.9    Chol 100 - 199 mg/dL 161  096   045    HDL >40 mg/dL 80  87   61    Calc LDL 0 - 99 mg/dL 74  74   70    Triglycerides 0 - 149 mg/dL 56  57   55    Creatinine 0.57 - 1.00 mg/dL 9.81  1.91  4.78  2.95  0.98       12/31/2023    4:00 PM 10/28/2023    1:48 PM 10/11/2023   12:39 PM 10/07/2023    1:53 PM 10/07/2023    1:14 PM 10/07/2023    1:13 PM 08/10/2023    1:57 PM  BP/Weight  Systolic BP 134  -- 128 142 150 160  Diastolic BP 72  -- 72 72 65 84  Wt. (Lbs) 155.4 159 159   159.04   BMI 22.95 kg/m2 23.48 kg/m2 23.48 kg/m2   22.82 kg/m2        No data to display          Updated lab needed at/ before next visit.   Arthritis of both hands Deformity with cysts , pt reassured that areas of cocern on hands are related to arthritis  GERD Controlled, no change in medication   GAD (generalized anxiety disorder) Controlled, no change in medication

## 2024-01-04 NOTE — Assessment & Plan Note (Signed)
 Controlled, no change in medication

## 2024-01-04 NOTE — Assessment & Plan Note (Signed)
 Hyperlipidemia:Low fat diet discussed and encouraged.   Lipid Panel  Lab Results  Component Value Date   CHOL 165 12/31/2023   HDL 80 12/31/2023   LDLCALC 74 12/31/2023   TRIG 56 12/31/2023   CHOLHDL 2.1 12/31/2023     Controlled, no change in medication

## 2024-01-04 NOTE — Assessment & Plan Note (Signed)
 Specimen to be brought to office for c/s only

## 2024-01-04 NOTE — Assessment & Plan Note (Signed)
 Stiffness , pain and reduced mobility in both shoulders with upper extremity weakness , refer in home OT twice weekly x 6 weeks

## 2024-01-04 NOTE — Assessment & Plan Note (Signed)
 Generalized osteoarghtirtis with impaired mobiliy, stiffness and high   fall risk refer PT/OT x 6 weeks in home

## 2024-01-04 NOTE — Assessment & Plan Note (Signed)
 Increased difficulty with ambulation, getting up out of chair/ changing position, unsteady with fear of falling, in home PT twice weekly for 6 weeks

## 2024-01-04 NOTE — Assessment & Plan Note (Signed)
 Patient educated about the importance of limiting  Carbohydrate intake , the need to commit to daily physical activity for a minimum of 30 minutes , and to commit weight loss. The fact that changes in all these areas will reduce or eliminate all together the development of diabetes is stressed.      Latest Ref Rng & Units 12/31/2023    4:39 PM 08/02/2023    9:43 AM 06/25/2023   11:28 AM 10/09/2022   10:28 AM 05/26/2022    7:27 PM  Diabetic Labs  HbA1c 4.8 - 5.6 % 6.1  6.2   5.9    Chol 100 - 199 mg/dL 161  096   045    HDL >40 mg/dL 80  87   61    Calc LDL 0 - 99 mg/dL 74  74   70    Triglycerides 0 - 149 mg/dL 56  57   55    Creatinine 0.57 - 1.00 mg/dL 9.81  1.91  4.78  2.95  0.98       12/31/2023    4:00 PM 10/28/2023    1:48 PM 10/11/2023   12:39 PM 10/07/2023    1:53 PM 10/07/2023    1:14 PM 10/07/2023    1:13 PM 08/10/2023    1:57 PM  BP/Weight  Systolic BP 134  -- 128 142 150 160  Diastolic BP 72  -- 72 72 65 84  Wt. (Lbs) 155.4 159 159   159.04   BMI 22.95 kg/m2 23.48 kg/m2 23.48 kg/m2   22.82 kg/m2        No data to display          Updated lab needed at/ before next visit.

## 2024-01-05 ENCOUNTER — Other Ambulatory Visit: Payer: Self-pay

## 2024-01-05 DIAGNOSIS — R2681 Unsteadiness on feet: Secondary | ICD-10-CM

## 2024-01-05 DIAGNOSIS — M25511 Pain in right shoulder: Secondary | ICD-10-CM

## 2024-01-05 NOTE — Telephone Encounter (Signed)
 Pt referred for home health PT

## 2024-01-10 ENCOUNTER — Other Ambulatory Visit: Payer: Self-pay | Admitting: Family Medicine

## 2024-01-12 ENCOUNTER — Telehealth: Payer: Self-pay

## 2024-01-12 NOTE — Telephone Encounter (Signed)
 Copied from CRM [id number]. Topic: Clinical - Lab/Test Results >> Jan 12, 2024  9:21 AM Franchot Heidelberg wrote: Reason for CRM: Questions about lab results  Pt's daughter requesting call back about culture results

## 2024-01-13 LAB — URINE CULTURE

## 2024-01-13 MED ORDER — CIPROFLOXACIN HCL 500 MG PO TABS
500.0000 mg | ORAL_TABLET | Freq: Two times a day (BID) | ORAL | 0 refills | Status: AC
Start: 2024-01-13 — End: 2024-01-16

## 2024-01-13 NOTE — Addendum Note (Signed)
 Addended by: [name] E on: 01/13/2024 10:02 AM   Modules accepted: Orders

## 2024-01-13 NOTE — Telephone Encounter (Signed)
 See result note.

## 2024-01-27 ENCOUNTER — Ambulatory Visit: Payer: Medicare Other | Admitting: Podiatry

## 2024-01-27 ENCOUNTER — Other Ambulatory Visit: Payer: Self-pay | Admitting: Family Medicine

## 2024-01-28 ENCOUNTER — Other Ambulatory Visit: Payer: Self-pay | Admitting: Family Medicine

## 2024-02-07 ENCOUNTER — Encounter: Payer: Self-pay | Admitting: Podiatry

## 2024-02-07 ENCOUNTER — Ambulatory Visit: Admitting: Podiatry

## 2024-02-07 DIAGNOSIS — M79675 Pain in left toe(s): Secondary | ICD-10-CM | POA: Diagnosis not present

## 2024-02-07 DIAGNOSIS — B351 Tinea unguium: Secondary | ICD-10-CM

## 2024-02-07 DIAGNOSIS — Q828 Other specified congenital malformations of skin: Secondary | ICD-10-CM | POA: Diagnosis not present

## 2024-02-07 DIAGNOSIS — M79674 Pain in right toe(s): Secondary | ICD-10-CM

## 2024-02-12 ENCOUNTER — Encounter: Payer: Self-pay | Admitting: Podiatry

## 2024-02-12 NOTE — Progress Notes (Signed)
  Subjective:  Patient ID: Candace Cervantes, female    DOB: [birth year],  MRN: [id number]  Candace Cervantes presents to clinic today for corn(s) of both feet and painful mycotic toenails that are difficult to trim. Painful toenails interfere with ambulation. Aggravating factors include wearing enclosed shoe gear. Pain is relieved with periodic professional debridement. Painful corns are aggravated when weightbearing when wearing enclosed shoe gear. Pain is relieved with periodic professional debridement.  Chief Complaint  Patient presents with   Nail Problem    "Trim her toenails and file her corns."   New problem(s): None.   PCP is Towanda Fret, MD.  No Known Allergies  Review of Systems: Negative except as noted in the HPI.  Objective: No changes noted in today's physical examination. There were no vitals filed for this visit. Candace Cervantes is a pleasant 89 y.o. female WD, WN in NAD. AAO x 3.  Vascular Examination: Capillary refill time <3 seconds b/l LE. Faintly palpable pedal pulses b/l LE. Digital hair present b/l. No pedal edema b/l. Skin temperature gradient WNL b/l. No varicosities b/l. Candace Cervantes  Dermatological Examination: Pedal skin with normal turgor, texture and tone b/l. No open wounds. No interdigital macerations b/l. Toenails 1-5 b/l thickened, discolored, dystrophic with subungual debris. There is pain on palpation to dorsal aspect of nailplates.   Incurvated nailplate right great toe lateral border(s) with tenderness to palpation. No erythema, no edema, no drainage noted.   Porokeratotic lesion(s) bilateral 5th toes.  No erythema, no edema, no drainage, no fluctuance..  Neurological Examination: Protective sensation intact with 10 gram monofilament b/l LE. Vibratory sensation intact b/l LE.   Musculoskeletal Examination: Normal muscle strength 5/5 to all lower extremity muscle groups bilaterally. No pain, crepitus or joint limitation noted with ROM b/l LE. No gross bony  pedal deformities b/l. Patient using transport chair on today's visit.  Assessment/Plan: 1. Pain due to onychomycosis of toenails of both feet   2. Porokeratosis    -Patient's family member present. All questions/concerns addressed on today's visit. -Toenails 2-5 bilaterally debrided in length and girth without iatrogenic bleeding with sterile nail nipper and dremel.  -No invasive procedure(s) performed. Offending nail border debrided and curretaged right great toe utilizing sterile nail nipper and currette. Border(s) cleansed with alcohol and triple antibiotic ointment applied. Dispensed written instructions for once daily epsom salt soaks for 1 days. Call office if there are any concerns. -Porokeratotic lesion(s) bilateral 5th toes pared and enucleated with sterile currette without incident. Total number of lesions debrided=2. -Patient/POA to call should there be question/concern in the interim.   Return in about 3 months (around 05/09/2024).  Candace Cervantes, DPM      [facility] LOCATION: [address] [place] [address]                   Office [phone]   [facility] LOCATION: [address] [place], [place] [address] Office [phone]

## 2024-02-14 ENCOUNTER — Ambulatory Visit (INDEPENDENT_AMBULATORY_CARE_PROVIDER_SITE_OTHER): Payer: Medicare Other

## 2024-02-14 DIAGNOSIS — I495 Sick sinus syndrome: Secondary | ICD-10-CM | POA: Diagnosis not present

## 2024-02-15 ENCOUNTER — Ambulatory Visit: Payer: Self-pay | Admitting: Internal Medicine

## 2024-02-15 LAB — CUP PACEART REMOTE DEVICE CHECK
Battery Remaining Longevity: 73 mo
Battery Remaining Percentage: 67 %
Battery Voltage: 3.01 V
Brady Statistic AP VP Percent: 1 %
Brady Statistic AP VS Percent: 61 %
Brady Statistic AS VP Percent: 1 %
Brady Statistic AS VS Percent: 38 %
Brady Statistic RA Percent Paced: 59 %
Brady Statistic RV Percent Paced: 1 %
Date Time Interrogation Session: 20250512022957
Implantable Lead Connection Status: 753985
Implantable Lead Connection Status: 753985
Implantable Lead Implant Date: 20021126
Implantable Lead Implant Date: 20021126
Implantable Lead Location: 753859
Implantable Lead Location: 753860
Implantable Pulse Generator Implant Date: 20210412
Lead Channel Impedance Value: 410 Ohm
Lead Channel Impedance Value: 490 Ohm
Lead Channel Pacing Threshold Amplitude: 0.5 V
Lead Channel Pacing Threshold Amplitude: 1 V
Lead Channel Pacing Threshold Pulse Width: 0.5 ms
Lead Channel Pacing Threshold Pulse Width: 0.9 ms
Lead Channel Sensing Intrinsic Amplitude: 1.2 mV
Lead Channel Sensing Intrinsic Amplitude: 5.8 mV
Lead Channel Setting Pacing Amplitude: 2 V
Lead Channel Setting Pacing Amplitude: 2.5 V
Lead Channel Setting Pacing Pulse Width: 0.9 ms
Lead Channel Setting Sensing Sensitivity: 2 mV
Pulse Gen Model: 2272
Pulse Gen Serial Number: 3814236

## 2024-02-16 ENCOUNTER — Other Ambulatory Visit: Payer: Self-pay | Admitting: Family Medicine

## 2024-02-29 ENCOUNTER — Other Ambulatory Visit: Payer: Self-pay | Admitting: Family Medicine

## 2024-03-02 ENCOUNTER — Other Ambulatory Visit: Payer: Self-pay | Admitting: Family Medicine

## 2024-03-07 ENCOUNTER — Ambulatory Visit: Payer: Self-pay

## 2024-03-07 NOTE — Telephone Encounter (Addendum)
  Chief Complaint: Skin bumps - fluid filled, Itching Symptoms: above Frequency: 3 days Pertinent Negatives: Patient denies fever - spreading Disposition: [] ED /[] Urgent Care (no appt availability in office) / [x] Appointment(In office/virtual)/ []  [facility]/ [] Home Care/ [] Refused Recommended Disposition /[] [facility]/ []  Follow-up with PCP Additional Notes: Call from pts' daughter Roselyn. Pt lives with other daughter. Roselyn states that pt started with itchy fluid filled bumps of unknown size about 3 days ago. Fluid may be pus - she is unsure.  Unsure of any new lotions, detergent, etc. They will try some hydrocortisone cream or antihistamine cream. They may try 2nd generation oral antihistamine at very low dose.   Daughter states that appt on Thursday is fine as she has other appts that she need to go to . Pt does not have Mychart for [facility]. Advised UC if any changes to pt's condition.  Copied from CRM [id number]. Topic: Clinical - Red Word Triage >> Mar 07, 2024  9:43 AM Dorthula Gavel H wrote: Red Word that prompted transfer to Nurse Triage: Pt's daughter Roselyn called in stating her mom has bumps that multiplying all over her arms that are itchy and seem to have water /puss inside of them. Reason for Disposition  [1] Widespread itching AND [2] cause unknown AND [3] present > 48 hours  (Exception: Caller knows the cause and can eliminate it.)  Answer Assessment - Initial Assessment Questions 1. DESCRIPTION: "Describe the itching you are having."     Bumps on skin 2. SEVERITY: "How bad is it?"    - MILD: Doesn't interfere with normal activities.   - MODERATE-SEVERE: Interferes with work, school, sleep, or other activities.      Mild - moderate 4. ONSET: "When did this begin?"      3 days ago 5. CAUSE: "What do you think is causing the itching?" (ask about swimming pools, pollen, animals, soaps, etc.)     unsure 6. OTHER SYMPTOMS: "Do you have any other symptoms?"      Bumps on  skin with water  ore possibly pus filled 7. PREGNANCY: "Is there any chance you are pregnant?" "When was your last menstrual period?"     no  Protocols used: Itching - [facility]

## 2024-03-09 ENCOUNTER — Encounter: Payer: Self-pay | Admitting: Family Medicine

## 2024-03-09 ENCOUNTER — Ambulatory Visit (INDEPENDENT_AMBULATORY_CARE_PROVIDER_SITE_OTHER): Admitting: Family Medicine

## 2024-03-09 VITALS — BP 141/65 | HR 78 | Resp 16 | Ht 69.0 in | Wt 149.0 lb

## 2024-03-09 DIAGNOSIS — L309 Dermatitis, unspecified: Secondary | ICD-10-CM | POA: Insufficient documentation

## 2024-03-09 DIAGNOSIS — R399 Unspecified symptoms and signs involving the genitourinary system: Secondary | ICD-10-CM | POA: Diagnosis not present

## 2024-03-09 DIAGNOSIS — G4489 Other headache syndrome: Secondary | ICD-10-CM | POA: Diagnosis not present

## 2024-03-09 MED ORDER — TRIAMCINOLONE ACETONIDE 0.1 % EX CREA: 1.0000 | TOPICAL_CREAM | Freq: Two times a day (BID) | CUTANEOUS | 0 refills | Status: DC

## 2024-03-09 NOTE — Assessment & Plan Note (Addendum)
 The patient complains of a mild, intermittent headache. She denies any red flag symptoms, including nausea, vomiting, or severe intensity. She also denies this being the worst headache of her life. She reports taking 1-2 Tylenol  tablets as needed, which provides relief. The patient admits to minimal fluid intake. She is encouraged to increase her fluid consumption and to continue taking Tylenol  as needed for headache relief.

## 2024-03-09 NOTE — Patient Instructions (Addendum)
 I appreciate the opportunity to provide care to you today!  Headache -continue taking tylenol  as needed or mild headaches Please seek immediate medical attention at the Emergency Department if you experience any of the following symptoms in conjunction with your headaches:  Acute onset of a severe headache described as "the worst headache of my life," particularly in a patient with no prior history of headaches. Unrelenting headaches that do not improve with conservative treatments or pain that progressively worsens. Lancinating or "ice-pick" pain. Severe headaches accompanied by a stiff neck and/or fever. Headaches associated with changes in mental status or level of consciousness. Persistent headaches following trauma to the head or neck. A significant change in pattern or severity of headaches in a patient with a longstanding history of chronic headaches. Your prompt evaluation is crucial for appropriate diagnosis and management.   Dermatitis -Please apply Kenalog 0.1% cream to the affected area twice daily (BID) as needed to relieve symptoms of itching.  -Do not apply for longer than 2 weeks to avoid skin thinning or other side effects.  . Preventative Measures for UTIs:  Avoid Prolonged Urination: Do not hold urine for extended periods, as this can stretch the bladder and create an environment conducive to bacterial growth. Prompt Bladder Emptying: Empty the bladder as soon as you feel the urge to prevent bacterial buildup. Post-Intercourse Hygiene: Empty the bladder soon after intercourse to reduce the risk of infection. Shower Instead of Bath: Opt for showers rather than baths to minimize bacterial exposure. Proper Wiping Technique: Always wipe from front to back after urinating or bowel movements to prevent the transfer of bacteria from the anal region to the urethra or vagina. Hydration: Drink plenty of water  regularly to help flush bacteria from the urinary system. The patient  verbalized understanding of the treatment plan and preventative measures. Follow-up will be scheduled based on the culture results or if symptoms persist or worsen.   Please follow up if your symptoms worsen or fail to improve.     Please continue to a heart-healthy diet and increase your physical activities. Try to exercise for at least five days a week.    It was a pleasure to see you and I look forward to continuing to work together on your health and well-being. Please do not hesitate to call the office if you need care or have questions about your care.  In case of emergency, please visit the Emergency Department for urgent care, or contact our clinic at [phone] to schedule an appointment. We're here to help you!   Have a wonderful day and week. With Gratitude, Tannon Peerson MSN, FNP-BC

## 2024-03-09 NOTE — Assessment & Plan Note (Signed)
 Pending urinalysis  Preventative Measures Discussed for UTIs:  Avoid Prolonged Urination: Do not hold urine for extended periods, as this can stretch the bladder and create an environment conducive to bacterial growth. Prompt Bladder Emptying: Empty the bladder as soon as you feel the urge to prevent bacterial buildup. Post-Intercourse Hygiene: Empty the bladder soon after intercourse to reduce the risk of infection. Shower Instead of Bath: Opt for showers rather than baths to minimize bacterial exposure. Proper Wiping Technique: Always wipe from front to back after urinating or bowel movements to prevent the transfer of bacteria from the anal region to the urethra or vagina. Hydration: Drink plenty of water  regularly to help flush bacteria from the urinary system. The patient verbalized understanding of the treatment plan and preventative measures. Follow-up will be scheduled based on the culture results or if symptoms persist or worsen.

## 2024-03-09 NOTE — Assessment & Plan Note (Signed)
 Encouraged to start applying Kenalog 0.1% cream to the affected area twice daily (BID) as needed to relieve symptoms of itching. Advised not to apply for longer than 2 weeks to avoid skin thinning and other potential side effects.

## 2024-03-09 NOTE — Progress Notes (Signed)
 Acute Office Visit  Subjective:    Patient ID: Candace Cervantes, female    DOB: 03-29-1925, 89 y.o.   MRN: [id number]  Chief Complaint  Patient presents with   Rash    Has some bumps on her left arm that itch.    Headache    States she keeps a headache that bothers her all the time    Urinary Tract Infection    Daughter wants her checked for UTI and she will take the cup home to collect the urine if you will place the order    HPI Patient is in today with the above complaints. For the details of today's visit, please refer to the assessment and plan.     Past Medical History:  Diagnosis Date   Anxiety disorder    Arthritis    CAD (coronary artery disease) 2005   Nonobstructive 40% RCA lesion and normal ejection fraction at cath    Chronic headache disorder 10/09/2017   GERD (gastroesophageal reflux disease)    Hyperlipidemia    Hypertension    Osteoporosis    Pacemaker 2002   Permanent Placement   Pacemaker 2007   Generator Changed    Prediabetes    Sinoatrial node dysfunction (HCC)    SSS (sick sinus syndrome) (HCC)    Zenker diverticulum    previously evaluated by Dr. Archer Kobs in 2008.     Past Surgical History:  Procedure Laterality Date   CHOLECYSTECTOMY     COLONOSCOPY  2003   no polyps.   CYSTECTOMY     from back of the neck   ESOPHAGOGASTRODUODENOSCOPY  2008   Dr. Nolene Baumgarten, difficulty passing scope through UES, incomplete fibrous ring at GEJ, hh, multiple benign gastri polyps   ESOPHAGOGASTRODUODENOSCOPY N/A 10/17/2013   ZENKER'S, STRICTURE: 10-12.8 MM,  NSAID GASTRITIS FG POLYPS   ESOPHAGOGASTRODUODENOSCOPY (EGD) WITH ESOPHAGEAL DILATION N/A 10/30/2013   SLF: 1. Zenkers diverticulum with a large opening at the cricopharyngeus 2. 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 Innumerable Fundic gland polyps 4. Mild NSAID gastritis   MALONEY DILATION N/A 10/17/2013   Procedure: MALONEY DILATION;  Surgeon: Alyce Jubilee, MD;  Location: [facility];  Service: Endoscopy;   Laterality: N/A;  with PEDS GASTROSCOPE   PACEMAKER INSERTION  2002   PACEMAKER PLACEMENT  2007   replacement    PPM GENERATOR CHANGEOUT N/A 01/15/2020   Procedure: PPM GENERATOR CHANGEOUT;  Surgeon: Tammie Fall, MD;  Location: [facility] INVASIVE CV LAB;  Service: Cardiovascular;  Laterality: N/A;   SAVORY DILATION N/A 10/17/2013   Procedure: SAVORY DILATION;  Surgeon: Alyce Jubilee, MD;  Location: [facility] SUITE;  Service: Endoscopy;  Laterality: N/A;  with PEDS GASTROSCOPE   TOTAL ABDOMINAL HYSTERECTOMY  2002    Family History  Problem Relation Age of Onset   Heart disease Mother    Lung disease Sister    Diabetes Sister    Alcohol abuse Brother    Cancer Brother        possible prostate   Colon cancer Other        sibling, age 89   Heart attack Other        uncle    Social History   Socioeconomic History   Marital status: Widowed    Spouse name: Not on file   Number of children: 2   Years of education: Not on file   Highest education level: 12th grade  Occupational History   Not on file  Tobacco Use  Smoking status: Never   Smokeless tobacco: Never  Vaping Use   Vaping status: Never Used  Substance and Sexual Activity   Alcohol use: Never   Drug use: Never   Sexual activity: Not Currently  Other Topics Concern   Not on file  Social History Narrative   Lives with daughter Vanita Gens    Right handed    Caffeine: coffee 1 cup/day   Social Drivers of Corporate investment banker Strain: Low Risk  (10/11/2023)   Overall Financial Resource Strain (CARDIA)    Difficulty of Paying Living Expenses: Not very hard  Food Insecurity: No Food Insecurity (10/11/2023)   Hunger Vital Sign    Worried About Running Out of Food in the Last Year: Never true    Ran Out of Food in the Last Year: Never true  Transportation Needs: No Transportation Needs (10/11/2023)   PRAPARE - Administrator, Civil Service (Medical): No    Lack of Transportation (Non-Medical): No   Physical Activity: Insufficiently Active (10/11/2023)   Exercise Vital Sign    Days of Exercise per Week: 3 days    Minutes of Exercise per Session: 20 min  Stress: No Stress Concern Present (10/11/2023)   [employer] of Occupational Health - Occupational Stress Questionnaire    Feeling of Stress : Not at all  Social Connections: Moderately Integrated (10/11/2023)   Social Connection and Isolation Panel [NHANES]    Frequency of Communication with Friends and Family: More than three times a week    Frequency of Social Gatherings with Friends and Family: More than three times a week    Attends Religious Services: More than 4 times per year    Active Member of [employer] or Organizations: Yes    Attends Banker Meetings: More than 4 times per year    Marital Status: Widowed  Intimate Partner Violence: Not At Risk (10/11/2023)   Humiliation, Afraid, Rape, and Kick questionnaire    Fear of Current or Ex-Partner: No    Emotionally Abused: No    Physically Abused: No    Sexually Abused: No    Outpatient Medications Prior to Visit  Medication Sig Dispense Refill   Acetaminophen  (TYLENOL  8 HOUR ARTHRITIS PAIN PO) Take 1 tablet by mouth daily. Sometimes takes 1 extra per day     amLODipine  (NORVASC ) 10 MG tablet TAKE ONE TABLET BY MOUTH ONCE DAILY. 30 tablet 2   citalopram  (CELEXA ) 10 MG tablet Take 1 tablet (10 mg total) by mouth daily. 30 tablet 5   dorzolamide-timolol (COSOPT) 22.3-6.8 MG/ML ophthalmic solution 1 drop 2 (two) times daily.     Famotidine  (ACID CONTROLLER PO) Take 10 mg by mouth daily.     hydrochlorothiazide  (MICROZIDE ) 12.5 MG capsule TAKE 1 CAPSULE BY MOUTH ONCE DAILY. 30 capsule 0   isosorbide  mononitrate (IMDUR ) 30 MG 24 hr tablet Take one tablet by mouth once daily 30 tablet 3   latanoprost (XALATAN) 0.005 % ophthalmic solution 1 drop daily.     loratadine  (CLARITIN ) 10 MG tablet Take 1 tablet (10 mg total) by mouth daily as needed for allergies. 30 tablet 1    LYCOPENE PO Take by mouth.     Multiple Vitamin (MULTIVITAMIN WITH MINERALS) TABS tablet Take 1 tablet by mouth daily.     nitroGLYCERIN  (NITROSTAT ) 0.4 MG SL tablet DISSOLVE 1 TABLET SUBLINGUALLY AS NEEDED FOR CHEST PAIN, MAY REPEAT EVERY 5 MINUTES. AFTER 3 TABLETS CALL 911. 25 tablet 0   omeprazole  (PRILOSEC) 20  MG capsule Take 1 capsule (20 mg total) by mouth daily. 30 capsule 5   potassium chloride  SA (KLOR-CON  M) 20 MEQ tablet TAKE 1 TABLET BY MOUTH DAILY 30 tablet 0   pravastatin  (PRAVACHOL ) 40 MG tablet Take 1 tablet (40 mg total) by mouth daily. 100 tablet 3   triamterene -hydrochlorothiazide  (MAXZIDE -25) 37.5-25 MG tablet Take 1 tablet by mouth daily.     No facility-administered medications prior to visit.    No Known Allergies  Review of Systems  Constitutional:  Negative for chills and fever.  Eyes:  Negative for visual disturbance.  Respiratory:  Negative for chest tightness and shortness of breath.   Genitourinary:  Positive for frequency.  Skin:  Positive for rash.  Neurological:  Positive for headaches. Negative for dizziness.       Objective:     Physical Exam HENT:     Head: Normocephalic.     Mouth/Throat:     Mouth: Mucous membranes are moist.  Cardiovascular:     Rate and Rhythm: Normal rate.     Heart sounds: Normal heart sounds.  Pulmonary:     Effort: Pulmonary effort is normal.     Breath sounds: Normal breath sounds.  Abdominal:     Tenderness: There is no abdominal tenderness.  Skin:    Findings: Rash present.  Neurological:     Mental Status: She is alert.     BP (!) 141/65   Pulse 78   Resp 16   Ht 5\' 9"  (1.753 m)   Wt 149 lb (67.6 kg)   SpO2 91%   BMI 22.00 kg/m  Wt Readings from Last 3 Encounters:  03/09/24 149 lb (67.6 kg)  12/31/23 155 lb 6.4 oz (70.5 kg)  10/28/23 159 lb (72.1 kg)       Assessment & Plan:  UTI symptoms Assessment & Plan: Pending urinalysis  Preventative Measures Discussed for UTIs:  Avoid Prolonged  Urination: Do not hold urine for extended periods, as this can stretch the bladder and create an environment conducive to bacterial growth. Prompt Bladder Emptying: Empty the bladder as soon as you feel the urge to prevent bacterial buildup. Post-Intercourse Hygiene: Empty the bladder soon after intercourse to reduce the risk of infection. Shower Instead of Bath: Opt for showers rather than baths to minimize bacterial exposure. Proper Wiping Technique: Always wipe from front to back after urinating or bowel movements to prevent the transfer of bacteria from the anal region to the urethra or vagina. Hydration: Drink plenty of water  regularly to help flush bacteria from the urinary system. The patient verbalized understanding of the treatment plan and preventative measures. Follow-up will be scheduled based on the culture results or if symptoms persist or worsen.   Orders: -     Urinalysis  Dermatitis Assessment & Plan: Encouraged to start applying Kenalog 0.1% cream to the affected area twice daily (BID) as needed to relieve symptoms of itching. Advised not to apply for longer than 2 weeks to avoid skin thinning and other potential side effects.   Orders: -     Triamcinolone Acetonide; Apply 1 Application topically 2 (two) times daily.  Dispense: 60 g; Refill: 0  Other headache syndrome Assessment & Plan: The patient complains of a mild, intermittent headache. She denies any red flag symptoms, including nausea, vomiting, or severe intensity. She also denies this being the worst headache of her life. She reports taking 1-2 Tylenol  tablets as needed, which provides relief. The patient admits to minimal fluid intake. She  is encouraged to increase her fluid consumption and to continue taking Tylenol  as needed for headache relief.     Note: This chart has been completed using Engineer, civil (consulting) software, and while attempts have been made to ensure accuracy, certain words and phrases may not  be transcribed as intended.    Kathy Wahid, FNP

## 2024-03-15 ENCOUNTER — Other Ambulatory Visit: Payer: Self-pay | Admitting: Family Medicine

## 2024-03-29 ENCOUNTER — Other Ambulatory Visit: Payer: Self-pay | Admitting: Family Medicine

## 2024-04-03 NOTE — Progress Notes (Signed)
 Remote pacemaker transmission.

## 2024-04-03 NOTE — Addendum Note (Signed)
 Addended by: TAWNI DRILLING D on: 04/03/2024 10:09 AM   Modules accepted: Orders

## 2024-04-18 ENCOUNTER — Other Ambulatory Visit: Payer: Self-pay | Admitting: Family Medicine

## 2024-04-21 ENCOUNTER — Other Ambulatory Visit: Payer: Self-pay | Admitting: Family Medicine

## 2024-04-21 DIAGNOSIS — R399 Unspecified symptoms and signs involving the genitourinary system: Secondary | ICD-10-CM

## 2024-04-25 ENCOUNTER — Ambulatory Visit: Payer: Self-pay | Admitting: Family Medicine

## 2024-04-25 LAB — UA/M W/RFLX CULTURE, ROUTINE
Bilirubin, UA: NEGATIVE
Glucose, UA: NEGATIVE
Ketones, UA: NEGATIVE
Nitrite, UA: NEGATIVE
RBC, UA: NEGATIVE
Specific Gravity, UA: 1.019 (ref 1.005–1.030)
Urobilinogen, Ur: 1 mg/dL (ref 0.2–1.0)
pH, UA: 7 (ref 5.0–7.5)

## 2024-04-25 LAB — URINE CULTURE, REFLEX

## 2024-04-25 LAB — MICROSCOPIC EXAMINATION
Casts: NONE SEEN /LPF
RBC, Urine: NONE SEEN /HPF (ref 0–2)

## 2024-04-25 MED ORDER — CEPHALEXIN 500 MG PO CAPS: 500.0000 mg | ORAL_CAPSULE | Freq: Two times a day (BID) | ORAL | 0 refills | Status: DC

## 2024-04-26 ENCOUNTER — Telehealth: Payer: Self-pay

## 2024-04-26 NOTE — Telephone Encounter (Signed)
 Copied from CRM #[id number]. Topic: Clinical - Lab/Test Results >> Apr 26, 2024  3:24 PM Emylou G wrote: Reason for CRM: Adv daughter of lab results and script

## 2024-05-02 ENCOUNTER — Ambulatory Visit (INDEPENDENT_AMBULATORY_CARE_PROVIDER_SITE_OTHER): Admitting: Family Medicine

## 2024-05-02 ENCOUNTER — Encounter: Payer: Self-pay | Admitting: Family Medicine

## 2024-05-02 VITALS — BP 114/62 | HR 60 | Resp 16 | Ht 69.0 in

## 2024-05-02 DIAGNOSIS — N390 Urinary tract infection, site not specified: Secondary | ICD-10-CM | POA: Diagnosis not present

## 2024-05-02 DIAGNOSIS — R413 Other amnesia: Secondary | ICD-10-CM | POA: Diagnosis not present

## 2024-05-02 DIAGNOSIS — R2681 Unsteadiness on feet: Secondary | ICD-10-CM

## 2024-05-02 DIAGNOSIS — M25611 Stiffness of right shoulder, not elsewhere classified: Secondary | ICD-10-CM | POA: Diagnosis not present

## 2024-05-02 DIAGNOSIS — I10 Essential (primary) hypertension: Secondary | ICD-10-CM

## 2024-05-02 DIAGNOSIS — F411 Generalized anxiety disorder: Secondary | ICD-10-CM

## 2024-05-02 DIAGNOSIS — M25612 Stiffness of left shoulder, not elsewhere classified: Secondary | ICD-10-CM

## 2024-05-02 DIAGNOSIS — K219 Gastro-esophageal reflux disease without esophagitis: Secondary | ICD-10-CM | POA: Diagnosis not present

## 2024-05-02 NOTE — Patient Instructions (Signed)
 Annual exam November when due   Please bring in urine for testing when  you feel you may have an infection nurse pls provide cup and ordr for CCUA and reflex c/s for when symptomatic   Ensure good water  intake  Tdap and covid vaccines at the pharmacy  No falls please  Keep good apetite  Thanks for choosing [facility], we consider it a privelige to serve you.

## 2024-05-03 ENCOUNTER — Encounter: Payer: Self-pay | Admitting: Family Medicine

## 2024-05-03 DIAGNOSIS — N390 Urinary tract infection, site not specified: Secondary | ICD-10-CM | POA: Insufficient documentation

## 2024-05-03 NOTE — Progress Notes (Signed)
 Candace Cervantes     MRN: [id number]      DOB: 26-Jan-1925  Chief Complaint  Patient presents with   Hypertension    4 month follow up    Shoulder Pain    Pt complains of bilateral shoulder pain on and off for a couple weeks, believes it may be how she is sleeping    Leg Swelling    Complains of right leg swelling for couple weeks as well, not as bad today    Urinary Tract Infection    They are asking for refills of a abx since frequent uti's due to poor wiping     HPI Candace Cervantes is here for follow up and re-evaluation of chronic medical conditions, medication management and review of any available recent lab and radiology data.  Preventive health is updated, specifically  Cancer screening and Immunization.   Questions or concerns regarding consultations or procedures which the PT has had in the interim are  addressed. The PT denies any adverse reactions to current medications since the last visit.  Concerns as above ROS Denies recent fever or chills. Denies sinus pressure, nasal congestion, ear pain or sore throat. Denies chest congestion, productive cough or wheezing. Denies chest pains, palpitations PND or orthopnea Denies abdominal pain, nausea, vomiting,diarrhea or constipation.  Reports good appetite  Chronic  joint pain,  and limitation in mobility.No falls or near falls  Denies uncontrolled or frequent headaches, . Denies depression,  uncontrolled anxiety or insomnia. Denies skin break down or rash.   PE  BP 114/62   Pulse 60   Resp 16   Ht 5' 9 (1.753 m)   SpO2 94%   BMI 22.00 kg/m   Patient alert  and in no cardiopulmonary distress.  HEENT: No facial asymmetry, EOMI,     Neck decreased ROM .  Chest: Clear to auscultation bilaterally.  CVS: S1, S2 no murmurs, no S3.Regular rate.  ABD: Soft non tender.   Ext: No edema  MS: decreased  though adequate ROM spine, shoulders, hips and knees.  Skin: Intact, no ulcerations or rash noted.  Psych: Good eye  contact, normal affect. Memory impaired not anxious or depressed appearing.  CNS: CN 2-12 intact, power,  normal throughout.no focal deficits noted.   Assessment & Plan  Essential hypertension Controlled, no change in medication DASH diet and commitment to daily physical activity for a minimum of 30 minutes discussed and encouraged, as a part of hypertension management. The importance of attaining a healthy weight is also discussed.     05/02/2024    4:01 PM 03/09/2024    2:15 PM 12/31/2023    4:00 PM 10/28/2023    1:48 PM 10/11/2023   12:39 PM 10/07/2023    1:53 PM 10/07/2023    1:14 PM  BP/Weight  Systolic BP 114 141 134  -- 128 142  Diastolic BP 62 65 72  -- 72 72  Wt. (Lbs)  149 155.4 159 159    BMI  22 kg/m2 22.95 kg/m2 23.48 kg/m2 23.48 kg/m2         Recurrent UTI (urinary tract infection) Specimen to be submitted for testing when symptomatic Encouraged to increase water  intake and wipe from front to back  Unsteady gait Home safety and fall risk reduction discussed  Shoulder joint stiffness, bilateral Encouraged and demonstrated with pt participation ROM exercises , to be done twice daily  Memory impairment of gradual onset Continue low dose aricept  which she is able to trolerate  GERD Controlled, no change in medication   GAD (generalized anxiety disorder) Controlled, no change in medication

## 2024-05-03 NOTE — Assessment & Plan Note (Signed)
 Controlled, no change in medication

## 2024-05-03 NOTE — Assessment & Plan Note (Signed)
 Encouraged and demonstrated with pt participation ROM exercises , to be done twice daily

## 2024-05-03 NOTE — Assessment & Plan Note (Signed)
 Controlled, no change in medication DASH diet and commitment to daily physical activity for a minimum of 30 minutes discussed and encouraged, as a part of hypertension management. The importance of attaining a healthy weight is also discussed.     05/02/2024    4:01 PM 03/09/2024    2:15 PM 12/31/2023    4:00 PM 10/28/2023    1:48 PM 10/11/2023   12:39 PM 10/07/2023    1:53 PM 10/07/2023    1:14 PM  BP/Weight  Systolic BP 114 141 134  -- 128 142  Diastolic BP 62 65 72  -- 72 72  Wt. (Lbs)  149 155.4 159 159    BMI  22 kg/m2 22.95 kg/m2 23.48 kg/m2 23.48 kg/m2

## 2024-05-03 NOTE — Assessment & Plan Note (Signed)
 Continue low dose aricept  which she is able to trolerate

## 2024-05-03 NOTE — Assessment & Plan Note (Signed)
 Specimen to be submitted for testing when symptomatic Encouraged to increase water  intake and wipe from front to back

## 2024-05-03 NOTE — Assessment & Plan Note (Signed)
Home safety and fall risk reduction discussed 

## 2024-05-08 ENCOUNTER — Ambulatory Visit: Admitting: Podiatry

## 2024-05-08 ENCOUNTER — Encounter: Payer: Self-pay | Admitting: Podiatry

## 2024-05-08 DIAGNOSIS — Q828 Other specified congenital malformations of skin: Secondary | ICD-10-CM

## 2024-05-08 DIAGNOSIS — M79675 Pain in left toe(s): Secondary | ICD-10-CM

## 2024-05-08 DIAGNOSIS — B351 Tinea unguium: Secondary | ICD-10-CM

## 2024-05-08 DIAGNOSIS — I739 Peripheral vascular disease, unspecified: Secondary | ICD-10-CM

## 2024-05-08 DIAGNOSIS — M79674 Pain in right toe(s): Secondary | ICD-10-CM

## 2024-05-08 NOTE — Progress Notes (Signed)
  Subjective:  Patient ID: Candace Cervantes, female    DOB: April 22, 1925,  MRN: [id number]  Candace Cervantes presents to clinic today for painful porokeratotic lesion(s) bilateral 5th toes and painful mycotic toenails that limit ambulation. Painful toenails interfere with ambulation. Aggravating factors include wearing enclosed shoe gear. Pain is relieved with periodic professional debridement. Painful porokeratotic lesions are aggravated when weightbearing with and without shoegear. Pain is relieved with periodic professional debridement. She is accompanied by her daughter on today's visit. Chief Complaint  Patient presents with   rfc    Pt is here to have nails trimmed PCP Antonetta Rollene BRAVO, MD No changes since last visit      New problem(s): None.   PCP is Antonetta Rollene BRAVO, MD.LOV 05/02/2024.  No Known Allergies  Review of Systems: Negative except as noted in the HPI.  Objective:  There were no vitals filed for this visit. Candace Cervantes is a pleasant 89 y.o. female in NAD. AAO x 3.  Vascular Examination: Capillary refill time <3 seconds b/l LE. Faintly palpable DP pulses b/l. Diminished PT pulses b/l.  Digital hair absent b/l. No pedal edema b/l. Skin temperature gradient WNL b/l. No varicosities b/l. SABRA  Dermatological Examination: Pedal skin with normal turgor, texture and tone b/l. No open wounds. No interdigital macerations b/l. Toenails 1-5 b/l thickened, discolored, dystrophic with subungual debris. There is pain on palpation to dorsal aspect of nailplates.   Incurvated nailplate right great toe lateral border(s) with tenderness to palpation. No erythema, no edema, no drainage noted.   Porokeratotic lesion(s) dorsal PIPJ bilateral 5th toes.  No erythema, no edema, no drainage, no fluctuance..  Neurological Examination: Protective sensation intact with 10 gram monofilament b/l LE. Vibratory sensation intact b/l LE.   Musculoskeletal Examination: Normal muscle strength 5/5  to all lower extremity muscle groups bilaterally. No pain, crepitus or joint limitation noted with ROM b/l LE. No gross bony pedal deformities b/l. Patient using transport chair on today's visit.  Assessment/Plan: 1. Pain due to onychomycosis of toenails of both feet   2. Porokeratosis   3. PAD (peripheral artery disease) (HCC)   Patient was evaluated and treated. All patient's and/or POA's questions/concerns addressed on today's visit. Mycotic toenails 1-5 debrided in length and girth without incident. Porokeratotic lesion(s) dorsal PIPJ of bilateral 5th toes pared with sharp debridement without incident. Continue soft, supportive shoe gear daily. Report any pedal injuries to medical professional. Call office if there are any questions/concerns.  Return in about 3 months (around 08/08/2024).  Delon LITTIE Merlin, DPM      [place] LOCATION: [address], [place] [address]                   Office [phone]   [facility] LOCATION: [address] [place], [place] [address] Office [phone]

## 2024-05-15 ENCOUNTER — Ambulatory Visit: Payer: Self-pay | Admitting: Internal Medicine

## 2024-05-15 ENCOUNTER — Ambulatory Visit: Payer: Medicare Other

## 2024-05-15 DIAGNOSIS — I495 Sick sinus syndrome: Secondary | ICD-10-CM

## 2024-05-15 LAB — CUP PACEART REMOTE DEVICE CHECK
Battery Remaining Longevity: 70 mo
Battery Remaining Percentage: 65 %
Battery Voltage: 3.01 V
Brady Statistic AP VP Percent: 1 %
Brady Statistic AP VS Percent: 60 %
Brady Statistic AS VP Percent: 1 %
Brady Statistic AS VS Percent: 39 %
Brady Statistic RA Percent Paced: 58 %
Brady Statistic RV Percent Paced: 1 %
Date Time Interrogation Session: 20250811020022
Implantable Lead Connection Status: 753985
Implantable Lead Connection Status: 753985
Implantable Lead Implant Date: 20021126
Implantable Lead Implant Date: 20021126
Implantable Lead Location: 753859
Implantable Lead Location: 753860
Implantable Pulse Generator Implant Date: 20210412
Lead Channel Impedance Value: 410 Ohm
Lead Channel Impedance Value: 490 Ohm
Lead Channel Pacing Threshold Amplitude: 0.5 V
Lead Channel Pacing Threshold Amplitude: 1 V
Lead Channel Pacing Threshold Pulse Width: 0.5 ms
Lead Channel Pacing Threshold Pulse Width: 0.9 ms
Lead Channel Sensing Intrinsic Amplitude: 1.3 mV
Lead Channel Sensing Intrinsic Amplitude: 5.1 mV
Lead Channel Setting Pacing Amplitude: 2 V
Lead Channel Setting Pacing Amplitude: 2.5 V
Lead Channel Setting Pacing Pulse Width: 0.9 ms
Lead Channel Setting Sensing Sensitivity: 2 mV
Pulse Gen Model: 2272
Pulse Gen Serial Number: 3814236

## 2024-05-17 DIAGNOSIS — H02831 Dermatochalasis of right upper eyelid: Secondary | ICD-10-CM | POA: Diagnosis not present

## 2024-05-17 DIAGNOSIS — H401134 Primary open-angle glaucoma, bilateral, indeterminate stage: Secondary | ICD-10-CM | POA: Diagnosis not present

## 2024-05-17 DIAGNOSIS — H02834 Dermatochalasis of left upper eyelid: Secondary | ICD-10-CM | POA: Diagnosis not present

## 2024-05-17 DIAGNOSIS — Z961 Presence of intraocular lens: Secondary | ICD-10-CM | POA: Diagnosis not present

## 2024-05-17 DIAGNOSIS — H04123 Dry eye syndrome of bilateral lacrimal glands: Secondary | ICD-10-CM | POA: Diagnosis not present

## 2024-05-29 ENCOUNTER — Ambulatory Visit (INDEPENDENT_AMBULATORY_CARE_PROVIDER_SITE_OTHER): Admitting: Podiatry

## 2024-05-29 DIAGNOSIS — Z91198 Patient's noncompliance with other medical treatment and regimen for other reason: Secondary | ICD-10-CM

## 2024-05-29 NOTE — Progress Notes (Signed)
 1. Erroneous encounter - disregard   2. Failure to attend appointment with reason given    Error in scheduling at office. Patient seen 05/08/2024.

## 2024-06-17 ENCOUNTER — Other Ambulatory Visit: Payer: Self-pay | Admitting: Family Medicine

## 2024-06-26 ENCOUNTER — Encounter: Payer: Self-pay | Admitting: Podiatry

## 2024-06-26 ENCOUNTER — Ambulatory Visit (INDEPENDENT_AMBULATORY_CARE_PROVIDER_SITE_OTHER): Admitting: Podiatry

## 2024-06-26 DIAGNOSIS — L600 Ingrowing nail: Secondary | ICD-10-CM

## 2024-06-26 DIAGNOSIS — M79675 Pain in left toe(s): Secondary | ICD-10-CM

## 2024-06-26 DIAGNOSIS — M79674 Pain in right toe(s): Secondary | ICD-10-CM | POA: Diagnosis not present

## 2024-06-26 NOTE — Progress Notes (Unsigned)
  Subjective:  Patient ID: Candace Cervantes, female    DOB: Jun 07, 1925,  MRN: [id number]  Candace Cervantes presents to clinic today for {jgcomplaint:23593}  Chief Complaint  Patient presents with   RFC    RFC Non diabetic toenail trim.LOV with PCP 04/2024. Left great toenail border painful.   New problem(s): None. {jgcomplaint:23593}  PCP is Antonetta Rollene BRAVO, MD.  No Known Allergies  Review of Systems: Negative except as noted in the HPI.  Objective: No changes noted in today's physical examination. There were no vitals filed for this visit. FRANCY MCILVAINE is a pleasant 89 y.o. female {jgbodyhabitus:24098} AAO x 3.  Assessment/Plan: No diagnosis found.  No orders of the defined types were placed in this encounter.   None {Jgplan:23602::-Patient/POA to call should there be question/concern in the interim.}   No follow-ups on file.  Delon LITTIE Merlin, DPM      [place] LOCATION: [address], [place] [address]                   Office [phone]   [facility] LOCATION: [address] [place], [place] [address] Office [phone]

## 2024-06-26 NOTE — Patient Instructions (Signed)
EPSOM SALT FOOT SOAK INSTRUCTIONS  Shopping List:  A. Plain epsom salt (not scented) B. Neosporin Cream C. 1-inch fabric band-aids   Place 1/4 cup of epsom salts in 2 quarts of warm tap water. IF YOU ARE DIABETIC, OR HAVE NEUROPATHY, CHECK THE TEMPERATURE OF THE WATER WITH YOUR ELBOW.   Submerge your foot/feet in the solution and soak for 10-15 minutes.      3.  Next, remove your foot/feet from solution, blot dry the affected area.    4.  Apply light amount of antibiotic cream/ointment and cover with fabric band-aid .  5.  This soak should be done once a day for 7 days.   6.  Monitor for any signs/symptoms of infection such as redness, swelling, odor, drainage, increased pain, or non-healing of digit.   7.  Please do not hesitate to call the office and speak to a Nurse or Doctor if you have questions.   8.  If you experience fever, chills, nightsweats, nausea or vomiting with worsening of digit/foot, please go to the emergency room.

## 2024-06-28 ENCOUNTER — Encounter: Payer: Self-pay | Admitting: Podiatry

## 2024-06-28 DIAGNOSIS — H02831 Dermatochalasis of right upper eyelid: Secondary | ICD-10-CM | POA: Diagnosis not present

## 2024-06-28 DIAGNOSIS — H401134 Primary open-angle glaucoma, bilateral, indeterminate stage: Secondary | ICD-10-CM | POA: Diagnosis not present

## 2024-06-28 DIAGNOSIS — H04123 Dry eye syndrome of bilateral lacrimal glands: Secondary | ICD-10-CM | POA: Diagnosis not present

## 2024-06-28 DIAGNOSIS — H02834 Dermatochalasis of left upper eyelid: Secondary | ICD-10-CM | POA: Diagnosis not present

## 2024-06-28 DIAGNOSIS — Z961 Presence of intraocular lens: Secondary | ICD-10-CM | POA: Diagnosis not present

## 2024-06-30 NOTE — Progress Notes (Signed)
 Remote PPM Transmission

## 2024-07-15 ENCOUNTER — Other Ambulatory Visit: Payer: Self-pay | Admitting: Family Medicine

## 2024-07-20 ENCOUNTER — Ambulatory Visit: Admitting: Podiatry

## 2024-07-20 DIAGNOSIS — M79675 Pain in left toe(s): Secondary | ICD-10-CM

## 2024-07-20 DIAGNOSIS — I739 Peripheral vascular disease, unspecified: Secondary | ICD-10-CM

## 2024-07-20 DIAGNOSIS — Q828 Other specified congenital malformations of skin: Secondary | ICD-10-CM

## 2024-07-24 ENCOUNTER — Encounter: Payer: Self-pay | Admitting: Podiatry

## 2024-07-24 NOTE — Progress Notes (Signed)
 Subjective:  Patient ID: Candace Cervantes, female    DOB: Oct 06, 1924,  MRN: [id number]  Candace Cervantes presents to clinic today for at risk foot care. Patient has h/o PAD and painful porokeratotic lesion(s) bilateral 5th toes and painful mycotic toenails that limit ambulation. Painful toenails interfere with ambulation. Aggravating factors include wearing enclosed shoe gear. Pain is relieved with periodic professional debridement. Painful porokeratotic lesions are aggravated when weightbearing with and without shoegear. Pain is relieved with periodic professional debridement.  That toe is sore. I don't know why. She is accompanied by her daughter on today's visit. Daughter states she was able to remove one pair of leather dress shoes from Mom's closet and she's working on removing another pair. Mom walks for exercise daily. Chief Complaint  Patient presents with   Toe Pain    RFC. Dr. Antonetta is her PCP. Her last visit was in July 2025. Denies being diabetic.    New problem(s): None.   PCP is Candace Rollene BRAVO, MD.  No Known Allergies  Review of Systems: Negative except as noted in the HPI.  Objective: There were no vitals filed for this visit. Candace Cervantes is a pleasant 89 y.o. female WD, WN in NAD. AAO x 3.  Vascular Examination: Capillary refill time <3 seconds b/l LE. Faintly palpable pedal pulses b/l LE. Digital hair present b/l. No pedal edema b/l. Skin temperature gradient WNL b/l. No varicosities b/l. Candace Cervantes  Dermatological Examination: Pedal skin with normal turgor, texture and tone b/l. No open wounds. No interdigital macerations b/l. Toenails 1-5 b/l thickened, discolored, dystrophic with subungual debris. There is pain on palpation to dorsal aspect of nailplates.   Incurvated nailplate right great toe lateral border(s) with tenderness to palpation. No erythema, no edema, no drainage noted.   Porokeratotic lesion(s) dorsal PIPJ bilateral 5th toes.  No erythema, no edema, no  drainage, no fluctuance..  Neurological Examination: Protective sensation intact with 10 gram monofilament b/l LE. Vibratory sensation intact b/l LE.   Musculoskeletal Examination: Normal muscle strength 5/5 to all lower extremity muscle groups bilaterally. No pain, crepitus or joint limitation noted with ROM b/l LE. HAV with bunion bilaterally and hammertoes 2-5 b/l. Patient using transport chair on today's visit. Wearing leather flats.  Assessment/Plan: 1. Pain in toe of left foot   2. Porokeratosis   3. PAD (peripheral artery disease)   -Consent given for treatment. Patient examined. All patient's and/or POA's questions/concerns addressed on today's visit. Toenails left hallux and 2-5 b/l debrided in length and girth without incident.  -No invasive procedure(s) performed. Offending nail border debrided and curretaged right great toe utilizing sterile nail nipper and currette. Border(s) cleansed with alcohol and triple antibiotic ointment applied. Patient/POA/Caregiver/Facility instructed to apply triple antibiotic ointment  to R hallux once daily for 7 days. Call office if there are any concerns. -Patient/POA to call should there be question/concern in the interim. -Porokeratotic lesion(s) dorsal PIPJ of bilateral 5th toes pared and enucleated with sharp debridement without incident. Continue soft, supportive shoe gear daily. Report any pedal injuries to medical professional. Call office if there are any questions/concerns. -Daughter has purchased [employer]chers with stretchable uppers, yet [name] continues to choose [employer]. Daughter is working on removing ill fitting shoes from [employer].   Return in about 9 weeks (around 09/21/2024).  Candace Cervantes, DPM      [place] LOCATION: [address]  [place], [place] [address]                   Office [phone]   [facility] LOCATION: [address] [place], [place]  [address] Office [phone]

## 2024-08-14 ENCOUNTER — Ambulatory Visit (INDEPENDENT_AMBULATORY_CARE_PROVIDER_SITE_OTHER): Payer: Medicare Other

## 2024-08-14 DIAGNOSIS — I495 Sick sinus syndrome: Secondary | ICD-10-CM

## 2024-08-14 LAB — CUP PACEART REMOTE DEVICE CHECK
Battery Remaining Longevity: 69 mo
Battery Remaining Percentage: 62 %
Battery Voltage: 2.99 V
Brady Statistic AP VP Percent: 1 %
Brady Statistic AP VS Percent: 57 %
Brady Statistic AS VP Percent: 1 %
Brady Statistic AS VS Percent: 42 %
Brady Statistic RA Percent Paced: 56 %
Brady Statistic RV Percent Paced: 1 %
Date Time Interrogation Session: 20251110045519
Implantable Lead Connection Status: 753985
Implantable Lead Connection Status: 753985
Implantable Lead Implant Date: 20021126
Implantable Lead Implant Date: 20021126
Implantable Lead Location: 753859
Implantable Lead Location: 753860
Implantable Pulse Generator Implant Date: 20210412
Lead Channel Impedance Value: 410 Ohm
Lead Channel Impedance Value: 510 Ohm
Lead Channel Pacing Threshold Amplitude: 0.5 V
Lead Channel Pacing Threshold Amplitude: 1 V
Lead Channel Pacing Threshold Pulse Width: 0.5 ms
Lead Channel Pacing Threshold Pulse Width: 0.9 ms
Lead Channel Sensing Intrinsic Amplitude: 1.2 mV
Lead Channel Sensing Intrinsic Amplitude: 6.3 mV
Lead Channel Setting Pacing Amplitude: 2 V
Lead Channel Setting Pacing Amplitude: 2.5 V
Lead Channel Setting Pacing Pulse Width: 0.9 ms
Lead Channel Setting Sensing Sensitivity: 2 mV
Pulse Gen Model: 2272
Pulse Gen Serial Number: 3814236

## 2024-08-15 ENCOUNTER — Other Ambulatory Visit: Payer: Self-pay | Admitting: Family Medicine

## 2024-08-17 NOTE — Progress Notes (Signed)
 Remote PPM Transmission

## 2024-08-20 ENCOUNTER — Emergency Department (HOSPITAL_COMMUNITY)

## 2024-08-20 ENCOUNTER — Emergency Department (HOSPITAL_COMMUNITY)
Admission: EM | Admit: 2024-08-20 | Discharge: 2024-08-20 | Disposition: A | Discharge status: Home / Self Care | Attending: Emergency Medicine | Admitting: Emergency Medicine

## 2024-08-20 ENCOUNTER — Ambulatory Visit: Payer: Self-pay | Admitting: Internal Medicine

## 2024-08-20 DIAGNOSIS — Z79899 Other long term (current) drug therapy: Secondary | ICD-10-CM | POA: Insufficient documentation

## 2024-08-20 DIAGNOSIS — R519 Headache, unspecified: Secondary | ICD-10-CM | POA: Insufficient documentation

## 2024-08-20 DIAGNOSIS — I10 Essential (primary) hypertension: Secondary | ICD-10-CM | POA: Insufficient documentation

## 2024-08-20 DIAGNOSIS — Z950 Presence of cardiac pacemaker: Secondary | ICD-10-CM | POA: Diagnosis not present

## 2024-08-20 DIAGNOSIS — F0390 Unspecified dementia without behavioral disturbance: Secondary | ICD-10-CM | POA: Insufficient documentation

## 2024-08-20 DIAGNOSIS — I2510 Atherosclerotic heart disease of native coronary artery without angina pectoris: Secondary | ICD-10-CM | POA: Insufficient documentation

## 2024-08-20 DIAGNOSIS — R251 Tremor, unspecified: Secondary | ICD-10-CM | POA: Insufficient documentation

## 2024-08-20 LAB — URINALYSIS, ROUTINE W REFLEX MICROSCOPIC
Bilirubin Urine: NEGATIVE
Glucose, UA: NEGATIVE mg/dL
Hgb urine dipstick: NEGATIVE
Ketones, ur: NEGATIVE mg/dL
Leukocytes,Ua: NEGATIVE
Nitrite: NEGATIVE
Protein, ur: NEGATIVE mg/dL
Specific Gravity, Urine: 1.01 (ref 1.005–1.030)
pH: 7 (ref 5.0–8.0)

## 2024-08-20 LAB — CBC WITH DIFFERENTIAL/PLATELET
Abs Immature Granulocytes: 0.01 K/uL (ref 0.00–0.07)
Basophils Absolute: 0 K/uL (ref 0.0–0.1)
Basophils Relative: 0 %
Eosinophils Absolute: 0.1 K/uL (ref 0.0–0.5)
Eosinophils Relative: 1 %
HCT: 34.3 % — ABNORMAL LOW (ref 36.0–46.0)
Hemoglobin: 11.4 g/dL — ABNORMAL LOW (ref 12.0–15.0)
Immature Granulocytes: 0 %
Lymphocytes Relative: 46 %
Lymphs Abs: 2.4 K/uL (ref 0.7–4.0)
MCH: 32.6 pg (ref 26.0–34.0)
MCHC: 33.2 g/dL (ref 30.0–36.0)
MCV: 98 fL (ref 80.0–100.0)
Monocytes Absolute: 0.5 K/uL (ref 0.1–1.0)
Monocytes Relative: 9 %
Neutro Abs: 2.3 K/uL (ref 1.7–7.7)
Neutrophils Relative %: 44 %
Platelets: 155 K/uL (ref 150–400)
RBC: 3.5 MIL/uL — ABNORMAL LOW (ref 3.87–5.11)
RDW: 14.1 % (ref 11.5–15.5)
WBC: 5.3 K/uL (ref 4.0–10.5)
nRBC: 0 % (ref 0.0–0.2)

## 2024-08-20 LAB — COMPREHENSIVE METABOLIC PANEL WITH GFR
ALT: 32 U/L (ref 0–44)
AST: 32 U/L (ref 15–41)
Albumin: 4.1 g/dL (ref 3.5–5.0)
Alkaline Phosphatase: 62 U/L (ref 38–126)
Anion gap: 7 (ref 5–15)
BUN: 19 mg/dL (ref 8–23)
CO2: 29 mmol/L (ref 22–32)
Calcium: 9.5 mg/dL (ref 8.9–10.3)
Chloride: 103 mmol/L (ref 98–111)
Creatinine, Ser: 0.9 mg/dL (ref 0.44–1.00)
GFR, Estimated: 58 mL/min — ABNORMAL LOW (ref >60)
Glucose, Bld: 99 mg/dL (ref 70–99)
Potassium: 3.8 mmol/L (ref 3.5–5.1)
Sodium: 139 mmol/L (ref 135–145)
Total Bilirubin: 0.4 mg/dL (ref 0.0–1.2)
Total Protein: 7 g/dL (ref 6.5–8.1)

## 2024-08-20 LAB — CBG MONITORING, ED: Glucose-Capillary: 96 mg/dL (ref 70–99)

## 2024-08-20 NOTE — ED Provider Notes (Signed)
 Patient's care assumed at 7 PM.  Patient is waiting for results of UA and an EKG.  Patient had an episode tonight where she had a pain in the right side of her head.  Family reported she had some shaking.  Patient did not lose consciousness she was awake and responsive.  Family was concerned that she was more confused than normal. EKG shows no acute changes Patient advised to follow-up with primary care physician for recheck.    Aerion Bagdasarian K, PA-C 08/20/24 2115    Towana Ozell BROCKS, MD 08/21/24 1014

## 2024-08-20 NOTE — ED Notes (Signed)
 Attempted to get UA- urine missed hat  Will try again

## 2024-08-20 NOTE — ED Notes (Signed)
 Pt/family received d/c paperwork at this time. After going over the paperwork any questions, comments, or concerns were answered to the best of this nurse's knowledge. The pt/family verbally acknowledged the teachings/instructions.   D/c paperwork reviewed with daughter and sister.

## 2024-08-20 NOTE — ED Triage Notes (Signed)
 Pt comes in for h/a. Pt was watching TV when she suddenly felt a sharp pain in the right side of her head. Per family, after the pain started to shake and was responsive. Pt has dementia at baseline but family believes she is more confused than normal.    During triage, pt denies any pain.

## 2024-08-20 NOTE — Discharge Instructions (Signed)
 Return if any problems.

## 2024-08-20 NOTE — ED Provider Notes (Signed)
 [place] EMERGENCY DEPARTMENT AT [facility] Provider Note   CSN: [id number] Arrival date & time: 08/20/24  1410     Patient presents with: Headache   Candace Cervantes is a 89 y.o. female.  She has history of chronic headaches, CAD, has pacemaker, history of hypertension, high cholesterol, GERD.  She presents to the ER today with her daughter for an episode of generalized shaking.  Patient had complained of a sharp pain on the right side of her head before this.  She was responsive while this happened, she then started complaining of generalized pain all over her body, currently is back to her baseline.  No current pain.  She did not lose control of her bowels or bladder, no biting of the lips or tongue.  She has never done this in the past, no history of seizures.  No new medications.  Family states she did fall last week but did not injure herself and did not strike her head.     Headache      Prior to Admission medications   Medication Sig Start Date End Date Taking? Authorizing Provider  Acetaminophen  (TYLENOL  8 HOUR ARTHRITIS PAIN PO) Take 1 tablet by mouth daily. Sometimes takes 1 extra per day    [provider]  amLODipine  (NORVASC ) 10 MG tablet TAKE ONE TABLET BY MOUTH ONCE DAILY. 07/17/24   Antonetta Rollene BRAVO, MD  cephALEXin  (KEFLEX ) 500 MG capsule Take 1 capsule (500 mg total) by mouth 2 (two) times daily. 04/25/24   Antonetta Rollene BRAVO, MD  citalopram  (CELEXA ) 10 MG tablet Take 1 tablet (10 mg total) by mouth daily. 07/17/24   Antonetta Rollene BRAVO, MD  donepezil  (ARICEPT ) 5 MG tablet Take 1 tablet (5 mg total) by mouth at bedtime. 06/19/24   Antonetta Rollene BRAVO, MD  dorzolamide-timolol (COSOPT) 22.3-6.8 MG/ML ophthalmic solution 1 drop 2 (two) times daily. 02/07/20   [provider]  Famotidine  (ACID CONTROLLER PO) Take 10 mg by mouth daily.    [provider]  hydrochlorothiazide  (MICROZIDE ) 12.5 MG capsule TAKE 1 CAPSULE BY MOUTH ONCE  DAILY. 06/19/24   Antonetta Rollene BRAVO, MD  isosorbide  mononitrate (IMDUR ) 30 MG 24 hr tablet Take one tablet by mouth once daily 08/15/24   Simpson, Margaret E, MD  latanoprost (XALATAN) 0.005 % ophthalmic solution 1 drop daily. 02/04/20   [provider]  loratadine  (CLARITIN ) 10 MG tablet Take 1 tablet (10 mg total) by mouth daily as needed for allergies. 10/09/22   Antonetta Rollene BRAVO, MD  LYCOPENE PO Take by mouth. 08/05/15   [provider]  Multiple Vitamin (MULTIVITAMIN WITH MINERALS) TABS tablet Take 1 tablet by mouth daily.    [provider]  nitroGLYCERIN  (NITROSTAT ) 0.4 MG SL tablet DISSOLVE 1 TABLET SUBLINGUALLY AS NEEDED FOR CHEST PAIN, MAY REPEAT EVERY 5 MINUTES. AFTER 3 TABLETS CALL 911. 04/10/21   Waddell Danelle ORN, MD  omeprazole  (PRILOSEC) 20 MG capsule Take 1 capsule (20 mg total) by mouth daily. 07/17/24   Antonetta Rollene BRAVO, MD  potassium chloride  SA (KLOR-CON  M) 20 MEQ tablet TAKE 1 TABLET BY MOUTH DAILY 06/19/24   Antonetta Rollene BRAVO, MD  pravastatin  (PRAVACHOL ) 40 MG tablet Take 1 tablet (40 mg total) by mouth daily. 03/15/24   Antonetta Rollene BRAVO, MD  triamterene -hydrochlorothiazide  (MAXZIDE -25) 37.5-25 MG tablet Take 1 tablet by mouth daily. 07/08/15   [provider]    Allergies: Patient has no known allergies.    Review of Systems  Neurological:  Positive for headaches.    Updated Vital Signs BP (!) 157/74   Pulse 60   Temp 98.6 F (37 C) (Oral)   Resp 18   SpO2 92%   Physical Exam Vitals and nursing note reviewed.  Constitutional:      General: She is not in acute distress.    Appearance: She is well-developed.  HENT:     Head: Normocephalic and atraumatic.  Eyes:     Extraocular Movements: Extraocular movements intact.     Conjunctiva/sclera: Conjunctivae normal.     Pupils: Pupils are equal, round, and reactive to light.  Cardiovascular:     Rate and Rhythm: Normal rate and regular rhythm.     Heart sounds: No murmur  heard. Pulmonary:     Effort: Pulmonary effort is normal. No respiratory distress.     Breath sounds: Normal breath sounds.  Abdominal:     Palpations: Abdomen is soft.     Tenderness: There is no abdominal tenderness.  Musculoskeletal:        General: No swelling.     Cervical back: Neck supple.  Skin:    General: Skin is warm and dry.     Capillary Refill: Capillary refill takes less than 2 seconds.  Neurological:     Mental Status: She is alert. Mental status is at baseline.     GCS: GCS eye subscore is 4. GCS verbal subscore is 5. GCS motor subscore is 6.     Cranial Nerves: No cranial nerve deficit, dysarthria or facial asymmetry.     Sensory: No sensory deficit.     Motor: No weakness.  Psychiatric:        Mood and Affect: Mood normal.     (all labs ordered are listed, but only abnormal results are displayed) Labs Reviewed  CBC WITH DIFFERENTIAL/PLATELET  COMPREHENSIVE METABOLIC PANEL WITH GFR  URINALYSIS, ROUTINE W REFLEX MICROSCOPIC    EKG: None  Radiology: No results found.   Procedures   Medications Ordered in the ED - No data to display                                  Medical Decision Making Includes but not limited to intracranial hemorrhage, seizure, migraine, electrolyte derangement, UTI, other  ED course: Patient presented for episode of generalized shaking where she was still alert and oriented and talking with associate with a headache.  She had no focal deficits on exam.  Her history is limited due to her dementia, though her daughters are at bedside today and help with history because they witnessed this event.  They feel like she is back to her normal she has not had any episodes here.  She denied any chest pain or shortness of breath, she did not have syncope.  This never happened before.  CT of her head did not show any acute findings, CBC and a reassuring, awaiting urinalysis and EKG time of signout to [employer], PA-C.  Family feels  comfortable taking her home pending UA and EKG.  Amount and/or Complexity of Data Reviewed Labs: ordered. Radiology: ordered.        Final diagnoses:  None    ED Discharge Orders     None          Suellen Sherran DELENA DEVONNA 08/20/24 1901    Towana Ozell BROCKS, MD 08/21/24 1010

## 2024-08-21 ENCOUNTER — Encounter: Admitting: Family Medicine

## 2024-08-21 ENCOUNTER — Ambulatory Visit: Payer: Self-pay

## 2024-08-21 NOTE — Telephone Encounter (Signed)
 FYI Only or Action Required?: Action required by provider: request for appointment. Please see note.  Patient was last seen in primary care on 05/02/2024 by Antonetta Rollene BRAVO, MD.  Called Nurse Triage reporting Diarrhea. And weakness  Symptoms began today.  Interventions attempted: Nothing.  Symptoms are: unknown.  Triage Disposition: See HCP Within 4 Hours (Or PCP Triage)  Patient/caregiver understands and will follow disposition?: No                    Copied from CRM #[id number]. Topic: Clinical - Red Word Triage >> Aug 21, 2024 11:11 AM Shanda MATSU wrote: Red Word that prompted transfer to Nurse Triage: Patient's daughter, Roselyn McCain, calling to report that yesterday patient was taken to the ER for shakes similar to a seizure and sharp pains all throughout her body, patient is experiencing diarrhea and weakness on today. Reason for Disposition  [1] SEVERE diarrhea (e.g., 7 or more times / day more than normal) AND [2] age > 60 years  Answer Assessment - Initial Assessment Questions Call from daughter - Roselyn. Pt was seen at hospital yesterday in ED and released. Today pt is having diarrhea and weakness.  I spoke with daughter who doe not live with pt. Recommended to be seen today. Daughter stated that pt will not come in today d/t diarrhea. Wanted an appt for the 19th or 20th in the afternoon for pt. Daughter is on her way to see pt and will call ems if needed.    1. DIARRHEA SEVERITY: How bad is the diarrhea? How many more stools have you had in the past 24 hours than normal?      Unsure 2. ONSET: When did the diarrhea begin?      today 3. STOOL DESCRIPTION:  How loose or watery is the diarrhea? What is the stool color? Is there any blood or mucous in the stool?     unknown 11. OTHER SYMPTOMS: Do you have any other symptoms? (e.g., fever, blood in stool)       Weakness  Protocols used: Diarrhea-A-AH

## 2024-08-21 NOTE — Telephone Encounter (Signed)
 scheduled

## 2024-08-23 ENCOUNTER — Encounter: Payer: Self-pay | Admitting: Family Medicine

## 2024-08-23 ENCOUNTER — Ambulatory Visit (INDEPENDENT_AMBULATORY_CARE_PROVIDER_SITE_OTHER): Admitting: Family Medicine

## 2024-08-23 VITALS — BP 124/72 | HR 70 | Resp 16

## 2024-08-23 DIAGNOSIS — F411 Generalized anxiety disorder: Secondary | ICD-10-CM

## 2024-08-23 DIAGNOSIS — H9193 Unspecified hearing loss, bilateral: Secondary | ICD-10-CM

## 2024-08-23 DIAGNOSIS — H6123 Impacted cerumen, bilateral: Secondary | ICD-10-CM

## 2024-08-23 DIAGNOSIS — E7849 Other hyperlipidemia: Secondary | ICD-10-CM

## 2024-08-23 DIAGNOSIS — G44209 Tension-type headache, unspecified, not intractable: Secondary | ICD-10-CM

## 2024-08-23 DIAGNOSIS — Z09 Encounter for follow-up examination after completed treatment for conditions other than malignant neoplasm: Secondary | ICD-10-CM | POA: Diagnosis not present

## 2024-08-23 DIAGNOSIS — I10 Essential (primary) hypertension: Secondary | ICD-10-CM

## 2024-08-23 DIAGNOSIS — Z23 Encounter for immunization: Secondary | ICD-10-CM | POA: Diagnosis not present

## 2024-08-23 NOTE — Patient Instructions (Addendum)
 Annual exam in 4 months  Flu vaccine today  You are referred to ENT for hearing evaluation and removal of ear wax  CAREFUL NOT TO FALL  KEEP EATING WELL  TRY TO GET MORE SLEEP/ REST  ALL THE BEST FOR THE SEASON AND 2026!

## 2024-08-23 NOTE — Progress Notes (Signed)
         Candace Cervantes     MRN: [id number]      DOB: [birth year]  Chief Complaint  Patient presents with   Follow-up    Seen in ED yesterday for headache and shaking    HPI Candace Cervantes is here for follow up of recent ED visit 3 days ago when she presented wit generalized headache and tremorBrain scan and labs were unremarkable  Information from visit is discussed with her daughter and all questions answered C/o total hearing loss 1 week ago, Candace Cervantes refuses hearing aids over the years , will refer to ent for evaluation and wax removal   ROS Denies recent fever or chills. Denies sinus pressure, nasal congestion, ear pain or sore throat. Denies chest congestion, productive cough or wheezing. Denies  leg swelling Denies abdominal pain, nausea, vomiting,diarrhea or constipation.   C/o joint pain,  and limitation in mobility. interrmittent  headaches, denies  numbness, or tingling. C/o  anxiety or insomnia. Denies skin break down or rash.   PE  BP 124/72   Pulse 70   Resp 16   SpO2 96%    Patient alert and oriented and in no cardiopulmonary distress.  HEENT: No facial asymmetry, EOMI,     Neck decreased ROM Chest: Clear to auscultation bilaterally.  CVS: S1, S2 no murmurs, no S3.Regular rate.  ABD: Soft non tender.   Ext: No edema  FD:izrmzjdzi  ROM spine, shoulders, hips and knees.  Skin: Intact, no ulcerations or rash noted.  Psych: Good eye contact, normal affect. Memory impaired  not anxious or depressed appearing.  CNS: CN 2-12 intact, power,  normal throughout.no focal deficits noted.   Assessment & Plan  Encounter for examination following treatment at hospital Patient in for follow up of recent hospitalization. Discharge summary, and laboratory and radiology data are reviewed, and any questions or concerns  are discussed. Specific issues requiring follow up are specifically addressed.   Hearing loss Report of 1 week h/o total loss of hearing, refer  ENT for evaluation, may benefit from removal of cerumen though not excessive on exam  Bilateral impacted cerumen Refer ENT for removal and hearing assesment  Acute non intractable tension-type headache resolved  Influenza vaccination administered at current visit After obtaining informed consent, the influenza  vaccine is  administered , with no adverse effect noted at the time of administration.   GAD (generalized anxiety disorder) Continue current medication, dooes have breakthrough bouts of anxiety  Essential hypertension Controlled, no change in medication   Hyperlipemia Hyperlipidemia:Low fat diet discussed and encouraged.   Lipid Panel  Lab Results  Component Value Date   CHOL 165 12/31/2023   HDL 80 12/31/2023   LDLCALC 74 12/31/2023   TRIG 56 12/31/2023   CHOLHDL 2.1 12/31/2023     Controlled, no change in medication

## 2024-08-28 ENCOUNTER — Encounter: Payer: Self-pay | Admitting: Family Medicine

## 2024-08-28 DIAGNOSIS — Z23 Encounter for immunization: Secondary | ICD-10-CM | POA: Insufficient documentation

## 2024-08-28 NOTE — Assessment & Plan Note (Addendum)
 resolved

## 2024-08-28 NOTE — Assessment & Plan Note (Signed)
 After obtaining informed consent, the influenza vaccine is  administered , with no adverse effect noted at the time of administration.

## 2024-08-28 NOTE — Assessment & Plan Note (Signed)
 Report of 1 week h/o total loss of hearing, refer ENT for evaluation, may benefit from removal of cerumen though not excessive on exam

## 2024-08-28 NOTE — Assessment & Plan Note (Signed)
 Refer ENT for removal and hearing assesment

## 2024-08-28 NOTE — Assessment & Plan Note (Signed)
 Hyperlipidemia:Low fat diet discussed and encouraged.   Lipid Panel  Lab Results  Component Value Date   CHOL 165 12/31/2023   HDL 80 12/31/2023   LDLCALC 74 12/31/2023   TRIG 56 12/31/2023   CHOLHDL 2.1 12/31/2023     Controlled, no change in medication

## 2024-08-28 NOTE — Assessment & Plan Note (Signed)
 Controlled, no change in medication

## 2024-08-28 NOTE — Assessment & Plan Note (Signed)
Patient in for follow up of recent hospitalization. Discharge summary, and laboratory and radiology data are reviewed, and any questions or concerns  are discussed. Specific issues requiring follow up are specifically addressed.  

## 2024-08-28 NOTE — Assessment & Plan Note (Signed)
 Continue current medication, dooes have breakthrough bouts of anxiety

## 2024-08-30 ENCOUNTER — Other Ambulatory Visit: Payer: Self-pay | Admitting: Family Medicine

## 2024-08-30 NOTE — Telephone Encounter (Unsigned)
 Copied from CRM #[id number]. Topic: Clinical - Medication Refill >> Aug 30, 2024 12:22 PM Ivette P wrote: Medication: nitroGLYCERIN  (NITROSTAT ) 0.4 MG SL tablet  Has the patient contacted their pharmacy? Yes (Agent: If no, request that the patient contact the pharmacy for the refill. If patient does not wish to contact the pharmacy document the reason why and proceed with request.) (Agent: If yes, when and what did the pharmacy advise?)  This is the patient's preferred pharmacy:  [facility] - [place], [place] - [address] [place] [address] Phone: [phone] Fax: [phone]  Is this the correct pharmacy for this prescription? Yes If no, delete pharmacy and type the correct one.   Has the prescription been filled recently? No  Is the patient out of the medication? Yes  Has the patient been seen for an appointment in the last year OR does the patient have an upcoming appointment? Yes  Can we respond through MyChart? No  Agent: Please be advised that Rx refills may take up to 3 business days. We ask that you follow-up with your pharmacy.

## 2024-09-11 ENCOUNTER — Other Ambulatory Visit: Payer: Self-pay | Admitting: Family Medicine

## 2024-09-11 NOTE — Telephone Encounter (Unsigned)
 Copied from CRM [id number]. Topic: Clinical - Medication Refill >> Sep 11, 2024  8:53 AM Candace Cervantes wrote: Medication:  nitroGLYCERIN  (NITROSTAT ) 0.4 MG SL tablet   Has the patient contacted their pharmacy? Yes (Agent: If no, request that the patient contact the pharmacy for the refill. If patient does not wish to contact the pharmacy document the reason why and proceed with request.) (Agent: If yes, when and what did the pharmacy advise?)  This is the patient's preferred pharmacy:  [facility] - [place], [place] - [address] [place] [address] Phone: [phone] Fax: [phone]  Is this the correct pharmacy for this prescription? Yes If no, delete pharmacy and type the correct one.   Has the prescription been filled recently? Yes  Is the patient out of the medication? Yes  Has the patient been seen for an appointment in the last year OR does the patient have an upcoming appointment? Yes  Can we respond through MyChart? Yes  Agent: Please be advised that Rx refills may take up to 3 business days. We ask that you follow-up with your pharmacy.

## 2024-09-13 ENCOUNTER — Encounter: Admitting: Family Medicine

## 2024-09-14 NOTE — Telephone Encounter (Signed)
 Pt's daughter called to report that the patient is completely out and that the pharmacy has yet to receive this prescription from the clinic. Please advise

## 2024-09-16 ENCOUNTER — Other Ambulatory Visit: Payer: Self-pay | Admitting: Family Medicine

## 2024-09-18 ENCOUNTER — Ambulatory Visit (INDEPENDENT_AMBULATORY_CARE_PROVIDER_SITE_OTHER): Admitting: Physician Assistant

## 2024-09-18 ENCOUNTER — Encounter (INDEPENDENT_AMBULATORY_CARE_PROVIDER_SITE_OTHER): Payer: Self-pay | Admitting: Physician Assistant

## 2024-09-18 VITALS — BP 130/70 | HR 63 | Ht 66.0 in | Wt 157.0 lb

## 2024-09-18 DIAGNOSIS — H6123 Impacted cerumen, bilateral: Secondary | ICD-10-CM

## 2024-09-18 DIAGNOSIS — H9193 Unspecified hearing loss, bilateral: Secondary | ICD-10-CM

## 2024-09-19 ENCOUNTER — Telehealth: Payer: Self-pay | Admitting: Internal Medicine

## 2024-09-19 ENCOUNTER — Telehealth: Payer: Self-pay

## 2024-09-19 MED ORDER — NITROGLYCERIN 0.4 MG SL SUBL: 0.4000 mg | SUBLINGUAL_TABLET | SUBLINGUAL | 0 refills | Status: AC | PRN

## 2024-09-19 NOTE — Telephone Encounter (Signed)
°*  STAT* If patient is at the pharmacy, call can be transferred to refill team.   1. Which medications need to be refilled? (please list name of each medication and dose if known)  nitroGLYCERIN  (NITROSTAT ) 0.4 MG SL tablet   2. Would you like to learn more about the convenience, safety, & potential cost savings by using the [facility]? no   3. Are you open to using the [facility] (Type [facility]. no   4. Which pharmacy/location (including street and city if local pharmacy) is medication to be sent to? [facility] - [place], [place] - [address]    5. Do they need a 30 day or 90 day supply?    Pt completely out.

## 2024-09-19 NOTE — Progress Notes (Signed)
 Dear Dr. Antonetta, Here is my assessment for our mutual patient, Candace Cervantes. Thank you for allowing me the opportunity to care for your patient. Please do not hesitate to contact me should you have any other questions. Sincerely, Chyrl Cohen PA-C  Otolaryngology Clinic Note Referring provider: Dr. Antonetta HPI:  Candace Cervantes is a 89 y.o. female kindly referred by Dr. Antonetta   Discussed the use of AI scribe software for clinical note transcription with the patient, who gave verbal consent to proceed.  History of Present Illness   Candace Cervantes is a 89 year old female who presents with complete hearing loss over the past three weeks. She was referred by her primary doctor for evaluation of ear wax buildup and hearing loss.  Her history is obtained by her daughter who is present today.  She has experienced complete hearing loss for the past three weeks. Prior to this, she had some difficulty hearing but was able to hear a little. Her hearing has progressively worsened, and she is now unable to hear even when people yell. Her daughter reports that she has been using written communication to convey messages to her.  She has a history of being advised to use hearing aids, but she has declined due to her advanced age and the perception that it would be a waste of money. Her daughter mentions that she has been full of ear wax, which may be contributing to her hearing difficulties.  Her daughter reports that she had her hearing tested approximately a year ago at a facility near [address], possibly [facility] or another engineer, water. The daughter also mentions that she stays with her sister, who has suggested trying hearing aids, but she remains hesitant.  Her daughter expresses the difficulty of communicating with her due to the hearing loss, especially during family gatherings like Christmas.           Independent Review of Additional Tests or Records:   None   PMH/Meds/All/SocHx/FamHx/ROS:   Past Medical History:  Diagnosis Date   Anxiety disorder    Arthritis    CAD (coronary artery disease) 2005   Nonobstructive 40% RCA lesion and normal ejection fraction at cath    Chronic headache disorder 10/09/2017   GERD (gastroesophageal reflux disease)    Hyperlipidemia    Hypertension    Osteoporosis    Pacemaker 2002   Permanent Placement   Pacemaker 2007   Generator Changed    Prediabetes    Sinoatrial node dysfunction (HCC)    SSS (sick sinus syndrome) (HCC)    Zenker diverticulum    previously evaluated by Dr. Arlana in 2008.      Past Surgical History:  Procedure Laterality Date   CHOLECYSTECTOMY     COLONOSCOPY  2003   no polyps.   CYSTECTOMY     from back of the neck   ESOPHAGOGASTRODUODENOSCOPY  2008   Dr. Harvey, difficulty passing scope through UES, incomplete fibrous ring at GEJ, hh, multiple benign gastri polyps   ESOPHAGOGASTRODUODENOSCOPY N/A 10/17/2013   ZENKER'S, STRICTURE: 10-12.8 MM,  NSAID GASTRITIS FG POLYPS   ESOPHAGOGASTRODUODENOSCOPY (EGD) WITH ESOPHAGEAL DILATION N/A 10/30/2013   SLF: 1. Zenkers diverticulum with a large opening at the cricopharyngeus 2. 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 Innumerable Fundic gland polyps 4. Mild NSAID gastritis   MALONEY DILATION N/A 10/17/2013   Procedure: MALONEY DILATION;  Surgeon: Margo LITTIE Harvey, MD;  Location: [facility];  Service: Endoscopy;  Laterality: N/A;  with PEDS GASTROSCOPE  PACEMAKER INSERTION  2002   PACEMAKER PLACEMENT  2007   replacement    PPM GENERATOR CHANGEOUT N/A 01/15/2020   Procedure: PPM GENERATOR CHANGEOUT;  Surgeon: Waddell Danelle ORN, MD;  Location: [facility] INVASIVE CV LAB;  Service: Cardiovascular;  Laterality: N/A;   SAVORY DILATION N/A 10/17/2013   Procedure: SAVORY DILATION;  Surgeon: Margo LITTIE Haddock, MD;  Location: [facility];  Service: Endoscopy;  Laterality: N/A;  with PEDS GASTROSCOPE   TOTAL ABDOMINAL HYSTERECTOMY  2002    Family  History  Problem Relation Age of Onset   Heart disease Mother    Lung disease Sister    Diabetes Sister    Alcohol abuse Brother    Cancer Brother        possible prostate   Colon cancer Other        sibling, age 1   Heart attack Other        uncle     Social Connections: Moderately Integrated (10/11/2023)   Social Connection and Isolation Panel    Frequency of Communication with Friends and Family: More than three times a week    Frequency of Social Gatherings with Friends and Family: More than three times a week    Attends Religious Services: More than 4 times per year    Active Member of [employer] or Organizations: Yes    Attends Banker Meetings: More than 4 times per year    Marital Status: Widowed     Current Medications[1]   Physical Exam:   BP 130/70   Pulse 63   Ht 5' 6 (1.676 m)   Wt 157 lb (71.2 kg)   SpO2 93%   BMI 25.34 kg/m   Pertinent Findings  CN II-XII grossly intact Bilateral EAC's are very narrow, cerumen impaction bilateral Anterior rhinoscopy: Septum midline; bilateral inferior turbinates with no hypertrophy No lesions of oral cavity/oropharynx No obviously palpable neck masses/lymphadenopathy/thyromegaly No respiratory distress or stridor   Seprately Identifiable Procedures:  Procedure: Bilateral ear microscopy and cerumen removal using microscope (CPT [phone]) - Mod 50 Pre-procedure diagnosis: bilateral cerumen impaction external auditory canals Post-procedure diagnosis: same Indication: bilateral cerumen impaction; given patient's otologic complaints and history as well as for improved and comprehensive examination of external ear and tympanic membrane, bilateral otologic examination using microscope was performed and impacted cerumen removed  Procedure: Patient was placed semi-recumbent. Both ear canals were examined using the microscope with findings above. Cerumen removed from bilateral external auditory canals using suction and  currette with improvement in EAC examination and patency. Left: EAC was patent. TM was intact . Middle ear was aerated. Drainage: none Right: EAC was patent. TM was intact . Middle ear was aerated . Drainage: none Patient tolerated the procedure well.   Impression & Plans:  Candace Cervantes is a 89 y.o. female with the following   Assessment and Plan    Impacted cerumen, bilateral Bilateral impacted cerumen causing significant hearing impairment. Narrow ear canals complicate removal. Immediate improvement post-removal. - Scheduled ear cleaning every three months. - Advised follow-up with audiologist for hearing aid evaluation if desired.  Patient does not wish to pursue hearing aids.          - f/u 36-month follow-up   Thank you for allowing me the opportunity to care for your patient. Please do not hesitate to contact me should you have any other questions.  Sincerely, Chyrl Cohen PA-C [facility] Phone: [phone] Fax: [phone]  09/19/2024, 9:20 AM        [  1]  Current Outpatient Medications:    Acetaminophen  (TYLENOL  8 HOUR ARTHRITIS PAIN PO), Take 1 tablet by mouth daily. Sometimes takes 1 extra per day, Disp: , Rfl:    amLODipine  (NORVASC ) 10 MG tablet, TAKE ONE TABLET BY MOUTH ONCE DAILY., Disp: 30 tablet, Rfl: 2   citalopram  (CELEXA ) 10 MG tablet, Take 1 tablet (10 mg total) by mouth daily., Disp: 30 tablet, Rfl: 5   donepezil  (ARICEPT ) 5 MG tablet, Take 1 tablet (5 mg total) by mouth at bedtime., Disp: 30 tablet, Rfl: 2   dorzolamide-timolol (COSOPT) 22.3-6.8 MG/ML ophthalmic solution, 1 drop 2 (two) times daily., Disp: , Rfl:    Famotidine  (ACID CONTROLLER PO), Take 10 mg by mouth daily., Disp: , Rfl:    hydrochlorothiazide  (MICROZIDE ) 12.5 MG capsule, TAKE 1 CAPSULE BY MOUTH ONCE DAILY., Disp: 30 capsule, Rfl: 2   isosorbide  mononitrate (IMDUR ) 30 MG 24 hr tablet, Take one tablet by mouth once daily, Disp: 30 tablet, Rfl: 3   latanoprost  (XALATAN) 0.005 % ophthalmic solution, 1 drop daily., Disp: , Rfl:    loratadine  (CLARITIN ) 10 MG tablet, Take 1 tablet (10 mg total) by mouth daily as needed for allergies., Disp: 30 tablet, Rfl: 1   LYCOPENE PO, Take by mouth., Disp: , Rfl:    Multiple Vitamin (MULTIVITAMIN WITH MINERALS) TABS tablet, Take 1 tablet by mouth daily., Disp: , Rfl:    nitroGLYCERIN  (NITROSTAT ) 0.4 MG SL tablet, DISSOLVE 1 TABLET SUBLINGUALLY AS NEEDED FOR CHEST PAIN, MAY REPEAT EVERY 5 MINUTES. AFTER 3 TABLETS CALL 911., Disp: 25 tablet, Rfl: 0   omeprazole  (PRILOSEC) 20 MG capsule, Take 1 capsule (20 mg total) by mouth daily., Disp: 30 capsule, Rfl: 5   potassium chloride  SA (KLOR-CON  M) 20 MEQ tablet, TAKE 1 TABLET BY MOUTH DAILY, Disp: 30 tablet, Rfl: 2   pravastatin  (PRAVACHOL ) 40 MG tablet, Take 1 tablet (40 mg total) by mouth daily., Disp: 100 tablet, Rfl: 3   triamterene -hydrochlorothiazide  (MAXZIDE -25) 37.5-25 MG tablet, Take 1 tablet by mouth daily., Disp: , Rfl:

## 2024-09-19 NOTE — Telephone Encounter (Signed)
 Copied from CRM #[id number]. Topic: Clinical - Prescription Issue >> Sep 19, 2024 10:41 AM Joesph NOVAK wrote: Reason for CRM: patients daughter has been requesting this medication (nitroGLYCERIN  (NITROSTAT ) 0.4 MG SL tablet [[id number]]) for over a week now and still has not been sent in. She would like to know why this is not being filled by provider. Please fu with pt's daughter, [phone]. Roslyn. >> Sep 19, 2024 10:49 AM Joesph B wrote: Please disregard this. Advised her to contact her cardiologist for the medication,

## 2024-09-19 NOTE — Telephone Encounter (Signed)
 Refill sent

## 2024-09-25 ENCOUNTER — Encounter: Payer: Self-pay | Admitting: Podiatry

## 2024-09-25 ENCOUNTER — Ambulatory Visit: Admitting: Podiatry

## 2024-09-25 DIAGNOSIS — M79675 Pain in left toe(s): Secondary | ICD-10-CM | POA: Diagnosis not present

## 2024-09-25 DIAGNOSIS — Q828 Other specified congenital malformations of skin: Secondary | ICD-10-CM | POA: Diagnosis not present

## 2024-09-25 DIAGNOSIS — I739 Peripheral vascular disease, unspecified: Secondary | ICD-10-CM | POA: Diagnosis not present

## 2024-09-25 NOTE — Progress Notes (Unsigned)
"  °  Subjective:  Patient ID: Candace Cervantes, female    DOB: 01-Mar-1925,  MRN: [id number]  Candace Cervantes presents to clinic today for {jgcomplaint:23593}  Chief Complaint  Patient presents with   Nail Problem    She denies being diabetic. She saw Dr. Antonetta in Nov.   New problem(s): None.   PCP is Candace Rollene BRAVO, MD.  Allergies[1]  Review of Systems: Negative except as noted in the HPI.  Objective: No changes noted in today's physical examination. There were no vitals filed for this visit. Candace Cervantes is a pleasant 89 y.o. female WD, WN in NAD. AAO x 3.  Vascular Examination: Capillary refill time <3 seconds b/l LE. Faintly palpable pedal pulses b/l LE. Digital hair present b/l. No pedal edema b/l. Skin temperature gradient WNL b/l. No varicosities b/l. SABRA  Dermatological Examination: Pedal skin with normal turgor, texture and tone b/l. No open wounds. No interdigital macerations b/l. Toenails 1-5 b/l thickened, discolored, dystrophic with subungual debris. There is pain on palpation to dorsal aspect of nailplates.   Incurvated nailplate right great toe lateral border(s) with tenderness to palpation. No erythema, no edema, no drainage noted.   Porokeratotic lesion(s) dorsal PIPJ bilateral 5th toes.  No erythema, no edema, no drainage, no fluctuance..  Neurological Examination: Protective sensation intact with 10 gram monofilament b/l LE. Vibratory sensation intact b/l LE.   Musculoskeletal Examination: Normal muscle strength 5/5 to all lower extremity muscle groups bilaterally. No pain, crepitus or joint limitation noted with ROM b/l LE. HAV with bunion bilaterally and hammertoes 2-5 b/l.   Assessment/Plan: 1. Pain in toe of left foot   2. Porokeratosis   3. PAD (peripheral artery disease)     No orders of the defined types were placed in this encounter.   None {Jgplan:23602::-Patient/POA to call should there be question/concern in the interim.}   Return in about  3 months (around 12/24/2024).  Delon LITTIE Merlin, DPM      [facility] LOCATION: [address] [place] [address]                   Office [phone]   [facility] LOCATION: [address] [place], [place] [address] Office [phone]     [1] No Known Allergies  "

## 2024-10-06 ENCOUNTER — Ambulatory Visit: Payer: Self-pay | Admitting: Family Medicine

## 2024-10-11 ENCOUNTER — Ambulatory Visit: Payer: Medicare Other

## 2024-10-11 VITALS — Ht 66.0 in | Wt 162.0 lb

## 2024-10-11 DIAGNOSIS — Z0000 Encounter for general adult medical examination without abnormal findings: Secondary | ICD-10-CM

## 2024-10-11 NOTE — Progress Notes (Signed)
 "  No voiced or noted concerns at this time. Chief Complaint  Patient presents with   Medicare Wellness     Subjective:   Candace Cervantes is a 89 y.o. female who presents for a Medicare Annual Wellness Visit.  Visit info / Clinical Intake: Medicare Wellness Visit Type:: Subsequent Annual Wellness Visit Persons participating in visit and providing information:: patient & caregiver Medicare Wellness Visit Mode:: Telephone If telephone:: video error Since this visit was completed virtually, some vitals may be partially provided or unavailable. Missing vitals are due to the limitations of the virtual format.: Documented vitals are patient reported If Telephone or Video please confirm:: I connected with patient using audio/video enable telemedicine. I verified patient identity with two identifiers, discussed telehealth limitations, and patient agreed to proceed. Patient Location:: home Provider Location:: office Interpreter Needed?: No Pre-visit prep was completed: yes AWV questionnaire completed by patient prior to visit?: no Living arrangements:: with family/others Patient's Overall Health Status Rating: very good Typical amount of pain: none Does pain affect daily life?: no Are you currently prescribed opioids?: no  Dietary Habits and Nutritional Risks How many meals a day?: 3 Eats fruit and vegetables daily?: yes Most meals are obtained by: having others provide food In the last 2 weeks, have you had any of the following?: none Diabetic:: no  Functional Status Activities of Daily Living (to include ambulation/medication): (!) Needs Assist Feeding: Independent Dressing/Grooming: Independent Bathing: Independent (daughter assist patient with getting in and out of bath tub but patient can bath alone) Toileting: Independent Transfer: Independent with device- listed below Ambulation: Independent with device- listed below Home Assistive Devices/Equipment: Walker (specify  Type) Medication Administration: Needs assistance (comment) (daughter assist with medications and reminds patient when to take them) Is this a change from baseline?: Pre-admission baseline Home Management (perform basic housework or laundry): Dependent Manage your own finances?: (!) no Primary transportation is: family / friends Concerns about vision?: no *vision screening is required for WTM* Concerns about hearing?: no  Fall Screening Falls in the past year?: 1 Number of falls in past year: 1 Was there an injury with Fall?: 0 Fall Risk Category Calculator: 2 Patient Fall Risk Level: Moderate Fall Risk  Fall Risk Patient at Risk for Falls Due to: History of fall(s); Impaired balance/gait; Impaired mobility Fall risk Follow up: Falls evaluation completed; Education provided; Falls prevention discussed  Home and Transportation Safety: All rugs have non-skid backing?: yes All stairs or steps have railings?: yes Grab bars in the bathtub or shower?: yes Have non-skid surface in bathtub or shower?: yes Good home lighting?: yes Regular seat belt use?: yes Hospital stays in the last year:: no  Cognitive Assessment Difficulty concentrating, remembering, or making decisions? : no Will 6CIT or Mini Cog be Completed: yes What year is it?: 0 points What month is it?: 0 points Give patient an address phrase to remember (5 components): [address] [place] About what time is it?: 0 points Count backwards from 20 to 1: 0 points Say the months of the year in reverse: 0 points Repeat the address phrase from earlier: 0 points 6 CIT Score: 0 points  Advance Directives (For Healthcare) Does Patient Have a Medical Advance Directive?: No Would patient like information on creating a medical advance directive?: Yes (MAU/Ambulatory/Procedural Areas - Information given)  Reviewed/Updated  Reviewed/Updated: Reviewed All (Medical, Surgical, Family, Medications, Allergies, Care Teams, Patient  Goals)    Allergies (verified) Patient has no known allergies.   Current Medications (verified) Outpatient  Encounter Medications as of 10/11/2024  Medication Sig   Acetaminophen  (TYLENOL  8 HOUR ARTHRITIS PAIN PO) Take 1 tablet by mouth daily. Sometimes takes 1 extra per day   amLODipine  (NORVASC ) 10 MG tablet TAKE ONE TABLET BY MOUTH ONCE DAILY.   citalopram  (CELEXA ) 10 MG tablet Take 1 tablet (10 mg total) by mouth daily.   donepezil  (ARICEPT ) 5 MG tablet Take 1 tablet (5 mg total) by mouth at bedtime.   dorzolamide-timolol (COSOPT) 22.3-6.8 MG/ML ophthalmic solution 1 drop 2 (two) times daily.   Famotidine  (ACID CONTROLLER PO) Take 10 mg by mouth daily.   hydrochlorothiazide  (MICROZIDE ) 12.5 MG capsule TAKE 1 CAPSULE BY MOUTH ONCE DAILY.   isosorbide  mononitrate (IMDUR ) 30 MG 24 hr tablet Take one tablet by mouth once daily   latanoprost (XALATAN) 0.005 % ophthalmic solution 1 drop daily.   loratadine  (CLARITIN ) 10 MG tablet Take 1 tablet (10 mg total) by mouth daily as needed for allergies.   LYCOPENE PO Take by mouth.   Multiple Vitamin (MULTIVITAMIN WITH MINERALS) TABS tablet Take 1 tablet by mouth daily.   nitroGLYCERIN  (NITROSTAT ) 0.4 MG SL tablet Place 1 tablet (0.4 mg total) under the tongue every 5 (five) minutes as needed for chest pain.   omeprazole  (PRILOSEC) 20 MG capsule Take 1 capsule (20 mg total) by mouth daily.   potassium chloride  SA (KLOR-CON  M) 20 MEQ tablet TAKE 1 TABLET BY MOUTH DAILY   pravastatin  (PRAVACHOL ) 40 MG tablet Take 1 tablet (40 mg total) by mouth daily.   triamterene -hydrochlorothiazide  (MAXZIDE -25) 37.5-25 MG tablet Take 1 tablet by mouth daily.   No facility-administered encounter medications on file as of 10/11/2024.    History: Past Medical History:  Diagnosis Date   Anxiety disorder    Arthritis    CAD (coronary artery disease) 2005   Nonobstructive 40% RCA lesion and normal ejection fraction at cath    Chronic headache disorder 10/09/2017    GERD (gastroesophageal reflux disease)    Hyperlipidemia    Hypertension    Osteoporosis    Pacemaker 2002   Permanent Placement   Pacemaker 2007   Generator Changed    Prediabetes    Sinoatrial node dysfunction (HCC)    SSS (sick sinus syndrome) (HCC)    Zenker diverticulum    previously evaluated by Dr. Arlana in 2008.    Past Surgical History:  Procedure Laterality Date   CHOLECYSTECTOMY     COLONOSCOPY  2003   no polyps.   CYSTECTOMY     from back of the neck   ESOPHAGOGASTRODUODENOSCOPY  2008   Dr. Harvey, difficulty passing scope through UES, incomplete fibrous ring at GEJ, hh, multiple benign gastri polyps   ESOPHAGOGASTRODUODENOSCOPY N/A 10/17/2013   ZENKER'S, STRICTURE: 10-12.8 MM,  NSAID GASTRITIS FG POLYPS   ESOPHAGOGASTRODUODENOSCOPY (EGD) WITH ESOPHAGEAL DILATION N/A 10/30/2013   SLF: 1. Zenkers diverticulum with a large opening at the cricopharyngeus 2. 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 Innumerable Fundic gland polyps 4. Mild NSAID gastritis   MALONEY DILATION N/A 10/17/2013   Procedure: MALONEY DILATION;  Surgeon: Margo LITTIE Harvey, MD;  Location: [facility];  Service: Endoscopy;  Laterality: N/A;  with PEDS GASTROSCOPE   PACEMAKER INSERTION  2002   PACEMAKER PLACEMENT  2007   replacement    PPM GENERATOR CHANGEOUT N/A 01/15/2020   Procedure: PPM GENERATOR CHANGEOUT;  Surgeon: Waddell Danelle ORN, MD;  Location: [facility] INVASIVE CV LAB;  Service: Cardiovascular;  Laterality: N/A;   SAVORY DILATION N/A 10/17/2013   Procedure: SAVORY  DILATION;  Surgeon: Margo LITTIE Haddock, MD;  Location: [facility];  Service: Endoscopy;  Laterality: N/A;  with PEDS GASTROSCOPE   TOTAL ABDOMINAL HYSTERECTOMY  2002   Family History  Problem Relation Age of Onset   Heart disease Mother    Lung disease Sister    Diabetes Sister    Alcohol abuse Brother    Cancer Brother        possible prostate   Colon cancer Other        sibling, age 39   Heart attack Other        uncle   Social  History   Occupational History   Not on file  Tobacco Use   Smoking status: Never   Smokeless tobacco: Never  Vaping Use   Vaping status: Never Used  Substance and Sexual Activity   Alcohol use: Never   Drug use: Never   Sexual activity: Not Currently   Tobacco Counseling Counseling given: Yes  SDOH Screenings   Food Insecurity: No Food Insecurity (10/11/2024)  Housing: Low Risk (10/11/2024)  Transportation Needs: No Transportation Needs (10/11/2024)  Utilities: Not At Risk (10/11/2024)  Alcohol Screen: Low Risk (10/11/2023)  Depression (PHQ2-9): Low Risk (10/11/2024)  Financial Resource Strain: Low Risk (10/11/2023)  Physical Activity: Inactive (10/11/2024)  Social Connections: Moderately Integrated (10/11/2024)  Stress: No Stress Concern Present (10/11/2024)  Tobacco Use: Low Risk (10/11/2024)  Health Literacy: Adequate Health Literacy (10/11/2024)   See flowsheets for full screening details  Depression Screen PHQ 2 & 9 Depression Scale- Over the past 2 weeks, how often have you been bothered by any of the following problems? Little interest or pleasure in doing things: 0 Feeling down, depressed, or hopeless (PHQ Adolescent also includes...irritable): 0 PHQ-2 Total Score: 0 Trouble falling or staying asleep, or sleeping too much: 0 Feeling tired or having little energy: 0 Poor appetite or overeating (PHQ Adolescent also includes...weight loss): 0 Feeling bad about yourself - or that you are a failure or have let yourself or your family down: 0 Trouble concentrating on things, such as reading the newspaper or watching television (PHQ Adolescent also includes...like school work): 0 Moving or speaking so slowly that other people could have noticed. Or the opposite - being so fidgety or restless that you have been moving around a lot more than usual: 0 Thoughts that you would be better off dead, or of hurting yourself in some way: 0 PHQ-9 Total Score: 0 If you checked off any problems, how  difficult have these problems made it for you to do your work, take care of things at home, or get along with other people?: Somewhat difficult  Depression Treatment Depression Interventions/Treatment : EYV7-0 Score <4 Follow-up Not Indicated     Goals Addressed               This Visit's Progress     I want to increase my activity (pt-stated)               Objective:    Today's Vitals   10/11/24 1034  Weight: 162 lb (73.5 kg)  Height: 5' 6 (1.676 m)   Body mass index is 26.15 kg/m.  Hearing/Vision screen Hearing Screening - Comments:: Patient have a lot of difficulty hearing. Recently had wax removed from ears and was able to hear better. Is not scheduled for routine visits every 3 months.   Vision Screening - Comments:: Patient wears reading glasses only. Up to date with yearly exams.  Dr. Groat Immunizations  and Health Maintenance Health Maintenance  Topic Date Due   DTaP/Tdap/Td (3 - Td or Tdap) 08/17/2021   COVID-19 Vaccine (5 - 2025-26 season) 06/05/2024   Medicare Annual Wellness (AWV)  10/10/2024   Pneumococcal Vaccine: 50+ Years  Completed   Influenza Vaccine  Completed   Bone Density Scan  Completed   Zoster Vaccines- Shingrix  Completed   Meningococcal B Vaccine  Aged Out        Assessment/Plan:  This is a routine wellness examination for Danila.  Patient Care Team: Antonetta Rollene BRAVO, MD as PCP - Diedre Octavia Charleston, MD as Referring Physician (Ophthalmology) Jerilynn Lamarr HERO, NP as Nurse Practitioner (Cardiology) Waddell Danelle ORN, MD as Consulting Physician (Cardiology) Gaynel Delon CROME, DPM as Consulting Physician (Podiatry) Hedges, Reyes RIGGERS as Physician Assistant (Otolaryngology)  I have personally reviewed and noted the following in the patients chart:   Medical and social history Use of alcohol, tobacco or illicit drugs  Current medications and supplements including opioid prescriptions. Functional ability and  status Nutritional status Physical activity Advanced directives List of other physicians Hospitalizations, surgeries, and ER visits in previous 12 months Vitals Screenings to include cognitive, depression, and falls Referrals and appointments  No orders of the defined types were placed in this encounter.  In addition, I have reviewed and discussed with patient certain preventive protocols, quality metrics, and best practice recommendations. A written personalized care plan for preventive services as well as general preventive health recommendations were provided to patient.   Tremond Shimabukuro, CMA   10/11/2024   Return October 15, 2025 at 11:20 am, for In office Medicare Well Visit w  Wellness Nurse.  After Visit Summary: (Declined) Due to this being a telephonic visit, with patients personalized plan was offered to patient but patient Declined AVS at this time   "

## 2024-10-11 NOTE — Patient Instructions (Signed)
 [name],  Thank you for taking the time for your Medicare Wellness Visit. I appreciate your continued commitment to your health goals. Please review the care plan we discussed, and feel free to reach out if I can assist you further.  Please note that Annual Wellness Visits do not include a physical exam. Some assessments may be limited, especially if the visit was conducted virtually. If needed, we may recommend an in-person follow-up with your provider.  Ongoing Care Seeing your primary care provider every 3 to 6 months helps us  monitor your health and provide consistent, personalized care.   1 year follow up for Medicare well visit: October 15, 2025 at 11:20 am with medicare wellness nurse in office  Recommended Screenings:  Health Maintenance  Topic Date Due   DTaP/Tdap/Td vaccine (3 - Td or Tdap) 08/17/2021   COVID-19 Vaccine (5 - 2025-26 season) 06/05/2024   Medicare Annual Wellness Visit  10/10/2024   Pneumococcal Vaccine for age over 47  Completed   Flu Shot  Completed   Osteoporosis screening with Bone Density Scan  Completed   Zoster (Shingles) Vaccine  Completed   Meningitis B Vaccine  Aged Out       10/11/2024   10:38 AM  Advanced Directives  Does Patient Have a Medical Advance Directive? No  Would patient like information on creating a medical advance directive? Yes (MAU/Ambulatory/Procedural Areas - Information given)    Vision: Annual vision screenings are recommended for early detection of glaucoma, cataracts, and diabetic retinopathy. These exams can also reveal signs of chronic conditions such as diabetes and high blood pressure.  Dental: Annual dental screenings help detect early signs of oral cancer, gum disease, and other conditions linked to overall health, including heart disease and diabetes.  Please see the attached documents for additional preventive care recommendations.

## 2024-10-14 ENCOUNTER — Other Ambulatory Visit: Payer: Self-pay | Admitting: Family Medicine

## 2024-10-16 ENCOUNTER — Ambulatory Visit: Admitting: Physician Assistant

## 2024-11-13 ENCOUNTER — Ambulatory Visit: Payer: Medicare Other

## 2024-12-07 ENCOUNTER — Ambulatory Visit: Admitting: Physician Assistant

## 2024-12-18 ENCOUNTER — Ambulatory Visit (INDEPENDENT_AMBULATORY_CARE_PROVIDER_SITE_OTHER): Admitting: Physician Assistant

## 2024-12-19 ENCOUNTER — Ambulatory Visit: Admitting: Family Medicine

## 2024-12-25 ENCOUNTER — Ambulatory Visit: Admitting: Podiatry

## 2025-02-12 ENCOUNTER — Ambulatory Visit: Payer: Medicare Other

## 2025-05-14 ENCOUNTER — Ambulatory Visit: Payer: Medicare Other

## 2025-08-13 ENCOUNTER — Ambulatory Visit: Payer: Medicare Other

## 2025-10-15 ENCOUNTER — Ambulatory Visit: Payer: Self-pay
